# Patient Record
Sex: Male | Born: 1955 | Race: Black or African American | Hispanic: No | Marital: Single | Smoking: Former smoker
Health system: Southern US, Community
[De-identification: ages and names within clinical notes are randomized; demographics above are authoritative.]

## PROBLEM LIST (undated history)

## (undated) DIAGNOSIS — I714 Abdominal aortic aneurysm, without rupture, unspecified: Secondary | ICD-10-CM

## (undated) DIAGNOSIS — S069X9A Unspecified intracranial injury with loss of consciousness of unspecified duration, initial encounter: Secondary | ICD-10-CM

## (undated) DIAGNOSIS — R569 Unspecified convulsions: Secondary | ICD-10-CM

## (undated) DIAGNOSIS — R413 Other amnesia: Secondary | ICD-10-CM

## (undated) DIAGNOSIS — I719 Aortic aneurysm of unspecified site, without rupture: Secondary | ICD-10-CM

## (undated) DIAGNOSIS — F329 Major depressive disorder, single episode, unspecified: Secondary | ICD-10-CM

## (undated) DIAGNOSIS — IMO0001 Reserved for inherently not codable concepts without codable children: Secondary | ICD-10-CM

## (undated) DIAGNOSIS — I517 Cardiomegaly: Secondary | ICD-10-CM

## (undated) DIAGNOSIS — S069XAA Unspecified intracranial injury with loss of consciousness status unknown, initial encounter: Secondary | ICD-10-CM

## (undated) DIAGNOSIS — F1010 Alcohol abuse, uncomplicated: Secondary | ICD-10-CM

## (undated) DIAGNOSIS — M5126 Other intervertebral disc displacement, lumbar region: Secondary | ICD-10-CM

## (undated) DIAGNOSIS — Z5189 Encounter for other specified aftercare: Secondary | ICD-10-CM

## (undated) DIAGNOSIS — R3915 Urgency of urination: Secondary | ICD-10-CM

## (undated) DIAGNOSIS — I10 Essential (primary) hypertension: Secondary | ICD-10-CM

## (undated) DIAGNOSIS — M5410 Radiculopathy, site unspecified: Secondary | ICD-10-CM

## (undated) DIAGNOSIS — F419 Anxiety disorder, unspecified: Secondary | ICD-10-CM

## (undated) DIAGNOSIS — F32A Depression, unspecified: Secondary | ICD-10-CM

## (undated) DIAGNOSIS — E785 Hyperlipidemia, unspecified: Secondary | ICD-10-CM

## (undated) HISTORY — DX: Abdominal aortic aneurysm, without rupture: I71.4

## (undated) HISTORY — PX: POLYPECTOMY: SHX149

## (undated) HISTORY — PX: OTHER SURGICAL HISTORY: SHX169

## (undated) HISTORY — DX: Anxiety disorder, unspecified: F41.9

## (undated) HISTORY — DX: Abdominal aortic aneurysm, without rupture, unspecified: I71.40

## (undated) HISTORY — DX: Depression, unspecified: F32.A

## (undated) HISTORY — DX: Major depressive disorder, single episode, unspecified: F32.9

---

## 1986-06-25 HISTORY — PX: OTHER SURGICAL HISTORY: SHX169

## 1986-06-25 HISTORY — PX: LEG SURGERY: SHX1003

## 1986-06-25 HISTORY — PX: HIP SURGERY: SHX245

## 1998-01-22 ENCOUNTER — Emergency Department (HOSPITAL_COMMUNITY): Admission: EM | Admit: 1998-01-22 | Discharge: 1998-01-22 | Payer: Self-pay | Admitting: Emergency Medicine

## 1998-11-12 ENCOUNTER — Emergency Department (HOSPITAL_COMMUNITY): Admission: EM | Admit: 1998-11-12 | Discharge: 1998-11-12 | Payer: Self-pay | Admitting: *Deleted

## 2001-04-29 ENCOUNTER — Encounter: Payer: Self-pay | Admitting: Emergency Medicine

## 2001-04-29 ENCOUNTER — Emergency Department (HOSPITAL_COMMUNITY): Admission: EM | Admit: 2001-04-29 | Discharge: 2001-04-29 | Payer: Self-pay | Admitting: Emergency Medicine

## 2003-04-04 ENCOUNTER — Inpatient Hospital Stay (HOSPITAL_COMMUNITY): Admission: EM | Admit: 2003-04-04 | Discharge: 2003-04-13 | Payer: Self-pay | Admitting: Emergency Medicine

## 2003-04-04 ENCOUNTER — Encounter: Payer: Self-pay | Admitting: Emergency Medicine

## 2003-04-05 ENCOUNTER — Encounter: Payer: Self-pay | Admitting: Internal Medicine

## 2003-04-05 ENCOUNTER — Encounter: Payer: Self-pay | Admitting: Emergency Medicine

## 2003-07-26 ENCOUNTER — Emergency Department (HOSPITAL_COMMUNITY): Admission: EM | Admit: 2003-07-26 | Discharge: 2003-07-26 | Payer: Self-pay | Admitting: Emergency Medicine

## 2003-10-06 ENCOUNTER — Emergency Department (HOSPITAL_COMMUNITY): Admission: EM | Admit: 2003-10-06 | Discharge: 2003-10-07 | Payer: Self-pay | Admitting: Emergency Medicine

## 2003-12-20 ENCOUNTER — Inpatient Hospital Stay (HOSPITAL_COMMUNITY): Admission: EM | Admit: 2003-12-20 | Discharge: 2004-01-03 | Payer: Self-pay | Admitting: Emergency Medicine

## 2003-12-24 ENCOUNTER — Encounter (INDEPENDENT_AMBULATORY_CARE_PROVIDER_SITE_OTHER): Payer: Self-pay | Admitting: Interventional Cardiology

## 2004-09-21 ENCOUNTER — Ambulatory Visit: Payer: Self-pay | Admitting: Cardiology

## 2004-10-09 ENCOUNTER — Ambulatory Visit: Payer: Self-pay | Admitting: Internal Medicine

## 2004-11-02 ENCOUNTER — Ambulatory Visit: Payer: Self-pay | Admitting: Internal Medicine

## 2004-11-13 ENCOUNTER — Ambulatory Visit: Payer: Self-pay | Admitting: Cardiology

## 2004-11-15 ENCOUNTER — Ambulatory Visit: Payer: Self-pay

## 2004-11-27 ENCOUNTER — Ambulatory Visit: Payer: Self-pay | Admitting: Cardiology

## 2005-11-17 ENCOUNTER — Inpatient Hospital Stay (HOSPITAL_COMMUNITY): Admission: EM | Admit: 2005-11-17 | Discharge: 2005-11-21 | Payer: Self-pay | Admitting: Emergency Medicine

## 2007-09-09 ENCOUNTER — Emergency Department (HOSPITAL_COMMUNITY): Admission: EM | Admit: 2007-09-09 | Discharge: 2007-09-10 | Payer: Self-pay | Admitting: Emergency Medicine

## 2007-10-29 ENCOUNTER — Emergency Department (HOSPITAL_COMMUNITY): Admission: EM | Admit: 2007-10-29 | Discharge: 2007-10-29 | Payer: Self-pay | Admitting: Emergency Medicine

## 2008-10-12 ENCOUNTER — Encounter: Payer: Self-pay | Admitting: Unknown Physician Specialty

## 2008-10-12 ENCOUNTER — Emergency Department (HOSPITAL_COMMUNITY): Admission: EM | Admit: 2008-10-12 | Discharge: 2008-10-12 | Payer: Self-pay | Admitting: Emergency Medicine

## 2008-10-15 ENCOUNTER — Inpatient Hospital Stay (HOSPITAL_COMMUNITY): Admission: EM | Admit: 2008-10-15 | Discharge: 2008-10-17 | Payer: Self-pay | Admitting: Emergency Medicine

## 2010-07-16 ENCOUNTER — Encounter: Payer: Self-pay | Admitting: Unknown Physician Specialty

## 2010-07-17 ENCOUNTER — Encounter: Payer: Self-pay | Admitting: Unknown Physician Specialty

## 2010-10-04 LAB — CBC
HCT: 17.8 % — ABNORMAL LOW (ref 39.0–52.0)
HCT: 22.7 % — ABNORMAL LOW (ref 39.0–52.0)
HCT: 34.3 % — ABNORMAL LOW (ref 39.0–52.0)
Hemoglobin: 11.7 g/dL — ABNORMAL LOW (ref 13.0–17.0)
Hemoglobin: 6.3 g/dL — CL (ref 13.0–17.0)
Hemoglobin: 8 g/dL — ABNORMAL LOW (ref 13.0–17.0)
MCHC: 34.2 g/dL (ref 30.0–36.0)
MCHC: 35.1 g/dL (ref 30.0–36.0)
MCHC: 35.4 g/dL (ref 30.0–36.0)
MCV: 92.1 fL (ref 78.0–100.0)
MCV: 92.1 fL (ref 78.0–100.0)
MCV: 92.9 fL (ref 78.0–100.0)
Platelets: 108 10*3/uL — ABNORMAL LOW (ref 150–400)
Platelets: 114 10*3/uL — ABNORMAL LOW (ref 150–400)
Platelets: 123 10*3/uL — ABNORMAL LOW (ref 150–400)
RBC: 1.92 MIL/uL — ABNORMAL LOW (ref 4.22–5.81)
RBC: 2.46 MIL/uL — ABNORMAL LOW (ref 4.22–5.81)
RBC: 3.72 MIL/uL — ABNORMAL LOW (ref 4.22–5.81)
RDW: 13.9 % (ref 11.5–15.5)
RDW: 14.5 % (ref 11.5–15.5)
RDW: 14.9 % (ref 11.5–15.5)
WBC: 6.3 10*3/uL (ref 4.0–10.5)
WBC: 6.8 10*3/uL (ref 4.0–10.5)
WBC: 7.7 10*3/uL (ref 4.0–10.5)

## 2010-10-04 LAB — RETICULOCYTES
RBC.: 2.75 MIL/uL — ABNORMAL LOW (ref 4.22–5.81)
Retic Count, Absolute: 27.5 10*3/uL (ref 19.0–186.0)
Retic Ct Pct: 1 % (ref 0.4–3.1)

## 2010-10-04 LAB — TYPE AND SCREEN: Antibody Screen: NEGATIVE

## 2010-10-04 LAB — COMPREHENSIVE METABOLIC PANEL
ALT: 61 U/L — ABNORMAL HIGH (ref 0–53)
ALT: 80 U/L — ABNORMAL HIGH (ref 0–53)
ALT: 86 U/L — ABNORMAL HIGH (ref 0–53)
AST: 102 U/L — ABNORMAL HIGH (ref 0–37)
AST: 50 U/L — ABNORMAL HIGH (ref 0–37)
AST: 91 U/L — ABNORMAL HIGH (ref 0–37)
Albumin: 3.3 g/dL — ABNORMAL LOW (ref 3.5–5.2)
Albumin: 3.6 g/dL (ref 3.5–5.2)
Albumin: 3.7 g/dL (ref 3.5–5.2)
Alkaline Phosphatase: 51 U/L (ref 39–117)
Alkaline Phosphatase: 55 U/L (ref 39–117)
Alkaline Phosphatase: 63 U/L (ref 39–117)
BUN: 10 mg/dL (ref 6–23)
BUN: 15 mg/dL (ref 6–23)
BUN: 4 mg/dL — ABNORMAL LOW (ref 6–23)
CO2: 22 mEq/L (ref 19–32)
CO2: 24 mEq/L (ref 19–32)
CO2: 27 mEq/L (ref 19–32)
Calcium: 8.3 mg/dL — ABNORMAL LOW (ref 8.4–10.5)
Calcium: 8.5 mg/dL (ref 8.4–10.5)
Calcium: 8.6 mg/dL (ref 8.4–10.5)
Chloride: 101 mEq/L (ref 96–112)
Chloride: 103 mEq/L (ref 96–112)
Chloride: 104 mEq/L (ref 96–112)
Creatinine, Ser: 0.58 mg/dL (ref 0.4–1.5)
Creatinine, Ser: 0.7 mg/dL (ref 0.4–1.5)
Creatinine, Ser: 1.11 mg/dL (ref 0.4–1.5)
GFR calc Af Amer: >60 mL/min (ref >60)
GFR calc Af Amer: >60 mL/min (ref >60)
GFR calc Af Amer: >60 mL/min (ref >60)
GFR calc non Af Amer: >60 mL/min (ref >60)
GFR calc non Af Amer: >60 mL/min (ref >60)
GFR calc non Af Amer: >60 mL/min (ref >60)
Glucose, Bld: 69 mg/dL — ABNORMAL LOW (ref 70–99)
Glucose, Bld: 93 mg/dL (ref 70–99)
Glucose, Bld: 95 mg/dL (ref 70–99)
Potassium: 4.1 mEq/L (ref 3.5–5.1)
Potassium: 4.2 mEq/L (ref 3.5–5.1)
Potassium: 4.4 mEq/L (ref 3.5–5.1)
Sodium: 133 mEq/L — ABNORMAL LOW (ref 135–145)
Sodium: 135 mEq/L (ref 135–145)
Sodium: 138 mEq/L (ref 135–145)
Total Bilirubin: 0.2 mg/dL — ABNORMAL LOW (ref 0.3–1.2)
Total Bilirubin: 0.6 mg/dL (ref 0.3–1.2)
Total Bilirubin: 1 mg/dL (ref 0.3–1.2)
Total Protein: 5.8 g/dL — ABNORMAL LOW (ref 6.0–8.3)
Total Protein: 6.3 g/dL (ref 6.0–8.3)
Total Protein: 6.3 g/dL (ref 6.0–8.3)

## 2010-10-04 LAB — DIFFERENTIAL
Basophils Absolute: 0 10*3/uL (ref 0.0–0.1)
Basophils Absolute: 0 10*3/uL (ref 0.0–0.1)
Basophils Relative: 0 % (ref 0–1)
Basophils Relative: 0 % (ref 0–1)
Eosinophils Absolute: 0.1 10*3/uL (ref 0.0–0.7)
Eosinophils Absolute: 0.1 10*3/uL (ref 0.0–0.7)
Eosinophils Relative: 1 % (ref 0–5)
Eosinophils Relative: 2 % (ref 0–5)
Lymphocytes Relative: 19 % (ref 12–46)
Lymphocytes Relative: 23 % (ref 12–46)
Lymphs Abs: 1.5 10*3/uL (ref 0.7–4.0)
Lymphs Abs: 1.5 10*3/uL (ref 0.7–4.0)
Monocytes Absolute: 0.8 10*3/uL (ref 0.1–1.0)
Monocytes Absolute: 0.8 10*3/uL (ref 0.1–1.0)
Monocytes Relative: 10 % (ref 3–12)
Monocytes Relative: 13 % — ABNORMAL HIGH (ref 3–12)
Neutro Abs: 3.9 10*3/uL (ref 1.7–7.7)
Neutro Abs: 5.3 10*3/uL (ref 1.7–7.7)
Neutrophils Relative %: 62 % (ref 43–77)
Neutrophils Relative %: 69 % (ref 43–77)

## 2010-10-04 LAB — POCT I-STAT, CHEM 8
BUN: 11 mg/dL (ref 6–23)
Calcium, Ion: 0.93 mmol/L — ABNORMAL LOW (ref 1.12–1.32)
Chloride: 102 mEq/L (ref 96–112)
TCO2: 22 mmol/L (ref 0–100)

## 2010-10-04 LAB — POCT CARDIAC MARKERS
CKMB, poc: <1 ng/mL — ABNORMAL LOW (ref 1.0–8.0)
CKMB, poc: <1 ng/mL — ABNORMAL LOW (ref 1.0–8.0)
Myoglobin, poc: 28.8 ng/mL (ref 12–200)
Myoglobin, poc: 40.9 ng/mL (ref 12–200)
Myoglobin, poc: 77.4 ng/mL (ref 12–200)
Troponin i, poc: <0.05 ng/mL (ref 0.00–0.09)
Troponin i, poc: <0.05 ng/mL (ref 0.00–0.09)

## 2010-10-04 LAB — HEMOGLOBIN AND HEMATOCRIT, BLOOD
HCT: 32.6 % — ABNORMAL LOW (ref 39.0–52.0)
HCT: 33.4 % — ABNORMAL LOW (ref 39.0–52.0)
HCT: 33.4 % — ABNORMAL LOW (ref 39.0–52.0)
HCT: 34.6 % — ABNORMAL LOW (ref 39.0–52.0)
Hemoglobin: 10.7 g/dL — ABNORMAL LOW (ref 13.0–17.0)
Hemoglobin: 11.4 g/dL — ABNORMAL LOW (ref 13.0–17.0)
Hemoglobin: 11.5 g/dL — ABNORMAL LOW (ref 13.0–17.0)

## 2010-10-04 LAB — GLUCOSE, CAPILLARY
Glucose-Capillary: 149 mg/dL — ABNORMAL HIGH (ref 70–99)
Glucose-Capillary: 85 mg/dL (ref 70–99)

## 2010-10-04 LAB — ABO/RH: ABO/RH(D): A POS

## 2010-10-04 LAB — URINALYSIS, ROUTINE W REFLEX MICROSCOPIC
Bilirubin Urine: NEGATIVE
Glucose, UA: 100 mg/dL — AB
Hgb urine dipstick: NEGATIVE
Ketones, ur: NEGATIVE mg/dL
Nitrite: NEGATIVE
Specific Gravity, Urine: 1.012 (ref 1.005–1.030)
pH: 8 (ref 5.0–8.0)

## 2010-10-04 LAB — RAPID URINE DRUG SCREEN, HOSP PERFORMED
Amphetamines: NOT DETECTED
Barbiturates: NOT DETECTED
Benzodiazepines: NOT DETECTED
Cocaine: NOT DETECTED
Opiates: NOT DETECTED
Tetrahydrocannabinol: NOT DETECTED

## 2010-10-04 LAB — AMMONIA: Ammonia: 26 umol/L (ref 11–35)

## 2010-10-04 LAB — BRAIN NATRIURETIC PEPTIDE: Pro B Natriuretic peptide (BNP): 37 pg/mL (ref 0.0–100.0)

## 2010-10-04 LAB — PROTIME-INR
INR: 1 (ref 0.00–1.49)
INR: 1 (ref 0.00–1.49)
INR: 1.1 (ref 0.00–1.49)
Prothrombin Time: 13.2 seconds (ref 11.6–15.2)
Prothrombin Time: 13.7 seconds (ref 11.6–15.2)
Prothrombin Time: 14.7 seconds (ref 11.6–15.2)

## 2010-10-04 LAB — PHENYTOIN LEVEL, TOTAL
Phenytoin Lvl: 23.1 ug/mL — ABNORMAL HIGH (ref 10.0–20.0)
Phenytoin Lvl: 5.4 ug/mL — ABNORMAL LOW (ref 10.0–20.0)

## 2010-10-04 LAB — VITAMIN B12: Vitamin B-12: 231 pg/mL (ref 211–911)

## 2010-10-04 LAB — ETHANOL: Alcohol, Ethyl (B): <5 mg/dL (ref 0–10)

## 2010-10-04 LAB — AFP TUMOR MARKER: AFP-Tumor Marker: 1.9 ng/mL (ref 0.0–8.0)

## 2010-10-04 LAB — HEPATIC FUNCTION PANEL
ALT: 74 U/L — ABNORMAL HIGH (ref 0–53)
AST: 132 U/L — ABNORMAL HIGH (ref 0–37)
Alkaline Phosphatase: 49 U/L (ref 39–117)
Bilirubin, Direct: 0.2 mg/dL (ref 0.0–0.3)
Total Bilirubin: 0.7 mg/dL (ref 0.3–1.2)

## 2010-10-04 LAB — PHOSPHORUS: Phosphorus: 2.8 mg/dL (ref 2.3–4.6)

## 2010-10-04 LAB — FOLATE: Folate: 16.5 ng/mL

## 2010-10-04 LAB — FERRITIN: Ferritin: 136 ng/mL (ref 22–322)

## 2010-10-04 LAB — IRON AND TIBC: Saturation Ratios: 30 % (ref 20–55)

## 2010-11-07 NOTE — Discharge Summary (Signed)
NAMEGRACEN, [name]             ACCOUNT NO.:  [id number]   MEDICAL RECORD NO.:  [id number]          PATIENT TYPE:  INP   LOCATION:  3738                         FACILITY:  [facility]   PHYSICIAN:  Ruthy Dick, MD    DATE OF BIRTH:  1955-08-14   DATE OF ADMISSION:  10/14/2008  DATE OF DISCHARGE:                               DISCHARGE SUMMARY   REASON FOR ADMISSION:  1. Acute blood loss anemia, status post transfusion of 3 units of red      blood cells.  2. Liver laceration, grade 2  3. Hemoperitoneum.  4. Seizure disorder.  5. Subtherapeutic Dilantin level.  6. Altered mental status.  7. Alcohol abuse and impending withdrawal.  8. Descending thoracic aortic aneurysm measuring 4 cm.  9. Hypertension.  10.History of cardiomyopathy.   CONSULTS DURING THIS ADMISSION:  Surgical consult.   PROCEDURES DONE DURING THIS ADMISSION:  Transfusion of 3 units of packed  red blood cells.   BRIEF HISTORY OF PRESENT ILLNESS AND HOSPITAL COURSE:  This is a 53-year  African American male with a past medical history significant for  hypertension, alcohol abuse and substance abuse.  He was brought to the  emergency room with altered mental status and thought that he had had a  seizure.  The patient had come to the emergency room 2 days prior to  presentation with chest pain and at that time, he had been scheduled to  have a CT scan but  because of his chest pain, this was postponed.  During this presentation, workup showed that the patient did have  hemoglobin of 6.3 and also abdominal pain.  The CAT scan revealed that  the patient had hemoperitoneum and also liver laceration was noted as  the likely cause of his bleeding.  The patient had fallen down about 2  days prior to presentation.  Again, there was no information whether  this fall was before or during a seizure episode.  It was noted that the  patient had a low level of Dilantin on presentation as well.   The patient was admitted for  observation and for transfusion of packed  red blood cells.  The patient did well and in the last 2 days, his  hemoglobin has remained stable post transfusion.  His abdominal pain  seems to have almost resolved.  He only has minimal abdominal pain at  this time and his vitals have remained stable all through this  hospitalization.  I have spoken to Dr. Janee Morn today and he thinks that  the patient should be stable enough to go home and follow with the  Trauma Section of  [facility].  As noted, the patient has  had no nausea, no vomiting and has been tolerating orally.  We believe  that he should be able to go home.  We have also advised him to quit  alcohol and seek Alcohol Anonymous in his area to go to.  He apparently  lives in an assisted living facility and he should be able to go back  there today.   FOLLOW-UP:  He is follow with his  primary care physician and Social  Services to assist with finding one.  The aforementioned will be in 1-2  weeks.  He should also call [facility] for an appointment  with the trauma service and the phone number is [phone].  This was as  recommended by Dr. Janee Morn.  We recommend that he avoid contact sports  and avoid lifting heavy objects for at least  2-3 weeks.   The time used for discharge planning is greater than 30 minutes.   DISCHARGE MEDICATIONS:  1. Lisinopril 40 mg daily.  2. Dilantin 150 mg q.h.s.  3. Lortab 5/500 mg p.o. q.6. hours p.r.n., 30 tablets.  4. Librium 0.25 mg p.o. t.i.d. for 2 days than 25 mg p.o. for 2 days,      then 25 mg p.o. daily for 3 days.  5. Thiamine 100 mg p.o. daily for 3 days.  6. Folate 1 mg p.o. daily for 3 days.  7. Multivitamins 1 tablet p.o. daily for 3 days.      Ruthy Dick, MD  Electronically Signed     GU/MEDQ  D:  10/17/2008  T:  10/17/2008  Job:  [id number]   cc:   Gabrielle Dare. Janee Morn, M.D.
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
## 2010-11-07 NOTE — Procedures (Signed)
EEG NUMBER:  [id number]   CLINICAL HISTORY:  A 55 year old admitted October 15, 2008 for acute blood  loss and anemia.  He reports a seizure.  He has history of alcohol  abuse, seizure disorder, abdominal pain, chest pain, hypertension,  cardiomegaly, and history of traumatic brain injury. (780,39)   PROCEDURE:  The tracing is carried out on a 32-channel digital Cadwell  recorder reformatted into 16-channel montages with one devoted to EKG.  The patient was awake and asleep during the recording.  The  International 10/20 system lead placement was used.   MEDICATIONS:  Include Rocephin, folic acid, Protonix, Dilantin, and  Tylenol.   DESCRIPTION OF FINDINGS:  Dominant frequency is 8-9 Hz, 30 microvolt  activity.  Mixed frequency theta and delta range activity were seen as  part drowsy state.  The patient drifts in and out of drowsiness in light  of natural sleep and arouses throughout the record.  There was no focal  slowing.  There was no interictal epileptiform activity in the form of  spikes or sharp waves.  Photic stimulation and hyperventilation caused  no change in background.   EKG showed regular sinus rhythm with ventricular response of 72 beats  per minute.   IMPRESSION:  Normal record with the patient awake and asleep.  The  patient is excessively drowsy, but otherwise study is normal.  No  seizure activity or focal slowing is seen.      Deanna Artis. Sharene Skeans, M.D.  Electronically Signed     UEA:VWUJ  D:  10/15/2008 14:15:45  T:  10/16/2008 02:22:31  Job #:  [id number]
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
## 2010-11-10 NOTE — Discharge Summary (Signed)
NAMEDIONICIO, SHELNUTT                       ACCOUNT NO.:  [id number]   MEDICAL RECORD NO.:  [id number]                   PATIENT TYPE:  INP   LOCATION:  2004                                 FACILITY:  [facility]   PHYSICIAN:  Corinna L. Lendell Caprice, MD             DATE OF BIRTH:  October 06, 1955   DATE OF ADMISSION:  12/20/2003  DATE OF DISCHARGE:  12/29/2003                                 DISCHARGE SUMMARY   ADDENDUM:  The stress Cardiolite showed no reversible ischemia, and an ejection  fraction of 37%.   DISCHARGE MEDICATIONS:  The patient will be discharged on the following  medications, rather than what was dictated in Dr. Rubye Oaks note.  1. Lisinopril 5 mg p.o. daily.  2. Bisoprolol 5 mg one-half tablet p.o. daily.  3. Dilantin 300 mg p.o. q.h.s.   All other discharge data per Dr. Rubye Oaks summary.                                                Corinna L. Lendell Caprice, MD    CLS/MEDQ  D:  12/29/2003  T:  12/30/2003  Job:  [id number]

## 2010-11-10 NOTE — Discharge Summary (Signed)
[name], KRESSE                       ACCOUNT NO.:  [id number]   MEDICAL RECORD NO.:  [id number]                   PATIENT TYPE:  INP   LOCATION:  2004                                 FACILITY:  [facility]   PHYSICIAN:  Jackie Plum, M.D.             DATE OF BIRTH:  Dec 24, 1955   DATE OF ADMISSION:  12/20/2003  DATE OF DISCHARGE:  12/28/2003                           DISCHARGE SUMMARY - REFERRING   ADMISSION DIAGNOSES:  1. 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  To rule out ischemic heart disease     a. A 2-D echocardiogram done on December 24, 2003, notable for moderate to        severe decreased ejection fraction of 25%-35%, severe diffuse left        ventricular hypokinesis, moderate to markedly increased pulmonary        artery systolic pressure with estimated peak pulmonary atrial systolic        pressure of 16 mmHg, mild to moderate tricuspid valvular        regurgitation, inferior vena cava mild regurgitation.     b. An adenosine Cardiolite scheduled for tomorrow, December 28, 2003, by Dr.        Rollene Rotunda, to rule out ischemic heart disease.  2. Rhabdomyolysis, resolved.  3. Recurrent seizure, secondary to noncompliance to anti-seizure medications     and alcoholism.  4. Electrolyte imbalance, resolved.  5. Diabetes mellitus, probable alcohol-related __________ .  6. Alcohol withdrawal, resolved.  7. Hypertension.  8. Normocytic anemia.   DISCHARGE MEDICATIONS:  1. Bisoprolol 2.5 mg p.o. daily.  2. Enalapril 2.5 mg p.o. b.i.d.  3. Multivitamins, one cap p.o. daily.  4. Protonix 40 mg p.o. daily.  5. Dilantin 20 mg p.o. daily.  6. Thiamine 100 mg p.o. daily.  7. Aspirin 81 mg p.o. daily.   ACTIVITY:  As tolerated.   DIET:  Low-salt, 2000 calorie ADA diet.   SPECIAL INSTRUCTIONS:  The patient has been instructed and counseled  regarding his medications.  He has also been counseled to stop abusing  alcohol.   Mentioned to him to consider enrolling in AA, and reinforced  counseling by his primary care physician on a visit to the office was also  recommended.   FOLLOWUP:  The patient will follow up with his primary care physician at  [facility].   CONCLUSION:  Dr. Antoine Poche, cardiology.   PROCEDURE:  The patient is planned for a Cardiolite stress test on December 28, 2003, by cardiology with Dr. Antoine Poche.   REASON FOR ADMISSION:  Recurrent seizures.  Please see the admission H&P  nicely dictated by Dr. [name] of [employer].   HISTORY OF PRESENT ILLNESS:  The patient is a 55 year old gentleman with a  history of a seizure disorder and alcohol abuse, who presented to the  emergency room on account of seizure activity.  He had another seizure while  he was in the emergency room.  His witnessed emergency room seizure was on  the day of admission.  It was said to be more of a generalized seizure  activity.  The patient had been drinking; however, had stopped drinking on  the day prior to admission apparently.   PHYSICAL EXAMINATION:  VITAL SIGNS:  On admission his temperature was 102.2  degrees F, blood pressure 179/118, which later went down to 101/63, heart  rate of 118, later improved to 83 on admission, respirations 20, saturation  90% on room air.  HEENT:  Sclerae anicteric.  His extraocular movements were intact.  NEUROLOGIC:  The patient was said to be groggy, but oriented to year and  month.  He was oriented to place and person as well.  Cranial nerves were  grossly intact.  His power was 5/5 in both arms and legs, with symmetrical  cranial nerve reflexes.  LUNGS:  Unremarkable.  HEART:  Benign.  ABDOMEN:  Benign.   LABORATORY DATA:  A CT of the head obtained in the emergency room was  negative for acute findings.  His white count 6.1, hemoglobin 12.7, hematocrit 37.4, platelet count  84,000.  His i-STAT showed a potassium of 3.1, sodium 151, creatinine  0.6.  SGOT 42.  His liver function tests were otherwise within normal limits.  His  total CPK was 427, normal being 2.9.  His Dilantin level was less than 2.5.  A urine drug screen was negative.  His alcohol level was less than 5.   HOSPITAL COURSE:  He was therefore admitted for evaluation and management  for his recurrent seizure activity.  In view of concerns about possible  alcohol withdrawal seizures, he was admitted to a step-down bed.  __________  cautions were instituted.  He was put on Librium protocol with thiamine and  multivitamins.  He was loaded with Dilantin.  He also received IV fluid  supplementation.  His electrolyte imbalances were appropriately corrected.  His platelet counts were monitored and the discontinuation of alcohol use,  this resolved spontaneously at the time of discharge.  In view of his  alcohol use, his phosphorus was checked which were markedly decreased and  were appropriately corrected as well.  Repeat CPK indicated a total CPK of  more than 2000 and was appropriately treated for rhabdomyolysis with  monitoring of his electrolytes.  The patient also went into alcohol withdrawal and received appropriate  treatment with Librium by protocol.  At the time of discharge this had  resolved, as mentioned above.  During this admission the patient initially was noted to be persistently  tachycardic, and in view of his cardiomegaly on a chest x-ray as well as  trace bipedal edema, I went ahead and ordered an echocardiogram which came  out to indicate systolic dysfunction.  In retrospect, I believe that his  persistent tachycardia was due to his hypophosphatemia, rather than cardiac  etiology.  The patient did not have any chest pain, shortness of breath, or  any significant cardiopulmonary symptoms while he was in the hospital;  however, in view of the fact that the patient is alcoholic, and noncompliant with his medications, I thought it expedient to at least  get a full  evaluation of his cardiac status prior to discharge.  I therefore consulted  Dr. Antoine Poche of cardiology, who agreed to the need for ischemic evaluation.  We both agreed that the most likely etiology for the cardiomegaly is  alcoholic; however, a Cardiolite stress test will  be done to rule out any  ischemic heart disease.  The patient was started on a beta blocker and an  ACE inhibitor.  He will need a Social Work evaluation to assist with his  medications prior to discharge.  Also his Dilantin level is sub-therapeutic.   DISCHARGE PHYSICAL EXAMINATION:  This morning does not have any  cardiopulmonary symptomatology.  He does not have any fever or chills.  He  is alert and appropriate.  His mucous membranes are moist.  He does not have  any jugular venous distention.  His lungs are clear to auscultation.  Cardiac auscultation reveals a regular rate and rhythm without any rubs or  murmurs.  His abdomen is soft, nontender.  He does not have any cords on  examination of his extremities.   DISCHARGE LABORATORY DATA:  A review of his labs this morning indicate a  sodium of 137, potassium 3.6, chloride 104, CO2 of 27, glucose 95, BUN 5,  creatinine 0.8, calcium 8.9, phosphorus of 4.2.  Total CPK 380.  His other  labs include a Dilantin level done today of 4.6.  His other labs done previously indicate a WBC of 5.7, with hemoglobin of  11.5, hematocrit of 33.0, MCV 94.0, platelet count 117.  He also had a beta  natriuretic peptide drawn yesterday which was 759.0.  A TSH yesterday was  1.210.   DISPOSITION:  The patient is therefore going to be kept in the hospital  overnight in anticipation of a Cardiolite in the morning.  We have opted to  give him extra doses of Dilantin today, and will need monitoring of his  Dilantin level as an outpatient by his PCP.  The patient will be seen tomorrow by Dr. [name], and discharged  tomorrow, should there be no further  problems.                                                Jackie Plum, M.D.    GO/MEDQ  D:  12/27/2003  T:  12/27/2003  Job:  [id number]   cc:   [facility]   Rollene Rotunda, M.D.
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
## 2010-11-10 NOTE — Consult Note (Signed)
NAME:  [name], [name]                       ACCOUNT NO.:  [id number]   MEDICAL RECORD NO.:  [id number]                   PATIENT TYPE:  EMS   LOCATION:  MAJO                                 FACILITY:  [facility]   PHYSICIAN:  Pramod P. Pearlean Brownie, MD                 DATE OF BIRTH:  [birth year]   DATE OF CONSULTATION:  04/04/2003  DATE OF DISCHARGE:                                   CONSULTATION   REASON FOR CONSULTATION:  Status epilepticus.   HISTORY OF PRESENT ILLNESS:  The patient is a 55 year old male is brought in  by family with witnessed generalized seizure activity lasting at least 20-25  minutes. The patient was found to be seizing upon arrival in the emergency  department with head jerking to the right with gaze deviation to the right,  and generalized extremity shaking. He was given Ativan 2 mg twice without  immediate relief and subsequently after receiving a total dose of 6 mg of  Ativan his seizures stopped. He has remained unresponsive with left gaze  deviation. He has a known previous history of seizures and has been in the  emergency department  before.  He has a history of longstanding alcohol  abuse as well as cocaine and crack abuse. His family members are not  available at the present time to give a complete history.   PAST MEDICAL HISTORY:  1. Alcohol abuse.  2. Cocaine abuse.  3. Crack abuse.  4. Seizure.   MEDICATIONS:  Not obtainable.   REVIEW OF SYMPTOMS:  Not obtainable.   MEDICATION ALLERGIES AND PAST SURGICAL HISTORY:  Not obtained.   PHYSICAL EXAMINATION:  This is an unkempt middle-age male who is in  distress.  His respiration is suppressed and his pulse rate is 150 per minute, blood  pressure 130/80, respiratory rate 8 per minute.  His saturations are 89% on  100% nonrebreather mask . The patient is unresponsive to painful and noxious  stimuli.  His pupils are both 4 mm and nonreactive.  He has a left gaze preference.  His eyes can move to the  midline and doll's eye movements.  His face  asymmetric.  He is unable to move all four extremities to voluntary  commands. There is trace withdrawal response to noxious stimuli, left side  more than right. Both plantars are downgoing.  Coordination and gait cannot  be tested.   Review of blood gas shows pH of 7.09, PCO2 85, PO2 142.  Temperature is  102.8.  Admission labs, drug screen, and CAT scan are pending at this time.   IMPRESSION:  A 55 year old gentleman with status epilepticus which appears  to be partial onset seizure with secondary generalization. The etiology is  probably alcohol or cocaine related.   PLAN:  The patient needs emergent intubation for protection of his airway,  oxygenation, and treatment for his acidosis.  Intravenous fosphenytoin 20  mg/kg for seizure  treatment. Will use IV propofol drip for sedation if he  gets agitated. Emergent CAT scan of the head to rule out any acute  intracranial lesion.  Seizure  precautions. Blood and urine cultures. If no  obvious source of infection is found and fever remains, he might perhaps be  considered for spinal tap. Will give 50 ml of 50% dextrose given his history  of alcohol abuse.  Will follow the patient in consult, kindly call for  questions.                                               Pramod P. Pearlean Brownie, MD    PPS/MEDQ  D:  04/04/2003  T:  04/04/2003  Job:  [id number]

## 2010-11-10 NOTE — Discharge Summary (Signed)
[name], [name] NO.:  [id number]   MEDICAL RECORD NO.:  [id number]          PATIENT TYPE:  INP   LOCATION:  3001                         FACILITY:  [facility]   PHYSICIAN:  Nelma Rothman, MD   DATE OF BIRTH:  29-Mar-1956   DATE OF ADMISSION:  11/17/2005  DATE OF DISCHARGE:  11/21/2005                                 DISCHARGE SUMMARY   ADDENDUM:  Please note that this is an addendum to previously dictated  discharge summary on Nov 19, 2005.  Please see that dictation for further  details.   ADDITIONAL DISCHARGE DIAGNOSES:  Acute on chronic sinusitis.   INTERIM PROCEDURES:  Chest x-ray done on May 28 demonstrated no active  cardiopulmonary disease.  There was evidence of chronic obstructive  pulmonary disease changes.   LABORATORY DATA:  Phenytoin level on the morning of admission was 15.  Blood  cultures drawn on admission remained no growth to date.  Similarly, blood  cultures drawn on Nov 19, 2005 remained no growth to date.   HOSPITAL COURSE:  It was anticipated that [name] would be discharged  back to his assisted living facility on May 28; however, that afternoon he  spiked a temperature of 101 degrees Fahrenheit.  For that reason, his  discharge was delayed.  Blood cultures and urine culture were sent which  remained no growth to date.  A chest x-ray was also done to evaluate for  etiology of fever which similarly did not demonstrate any evidence of  pneumonia.  However, patient complained of a marked increase in sinus  congestion over baseline.  He does have a history of chronic sinusitis based  on non-contrasted CT findings.  This presentation was felt most likely  consistent with an acute sinusitis flare.  He was started on Avelox with  good results.  He remained afebrile thereafter.  All other cultures remained  negative to date.  In retrospect, his acute sinusitis and febrile illness  likely precipitated his seizure by lowering his seizure  threshold which was  then exacerbated by alcohol use.  He was again reminded to abstain from  alcohol all together.  He will be discharged back to his assisted living  facility later today, Nov 21, 2005 in improved condition.   DISCHARGE MEDICATIONS:  1.  Same as previously dictated with the addition of Avelox 400 mg p.o.      daily for an additional seven days.  2.  Other medications include Dilantin 250 mg p.o. daily which is a      decreased dose.  3.  Valium 5 mg p.o. b.i.d. for an additional two days which would be      through May 31, and then 5 mg p.o. once daily for two more days, June 1      and June 2 and then stop.  4.  Lisinopril 40 mg p.o. daily.  5.  Zebeta 10 mg p.o. daily.   DISCHARGE INSTRUCTIONS:  Please see previously dictated discharge  instructions with regard to checking a Dilantin level and following up with  his regular primary care physician.  Also,  I would recommend referral to an  ENT specialist with regard to treatment of his chronic sinusitis as this  seems to be quite bothersome to him on a chronic basis as well.      Nelma Rothman, MD     RAR/MEDQ  D:  11/21/2005  T:  11/21/2005  Job:  [id number]

## 2010-11-10 NOTE — Discharge Summary (Signed)
[name], [name]                       ACCOUNT NO.:  [id number]   MEDICAL RECORD NO.:  [id number]                   PATIENT TYPE:  INP   LOCATION:  3002                                 FACILITY:  [facility]   PHYSICIAN:  Corinna L. Lendell Caprice, MD             DATE OF BIRTH:  [birth year]   DATE OF ADMISSION:  12/20/2003  DATE OF DISCHARGE:  01/03/2004                                 DISCHARGE SUMMARY   ADDENDUM   The patient's transfer to assisted living was held as patient spiked fevers  up to 103.  UA, urine culture, and chest x-rays were negative, but blood  cultures two out of two were positive for pansensitive E. Coli.  One of the  cultures had very scant growth of gram-positive cocci in clusters which is a  staph species and I believe is contaminant.  The patient was started  empirically on Rocephin initially and is being discharged on ampicillin for  five more days.  At the time of discharge, patient was afebrile, had normal  vital signs, was ambulating fine, feeling well.  He was alert and oriented  but continued to be forgetful with poor short-term memory.  I suspect this  may be his baseline mental status.  He will be on the following medications:  Ampicillin 250 mg p.o. q.6h. for five more days, bisoprolol 2.5 mg p.o.  daily, lisinopril 5 mg p.o. daily, Dilantin 300 mg p.o. daily.  Please see  previous discharge summary and addendum for further details.                                                Corinna L. Lendell Caprice, MD    CLS/MEDQ  D:  01/03/2004  T:  01/03/2004  Job:  [id number]   cc:   Fax to [phone]

## 2010-12-14 ENCOUNTER — Inpatient Hospital Stay (INDEPENDENT_AMBULATORY_CARE_PROVIDER_SITE_OTHER)
Admission: RE | Admit: 2010-12-14 | Discharge: 2010-12-14 | Disposition: A | Discharge status: Home / Self Care | Payer: Medicaid Other | Source: Ambulatory Visit | Attending: Family Medicine | Admitting: Family Medicine

## 2010-12-14 ENCOUNTER — Ambulatory Visit (INDEPENDENT_AMBULATORY_CARE_PROVIDER_SITE_OTHER): Payer: Medicaid Other

## 2010-12-14 DIAGNOSIS — H1130 Conjunctival hemorrhage, unspecified eye: Secondary | ICD-10-CM

## 2010-12-14 DIAGNOSIS — S5000XA Contusion of unspecified elbow, initial encounter: Secondary | ICD-10-CM

## 2010-12-14 LAB — GLUCOSE, CAPILLARY: Glucose-Capillary: 116 mg/dL — ABNORMAL HIGH (ref 70–99)

## 2011-03-19 LAB — ETHANOL: Alcohol, Ethyl (B): 411

## 2011-03-21 LAB — POCT I-STAT, CHEM 8
BUN: 9
Calcium, Ion: 1.03 — ABNORMAL LOW
Chloride: 106
Creatinine, Ser: 1
Glucose, Bld: 97
HCT: 46
Hemoglobin: 15.6
Potassium: 4.3
Sodium: 143
TCO2: 29

## 2011-03-21 LAB — ETHANOL: Alcohol, Ethyl (B): 316 — ABNORMAL HIGH

## 2011-03-21 LAB — PHENYTOIN LEVEL, TOTAL: Phenytoin Lvl: 8.8 — ABNORMAL LOW

## 2011-10-04 ENCOUNTER — Encounter (HOSPITAL_COMMUNITY): Payer: Self-pay | Admitting: Emergency Medicine

## 2011-10-04 ENCOUNTER — Telehealth (HOSPITAL_COMMUNITY): Payer: Self-pay | Admitting: *Deleted

## 2011-10-04 ENCOUNTER — Emergency Department (INDEPENDENT_AMBULATORY_CARE_PROVIDER_SITE_OTHER)
Admission: EM | Admit: 2011-10-04 | Discharge: 2011-10-04 | Disposition: A | Discharge status: Home / Self Care | Payer: Medicaid Other | Source: Home / Self Care | Attending: Emergency Medicine | Admitting: Emergency Medicine

## 2011-10-04 DIAGNOSIS — Z760 Encounter for issue of repeat prescription: Secondary | ICD-10-CM

## 2011-10-04 DIAGNOSIS — M549 Dorsalgia, unspecified: Secondary | ICD-10-CM

## 2011-10-04 HISTORY — DX: Unspecified intracranial injury with loss of consciousness of unspecified duration, initial encounter: S06.9X9A

## 2011-10-04 HISTORY — DX: Other amnesia: R41.3

## 2011-10-04 HISTORY — DX: Unspecified convulsions: R56.9

## 2011-10-04 HISTORY — DX: Unspecified intracranial injury with loss of consciousness status unknown, initial encounter: S06.9XAA

## 2011-10-04 HISTORY — DX: Alcohol abuse, uncomplicated: F10.10

## 2011-10-04 HISTORY — DX: Reserved for inherently not codable concepts without codable children: IMO0001

## 2011-10-04 HISTORY — DX: Cardiomegaly: I51.7

## 2011-10-04 HISTORY — DX: Essential (primary) hypertension: I10

## 2011-10-04 HISTORY — DX: Encounter for other specified aftercare: Z51.89

## 2011-10-04 LAB — COMPREHENSIVE METABOLIC PANEL
Albumin: 4.3 g/dL (ref 3.5–5.2)
Alkaline Phosphatase: 74 U/L (ref 39–117)
BUN: 20 mg/dL (ref 6–23)
Potassium: 4.4 mEq/L (ref 3.5–5.1)
Sodium: 137 mEq/L (ref 135–145)
Total Protein: 7.8 g/dL (ref 6.0–8.3)

## 2011-10-04 LAB — RAPID URINE DRUG SCREEN, HOSP PERFORMED
Amphetamines: NOT DETECTED
Barbiturates: NOT DETECTED
Benzodiazepines: NOT DETECTED
Tetrahydrocannabinol: NOT DETECTED

## 2011-10-04 LAB — PHENYTOIN LEVEL, TOTAL: Phenytoin Lvl: <2.5 ug/mL — ABNORMAL LOW (ref 10.0–20.0)

## 2011-10-04 MED ORDER — IBUPROFEN 600 MG PO TABS: 600.0000 mg | ORAL_TABLET | Freq: Four times a day (QID) | ORAL | Status: AC | PRN

## 2011-10-04 MED ORDER — IBUPROFEN 800 MG PO TABS
800.0000 mg | ORAL_TABLET | Freq: Once | ORAL | Status: AC
  Administered 2011-10-04: 800 mg via ORAL

## 2011-10-04 MED ORDER — IBUPROFEN 800 MG PO TABS
ORAL_TABLET | ORAL | Status: AC
  Filled 2011-10-04: qty 1

## 2011-10-04 MED ORDER — OMEPRAZOLE 20 MG PO CPDR: 20.0000 mg | DELAYED_RELEASE_CAPSULE | Freq: Every day | ORAL | Status: DC

## 2011-10-04 MED ORDER — PHENYTOIN SODIUM EXTENDED 100 MG PO CAPS: 200.0000 mg | ORAL_CAPSULE | Freq: Every day | ORAL | Status: DC

## 2011-10-04 MED ORDER — ONE-DAILY MULTI VITAMINS PO TABS: 1.0000 | ORAL_TABLET | Freq: Every day | ORAL | Status: DC

## 2011-10-04 MED ORDER — VITAMIN B-1 100 MG PO TABS: 100.0000 mg | ORAL_TABLET | Freq: Every day | ORAL | Status: DC

## 2011-10-04 MED ORDER — PHENYTOIN 50 MG PO CHEW: 50.0000 mg | CHEWABLE_TABLET | Freq: Once | ORAL | Status: DC

## 2011-10-04 NOTE — ED Notes (Signed)
Spoke to patient and sister about lab results returning

## 2011-10-04 NOTE — ED Notes (Signed)
Advised by Dr. Chaney Malling that patient's dilantin level in 0.  Called pt's sister, [name], and instructed her to fill the pts Dilantin rx and to take it as prescribed.  Instructed pt to find a PCP and have dilantin level check within one month, per Dr. Chaney Malling instruction.  Sister states she is trying to get an appointment at [facility].  Instructed to call back with any problems/questions.

## 2011-10-04 NOTE — ED Notes (Signed)
Given coke, gingerale, crackers and peanut butter
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
## 2011-10-04 NOTE — Discharge Instructions (Signed)
His blood pressure is normal today, so I am not restarting his blood pressure medicine. Keep a log of his blood pressures. I am refilling his Dilantin prescription, but do not fill this and do not start giving this to him until we get his Dilantin level back. He needs to be seen by a primary care physician and/or a neurologist for ongoing management of his medical problems. Given the ibuprofen up to 4 times a day as needed for pain. You may give him 1 g of Tylenol with this. This is an effective combination for pain. Try to keep an eye on what seems to make it better, and what seems to make it worse. This is most likely caused by his recent change in orthotics and use of the cane. Return to the ER if he has a fever above 100.4, his pain gets worse, or for any other concerns.  Go to www.goodrx.com to look up your medications. This will give you a list of where you can find your prescriptions at the most affordable prices.   RESOURCE GUIDE  No Primary Care Doctor Call [facility]  [phone] Other agencies that provide inexpensive medical care    [facility]  [phone]    [facility]  [phone]    [facility]  [phone]    [facility]  [phone] [address] [place] [address]    [facility]  [phone]    [facility]  [phone] [facility] [phone]   [address], [place] [address] [place], [place] [address]   [facility]  [facility]     [employer]                          [facility] Dept. [address] [place]                       [address]      [address] [place] [address]   [phone] (After Hours)  Medication Refill, Emergency Department We have refilled your medication today as a courtesy to you. It is best for your medical care, however, to take care of getting refills done through your primary caregiver's office. They have your  records and can do a better job of follow-up than we can in the emergency department. On maintenance medications, we often only prescribe enough medications to get you by until you are able to see your regular caregiver. This is a more expensive way to refill medications. In the future, please plan for refills so that you will not have to use the emergency department for this. Thank you for your help. Your help allows [place] to better take care of the daily emergencies that enter our department. Document Released: 09/28/2003 Document Revised: 05/31/2011 Document Reviewed: 06/11/2005 [facility] Patient Information 2012 [place], [place].

## 2011-10-04 NOTE — ED Notes (Signed)
Patient is living with sister since last Friday.  Sister does not have medicines.  Patient has [facility] on medicaid card, but has not been seen by them.  opc unable to take patient at this time.  Patient's sister has list that patient was on one year ago.  But since then, patient was living with another family member that has not been cooperative in providing physician information, medicines, etc.  Patient eventually will return to assisted living, but currently with his sister and in need of routine medicines.  Also patient has right side pain that started today.  Reports last bm was this am.  Denies diarrhea, denies vomiting.  No uri symptoms.

## 2011-10-18 ENCOUNTER — Emergency Department (HOSPITAL_COMMUNITY): Payer: Medicaid Other

## 2011-10-18 ENCOUNTER — Encounter (HOSPITAL_COMMUNITY): Payer: Self-pay | Admitting: Emergency Medicine

## 2011-10-18 ENCOUNTER — Encounter (HOSPITAL_COMMUNITY): Payer: Self-pay | Admitting: *Deleted

## 2011-10-18 ENCOUNTER — Inpatient Hospital Stay (HOSPITAL_COMMUNITY)
Admission: EM | Admit: 2011-10-18 | Discharge: 2011-10-19 | DRG: 103 | Disposition: A | Discharge status: Home / Self Care | Payer: Medicaid Other | Attending: Internal Medicine | Admitting: Internal Medicine

## 2011-10-18 ENCOUNTER — Emergency Department (HOSPITAL_COMMUNITY)
Admission: EM | Admit: 2011-10-18 | Discharge: 2011-10-18 | Disposition: A | Discharge status: Nursing Home (Includes ICF) | Payer: Medicaid Other | Source: Home / Self Care | Attending: Emergency Medicine | Admitting: Emergency Medicine

## 2011-10-18 DIAGNOSIS — R569 Unspecified convulsions: Secondary | ICD-10-CM | POA: Diagnosis present

## 2011-10-18 DIAGNOSIS — R51 Headache: Principal | ICD-10-CM | POA: Diagnosis present

## 2011-10-18 DIAGNOSIS — E871 Hypo-osmolality and hyponatremia: Secondary | ICD-10-CM | POA: Diagnosis present

## 2011-10-18 DIAGNOSIS — F1010 Alcohol abuse, uncomplicated: Secondary | ICD-10-CM | POA: Diagnosis present

## 2011-10-18 DIAGNOSIS — F1011 Alcohol abuse, in remission: Secondary | ICD-10-CM

## 2011-10-18 DIAGNOSIS — I10 Essential (primary) hypertension: Secondary | ICD-10-CM | POA: Diagnosis present

## 2011-10-18 DIAGNOSIS — R260 Ataxic gait: Secondary | ICD-10-CM

## 2011-10-18 DIAGNOSIS — Z79899 Other long term (current) drug therapy: Secondary | ICD-10-CM

## 2011-10-18 DIAGNOSIS — E878 Other disorders of electrolyte and fluid balance, not elsewhere classified: Secondary | ICD-10-CM

## 2011-10-18 DIAGNOSIS — Z87820 Personal history of traumatic brain injury: Secondary | ICD-10-CM

## 2011-10-18 DIAGNOSIS — F17200 Nicotine dependence, unspecified, uncomplicated: Secondary | ICD-10-CM | POA: Diagnosis present

## 2011-10-18 DIAGNOSIS — Z720 Tobacco use: Secondary | ICD-10-CM

## 2011-10-18 DIAGNOSIS — R269 Unspecified abnormalities of gait and mobility: Secondary | ICD-10-CM | POA: Diagnosis present

## 2011-10-18 DIAGNOSIS — S069X9A Unspecified intracranial injury with loss of consciousness of unspecified duration, initial encounter: Secondary | ICD-10-CM

## 2011-10-18 LAB — URINALYSIS, ROUTINE W REFLEX MICROSCOPIC
Glucose, UA: NEGATIVE mg/dL
Ketones, ur: NEGATIVE mg/dL
Leukocytes, UA: NEGATIVE
pH: 6 (ref 5.0–8.0)

## 2011-10-18 LAB — CBC
HCT: 43.2 % (ref 39.0–52.0)
Hemoglobin: 14.6 g/dL (ref 13.0–17.0)
MCHC: 33.8 g/dL (ref 30.0–36.0)
WBC: 10.1 10*3/uL (ref 4.0–10.5)

## 2011-10-18 LAB — COMPREHENSIVE METABOLIC PANEL
BUN: 17 mg/dL (ref 6–23)
CO2: 26 mEq/L (ref 19–32)
Chloride: 86 mEq/L — ABNORMAL LOW (ref 96–112)
Creatinine, Ser: 0.69 mg/dL (ref 0.50–1.35)
GFR calc non Af Amer: >90 mL/min (ref >90)
Glucose, Bld: 119 mg/dL — ABNORMAL HIGH (ref 70–99)
Total Bilirubin: 0.3 mg/dL (ref 0.3–1.2)

## 2011-10-18 LAB — DIFFERENTIAL
Lymphocytes Relative: 18 % (ref 12–46)
Lymphs Abs: 1.8 10*3/uL (ref 0.7–4.0)
Monocytes Absolute: 1.3 10*3/uL — ABNORMAL HIGH (ref 0.1–1.0)
Monocytes Relative: 13 % — ABNORMAL HIGH (ref 3–12)
Neutro Abs: 7 10*3/uL (ref 1.7–7.7)

## 2011-10-18 LAB — PHENYTOIN LEVEL, TOTAL: Phenytoin Lvl: 9 ug/mL — ABNORMAL LOW (ref 10.0–20.0)

## 2011-10-18 LAB — PROCALCITONIN: Procalcitonin: <0.1 ng/mL

## 2011-10-18 MED ORDER — ONDANSETRON HCL 4 MG/2ML IJ SOLN: 4.0000 mg | Freq: Four times a day (QID) | INTRAMUSCULAR | Status: DC | PRN

## 2011-10-18 MED ORDER — ENOXAPARIN SODIUM 40 MG/0.4ML ~~LOC~~ SOLN
40.0000 mg | SUBCUTANEOUS | Status: DC
  Filled 2011-10-18: qty 0.4

## 2011-10-18 MED ORDER — PHENYTOIN SODIUM EXTENDED 100 MG PO CAPS
200.0000 mg | ORAL_CAPSULE | Freq: Every day | ORAL | Status: DC
  Administered 2011-10-19: 200 mg via ORAL
  Filled 2011-10-18 (×3): qty 2

## 2011-10-18 MED ORDER — SODIUM CHLORIDE 0.9 % IJ SOLN
3.0000 mL | Freq: Two times a day (BID) | INTRAMUSCULAR | Status: DC
  Administered 2011-10-19: 3 mL via INTRAVENOUS

## 2011-10-18 MED ORDER — PANTOPRAZOLE SODIUM 40 MG PO TBEC
40.0000 mg | DELAYED_RELEASE_TABLET | Freq: Every day | ORAL | Status: DC
  Administered 2011-10-19: 40 mg via ORAL
  Filled 2011-10-18: qty 1

## 2011-10-18 MED ORDER — METOPROLOL TARTRATE 25 MG PO TABS
25.0000 mg | ORAL_TABLET | Freq: Two times a day (BID) | ORAL | Status: DC
  Administered 2011-10-19 (×2): 25 mg via ORAL
  Filled 2011-10-18 (×3): qty 1

## 2011-10-18 MED ORDER — PHENYTOIN 50 MG PO CHEW
50.0000 mg | CHEWABLE_TABLET | Freq: Every day | ORAL | Status: DC
  Administered 2011-10-19: 50 mg via ORAL
  Filled 2011-10-18 (×2): qty 1

## 2011-10-18 MED ORDER — VITAMIN B-1 100 MG PO TABS
100.0000 mg | ORAL_TABLET | Freq: Every day | ORAL | Status: DC
  Administered 2011-10-19: 100 mg via ORAL
  Filled 2011-10-18: qty 1

## 2011-10-18 MED ORDER — ALUM & MAG HYDROXIDE-SIMETH 200-200-20 MG/5ML PO SUSP: 30.0000 mL | Freq: Four times a day (QID) | ORAL | Status: DC | PRN

## 2011-10-18 MED ORDER — SODIUM CHLORIDE 0.9 % IV SOLN
INTRAVENOUS | Status: AC
  Administered 2011-10-18: 20:00:00 via INTRAVENOUS

## 2011-10-18 MED ORDER — ONDANSETRON HCL 4 MG PO TABS: 4.0000 mg | ORAL_TABLET | Freq: Four times a day (QID) | ORAL | Status: DC | PRN

## 2011-10-18 MED ORDER — ACETAMINOPHEN 325 MG PO TABS
650.0000 mg | ORAL_TABLET | Freq: Once | ORAL | Status: AC
  Administered 2011-10-18: 650 mg via ORAL
  Filled 2011-10-18: qty 2
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
## 2011-10-18 NOTE — ED Notes (Signed)
He  Also  Reports  Symptoms  Of  Urinary  Frequency   As  Well

## 2011-10-18 NOTE — ED Notes (Signed)
Pt  Has  Multiple  Complaints  -  He  Has  Symptoms  Of  dizzyness       Had  Fever  Earlier  He  Also  Reports  Symptoms  Of a  Headache       And  Vague  abd  Pain   -    He  Has  An appt  Next  Month  With [facility]    -  HE  TAKES  DILANTIN  HE  IS  SITTING UPRIGHT  IN WHEELCAIR  HE  IS  SPEAKING IN  COMPLETE  SENTANCES  HIS  FAMILY IS  AT THE  BEDSIDE

## 2011-10-18 NOTE — ED Notes (Signed)
Called to inform bed placement about the need to be near the nurses station.  Fall risk

## 2011-10-18 NOTE — ED Notes (Signed)
Sister is at the bedside and claimed that the patient has been off his seizure medicine and BP medicines for a while. She took him to the Urgent and was sent here for further evaluation. Sister also claimed that the patient has a hx of alcohol abuse and has not had a drink since the 5th. And today the patient has been shaking, weak, with decrease appetite, unbalanced with his walking and has been incontinent.

## 2011-10-18 NOTE — ED Notes (Signed)
Informed sister we are just waiting on a room assignment then he will be able to go to the floor
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
## 2011-10-18 NOTE — ED Notes (Addendum)
Per pt, has headache- went to [facility] - reports balance has been off, and having abd pain, and decreased appetite, reports fevers, fatigue, and has had urinary incontinence (which family reports is not his norm); per sister, headache started last night, other symptoms started this AM at 7; sister reports he has been livign with her since apr 5th and does report he does have some hx of ETOH abuse; pt a&oX4, grips equal, face symmetrical; pt resp e/u, NAD; pt reports lower abd pain- pt denies dysuria but reports urinary frequency; pt's sister reports history of seizures but has not noticed any seizures since being placed back on meds on apr 11th

## 2011-10-18 NOTE — ED Notes (Signed)
cALLED  TO  EXAM  ROOM  BECAUSE FAMILY  MEMBER  REPORTS  L  ARM  TWITCHING    - PT  REMAINS  ALERT   C/O  HEADACHE  NO  SEIZURE  ACTIVITY  NOTED

## 2011-10-18 NOTE — ED Notes (Signed)
Pt's daughter given cold sodas, Happy Meal, pretzels, cheese stick and crackers. No other needs voiced at this time.
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
## 2011-10-19 ENCOUNTER — Inpatient Hospital Stay (HOSPITAL_COMMUNITY): Payer: Medicaid Other

## 2011-10-19 LAB — BASIC METABOLIC PANEL
BUN: 14 mg/dL (ref 6–23)
CO2: 27 mEq/L (ref 19–32)
Glucose, Bld: 87 mg/dL (ref 70–99)
Potassium: 4.6 mEq/L (ref 3.5–5.1)
Sodium: 130 mEq/L — ABNORMAL LOW (ref 135–145)

## 2011-10-19 LAB — CBC
Hemoglobin: 14 g/dL (ref 13.0–17.0)
Hemoglobin: 15.2 g/dL (ref 13.0–17.0)
MCH: 31.7 pg (ref 26.0–34.0)
MCH: 31.9 pg (ref 26.0–34.0)
MCHC: 33.2 g/dL (ref 30.0–36.0)
MCHC: 33.9 g/dL (ref 30.0–36.0)
MCV: 94.3 fL (ref 78.0–100.0)
MCV: 95.5 fL (ref 78.0–100.0)
Platelets: 209 10*3/uL (ref 150–400)
RBC: 4.42 MIL/uL (ref 4.22–5.81)
RBC: 4.76 MIL/uL (ref 4.22–5.81)

## 2011-10-19 LAB — TSH: TSH: 1.209 u[IU]/mL (ref 0.350–4.500)

## 2011-10-19 LAB — AMMONIA: Ammonia: 49 umol/L (ref 11–60)

## 2011-10-19 LAB — OSMOLALITY: Osmolality: 271 mOsm/kg — ABNORMAL LOW (ref 275–300)

## 2011-10-19 MED ORDER — METOPROLOL TARTRATE 25 MG PO TABS: 25.0000 mg | ORAL_TABLET | Freq: Two times a day (BID) | ORAL | Status: DC

## 2011-10-19 MED ORDER — PHENYTOIN SODIUM EXTENDED 100 MG PO CAPS: 300.0000 mg | ORAL_CAPSULE | Freq: Every day | ORAL | Status: DC
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
## 2011-10-19 NOTE — Progress Notes (Signed)
   CARE MANAGEMENT NOTE 10/19/2011  Patient:  MARTHA, SOLTYS   Account Number:  [id number]  Date Initiated:  10/19/2011  Documentation initiated by:  Donn Pierini  Subjective/Objective Assessment:   Pt admitted with hyponatremia     Action/Plan:   PTA pt lived at home with sister-  PT eval   Anticipated DC Date:  10/19/2011   Anticipated DC Plan:  HOME/SELF CARE      DC Planning Services  CM consult      Choice offered to / List presented to:             Status of service:  Completed, signed off Medicare Important Message given?   (If response is "NO", the following Medicare IM given date fields will be blank) Date Medicare IM given:   Date Additional Medicare IM given:    Discharge Disposition:  HOME/SELF CARE  Per UR Regulation:  Reviewed for med. necessity/level of care/duration of stay  If discussed at Long Length of Stay Meetings, dates discussed:    Comments:  10/19/11- 1500- Donn Pierini RN BSN [phone] Pt to discharge home with sister, no discharge needs.
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
## 2011-10-19 NOTE — Progress Notes (Signed)
Utilization Review Completed.Samyrah Bruster T4/26/2013   

## 2011-10-19 NOTE — Progress Notes (Signed)
[name] to be D/C'd Home per MD order.  Discussed prescriptions and follow up appointments with the patient. Prescriptions given to patient, medication list explained in detail. Pt verbalized understanding.   [name], [name]  Home Medication Instructions [id number]   Printed on:10/19/11 1443  Medication Information                    Multiple Vitamin (MULITIVITAMIN WITH MINERALS) TABS Take 1 tablet by mouth daily.           omeprazole (PRILOSEC) 20 MG capsule Take 20 mg by mouth daily.           thiamine (VITAMIN B-1) 100 MG tablet Take 100 mg by mouth daily.           metoprolol tartrate (LOPRESSOR) 25 MG tablet Take 1 tablet (25 mg total) by mouth 2 (two) times daily.           phenytoin (DILANTIN) 100 MG ER capsule Take 3 capsules (300 mg total) by mouth at bedtime.             Filed Vitals:   10/19/11 0626  BP: 160/94  Pulse: 73  Temp: 99 F (37.2 C)  Resp: 17    Skin clean, dry and intact without evidence of skin break down, no evidence of skin tears noted. IV catheter discontinued intact. Site without signs and symptoms of complications. Dressing and pressure applied. Pt denies pain at this time. No complaints noted.  An After Visit Summary was printed and given to the patient. Patient escorted via WC, and D/C home via private auto.  Driggers, Rae Roam 10/19/2011 2:43 PM

## 2011-10-23 ENCOUNTER — Ambulatory Visit (INDEPENDENT_AMBULATORY_CARE_PROVIDER_SITE_OTHER): Payer: Medicaid Other | Admitting: Internal Medicine

## 2011-10-23 ENCOUNTER — Encounter: Payer: Self-pay | Admitting: Internal Medicine

## 2011-10-23 VITALS — BP 124/71 | HR 90 | Temp 97.9°F | Ht 66.0 in | Wt 116.2 lb

## 2011-10-23 DIAGNOSIS — E871 Hypo-osmolality and hyponatremia: Secondary | ICD-10-CM

## 2011-10-23 DIAGNOSIS — R569 Unspecified convulsions: Secondary | ICD-10-CM

## 2011-10-23 DIAGNOSIS — E785 Hyperlipidemia, unspecified: Secondary | ICD-10-CM

## 2011-10-23 DIAGNOSIS — F329 Major depressive disorder, single episode, unspecified: Secondary | ICD-10-CM

## 2011-10-23 LAB — LIPID PANEL
LDL Cholesterol: 113 mg/dL — ABNORMAL HIGH (ref 0–99)
Triglycerides: 164 mg/dL — ABNORMAL HIGH (ref <150)

## 2011-10-23 MED ORDER — SERTRALINE HCL 25 MG PO TABS: 25.0000 mg | ORAL_TABLET | Freq: Every day | ORAL | Status: DC
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
## 2011-10-23 NOTE — Patient Instructions (Signed)
Please, follow up in 2 months. Please fill in a prescription for Zoloft and start taking one tablet by mouth at lunch time. Please, call with any questions.

## 2011-10-24 ENCOUNTER — Telehealth: Payer: Self-pay | Admitting: Licensed Clinical Social Worker

## 2011-10-24 LAB — BASIC METABOLIC PANEL WITH GFR
BUN: 16 mg/dL (ref 6–23)
Calcium: 9.6 mg/dL (ref 8.4–10.5)
GFR, Est African American: >89 mL/min
Glucose, Bld: 101 mg/dL — ABNORMAL HIGH (ref 70–99)
Sodium: 139 mEq/L (ref 135–145)

## 2011-10-24 LAB — VITAMIN D 25 HYDROXY (VIT D DEFICIENCY, FRACTURES): Vit D, 25-Hydroxy: 27 ng/mL — ABNORMAL LOW (ref 30–89)

## 2011-10-24 LAB — PHENYTOIN LEVEL, TOTAL: Phenytoin Lvl: 10 ug/mL (ref 10.0–20.0)
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
## 2011-10-27 ENCOUNTER — Telehealth: Payer: Self-pay | Admitting: Internal Medicine

## 2011-10-27 NOTE — Telephone Encounter (Signed)
Patient's sister called concerning about patient being more sleepy- after starting on Zoloft on Wednesday. He was seen by Dr. Denton Meek on October 23 2011 when he was started on it. He was admitted in April 2013 when he was started on metoprolol and his dose of phenytoin was increased to 300 mg at bedtime. Patient does not have any fever, chills, nausea vomiting or abdominal pain, diarrhea, chest pain, short of breath. He does not report have any focal weakness or numbness.  He is more sleepy after breakfast but arousable. - Advised to stop Zoloft for now and continue metoprolol and phenytoin.  - If patient's with status worsens acutely or has focal neurological symptoms- advised to go to ER or call [phone] pager. Patient's sister verbalized understanding

## 2011-11-01 ENCOUNTER — Telehealth: Payer: Self-pay | Admitting: Licensed Clinical Social Worker

## 2011-11-01 DIAGNOSIS — I10 Essential (primary) hypertension: Secondary | ICD-10-CM

## 2011-11-01 MED ORDER — ATENOLOL 25 MG PO TABS: 25.0000 mg | ORAL_TABLET | Freq: Every day | ORAL | Status: DC

## 2011-11-01 NOTE — Telephone Encounter (Signed)
Sister informed and voices understanding.  I scheduled an appointment for 5/21 to evaluate BP do to change in meds.

## 2011-11-01 NOTE — Telephone Encounter (Signed)
Such a reaction has also been described with certain beta blockers including metoprolol.  A deep sleep at night is important for everyone, but clearly during the day it is counterproductive.  I will discontinue the metoprolol in case this is the medication that is causing [name]'s symptoms and replace it with atenolol to be taken before bed.  The dose I am starting is Atenolol 25 mg PO QHS in place of the metoprolol 25 mg PO BID.  Please call Mr. Baquera's sister with this information.  Thank You.

## 2011-11-01 NOTE — Telephone Encounter (Signed)
Call from pt's sister stating she is concerned with pt's reaction to new Blood Pressure meds. She notes  pt becomes sleepy after about 1 hour of taking med, also gets a headache and does not feeling well.  After he gets up he is better.  He takes night med at 7:30 and again he gets sleepy and goes to bed. The sleep is a very deep sleep and this behavior is unusual for him. Normally very active. Onset 4/26 with new med. Also read a note from Dr Allena Katz on 5/4 where she voiced same complaints and  Zoloft was stopped.  No improvement with this change.

## 2011-11-01 NOTE — Telephone Encounter (Signed)
CSW received call from pt's sister, requesting assistance with petitioning for guardianship for her brother, Mr. Dugar.  Family has received information from the Dept of Adult Services.  Pt will need an evaluation.  Referred family to [facility] or [employer] [facility] [place].  Sister states pt has been to [facility] in the past and was seeing a therapist while in the Assisted Living Facility.  Sister called [facility] to inquire about program, pt is one year too young for PACE referral.  Sister to call previous ALF to obtain name of agency pt was getting treatment from and schedule appt with previous agency or [employer].  CSW placed call and email to Adult Services program director to obtain information on the guardianship process to assist family.

## 2011-11-02 ENCOUNTER — Encounter: Payer: Medicaid Other | Admitting: Internal Medicine

## 2011-11-06 ENCOUNTER — Telehealth: Payer: Self-pay | Admitting: Licensed Clinical Social Worker

## 2011-11-06 NOTE — Telephone Encounter (Signed)
CSW received call from pt's sister, Mervil Wacker requesting assistance with completing guardianship petition.  CSW returned call to sister.  CSW walked through [employer] and [employer].  CSW encouraged sister to contact DSS [phone], on-call guardianship social worker if she should need additional assistance.  CSW informed, Ms. Kowaleski may want to include pt's H&P with petition.  Ms. Butterfield aware CSW is available to assist as needed.

## 2011-11-13 ENCOUNTER — Encounter: Payer: Self-pay | Admitting: Internal Medicine

## 2011-11-13 ENCOUNTER — Ambulatory Visit (INDEPENDENT_AMBULATORY_CARE_PROVIDER_SITE_OTHER): Payer: Medicaid Other | Admitting: Internal Medicine

## 2011-11-13 VITALS — BP 134/77 | HR 72 | Temp 97.5°F | Ht 66.0 in | Wt 122.8 lb

## 2011-11-13 DIAGNOSIS — H109 Unspecified conjunctivitis: Secondary | ICD-10-CM

## 2011-11-13 DIAGNOSIS — E871 Hypo-osmolality and hyponatremia: Secondary | ICD-10-CM

## 2011-11-13 DIAGNOSIS — E569 Vitamin deficiency, unspecified: Secondary | ICD-10-CM

## 2011-11-13 MED ORDER — ERGOCALCIFEROL 1.25 MG (50000 UT) PO CAPS: 50000.0000 [IU] | ORAL_CAPSULE | ORAL | Status: DC

## 2011-11-13 MED ORDER — NEOMYCIN-POLYMYXIN-DEXAMETH 0.1 % OP OINT: 1.0000 "application " | TOPICAL_OINTMENT | Freq: Three times a day (TID) | OPHTHALMIC | Status: AC

## 2011-11-13 NOTE — Patient Instructions (Signed)
Please, take all your medications as prescribed and call with any questions. Please, follow up in 3 months or sooner if needed. 
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
## 2011-11-28 ENCOUNTER — Other Ambulatory Visit: Payer: Self-pay | Admitting: *Deleted

## 2011-11-28 MED ORDER — PHENYTOIN SODIUM EXTENDED 100 MG PO CAPS: 300.0000 mg | ORAL_CAPSULE | Freq: Every day | ORAL | Status: DC

## 2011-11-28 NOTE — Telephone Encounter (Signed)
Rx called in 

## 2011-12-18 ENCOUNTER — Encounter: Payer: Medicaid Other | Admitting: Internal Medicine

## 2011-12-22 ENCOUNTER — Other Ambulatory Visit: Payer: Self-pay | Admitting: Internal Medicine

## 2011-12-24 NOTE — Telephone Encounter (Signed)
Flag to front pool desk for appt per Dr Rogelia Boga.

## 2011-12-24 NOTE — Telephone Encounter (Signed)
Needs to make appt sometime Aug, Sep, OCt with new PCP.

## 2012-02-14 ENCOUNTER — Encounter: Payer: Self-pay | Admitting: Internal Medicine

## 2012-02-14 ENCOUNTER — Ambulatory Visit (INDEPENDENT_AMBULATORY_CARE_PROVIDER_SITE_OTHER): Payer: Medicaid Other | Admitting: Internal Medicine

## 2012-02-14 VITALS — BP 125/78 | HR 79 | Temp 97.5°F | Wt 122.8 lb

## 2012-02-14 DIAGNOSIS — M25511 Pain in right shoulder: Secondary | ICD-10-CM

## 2012-02-14 DIAGNOSIS — F1011 Alcohol abuse, in remission: Secondary | ICD-10-CM

## 2012-02-14 DIAGNOSIS — R569 Unspecified convulsions: Secondary | ICD-10-CM

## 2012-02-14 DIAGNOSIS — I428 Other cardiomyopathies: Secondary | ICD-10-CM

## 2012-02-14 DIAGNOSIS — Z23 Encounter for immunization: Secondary | ICD-10-CM

## 2012-02-14 DIAGNOSIS — R413 Other amnesia: Secondary | ICD-10-CM

## 2012-02-14 DIAGNOSIS — Z299 Encounter for prophylactic measures, unspecified: Secondary | ICD-10-CM

## 2012-02-14 DIAGNOSIS — R410 Disorientation, unspecified: Secondary | ICD-10-CM

## 2012-02-14 DIAGNOSIS — Z0000 Encounter for general adult medical examination without abnormal findings: Secondary | ICD-10-CM

## 2012-02-14 DIAGNOSIS — F1010 Alcohol abuse, uncomplicated: Secondary | ICD-10-CM

## 2012-02-14 DIAGNOSIS — M25519 Pain in unspecified shoulder: Secondary | ICD-10-CM

## 2012-02-14 DIAGNOSIS — F29 Unspecified psychosis not due to a substance or known physiological condition: Secondary | ICD-10-CM

## 2012-02-14 DIAGNOSIS — G40909 Epilepsy, unspecified, not intractable, without status epilepticus: Secondary | ICD-10-CM

## 2012-02-15 LAB — PHENYTOIN LEVEL, TOTAL: Phenytoin Lvl: 15.5 ug/mL (ref 10.0–20.0)

## 2012-02-15 LAB — VITAMIN D 25 HYDROXY (VIT D DEFICIENCY, FRACTURES): Vit D, 25-Hydroxy: 35 ng/mL (ref 30–89)

## 2012-02-16 DIAGNOSIS — M25511 Pain in right shoulder: Secondary | ICD-10-CM | POA: Insufficient documentation

## 2012-02-16 DIAGNOSIS — Z0000 Encounter for general adult medical examination without abnormal findings: Secondary | ICD-10-CM | POA: Insufficient documentation
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
## 2012-02-16 NOTE — Assessment & Plan Note (Signed)
-  patient and sister report taking dilantin as prescribed since April, no recent seizures although perhaps some prodrome symptoms. -Dilantin level at this visit is 15, at goal -continue at current dosage and monitor for seizures

## 2012-02-16 NOTE — Assessment & Plan Note (Signed)
-  patient has not used alcohol or drugs since April -continue to monitor

## 2012-02-16 NOTE — Assessment & Plan Note (Addendum)
-  will put patient on the list for general screening colonoscopy -tdap today

## 2012-02-16 NOTE — Assessment & Plan Note (Signed)
-  likely musculoskeletal strain -ibuprofen as needed for pain control -let us know if it worsens or fails to improve

## 2012-02-16 NOTE — Assessment & Plan Note (Addendum)
-  patient often required redirection and reminders about ADLs -this is patient's baseline, likely 2/2 traumatic brain injury -no changes in the plan, patient seems to be doing well with the care he is currently receiving  -no driving

## 2012-02-16 NOTE — Assessment & Plan Note (Addendum)
-  patient has a history of NICM with last ECHO in 2005 with EF 25-30%, decreased LV systolic function and wall motion abnormalities -he does not have edema but he has some dyspnea on exertion which may be related -will recommend getting him set up with outpatient ECHO before his next visit to reevaluate cardiac function -EKG at next visit

## 2012-02-18 ENCOUNTER — Other Ambulatory Visit: Payer: Self-pay | Admitting: *Deleted

## 2012-02-18 ENCOUNTER — Other Ambulatory Visit: Payer: Self-pay | Admitting: Internal Medicine

## 2012-02-18 DIAGNOSIS — Z0000 Encounter for general adult medical examination without abnormal findings: Secondary | ICD-10-CM

## 2012-02-18 DIAGNOSIS — I429 Cardiomyopathy, unspecified: Secondary | ICD-10-CM

## 2012-02-18 MED ORDER — ERGOCALCIFEROL 1.25 MG (50000 UT) PO CAPS: 50000.0000 [IU] | ORAL_CAPSULE | ORAL | Status: AC

## 2012-02-18 NOTE — Telephone Encounter (Signed)
Pt called asking if her brother needs to take this med, Vit D. If so he needs a Rx sent into [facility] as it is not OTC.   I noted his level was 35 ng/ml.  Do you want him to take this med?  Pt # [id number]

## 2012-02-19 NOTE — Addendum Note (Signed)
Addended by: Maura Crandall on: 02/19/2012 10:52 AM   Modules accepted: Orders

## 2012-02-19 NOTE — Progress Notes (Signed)
INTERNAL MEDICINE TEACHING ATTENDING ADDENDUM Lonzo Cloud, MD: I personally saw and evaluated Mr. [name]  in this clinic visit in conjunction with the resident, Dr.Kresty. I have discussed patient's plan of care with medical resident during this visit. I have confirmed the physical exam findings and have read and agree with the clinic note including the plan .

## 2012-03-17 ENCOUNTER — Ambulatory Visit (AMBULATORY_SURGERY_CENTER): Payer: Medicaid Other | Admitting: *Deleted

## 2012-03-17 VITALS — Ht 65.5 in | Wt 123.7 lb

## 2012-03-17 DIAGNOSIS — Z1211 Encounter for screening for malignant neoplasm of colon: Secondary | ICD-10-CM

## 2012-03-17 NOTE — Progress Notes (Signed)
Pt here with sister for PV for screening colonoscopy with Dr. Rhea Belton.  Pt has history of seizures, traumatic brain injury, and cardiomyopathy.  Last EF in 2005 was 25 to 30%.  Pt is currently waiting for Dr. Collier Bullock to schedule Echocardiogram.  Colonoscopy scheduled for 10/3 cancelled and new patient appointment scheduled with Dr. Rhea Belton for 10/22.

## 2012-03-27 ENCOUNTER — Encounter: Payer: Medicaid Other | Admitting: Internal Medicine

## 2012-04-01 ENCOUNTER — Telehealth: Payer: Self-pay | Admitting: *Deleted

## 2012-04-01 NOTE — Telephone Encounter (Signed)
Left message on recorder for 2D Echo scheduled for Oct 15th at 11am.Chris Cartmell Cassady10/8/20132:06 PM

## 2012-04-08 ENCOUNTER — Ambulatory Visit (HOSPITAL_COMMUNITY): Payer: Medicaid Other

## 2012-04-10 ENCOUNTER — Ambulatory Visit (HOSPITAL_COMMUNITY)
Admission: RE | Admit: 2012-04-10 | Discharge: 2012-04-10 | Disposition: A | Discharge status: Home / Self Care | Payer: Medicaid Other | Source: Ambulatory Visit | Attending: Internal Medicine | Admitting: Internal Medicine

## 2012-04-10 DIAGNOSIS — I429 Cardiomyopathy, unspecified: Secondary | ICD-10-CM

## 2012-04-10 DIAGNOSIS — I369 Nonrheumatic tricuspid valve disorder, unspecified: Secondary | ICD-10-CM

## 2012-04-10 DIAGNOSIS — I428 Other cardiomyopathies: Secondary | ICD-10-CM | POA: Insufficient documentation

## 2012-04-10 NOTE — Progress Notes (Signed)
  Echocardiogram 2D Echocardiogram has been performed.  Georgian Co 04/10/2012, 1:54 PM

## 2012-04-11 ENCOUNTER — Encounter: Payer: Self-pay | Admitting: Internal Medicine

## 2012-04-15 ENCOUNTER — Ambulatory Visit (INDEPENDENT_AMBULATORY_CARE_PROVIDER_SITE_OTHER): Payer: Medicaid Other | Admitting: Internal Medicine

## 2012-04-15 ENCOUNTER — Encounter: Payer: Self-pay | Admitting: Internal Medicine

## 2012-04-15 VITALS — BP 108/58 | HR 72 | Ht 65.0 in | Wt 127.6 lb

## 2012-04-15 DIAGNOSIS — Z1211 Encounter for screening for malignant neoplasm of colon: Secondary | ICD-10-CM

## 2012-04-15 NOTE — Patient Instructions (Addendum)
You have been scheduled for a colonoscopy with propofol. Please follow written instructions given to you at your visit today.  Please pick up your prep kit at the pharmacy within the next 1-3 days. If you use inhalers (even only as needed), please bring them with you on the day of your procedure.  

## 2012-04-15 NOTE — Progress Notes (Signed)
Patient ID: [name], male   DOB: Jan 29, 1956, 56 y.o.   MRN: [id number]  SUBJECTIVE: HPI [name] is a 56 yo male with PMH of alcohol abuse in remission, seizures controlled on Dilantin, hypertension, history of TBI who is seen in consultation at request of Dr. Collier Bullock for evaluation for screening colonoscopy. He is accompanied today by his sister with whom he lives. He has never had a colonoscopy and initially was sent for direct procedure, but this was canceled basal history of cardiomyopathy. The patient is feeling well without specific complaint. He denies abdominal pain, diarrhea, constipation or change in bowel habits. No rectal bleeding or melena. He denies heartburn, trouble swallowing or painful swallowing. No nausea or vomiting. He does have a history of a cold use but has not had alcohol since April 2013. He denies chest pain, shortness of breath, lower extremity edema, orthopnea and PND. He does have a history of seizures but none in at least 6 months and likely much longer.  He had a recent echocardiogram performed and this was reviewed. EF is now 50-55% with normal wall motion  Review of Systems  As per history of present illness, otherwise negative   Past Medical History  Diagnosis Date  . Seizures     last seizure several years ago  . Hypertension   . Alcohol abuse     h/o, quit april 5th, 2013  . Memory deficits   . Blood transfusion   . TBI (traumatic brain injury)     Sept 1988  . Cardiomegaly     EF 25-30 % in 2005, EF 50-55% Oct 2013    Current Outpatient Prescriptions  Medication Sig Dispense Refill  . atenolol (TENORMIN) 25 MG tablet Take 1 tablet (25 mg total) by mouth at bedtime.  30 tablet  11  . ergocalciferol (VITAMIN D2) 50000 UNITS capsule Take 1 capsule (50,000 Units total) by mouth once a week.  30 capsule  2  . Multiple Vitamin (MULITIVITAMIN WITH MINERALS) TABS Take 1 tablet by mouth daily.      . phenytoin (DILANTIN) 100 MG ER capsule take 3  capsules by mouth at bedtime  90 capsule  5  . thiamine (VITAMIN B-1) 100 MG tablet Take 100 mg by mouth daily.      Marland Kitchen DISCONTD: topiramate (TOPAMAX) 25 MG tablet Take 25 mg by mouth 2 (two) times daily.      Marland Kitchen DISCONTD: triamterene-hydrochlorothiazide (MAXZIDE-25) 37.5-25 MG per tablet Take 0.5 tablets by mouth daily.        No Known Allergies  Family History  Problem Relation Age of Onset  . Breast cancer Maternal Aunt   . Esophageal cancer Cousin   . Colon cancer Neg Hx   . Stomach cancer Neg Hx   . Esophageal cancer Maternal Uncle   . Liver disease Mother     History  Substance Use Topics  . Smoking status: Current Every Day Smoker -- 0.1 packs/day    Types: Cigarettes  . Smokeless tobacco: Never Used  . Alcohol Use: No     per sister, last drink was apr 5th- hx of ETOH    OBJECTIVE: BP 108/58  Pulse 72  Ht 5\' 5"  (1.651 m)  Wt 127 lb 9.6 oz (57.879 kg)  BMI 21.23 kg/m2 Constitutional: Well-developed and well-nourished. No distress. HEENT: Normocephalic and atraumatic. Oropharynx is clear and moist. No oropharyngeal exudate. Poor dentition. Conjunctivae are normal. No scleral icterus. Neck: Neck supple. Trachea midline. Cardiovascular: Normal rate, regular rhythm  and intact distal pulses. No M/R/G Pulmonary/chest: Effort normal and breath sounds normal. No wheezing, rales or rhonchi. Abdominal: Soft, nontender, nondistended. Bowel sounds active throughout.  Extremities: no clubbing, cyanosis, or edema Neurological: Alert and oriented to person place and time, speech is slow Skin: Skin is warm and dry. No rashes noted. Psychiatric: Normal mood and affect. Behavior is normal.  Labs and Imaging -- CBC    Component Value Date/Time   WBC 7.3 10/19/2011 0540   RBC 4.42 10/19/2011 0540   HGB 14.0 10/19/2011 0540   HCT 42.2 10/19/2011 0540   PLT 193 10/19/2011 0540   MCV 95.5 10/19/2011 0540   MCH 31.7 10/19/2011 0540   MCHC 33.2 10/19/2011 0540   RDW 12.4 10/19/2011 0540    LYMPHSABS 1.8 10/18/2011 1700   MONOABS 1.3* 10/18/2011 1700   EOSABS 0.0 10/18/2011 1700   BASOSABS 0.0 10/18/2011 1700    ASSESSMENT AND PLAN: 56 yo male with PMH of alcohol abuse in remission, seizures controlled on Dilantin, hypertension, history of TBI who is seen in consultation at request of Dr. Collier Bullock for evaluation for screening colonoscopy  1.  CRC screening -- the patient is average risk from a colorectal cancer screening standpoint, and he has never had colonoscopy. We discussed the test today at length and he is agreeable to proceed. Based on his recent echocardiogram demonstrating normal cardiac function, I feel that he is appropriate for sedation in our endoscopy unit.  He also is without specific complaint including no signs or symptoms of CHF. Seizures are well-controlled on Dilantin with no recent seizure activity.

## 2012-04-16 ENCOUNTER — Encounter: Payer: Self-pay | Admitting: Internal Medicine

## 2012-04-17 ENCOUNTER — Encounter: Payer: Self-pay | Admitting: Internal Medicine

## 2012-04-17 ENCOUNTER — Ambulatory Visit (AMBULATORY_SURGERY_CENTER): Payer: Medicaid Other | Admitting: Internal Medicine

## 2012-04-17 VITALS — BP 153/91 | HR 65 | Temp 96.6°F | Resp 20 | Ht 65.0 in | Wt 127.0 lb

## 2012-04-17 DIAGNOSIS — D126 Benign neoplasm of colon, unspecified: Secondary | ICD-10-CM

## 2012-04-17 DIAGNOSIS — Z1211 Encounter for screening for malignant neoplasm of colon: Secondary | ICD-10-CM

## 2012-04-17 HISTORY — PX: COLONOSCOPY: SHX174

## 2012-04-17 MED ORDER — SODIUM CHLORIDE 0.9 % IV SOLN: 500.0000 mL | INTRAVENOUS | Status: DC

## 2012-04-17 NOTE — Progress Notes (Signed)
YOU HAD AN ENDOSCOPIC PROCEDURE TODAY AT THE [facility]: Refer to the procedure report that was given to you for any specific questions about what was found during the examination.  If the procedure report does not answer your questions, please call your gastroenterologist to clarify.  If you requested that your care partner not be given the details of your procedure findings, then the procedure report has been included in a sealed envelope for you to review at your convenience later.  YOU SHOULD EXPECT: Some feelings of bloating in the abdomen. Passage of more gas than usual.  Walking can help get rid of the air that was put into your GI tract during the procedure and reduce the bloating. If you had a lower endoscopy (such as a colonoscopy or flexible sigmoidoscopy) you may notice spotting of blood in your stool or on the toilet paper. If you underwent a bowel prep for your procedure, then you may not have a normal bowel movement for a few days.  DIET: Your first meal following the procedure should be a light meal and then it is ok to progress to your normal diet.  A half-sandwich or bowl of soup is an example of a good first meal.  Heavy or fried foods are harder to digest and may make you feel nauseous or bloated.  Likewise meals heavy in dairy and vegetables can cause extra gas to form and this can also increase the bloating.  Drink plenty of fluids but you should avoid alcoholic beverages for 24 hours.  ACTIVITY: Your care partner should take you home directly after the procedure.  You should plan to take it easy, moving slowly for the rest of the day.  You can resume normal activity the day after the procedure however you should NOT DRIVE or use heavy machinery for 24 hours (because of the sedation medicines used during the test).    SYMPTOMS TO REPORT IMMEDIATELY: A gastroenterologist can be reached at any hour.  During normal business hours, 8:30 AM to 5:00 PM Monday through Friday,  call [phone].  After hours and on weekends, please call the GI answering service at [phone] who will take a message and have the physician on call contact you.   Following lower endoscopy (colonoscopy or flexible sigmoidoscopy):  Excessive amounts of blood in the stool  Significant tenderness or worsening of abdominal pains  Swelling of the abdomen that is new, acute  Fever of 100F or higher  Following upper endoscopy (EGD)  Vomiting of blood or coffee ground material  New chest pain or pain under the shoulder blades  Painful or persistently difficult swallowing  New shortness of breath  Fever of 100F or higher  Black, tarry-looking stools  FOLLOW UP: If any biopsies were taken you will be contacted by phone or by letter within the next 1-3 weeks.  Call your gastroenterologist if you have not heard about the biopsies in 3 weeks.  Our staff will call the home number listed on your records the next business day following your procedure to check on you and address any questions or concerns that you may have at that time regarding the information given to you following your procedure. This is a courtesy call and so if there is no answer at the home number and we have not heard from you through the emergency physician on call, we will assume that you have returned to your regular daily activities without incident.  SIGNATURES/CONFIDENTIALITY: You and/or your care   partner have signed paperwork which will be entered into your electronic medical record.  These signatures attest to the fact that that the information above on your After Visit Summary has been reviewed and is understood.  Full responsibility of the confidentiality of this discharge information lies with you and/or your care-partner.  

## 2012-04-17 NOTE — Patient Instructions (Addendum)
HOLD ASPIRIN PRODUCTS AND ANTIINFLAMATORIES FOR ONE WEEK, October 31,2013. HIGH FIBER DIET.    YOU HAD AN ENDOSCOPIC PROCEDURE TODAY AT THE [facility]: Refer to the procedure report that was given to you for any specific questions about what was found during the examination.  If the procedure report does not answer your questions, please call your gastroenterologist to clarify.  If you requested that your care partner not be given the details of your procedure findings, then the procedure report has been included in a sealed envelope for you to review at your convenience later.  YOU SHOULD EXPECT: Some feelings of bloating in the abdomen. Passage of more gas than usual.  Walking can help get rid of the air that was put into your GI tract during the procedure and reduce the bloating. If you had a lower endoscopy (such as a colonoscopy or flexible sigmoidoscopy) you may notice spotting of blood in your stool or on the toilet paper. If you underwent a bowel prep for your procedure, then you may not have a normal bowel movement for a few days.  DIET: Your first meal following the procedure should be a light meal and then it is ok to progress to your normal diet.  A half-sandwich or bowl of soup is an example of a good first meal.  Heavy or fried foods are harder to digest and may make you feel nauseous or bloated.  Likewise meals heavy in dairy and vegetables can cause extra gas to form and this can also increase the bloating.  Drink plenty of fluids but you should avoid alcoholic beverages for 24 hours.  ACTIVITY: Your care partner should take you home directly after the procedure.  You should plan to take it easy, moving slowly for the rest of the day.  You can resume normal activity the day after the procedure however you should NOT DRIVE or use heavy machinery for 24 hours (because of the sedation medicines used during the test).    SYMPTOMS TO REPORT IMMEDIATELY: A gastroenterologist can  be reached at any hour.  During normal business hours, 8:30 AM to 5:00 PM Monday through Friday, call [phone].  After hours and on weekends, please call the GI answering service at [phone] who will take a message and have the physician on call contact you.   Following lower endoscopy (colonoscopy or flexible sigmoidoscopy):  Excessive amounts of blood in the stool  Significant tenderness or worsening of abdominal pains  Swelling of the abdomen that is new, acute  Fever of 100F or higher  FOLLOW UP: If any biopsies were taken you will be contacted by phone or by letter within the next 1-3 weeks.  Call your gastroenterologist if you have not heard about the biopsies in 3 weeks.  Our staff will call the home number listed on your records the next business day following your procedure to check on you and address any questions or concerns that you may have at that time regarding the information given to you following your procedure. This is a courtesy call and so if there is no answer at the home number and we have not heard from you through the emergency physician on call, we will assume that you have returned to your regular daily activities without incident.  SIGNATURES/CONFIDENTIALITY: You and/or your care partner have signed paperwork which will be entered into your electronic medical record.  These signatures attest to the fact that that the information above on your After  Visit Summary has been reviewed and is understood.  Full responsibility of the confidentiality of this discharge information lies with you and/or your care-partner.

## 2012-04-17 NOTE — Op Note (Signed)
[facility] [address] [place], [address]   COLONOSCOPY PROCEDURE REPORT  PATIENT: [name], [name]  MR#: [id number] BIRTHDATE: 1955/08/29 , 56  yrs. old GENDER: Male ENDOSCOPIST: Beverley Fiedler, MD REFERRED VF:IEPPI M. Collier Bullock, MD PROCEDURE DATE:  04/17/2012 PROCEDURE:   Colonoscopy with snare polypectomy ASA CLASS:   Class III INDICATIONS:average risk screening and first colonoscopy. MEDICATIONS: MAC sedation, administered by CRNA and Propofol (Diprivan) 270 mg IV  DESCRIPTION OF PROCEDURE:   After the risks benefits and alternatives of the procedure were thoroughly explained, informed consent was obtained.  A digital rectal exam revealed no rectal mass.   The LB [id number]  endoscope was introduced through the anus and advanced to the cecum, which was identified by both the appendix and ileocecal valve. No adverse events experienced. The quality of the prep was good, using MoviPrep  The instrument was then slowly withdrawn as the colon was fully examined.   COLON FINDINGS: A sessile polyp measuring 5 mm in size was found in the descending colon.  A polypectomy was performed with a cold snare.  The resection was complete and the polyp tissue was completely retrieved.   Mild diverticulosis was noted in the sigmoid colon.   The colon mucosa was otherwise normal. Retroflexed views revealed no abnormalities. The time to cecum=3 minutes 34 seconds.  Withdrawal time=11 minutes 29 seconds.  The scope was withdrawn and the procedure completed. COMPLICATIONS: There were no complications.  ENDOSCOPIC IMPRESSION: 1.   Sessile polyp measuring 5 mm in size was found in the descending colon; polypectomy was performed with a cold snare 2.   Mild diverticulosis was noted in the sigmoid colon 3.   The colon mucosa was otherwise normal  RECOMMENDATIONS: 1.  Hold aspirin, aspirin products, and anti-inflammatory medication for 1 week. 2.  Await pathology  results 3.  High fiber diet 4.  If the polyp removed today is proven to be an adenomatous (pre-cancerous) polyp, you will need a repeat colonoscopy in 5 years.  Otherwise you should continue to follow colorectal cancer screening guidelines for "routine risk" patients with colonoscopy in 10 years.  You will receive a letter within 1-2 weeks with the results of your biopsy as well as final recommendations.  Please call my office if you have not received a letter after 3 weeks.  eSigned:  Beverley Fiedler, MD 04/17/2012 2:41 PM  cc: The Patient  and Dr. Denton Ar, MD

## 2012-04-18 ENCOUNTER — Telehealth: Payer: Self-pay

## 2012-04-18 NOTE — Telephone Encounter (Signed)
  Follow up Call-  Call back number 04/17/2012  Post procedure Call Back phone  # [phone] mom's #  Permission to leave phone message Yes     Patient questions:  Do you have a fever, pain , or abdominal swelling? no Pain Score  0 *  Have you tolerated food without any problems? yes  Have you been able to return to your normal activities? yes  Do you have any questions about your discharge instructions: Diet   no Medications  no Follow up visit  no  Do you have questions or concerns about your Care? no  Actions: * If pain score is 4 or above: No action needed, pain <4.

## 2012-04-23 ENCOUNTER — Encounter: Payer: Self-pay | Admitting: Internal Medicine

## 2012-05-08 ENCOUNTER — Ambulatory Visit (INDEPENDENT_AMBULATORY_CARE_PROVIDER_SITE_OTHER): Payer: Medicaid Other | Admitting: Internal Medicine

## 2012-05-08 ENCOUNTER — Encounter: Payer: Self-pay | Admitting: Internal Medicine

## 2012-05-08 VITALS — BP 120/73 | HR 90 | Wt 128.8 lb

## 2012-05-08 DIAGNOSIS — M25511 Pain in right shoulder: Secondary | ICD-10-CM

## 2012-05-08 DIAGNOSIS — N390 Urinary tract infection, site not specified: Secondary | ICD-10-CM

## 2012-05-08 DIAGNOSIS — R29898 Other symptoms and signs involving the musculoskeletal system: Secondary | ICD-10-CM

## 2012-05-08 DIAGNOSIS — R569 Unspecified convulsions: Secondary | ICD-10-CM

## 2012-05-08 DIAGNOSIS — M238X9 Other internal derangements of unspecified knee: Secondary | ICD-10-CM

## 2012-05-08 DIAGNOSIS — Z0000 Encounter for general adult medical examination without abnormal findings: Secondary | ICD-10-CM

## 2012-05-08 DIAGNOSIS — M25519 Pain in unspecified shoulder: Secondary | ICD-10-CM

## 2012-05-08 DIAGNOSIS — I428 Other cardiomyopathies: Secondary | ICD-10-CM

## 2012-05-08 DIAGNOSIS — J069 Acute upper respiratory infection, unspecified: Secondary | ICD-10-CM | POA: Insufficient documentation

## 2012-05-08 MED ORDER — IBUPROFEN 200 MG PO TABS: 200.0000 mg | ORAL_TABLET | Freq: Three times a day (TID) | ORAL | Status: DC | PRN

## 2012-05-08 MED ORDER — ACETAMINOPHEN 325 MG PO TABS: 650.0000 mg | ORAL_TABLET | Freq: Four times a day (QID) | ORAL | Status: DC | PRN
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
## 2012-05-08 NOTE — Assessment & Plan Note (Signed)
-  will defer flu shot as patient has URI -discussed results of colonoscopy with 5mm sessile polyp, path came back as tubular adenoma, as well as diverticulosis noted, will need repeat colonoscopy in 5 years. -patient already on high-fiber diet for diverticulosis

## 2012-05-08 NOTE — Assessment & Plan Note (Signed)
Pt with one week of congestion, nasal drainage, cough. Lungs without wheezing or rhonchi. No signs to indicate pneumonia or bacterial infection. + sick contacts  -cont symptomatic treatment as needed -if symptoms do not resolve within a week, pt will call the clinic

## 2012-05-08 NOTE — Assessment & Plan Note (Signed)
Patient continues to have right shoulder pain with movements above 90 degrees. Strength and sensation are preserved. Pt and caregiver interested in pursuing conservative management at this time.  -tylenol and ibuprofen for pain -will refer to outpatient physical therapy -f/u in 1-2 months, if not improved, then will refer to sports medicine and pursue imaging.

## 2012-05-08 NOTE — Assessment & Plan Note (Signed)
Pt with history of NICM, previous ECHO in 2005 with EG of 25-30%. Repeat ECHO in October 2013 showed EF 50-55% with preserved systolic function.  No signs or symptoms of heart failure. No crackles, shortness of breath, LE edema.  -discussed ECHO results and answered questions of patient and caregiver -EKG at next visit, ran out of time today with discussion of other issues
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
## 2012-05-08 NOTE — Patient Instructions (Addendum)
Please return to clinic in 2-3 months for regular follow up. Sooner if needed. Please don't hesitate to call with questions.

## 2012-05-08 NOTE — Assessment & Plan Note (Signed)
-  continues to be seizure free, taking medications regularly. -last dilantin level in august 2013 was normal -cont dilantin at current dose -recheck dilantin level at next visit

## 2012-05-13 ENCOUNTER — Ambulatory Visit: Payer: Medicaid Other | Attending: Internal Medicine | Admitting: Physical Therapy

## 2012-05-13 DIAGNOSIS — R269 Unspecified abnormalities of gait and mobility: Secondary | ICD-10-CM | POA: Insufficient documentation

## 2012-05-13 DIAGNOSIS — M6281 Muscle weakness (generalized): Secondary | ICD-10-CM | POA: Insufficient documentation

## 2012-05-13 DIAGNOSIS — M25519 Pain in unspecified shoulder: Secondary | ICD-10-CM | POA: Insufficient documentation

## 2012-05-13 DIAGNOSIS — IMO0001 Reserved for inherently not codable concepts without codable children: Secondary | ICD-10-CM | POA: Insufficient documentation

## 2012-05-13 DIAGNOSIS — M25569 Pain in unspecified knee: Secondary | ICD-10-CM | POA: Insufficient documentation

## 2012-05-20 ENCOUNTER — Ambulatory Visit: Payer: Medicaid Other | Admitting: Physical Therapy

## 2012-05-27 ENCOUNTER — Ambulatory Visit: Payer: Medicaid Other | Attending: Internal Medicine | Admitting: Physical Therapy

## 2012-05-27 DIAGNOSIS — R269 Unspecified abnormalities of gait and mobility: Secondary | ICD-10-CM | POA: Insufficient documentation

## 2012-05-27 DIAGNOSIS — IMO0001 Reserved for inherently not codable concepts without codable children: Secondary | ICD-10-CM | POA: Insufficient documentation

## 2012-05-27 DIAGNOSIS — M25519 Pain in unspecified shoulder: Secondary | ICD-10-CM | POA: Insufficient documentation

## 2012-05-27 DIAGNOSIS — M25569 Pain in unspecified knee: Secondary | ICD-10-CM | POA: Insufficient documentation

## 2012-05-27 DIAGNOSIS — M6281 Muscle weakness (generalized): Secondary | ICD-10-CM | POA: Insufficient documentation

## 2012-05-29 ENCOUNTER — Ambulatory Visit (INDEPENDENT_AMBULATORY_CARE_PROVIDER_SITE_OTHER): Payer: Medicaid Other | Admitting: *Deleted

## 2012-05-29 DIAGNOSIS — Z23 Encounter for immunization: Secondary | ICD-10-CM

## 2012-06-03 ENCOUNTER — Ambulatory Visit: Payer: Medicaid Other | Admitting: Physical Therapy

## 2012-06-10 ENCOUNTER — Encounter: Payer: Medicaid Other | Admitting: Physical Therapy

## 2012-06-23 ENCOUNTER — Other Ambulatory Visit: Payer: Self-pay | Admitting: Internal Medicine

## 2012-06-26 NOTE — Telephone Encounter (Signed)
Has Jan appt with PCP. Will refill 6 months.

## 2012-07-10 ENCOUNTER — Encounter: Payer: Self-pay | Admitting: Internal Medicine

## 2012-07-10 ENCOUNTER — Ambulatory Visit (INDEPENDENT_AMBULATORY_CARE_PROVIDER_SITE_OTHER): Payer: Medicaid Other | Admitting: Internal Medicine

## 2012-07-10 VITALS — BP 116/69 | HR 77 | Temp 98.1°F | Wt 124.4 lb

## 2012-07-10 DIAGNOSIS — R29898 Other symptoms and signs involving the musculoskeletal system: Secondary | ICD-10-CM

## 2012-07-10 DIAGNOSIS — Z23 Encounter for immunization: Secondary | ICD-10-CM

## 2012-07-10 DIAGNOSIS — S069X9A Unspecified intracranial injury with loss of consciousness of unspecified duration, initial encounter: Secondary | ICD-10-CM

## 2012-07-10 DIAGNOSIS — M25511 Pain in right shoulder: Secondary | ICD-10-CM

## 2012-07-10 DIAGNOSIS — M25519 Pain in unspecified shoulder: Secondary | ICD-10-CM

## 2012-07-10 DIAGNOSIS — R569 Unspecified convulsions: Secondary | ICD-10-CM

## 2012-07-10 DIAGNOSIS — M238X9 Other internal derangements of unspecified knee: Secondary | ICD-10-CM

## 2012-07-10 NOTE — Assessment & Plan Note (Signed)
Patient has gone to physical therapy and has improved dramatically since the last visit. He is not able to move his arm with minimal pain.  -Continue ibuprofen as needed -Continue to do the exercises recommended by physical therapy

## 2012-07-10 NOTE — Assessment & Plan Note (Signed)
Patient continues to require a lot of assistance at home to do his memory and cognitive impairment. His caregiver [name] is having some difficulty leaving him with her elderly mother all she is at work. She is looking into the [place] program for some caregiver relief and comprehensive care for the patient. She asks whether I would recommend the program as she wants only what's best for the patient. Discussed that she does need some relief as a caregiver for both the patient and her elderly mother. Patient has a wonderful program that I feel would greatly benefit this patient.

## 2012-07-10 NOTE — Addendum Note (Signed)
Addended by: Maura Crandall on: 07/10/2012 04:32 PM   Modules accepted: Orders

## 2012-07-10 NOTE — Patient Instructions (Signed)
Return to clinic in 6 months.

## 2012-07-10 NOTE — Assessment & Plan Note (Signed)
Paperwork filled out for knee brace. Will call to make sure patient can receive this device.

## 2012-07-10 NOTE — Assessment & Plan Note (Signed)
And continues to be seizure free and takes medicines as prescribed. Last Dilantin level was normal.  -Recheck Dilantin level today
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
## 2012-07-11 LAB — PHENYTOIN LEVEL, TOTAL: Phenytoin Lvl: 14.8 ug/mL (ref 10.0–20.0)

## 2012-07-17 ENCOUNTER — Ambulatory Visit: Payer: Medicaid Other | Attending: Internal Medicine | Admitting: Physical Therapy

## 2012-07-17 DIAGNOSIS — M6281 Muscle weakness (generalized): Secondary | ICD-10-CM | POA: Insufficient documentation

## 2012-07-17 DIAGNOSIS — IMO0001 Reserved for inherently not codable concepts without codable children: Secondary | ICD-10-CM | POA: Insufficient documentation

## 2012-07-17 DIAGNOSIS — M25519 Pain in unspecified shoulder: Secondary | ICD-10-CM | POA: Insufficient documentation

## 2012-07-17 DIAGNOSIS — R269 Unspecified abnormalities of gait and mobility: Secondary | ICD-10-CM | POA: Insufficient documentation

## 2012-07-17 DIAGNOSIS — M25569 Pain in unspecified knee: Secondary | ICD-10-CM | POA: Insufficient documentation

## 2012-07-28 ENCOUNTER — Ambulatory Visit: Payer: Medicaid Other | Attending: Internal Medicine | Admitting: Physical Therapy

## 2012-07-28 DIAGNOSIS — M6281 Muscle weakness (generalized): Secondary | ICD-10-CM | POA: Insufficient documentation

## 2012-07-28 DIAGNOSIS — M25519 Pain in unspecified shoulder: Secondary | ICD-10-CM | POA: Insufficient documentation

## 2012-07-28 DIAGNOSIS — M25569 Pain in unspecified knee: Secondary | ICD-10-CM | POA: Insufficient documentation

## 2012-07-28 DIAGNOSIS — R269 Unspecified abnormalities of gait and mobility: Secondary | ICD-10-CM | POA: Insufficient documentation

## 2012-07-28 DIAGNOSIS — IMO0001 Reserved for inherently not codable concepts without codable children: Secondary | ICD-10-CM | POA: Insufficient documentation

## 2012-07-29 ENCOUNTER — Ambulatory Visit (INDEPENDENT_AMBULATORY_CARE_PROVIDER_SITE_OTHER): Payer: Medicaid Other | Admitting: Internal Medicine

## 2012-07-29 ENCOUNTER — Encounter: Payer: Self-pay | Admitting: Internal Medicine

## 2012-07-29 VITALS — BP 132/73 | HR 73 | Temp 97.3°F | Ht 66.0 in | Wt 119.6 lb

## 2012-07-29 DIAGNOSIS — M79604 Pain in right leg: Secondary | ICD-10-CM

## 2012-07-29 DIAGNOSIS — M79609 Pain in unspecified limb: Secondary | ICD-10-CM

## 2012-07-29 MED ORDER — CYCLOBENZAPRINE HCL 5 MG PO TABS: 5.0000 mg | ORAL_TABLET | Freq: Three times a day (TID) | ORAL | Status: DC | PRN

## 2012-07-29 NOTE — Patient Instructions (Addendum)
General Instructions: Please schedule a follow up appointment in 1-2 months or sooner if needed . Please bring your medication bottles with your next appointment. Please take your medicines as prescribed. Please use heat or ice to the affected area.  Please start taking flexeril for the pain     Treatment Goals:  Goals (1 Years of Data) as of 07/29/2012    None      Progress Toward Treatment Goals:  Treatment Goal 07/29/2012  Blood pressure at goal    Self Care Goals & Plans:  Self Care Goal 07/29/2012  Manage my medications take my medicines as prescribed; bring my medications to every visit  Eat healthy foods drink diet soda or water instead of juice or soda; eat more vegetables  Stop smoking call [facility] (1-800-QUIT-NOW)       Care Management & Community Referrals:  Referral 07/29/2012  Referrals made for care management support none needed
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
## 2012-07-30 DIAGNOSIS — M5417 Radiculopathy, lumbosacral region: Secondary | ICD-10-CM | POA: Insufficient documentation

## 2012-07-30 NOTE — Assessment & Plan Note (Signed)
Patient reports having right leg pain for last 5 days. Differentials include musculoskeletal versus claudication( given his smoking history) versus radiculopathy. Etiology is unclear at this point. DVT is unlikely in the absence of any redness, warmth or swelling on exam. Would treat this as musculoskeletal pain for now. - Continue to IbuProfen every 8 hours. Would add Flexeril. - Continue applying heat to the area - Call the clinic if the pain does not improve in 1 week.

## 2012-07-31 ENCOUNTER — Ambulatory Visit (INDEPENDENT_AMBULATORY_CARE_PROVIDER_SITE_OTHER): Payer: Medicaid Other | Admitting: Internal Medicine

## 2012-07-31 ENCOUNTER — Encounter: Payer: Self-pay | Admitting: Internal Medicine

## 2012-07-31 ENCOUNTER — Telehealth: Payer: Self-pay | Admitting: *Deleted

## 2012-07-31 ENCOUNTER — Ambulatory Visit (HOSPITAL_COMMUNITY)
Admission: RE | Admit: 2012-07-31 | Discharge: 2012-07-31 | Disposition: A | Discharge status: Home / Self Care | Payer: Medicaid Other | Source: Ambulatory Visit | Attending: Internal Medicine | Admitting: Internal Medicine

## 2012-07-31 VITALS — BP 118/68 | HR 78 | Temp 97.5°F | Wt 121.0 lb

## 2012-07-31 DIAGNOSIS — I714 Abdominal aortic aneurysm, without rupture, unspecified: Secondary | ICD-10-CM | POA: Insufficient documentation

## 2012-07-31 DIAGNOSIS — M79604 Pain in right leg: Secondary | ICD-10-CM

## 2012-07-31 DIAGNOSIS — R350 Frequency of micturition: Secondary | ICD-10-CM

## 2012-07-31 DIAGNOSIS — M545 Low back pain, unspecified: Secondary | ICD-10-CM | POA: Insufficient documentation

## 2012-07-31 DIAGNOSIS — M79609 Pain in unspecified limb: Secondary | ICD-10-CM

## 2012-07-31 DIAGNOSIS — M4120 Other idiopathic scoliosis, site unspecified: Secondary | ICD-10-CM | POA: Insufficient documentation

## 2012-07-31 DIAGNOSIS — I712 Thoracic aortic aneurysm, without rupture, unspecified: Secondary | ICD-10-CM | POA: Insufficient documentation

## 2012-07-31 LAB — POCT URINALYSIS DIPSTICK
Glucose, UA: 100
Nitrite, UA: NEGATIVE
Protein, UA: NEGATIVE
Spec Grav, UA: 1.01
Urobilinogen, UA: 0.2

## 2012-07-31 MED ORDER — KETOROLAC TROMETHAMINE 30 MG/ML IJ SOLN
30.0000 mg | Freq: Once | INTRAMUSCULAR | Status: AC
  Administered 2012-07-31: 30 mg via INTRAMUSCULAR

## 2012-07-31 MED ORDER — HYDROCODONE-ACETAMINOPHEN 10-300 MG PO TABS: 1.0000 | ORAL_TABLET | Freq: Four times a day (QID) | ORAL | Status: DC | PRN

## 2012-07-31 NOTE — Assessment & Plan Note (Addendum)
Returns to the clinic with increased right leg pain associated with tingling and numbness, rates his pain 10 out of 10. On exam the right SLR T. is positive. I think this is likely consistent with sciatica which could be from degenerative disc disease or disc herniation. Also patient is on phenytoin for seizures which makes him more prone to osteomalacia and compression fractures, therefore would get imaging of his back. She was also complaining of some increased urinary frequency but not urinary retention or incontinence. Bladder scan was done that showed 72 cc of urine. Also denied saddle anesthesia that was confirmed on exam or bowel incontinence that makes cauda equina unlikely. -Toradol shot for pain. -Continue Flexeril. Would add Vicodin for better pain relief. -Modify physical activity as tolerated. -Avoid bending, twisting or lifting heavy weights -X-ray of back -Call the clinic or come to the ER if patient develops urinary retention, bowel incontinence or saddle anesthesia. Patient and his sister verbalizes understanding.  Update: X ray of back shows mild scoliosis. Osteoarthritic change at multiple levels. No fracture or spondylolisthesis. Aneurysmal dilatation with extensive calcification in the lower thoracic and upper abdominal  aortic regions. This finding has been demonstrated previously. Patient's sister was informed about the imaging results.

## 2012-07-31 NOTE — Progress Notes (Signed)
Bladder scanner - 72ml. Pt voided prior to scan.

## 2012-07-31 NOTE — Patient Instructions (Addendum)
General Instructions: Please schedule a follow up appointment in 1 week . Please bring your medication bottles with your next appointment. Please take your medicines as prescribed. Please call the clinic or take him to the ER if he has bladder or bowel incontinence or looses sensations in his private area.     Treatment Goals:  Goals (1 Years of Data) as of 07/31/2012    None      Progress Toward Treatment Goals:  Treatment Goal 07/29/2012  Blood pressure at goal    Self Care Goals & Plans:  Self Care Goal 07/29/2012  Manage my medications take my medicines as prescribed; bring my medications to every visit  Eat healthy foods drink diet soda or water instead of juice or soda; eat more vegetables  Stop smoking call [facility] (1-800-QUIT-NOW)       Care Management & Community Referrals:  Referral 07/29/2012  Referrals made for care management support none needed

## 2012-07-31 NOTE — Telephone Encounter (Signed)
Pt's sister calls and states pt is worse from time of visit for leg pain, he can hardly walk and pain is worse, he has followed instructions from last visit with no relief, appt w/ dr Dorthula Rue this pm
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
## 2012-08-04 ENCOUNTER — Telehealth: Payer: Self-pay | Admitting: *Deleted

## 2012-08-04 ENCOUNTER — Telehealth: Payer: Self-pay | Admitting: Internal Medicine

## 2012-08-04 NOTE — Telephone Encounter (Signed)
Pt's sister called stating pt is not any better. He is still having rt leg pain.  He will have some pain relief with medication but it returns.  He is following advise given at last visit but not better.   She wants to know what else she can do or do you want to see him back in the clinic?

## 2012-08-04 NOTE — Telephone Encounter (Signed)
I discussed his case with Dr. Kem Kays and we think that given the severity of his symptoms, he can be evaluated in the clinic tom. Sister was called and informed about the same.    Thanks, [employer]

## 2012-08-04 NOTE — Telephone Encounter (Signed)
I returned the phone call. Patient's sister is still complaining of lot of right leg pain - difficulty walking and continued increased frequency. Flexeril and vicodin are not helping him much. I explained to them that sciatica can sometime take longer ( 1-2 weeks to get better). Patient has been having increase pain for last 1 week atleast.  The reason for his increased urinary frequency is not clear to me. It could be related to BPH.His Urine dipstick checked with the clinic visit was WNL. He had bladder scan that showed 72 cc residual.

## 2012-08-05 ENCOUNTER — Encounter (HOSPITAL_COMMUNITY): Payer: Self-pay | Admitting: *Deleted

## 2012-08-05 ENCOUNTER — Emergency Department (HOSPITAL_COMMUNITY)
Admission: EM | Admit: 2012-08-05 | Discharge: 2012-08-05 | Disposition: A | Discharge status: Home / Self Care | Payer: Medicaid Other | Attending: Emergency Medicine | Admitting: Emergency Medicine

## 2012-08-05 DIAGNOSIS — Z79899 Other long term (current) drug therapy: Secondary | ICD-10-CM | POA: Insufficient documentation

## 2012-08-05 DIAGNOSIS — Z8781 Personal history of (healed) traumatic fracture: Secondary | ICD-10-CM | POA: Insufficient documentation

## 2012-08-05 DIAGNOSIS — I517 Cardiomegaly: Secondary | ICD-10-CM | POA: Insufficient documentation

## 2012-08-05 DIAGNOSIS — M79609 Pain in unspecified limb: Secondary | ICD-10-CM | POA: Insufficient documentation

## 2012-08-05 DIAGNOSIS — M79604 Pain in right leg: Secondary | ICD-10-CM

## 2012-08-05 DIAGNOSIS — F17200 Nicotine dependence, unspecified, uncomplicated: Secondary | ICD-10-CM | POA: Insufficient documentation

## 2012-08-05 DIAGNOSIS — M6281 Muscle weakness (generalized): Secondary | ICD-10-CM | POA: Insufficient documentation

## 2012-08-05 DIAGNOSIS — F1010 Alcohol abuse, uncomplicated: Secondary | ICD-10-CM | POA: Insufficient documentation

## 2012-08-05 DIAGNOSIS — Z87820 Personal history of traumatic brain injury: Secondary | ICD-10-CM | POA: Insufficient documentation

## 2012-08-05 DIAGNOSIS — Z8669 Personal history of other diseases of the nervous system and sense organs: Secondary | ICD-10-CM | POA: Insufficient documentation

## 2012-08-05 DIAGNOSIS — I10 Essential (primary) hypertension: Secondary | ICD-10-CM | POA: Insufficient documentation

## 2012-08-05 LAB — CBC WITH DIFFERENTIAL/PLATELET
Basophils Relative: 0 % (ref 0–1)
Eosinophils Absolute: 0.1 10*3/uL (ref 0.0–0.7)
Hemoglobin: 13.9 g/dL (ref 13.0–17.0)
Lymphs Abs: 1.6 10*3/uL (ref 0.7–4.0)
MCH: 29.1 pg (ref 26.0–34.0)
MCHC: 32.7 g/dL (ref 30.0–36.0)
Neutro Abs: 4.6 10*3/uL (ref 1.7–7.7)
Neutrophils Relative %: 65 % (ref 43–77)
Platelets: 180 10*3/uL (ref 150–400)
RBC: 4.77 MIL/uL (ref 4.22–5.81)

## 2012-08-05 LAB — COMPREHENSIVE METABOLIC PANEL
ALT: 15 U/L (ref 0–53)
Albumin: 4.3 g/dL (ref 3.5–5.2)
Alkaline Phosphatase: 102 U/L (ref 39–117)
Chloride: 92 mEq/L — ABNORMAL LOW (ref 96–112)
Potassium: 4.6 mEq/L (ref 3.5–5.1)
Sodium: 131 mEq/L — ABNORMAL LOW (ref 135–145)
Total Bilirubin: 0.2 mg/dL — ABNORMAL LOW (ref 0.3–1.2)
Total Protein: 8.3 g/dL (ref 6.0–8.3)

## 2012-08-05 LAB — CK: Total CK: 191 U/L (ref 7–232)

## 2012-08-05 MED ORDER — HYDROMORPHONE HCL PF 1 MG/ML IJ SOLN
1.0000 mg | Freq: Once | INTRAMUSCULAR | Status: AC
  Administered 2012-08-05: 1 mg via INTRAVENOUS
  Filled 2012-08-05: qty 1

## 2012-08-05 MED ORDER — OXYCODONE HCL 5 MG PO TABS: 5.0000 mg | ORAL_TABLET | ORAL | Status: DC | PRN

## 2012-08-05 MED ORDER — OXYCODONE-ACETAMINOPHEN 5-325 MG PO TABS: 2.0000 | ORAL_TABLET | ORAL | Status: DC | PRN

## 2012-08-05 MED ORDER — HYDROMORPHONE HCL PF 1 MG/ML IJ SOLN
0.5000 mg | Freq: Once | INTRAMUSCULAR | Status: AC
  Administered 2012-08-05: 0.5 mg via INTRAVENOUS
  Filled 2012-08-05: qty 1

## 2012-08-05 NOTE — ED Notes (Signed)
Pt given discharge paperwork; no additional questions by pt; pt verbalized understanding of discharge and f/u; VSS;
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
## 2012-08-05 NOTE — ED Notes (Signed)
Per sister, pt sees [facility]. Last Friday, began to have pain in right leg. Pt seen and given flexeril along with ibuprofen. On Thursday began to get worse. Given shot of pain medication and given vicodin. Pain in leg still has not improved. States constantly hurting in whatever position.

## 2012-08-05 NOTE — Progress Notes (Signed)
VASCULAR LAB PRELIMINARY  ARTERIAL  ABI completed:    RIGHT    LEFT    PRESSURE WAVEFORM  PRESSURE WAVEFORM  BRACHIAL 158 Biphasic BRACHIAL 148 Biphasic  DP 138 Diminished sharp monophasic DP 98 Diminished sharp monophasic         PT 111 Monophasic PT  Not found     PER  Not found           RIGHT LEFT  ABI 0.87 0.62   ABIs indicate a mild reduction in arterial flow on the right and a moderate reduction on the left. Cannot rule out false elevation of pressure when compared to waveforms.  Jarrett Albor, RVS 08/05/2012, 12:58 PM
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
## 2012-08-08 NOTE — ED Provider Notes (Signed)
I saw and evaluated the patient, reviewed the resident's note and I agree with the findings and plan.   .Face to face Exam:  General:  Awake HEENT:  Atraumatic Resp:  Normal effort Abd:  Nondistended Neuro:No focal weakness Lymph: No adenopathy   Nelia Shi, MD 08/08/12 1131

## 2012-08-15 ENCOUNTER — Ambulatory Visit: Payer: Medicaid Other | Admitting: Physical Therapy

## 2012-08-19 ENCOUNTER — Ambulatory Visit: Payer: Medicaid Other | Admitting: Physical Therapy

## 2012-08-25 ENCOUNTER — Other Ambulatory Visit: Payer: Self-pay | Admitting: Orthopedic Surgery

## 2012-08-25 DIAGNOSIS — M25561 Pain in right knee: Secondary | ICD-10-CM

## 2012-08-26 ENCOUNTER — Encounter: Payer: Self-pay | Admitting: Internal Medicine

## 2012-08-26 ENCOUNTER — Ambulatory Visit (INDEPENDENT_AMBULATORY_CARE_PROVIDER_SITE_OTHER): Payer: Medicaid Other | Admitting: Internal Medicine

## 2012-08-26 VITALS — BP 128/83 | HR 87 | Temp 97.4°F | Ht 66.0 in | Wt 121.6 lb

## 2012-08-26 DIAGNOSIS — N3941 Urge incontinence: Secondary | ICD-10-CM | POA: Insufficient documentation

## 2012-08-26 LAB — SYNOVIAL FLUID, CRYSTAL: Crystals, Fluid: NONE SEEN

## 2012-08-26 MED ORDER — OXYCODONE-ACETAMINOPHEN 5-325 MG PO TABS: 1.0000 | ORAL_TABLET | Freq: Three times a day (TID) | ORAL | Status: DC | PRN

## 2012-08-26 NOTE — Assessment & Plan Note (Signed)
Patient has a history of urinary incontinence that predates his right leg pain. First noted in the medical record 2 years ago per my review. Patient and his sister notes that this has been worsening recently with almost daily soiling with urine. He also had 2 episodes of bowel incontinence. History is not consistent with stress incontinence. Patient does have urinary frequency and urgency. His postvoid residual today was 30 mL and during the last visit it was 72, suggesting that he is not having urinary retention that we are able to document. He does not have a history of prostate problems, though this could be prostatic enlargement. Rectal exam showed good rectal tone, smooth prostate without any nodules noted. He is not having any dysuria or hematuria. No flank pain.patient does not report excessive water intake. Interestingly, his specific gravity at the last visit was 1.010 which may indicate that he is not able to concentrate urine properly.  -Lumbar spine CT scan as above -Repeat urine studies, including urine osms

## 2012-08-26 NOTE — Patient Instructions (Addendum)
Return to clinic in one month

## 2012-08-26 NOTE — Assessment & Plan Note (Addendum)
Patient is seeing orthopedics to workup his leg pain. He has an MRI of his right knee scheduled for next week as well as a followup appointment with Dr. Lajoyce Corners. He does have some appreciable swelling and effusion on exam today. We attempted to tap the joint but were only able to extract a small amount of fluid. We will send this for culture and hopefully crystal analysis (we were not able to order other tests given lack of adequate fluid). Patient did receive a steroid injection into his knee 2 weeks ago so this may affect the results.  As patient has also had some urinary and now fecal incontinence that is worsening, we feel it is warranted to workup spinal cord/disc pathology. It is unknown whether patient has hardware in his hip or leg, therefore, we will start by getting a CT scan of the lumbar spine. Rectal tone normal.  -Followup synovial fluid analysis -ordered CT lumbar spine -pt to follow with orthopedics -will give short term oxycodone for PRN as patient is not able to do normal activities
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
## 2012-08-27 LAB — URINALYSIS, ROUTINE W REFLEX MICROSCOPIC
Hgb urine dipstick: NEGATIVE
Leukocytes, UA: NEGATIVE
Nitrite: NEGATIVE
Specific Gravity, Urine: 1.016 (ref 1.005–1.030)
pH: 6 (ref 5.0–8.0)

## 2012-08-27 LAB — OSMOLALITY, URINE: Osmolality, Ur: 650 mOsm/kg (ref 390–1090)

## 2012-08-28 LAB — URINE CULTURE
Colony Count: NO GROWTH
Organism ID, Bacteria: NO GROWTH

## 2012-08-30 ENCOUNTER — Ambulatory Visit
Admission: RE | Admit: 2012-08-30 | Discharge: 2012-08-30 | Disposition: A | Discharge status: Home / Self Care | Payer: Medicaid Other | Source: Ambulatory Visit | Attending: Orthopedic Surgery | Admitting: Orthopedic Surgery

## 2012-09-05 ENCOUNTER — Ambulatory Visit (HOSPITAL_COMMUNITY)
Admission: RE | Admit: 2012-09-05 | Discharge: 2012-09-05 | Disposition: A | Discharge status: Home / Self Care | Payer: Medicaid Other | Source: Ambulatory Visit | Attending: Internal Medicine | Admitting: Internal Medicine

## 2012-09-05 DIAGNOSIS — I714 Abdominal aortic aneurysm, without rupture, unspecified: Secondary | ICD-10-CM | POA: Insufficient documentation

## 2012-09-05 DIAGNOSIS — IMO0002 Reserved for concepts with insufficient information to code with codable children: Secondary | ICD-10-CM | POA: Insufficient documentation

## 2012-09-05 DIAGNOSIS — I712 Thoracic aortic aneurysm, without rupture, unspecified: Secondary | ICD-10-CM | POA: Insufficient documentation

## 2012-09-05 DIAGNOSIS — M79609 Pain in unspecified limb: Secondary | ICD-10-CM | POA: Insufficient documentation

## 2012-09-10 ENCOUNTER — Telehealth: Payer: Self-pay | Admitting: *Deleted

## 2012-09-10 ENCOUNTER — Other Ambulatory Visit: Payer: Self-pay | Admitting: Internal Medicine

## 2012-09-10 ENCOUNTER — Telehealth: Payer: Self-pay | Admitting: Internal Medicine

## 2012-09-10 NOTE — Telephone Encounter (Signed)
Dr Collier Bullock is on an away rotation please call Dr Dorise Hiss

## 2012-09-10 NOTE — Telephone Encounter (Signed)
I called the patient's sister, [name], and explained the CT scan findings.  She states his symptoms have not progressed, but he is still having pain in his RLE that is mostly controlled. She states his urinary incontinence occurs when he can't get to the bathroom quick enough, so I think there is a component or urgency here.  The CT scan of L spine mentions either a mass or a disk protrusion at L4.  I don't think this is cauda equina syndrome, since there is no impingement on the spinal cord, just the nerve root. This is consistent with a radiculopathy.  I will obtain an MRI w/w/o contrast of L spine to further evaluate for mass.  He will need to come in tomorrow or Friday to be seen in clinic.  This was all informed to his sister through a phone call. 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  He is a heavy smoker. This will need to be followed every 6 months and possibly referred to vascular surgery if continue to increase in size. This was also communicated to his sister, [name].  He needs to be counseled on smoking cessation. I also called and spoke to Dr. Dorothe Pea of [facility]ACE program and I agree plans can be placed on hold for now. The patient's sister and Dr. Dorothe Pea agree.   I tried to call the patient directly, but was unsuccessful, hence I spoke with his sister who is listed on his contact list. All of the sister's questions were answered.  I spoke with her for about 20 minutes over the phone.

## 2012-09-10 NOTE — Telephone Encounter (Signed)
Discussed this with Dr. Kem Kays who was the attending with Dr Collier Bullock and the authorizing provider of the CT Scan. He will call the patient.

## 2012-09-10 NOTE — Telephone Encounter (Signed)
Pt's sister calls and leaves message that she needs to get pt's results for another agency by 1130 today, could you please call her at [phone]

## 2012-09-10 NOTE — Telephone Encounter (Signed)
Received a call from Dr Dorise Hiss with the  [facility] program - # [phone] He would like to take with Pt's PCP, Dr Collier Bullock .  He has tried to reach her but is unable. He wanted to talk about test results and informing pt and family.

## 2012-09-12 ENCOUNTER — Ambulatory Visit (INDEPENDENT_AMBULATORY_CARE_PROVIDER_SITE_OTHER): Payer: Medicaid Other | Admitting: Internal Medicine

## 2012-09-12 ENCOUNTER — Ambulatory Visit (HOSPITAL_COMMUNITY)
Admission: RE | Admit: 2012-09-12 | Discharge: 2012-09-12 | Disposition: A | Discharge status: Home / Self Care | Payer: Medicaid Other | Source: Ambulatory Visit | Attending: Internal Medicine | Admitting: Internal Medicine

## 2012-09-12 ENCOUNTER — Encounter: Payer: Self-pay | Admitting: Internal Medicine

## 2012-09-12 VITALS — BP 124/72 | HR 84 | Temp 97.7°F

## 2012-09-12 DIAGNOSIS — I714 Abdominal aortic aneurysm, without rupture, unspecified: Secondary | ICD-10-CM

## 2012-09-12 DIAGNOSIS — N3941 Urge incontinence: Secondary | ICD-10-CM

## 2012-09-12 DIAGNOSIS — R32 Unspecified urinary incontinence: Secondary | ICD-10-CM

## 2012-09-12 DIAGNOSIS — M79609 Pain in unspecified limb: Secondary | ICD-10-CM | POA: Insufficient documentation

## 2012-09-12 DIAGNOSIS — R29898 Other symptoms and signs involving the musculoskeletal system: Secondary | ICD-10-CM | POA: Insufficient documentation

## 2012-09-12 DIAGNOSIS — M47817 Spondylosis without myelopathy or radiculopathy, lumbosacral region: Secondary | ICD-10-CM | POA: Insufficient documentation

## 2012-09-12 DIAGNOSIS — M5126 Other intervertebral disc displacement, lumbar region: Secondary | ICD-10-CM | POA: Insufficient documentation

## 2012-09-12 DIAGNOSIS — M79604 Pain in right leg: Secondary | ICD-10-CM

## 2012-09-12 DIAGNOSIS — I712 Thoracic aortic aneurysm, without rupture: Secondary | ICD-10-CM | POA: Insufficient documentation

## 2012-09-12 DIAGNOSIS — R209 Unspecified disturbances of skin sensation: Secondary | ICD-10-CM | POA: Insufficient documentation

## 2012-09-12 DIAGNOSIS — I10 Essential (primary) hypertension: Secondary | ICD-10-CM

## 2012-09-12 DIAGNOSIS — M533 Sacrococcygeal disorders, not elsewhere classified: Secondary | ICD-10-CM | POA: Insufficient documentation

## 2012-09-12 DIAGNOSIS — IMO0002 Reserved for concepts with insufficient information to code with codable children: Secondary | ICD-10-CM

## 2012-09-12 LAB — BASIC METABOLIC PANEL
CO2: 29 mEq/L (ref 19–32)
Chloride: 100 mEq/L (ref 96–112)
Creat: 0.71 mg/dL (ref 0.50–1.35)
Glucose, Bld: 106 mg/dL — ABNORMAL HIGH (ref 70–99)

## 2012-09-12 MED ORDER — GADOBENATE DIMEGLUMINE 529 MG/ML IV SOLN
11.0000 mL | Freq: Once | INTRAVENOUS | Status: AC | PRN
  Administered 2012-09-12: 11 mL via INTRAVENOUS

## 2012-09-12 MED ORDER — OXYBUTYNIN CHLORIDE ER 5 MG PO TB24: 5.0000 mg | ORAL_TABLET | Freq: Every day | ORAL | Status: DC

## 2012-09-12 NOTE — Patient Instructions (Signed)
1.  We will work on getting you over to the neurosurgeon for your back and right leg pain.    2.  Start Oxybutynin 5 mg daily for the urine.  Take it at bedtime.    3.  We will schedule you for the CT of the chest so we can see the extent of the dilation of the aorta in the chest.    4.  Follow up with [place] in about 2-3 weeks after the CT scan.

## 2012-09-12 NOTE — Assessment & Plan Note (Signed)
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  We will work to refer to neurosurgery for correction and management.  We will continue with the oxycodone for pain control at this time.

## 2012-09-12 NOTE — Assessment & Plan Note (Signed)
Noted on the CT scan of the lumbar spine was a descending and abdominal aortic aneurysm with a max of 4.2 cm in the scan.  The scout films appear that this may go up much higher in the chest then we can see on this scan.  We will need to have a full characterization with a CT angiogram of the aorta so we can appropriately manage this.  He has quit smoking which is the first major step and we will continue to monitor his blood pressure closely.  He has no aortic murmur that would indicate aortic root dilation.  At his next visit we should recheck his blood pressure in each arm as well as check his lipids for risk stratification.

## 2012-09-12 NOTE — Assessment & Plan Note (Signed)
BP Readings from Last 3 Encounters:  09/12/12 124/72  08/26/12 128/83  08/05/12 153/88    Lab Results  Component Value Date   NA 131* 08/05/2012   K 4.6 08/05/2012   CREATININE 0.72 08/05/2012    Assessment:  Blood pressure control: controlled  Progress toward BP goal:  at goal  Comments: well controlled and we will have to follow closely with the thoracic aortic aneurysm.   Plan:  Medications:  continue current medications  Educational resources provided:    Self management tools provided:    Other plans:

## 2012-09-12 NOTE — Assessment & Plan Note (Addendum)
His incontinence appears to be an urge incontinence.  It is possible that the L4-5 nerve root compression could be compressing the nerve roots of the pelvic or pudendal nerve which could explain the increased incontinence since the pain started in his leg.  Since there is no evidence of Cauda equina syndrome we will treat with Oxybutynin 5 mg BID for now and see how he responds

## 2012-09-12 NOTE — Progress Notes (Signed)
Subjective:   Patient ID: [name] male   DOB: 1956-04-18 57 y.o.   MRN: [id number]  HPI: [name] is a 57 y.o. man who presents to clinic today for follow up from his last appointment.  He states that since his last appointment his leg pain is about the same.    He had a CT scan of the lumbar spine which showed a possible mass in the L4-5 right neural foramen.  This was followed up with an MRI of the lumbar spine with and without contrast which showed that this was herniated disc material.  The area that is compressed with the right L4-L5 nerve root and would correlate with his symptoms.   He continues to have problems with the urinary incontinence.  It has not changed in frequency or volume.  He continues to have times when he has urinary urgency and can not make it to the bathroom in time.  He denies any dysuria, numbness, tingling, or weakness in his perineal or pelvic area.  His PVR at his last visit was 30 ml and his U/A was negative for infection at that time.   Of note on the CT scan of the lumbar spine showed an enlarged descending and abdominal aortic aneurysm that was 4.2 cm at its maximum.  On my review of the films the area appears to continue beyond the imaged area of the chest and on the scout film there are aortic calcifications that are noted to the level of the heart.  He is a now former smoker but was a heavy smoker for many years.  He also has a history of hypertension that is well controlled today on Atenolol.  His last lipid panel was 1 year ago which showed an LDL of 113 and HDL of 60 without medication.    Past Medical History  Diagnosis Date  . Seizures     last seizure several years ago  . Hypertension   . Memory deficits   . Blood transfusion   . TBI (traumatic brain injury)     Sept 1988  . Cardiomegaly     EF 25-30 % in 2005, EF 50-55% Oct 2013  . Alcohol abuse     h/o, quit april 5th, 2013   Current Outpatient Prescriptions  Medication  Sig Dispense Refill  . atenolol (TENORMIN) 25 MG tablet Take 1 tablet (25 mg total) by mouth at bedtime.  30 tablet  11  . ergocalciferol (VITAMIN D2) 50000 UNITS capsule Take 1 capsule (50,000 Units total) by mouth once a week.  30 capsule  2  . ibuprofen (ADVIL,MOTRIN) 200 MG tablet Take 600 mg by mouth every 8 (eight) hours as needed for pain.      . Multiple Vitamin (MULITIVITAMIN WITH MINERALS) TABS Take 1 tablet by mouth daily.      Marland Kitchen oxyCODONE-acetaminophen (ROXICET) 5-325 MG per tablet Take 1 tablet by mouth every 8 (eight) hours as needed for pain.  40 tablet  0  . phenytoin (DILANTIN) 100 MG ER capsule       . [DISCONTINUED] topiramate (TOPAMAX) 25 MG tablet Take 25 mg by mouth 2 (two) times daily.      . [DISCONTINUED] triamterene-hydrochlorothiazide (MAXZIDE-25) 37.5-25 MG per tablet Take 0.5 tablets by mouth daily.       No current facility-administered medications for this visit.   Family History  Problem Relation Age of Onset  . Breast cancer Maternal Aunt   . Esophageal cancer Cousin   .  Colon cancer Neg Hx   . Stomach cancer Neg Hx   . Esophageal cancer Maternal Uncle   . Liver disease Mother    History   Social History  . Marital Status: Single    Spouse Name: N/A    Number of Children: N/A  . Years of Education: N/A   Social History Main Topics  . Smoking status: Current Every Day Smoker -- 0.20 packs/day    Types: Cigarettes  . Smokeless tobacco: Never Used     Comment: 1 pack a week  . Alcohol Use: No     Comment: per sister, last drink was apr 5th- hx of ETOH  . Drug Use: No  . Sexually Active: None   Other Topics Concern  . None   Social History Narrative  . None   Review of Systems: Negative except as noted in the HPI.   Objective:  Physical Exam: Filed Vitals:   09/12/12 1314  BP: 124/72  Pulse: 84  Temp: 97.7 F (36.5 C)  TempSrc: Oral  SpO2: 97%   Constitutional: Vital signs reviewed.  Patient is a well-developed and well-nourished  man in a wheelchair in no acute distress and cooperative with exam. Alert and oriented x3.  Head: Normocephalic and atraumatic Ear: TM normal bilaterally Mouth: no erythema or exudates, MMM Eyes: PERRL, EOMI, conjunctivae normal, No scleral icterus.  Neck: Supple, Trachea midline normal ROM, No JVD, mass, thyromegaly, or carotid bruit present.  Cardiovascular: RRR, S1 normal, S2 normal, no MRG, pulses symmetric and intact bilaterally Pulmonary/Chest: CTAB, no wheezes, rales, or rhonchi Abdominal: Soft. Non-tender, non-distended, bowel sounds are normal, no masses, organomegaly, or guarding present. No palpable bounding mass.  GU: no CVA tenderness Musculoskeletal: Right knee is tender to palpation distal to the patella as well as in the suprapatellar bursa.  No joint deformities, erythema,  Hematology: no cervical, inginal, or axillary adenopathy.  Neurological: A&O x3, Strength is normal and symmetric bilaterally, cranial nerve II-XII are grossly intact, left leg is 5/5 in all plans.  Right leg is 3/5 but limited greatly by pain. sensory intact to light touch bilaterally.  Skin: Warm, dry and intact. No rash, cyanosis, or clubbing.  Psychiatric: Normal mood and affect. speech and behavior is normal. Judgment and thought content normal. Cognition and memory are normal.   Assessment & Plan:

## 2012-09-16 ENCOUNTER — Other Ambulatory Visit: Payer: Self-pay | Admitting: *Deleted

## 2012-09-18 ENCOUNTER — Ambulatory Visit (HOSPITAL_COMMUNITY): Payer: Medicaid Other

## 2012-09-22 ENCOUNTER — Ambulatory Visit (HOSPITAL_COMMUNITY)
Admission: RE | Admit: 2012-09-22 | Discharge: 2012-09-22 | Disposition: A | Discharge status: Home / Self Care | Payer: Medicaid Other | Source: Ambulatory Visit | Attending: Internal Medicine | Admitting: Internal Medicine

## 2012-09-22 DIAGNOSIS — I7090 Unspecified atherosclerosis: Secondary | ICD-10-CM | POA: Insufficient documentation

## 2012-09-22 DIAGNOSIS — I714 Abdominal aortic aneurysm, without rupture: Secondary | ICD-10-CM

## 2012-09-22 DIAGNOSIS — I712 Thoracic aortic aneurysm, without rupture, unspecified: Secondary | ICD-10-CM | POA: Insufficient documentation

## 2012-09-22 DIAGNOSIS — I2510 Atherosclerotic heart disease of native coronary artery without angina pectoris: Secondary | ICD-10-CM | POA: Insufficient documentation

## 2012-09-22 MED ORDER — IOHEXOL 350 MG/ML SOLN
80.0000 mL | Freq: Once | INTRAVENOUS | Status: AC | PRN
  Administered 2012-09-22: 80 mL via INTRAVENOUS

## 2012-10-15 ENCOUNTER — Other Ambulatory Visit: Payer: Self-pay | Admitting: Neurosurgery

## 2012-10-15 DIAGNOSIS — M549 Dorsalgia, unspecified: Secondary | ICD-10-CM

## 2012-10-16 ENCOUNTER — Encounter: Payer: Self-pay | Admitting: Internal Medicine

## 2012-10-16 ENCOUNTER — Ambulatory Visit (INDEPENDENT_AMBULATORY_CARE_PROVIDER_SITE_OTHER): Payer: Medicaid Other | Admitting: Internal Medicine

## 2012-10-16 VITALS — BP 135/85 | HR 100 | Temp 97.4°F | Wt 114.4 lb

## 2012-10-16 DIAGNOSIS — Z0000 Encounter for general adult medical examination without abnormal findings: Secondary | ICD-10-CM

## 2012-10-16 DIAGNOSIS — F329 Major depressive disorder, single episode, unspecified: Secondary | ICD-10-CM | POA: Insufficient documentation

## 2012-10-16 DIAGNOSIS — R413 Other amnesia: Secondary | ICD-10-CM

## 2012-10-16 DIAGNOSIS — I714 Abdominal aortic aneurysm, without rupture: Secondary | ICD-10-CM

## 2012-10-16 DIAGNOSIS — I10 Essential (primary) hypertension: Secondary | ICD-10-CM

## 2012-10-16 DIAGNOSIS — E785 Hyperlipidemia, unspecified: Secondary | ICD-10-CM

## 2012-10-16 DIAGNOSIS — N3941 Urge incontinence: Secondary | ICD-10-CM

## 2012-10-16 DIAGNOSIS — L853 Xerosis cutis: Secondary | ICD-10-CM | POA: Insufficient documentation

## 2012-10-16 LAB — HEMOGLOBIN A1C: Mean Plasma Glucose: 123 mg/dL — ABNORMAL HIGH (ref <117)

## 2012-10-16 LAB — LIPID PANEL
Cholesterol: 214 mg/dL — ABNORMAL HIGH (ref 0–200)
Total CHOL/HDL Ratio: 4.4 Ratio
Triglycerides: 111 mg/dL (ref <150)

## 2012-10-16 MED ORDER — ATENOLOL-CHLORTHALIDONE 50-25 MG PO TABS: 1.0000 | ORAL_TABLET | Freq: Every day | ORAL | Status: DC

## 2012-10-16 MED ORDER — UREA 30 % EX CREA: TOPICAL_CREAM | Freq: Two times a day (BID) | CUTANEOUS | Status: DC

## 2012-10-16 MED ORDER — OXYBUTYNIN CHLORIDE ER 5 MG PO TB24: 5.0000 mg | ORAL_TABLET | ORAL | Status: DC

## 2012-10-16 MED ORDER — CITALOPRAM HYDROBROMIDE 20 MG PO TABS: 20.0000 mg | ORAL_TABLET | Freq: Every day | ORAL | Status: DC
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
## 2012-10-16 NOTE — Assessment & Plan Note (Addendum)
Decrease oxybutynin use to once per day if possible  -Will also give prescription for urea cream for keratotic skin

## 2012-10-16 NOTE — Patient Instructions (Addendum)
Thank you for coming in today!!! It was great to see you both! Here is a recap of what we talked about today:   Try decreasing how often you take oxybutynin to once per day if possible.  We added a new medication for blood pressure called atenolol-chlorthalidone. This will be ready to pick up by your pharmacy. Return to clinic in one month for blood pressure check and labs.  We also started a new medication called Celexa. Patient should take half a tab daily for the first week. If he is not having any side effects, he can increase to a full tab daily.  For his dry skin, we can try Carmol cream, prescription was sent to her pharmacy.  Please go to your vascular surgery appointment on April 28.    Return to clinic in one month for blood pressure check.

## 2012-10-16 NOTE — Assessment & Plan Note (Addendum)
Patient notes some depressed mood as open to trying an SSRI.   -Start celexa 20mg . Start with 1/2 tab for one week, then increase to full tab daily. -Side effects reviewed with patient and the patient's caregiver, they know to call our clinic if any questions

## 2012-10-16 NOTE — Assessment & Plan Note (Addendum)
-  check FLP, hgb A1c  Addendum LDL elevated, will start statin and check CMP in next visit

## 2012-10-16 NOTE — Assessment & Plan Note (Addendum)
I reviewed the CT chest results at length with the patient and his caregiver [name].  -refer to vascular surgery, appointment scheduled for next week -control blood pressure -Reviewed red flags and warning signs

## 2012-10-17 ENCOUNTER — Telehealth: Payer: Self-pay | Admitting: *Deleted

## 2012-10-17 NOTE — Telephone Encounter (Signed)
Received call from pt's sister [name] stating that medicaid requires a prior authorization for his oxybutynin (DITROPAN-XL) 5 MG 24 hr and urea (CARMOL) 30 % cream. Medicaid will pay for the short acting oxybutynin with BID dosing, but not the XL without a prior approval.  Pt's sister will continue to give him the short acting 5 mg tabs at this time, but wanted to know if she should go down to one tab daily as instructed by MD. As for the urea cream, out of pocket expenses is around $25. Pt's sister stated that she will go ahead and pay for the cream, but didn't know if it was something different you could prescribed or if he even needs it at all .[name]4/25/20144:55 PM

## 2012-10-18 DIAGNOSIS — R413 Other amnesia: Secondary | ICD-10-CM | POA: Insufficient documentation

## 2012-10-18 DIAGNOSIS — E785 Hyperlipidemia, unspecified: Secondary | ICD-10-CM | POA: Insufficient documentation

## 2012-10-18 MED ORDER — OXYBUTYNIN CHLORIDE 5 MG PO TABS: 5.0000 mg | ORAL_TABLET | Freq: Two times a day (BID) | ORAL | Status: DC

## 2012-10-18 MED ORDER — PRAVASTATIN SODIUM 40 MG PO TABS: 40.0000 mg | ORAL_TABLET | Freq: Every evening | ORAL | Status: DC

## 2012-10-18 NOTE — Assessment & Plan Note (Signed)
Patient's blood pressure slightly elevated at the last several visits. It is very important to keep his blood pressure under control given his thoracic aortic aneurysm. Patient has been on atenolol for some time but was previously on HCTZ, which was stopped due to normal blood pressure over a year ago. -Will try atenolol-chlorthalidone combo pill as beta blockers can be helpful in aortic aneurysms inpatient was previously on a diuretic -Will monitor closely as this may worsen his urinary incontinence which has just recently come under better control
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
## 2012-10-18 NOTE — Assessment & Plan Note (Signed)
Patient seems to be doing better and rarely has soiling events now. He still has some urgency but states that the medication is really helping. I am concerned, however, of the side effects of this medication and using it long-term. Patient is already having dry skin that may be related to this. Discussed trying to decrease use to daily dosing.  -Continue oxybutynin 5 mg twice a day, and will try decreasing to daily if possible

## 2012-10-20 ENCOUNTER — Encounter: Payer: Self-pay | Admitting: Vascular Surgery

## 2012-10-21 ENCOUNTER — Ambulatory Visit (INDEPENDENT_AMBULATORY_CARE_PROVIDER_SITE_OTHER): Payer: Medicaid Other | Admitting: Vascular Surgery

## 2012-10-21 ENCOUNTER — Encounter: Payer: Self-pay | Admitting: Vascular Surgery

## 2012-10-21 ENCOUNTER — Telehealth: Payer: Self-pay | Admitting: *Deleted

## 2012-10-21 VITALS — BP 119/69 | HR 66 | Ht 66.0 in | Wt 121.4 lb

## 2012-10-21 DIAGNOSIS — I712 Thoracic aortic aneurysm, without rupture, unspecified: Secondary | ICD-10-CM | POA: Insufficient documentation

## 2012-10-21 DIAGNOSIS — I714 Abdominal aortic aneurysm, without rupture: Secondary | ICD-10-CM | POA: Insufficient documentation

## 2012-10-21 MED ORDER — UREA 39 % EX CREA: 1.0000 "application " | TOPICAL_CREAM | Freq: Two times a day (BID) | CUTANEOUS | Status: DC

## 2012-10-21 NOTE — Progress Notes (Signed)
Subjective:     Patient ID: [name], male   DOB: 1955/09/06, 57 y.o.   MRN: [id number]  HPI this 57 year old male was referred by Dr. Collier Bullock for evaluation of an abdominal aortic aneurysm. Patient recently had a CT scan of the chest and upper abdomen because of problems with the spine. This revealed a diffuse descending thoracic aneurysm extending from the subclavian artery down to the SMA. The scan did not include the aorta below the renal arteries but it did appear to be normal size at the renal arteries. Patient had no previous knowledge of this. He is accompanied by his sister who states that he has a memory problem and she responds to most questions about his history. Apparently he is scheduled to have a intradural injection later this week for a ruptured disc.  Past Medical History  Diagnosis Date  . Seizures     last seizure several years ago  . Hypertension   . Memory deficits   . Blood transfusion   . TBI (traumatic brain injury)     Sept 1988  . Cardiomegaly     EF 25-30 % in 2005, EF 50-55% Oct 2013  . Alcohol abuse     h/o, quit april 5th, 2013    History  Substance Use Topics  . Smoking status: Former Smoker -- 0.20 packs/day    Types: Cigarettes    Quit date: 09/12/2012  . Smokeless tobacco: Never Used     Comment: last smoke 09/12/2012   . Alcohol Use: No     Comment: per sister, last drink was apr 5th- hx of ETOH    Family History  Problem Relation Age of Onset  . Breast cancer Maternal Aunt   . Esophageal cancer Cousin   . Colon cancer Neg Hx   . Stomach cancer Neg Hx   . Esophageal cancer Maternal Uncle   . Liver disease Mother   . Hypertension Mother     No Known Allergies  Current outpatient prescriptions:atenolol-chlorthalidone (TENORETIC) 50-25 MG per tablet, Take 1 tablet by mouth daily., Disp: 30 tablet, Rfl: 5;  citalopram (CELEXA) 20 MG tablet, Take 1 tablet (20 mg total) by mouth daily., Disp: 30 tablet, Rfl: 5;  ergocalciferol (VITAMIN  D2) 50000 UNITS capsule, Take 1 capsule (50,000 Units total) by mouth once a week., Disp: 30 capsule, Rfl: 2 ibuprofen (ADVIL,MOTRIN) 200 MG tablet, Take 600 mg by mouth every 8 (eight) hours as needed for pain., Disp: , Rfl: ;  Multiple Vitamin (MULITIVITAMIN WITH MINERALS) TABS, Take 1 tablet by mouth daily., Disp: , Rfl: ;  NABUMETONE PO, Take 750 mg by mouth daily., Disp: , Rfl: ;  oxybutynin (DITROPAN) 5 MG tablet, Take 1 tablet (5 mg total) by mouth 2 (two) times daily., Disp: 60 tablet, Rfl: 4 oxyCODONE-acetaminophen (ROXICET) 5-325 MG per tablet, Take 1 tablet by mouth every 8 (eight) hours as needed for pain., Disp: 40 tablet, Rfl: 0;  phenytoin (DILANTIN) 100 MG ER capsule, , Disp: , Rfl: ;  pravastatin (PRAVACHOL) 40 MG tablet, Take 1 tablet (40 mg total) by mouth every evening. For high cholesterol, Disp: 30 tablet, Rfl: 11;  urea (CARMOL) 30 % cream, Apply topically 2 (two) times daily. Apply to dry skin areas., Disp: 60 g, Rfl: 3 [DISCONTINUED] topiramate (TOPAMAX) 25 MG tablet, Take 25 mg by mouth 2 (two) times daily., Disp: , Rfl: ;  [DISCONTINUED] triamterene-hydrochlorothiazide (MAXZIDE-25) 37.5-25 MG per tablet, Take 0.5 tablets by mouth daily., Disp: , Rfl:   BP  119/69  Pulse 66  Ht 5\' 6"  (1.676 m)  Wt 121 lb 6.4 oz (55.067 kg)  BMI 19.6 kg/m2  SpO2 100%  Body mass index is 19.6 kg/(m^2).          Review of Systems history of traumatic brain injury-patient does not respond to questioning. Has pain in his legs with walking and weakness in his legs has a leg brace on the right side because of hyperextension according to sister    Objective:   Physical Exam blood pressure 119/69 heart rate 66 respirations 18 Gen.-alert and oriented x3 in no apparent distress HEENT normal for age Lungs no rhonchi or wheezing Cardiovascular regular rhythm no murmurs carotid pulses 3+ palpable no bruits audible Abdomen soft nontender no palpable masses Musculoskeletal free of  major  deformities-brace on the right leg with shortening of right lower extremity compared to left  Skin clear -no rashes Neurologic normal Lower extremities 3+ femoral and dorsalis pedis pulses palpable bilaterally with no edema  Today I reviewed the CT scan of the chest and upper abdomen by computer. He does have diffuse dilatation of the descending thoracic aorta up to a maximum diameter of 4.75 cm. This is quite diffuse and seems to terminate at about the level of the SMA. The infra-renal aorta is not included in the scan      Assessment:     Descending thoracic aortic aneurysm-maximum diameter 4.75 cm    Plan:     Discuss this with patient and his sister Will have patient return in one year with CT angiogram of chest and abdomen to be followed by Dr. Durene Cal

## 2012-10-21 NOTE — Addendum Note (Signed)
Addended by: Adria Dill L on: 10/21/2012 04:50 PM   Modules accepted: Orders

## 2012-10-21 NOTE — Telephone Encounter (Signed)
Called patient on Saturday, no answer. Was able to talk to his sister on the phone this evening. New Rx sent for 39% urea cream and new Rx received for short acting oxybutynin. Patient's sister appreciative.  Of note, patient went to vascular surgery appointment with Dr. Hart Rochester for his descending aortic aneurysm and they wanted to monitor him and see him back in one year for repeat imaging. No surgery at this time.  Patient also scheduled for spinal injection this Thursday.  Denton Ar

## 2012-10-21 NOTE — Telephone Encounter (Signed)
Sister of pt called - uses [facility]. Pharmacy does not carry Carmol 30% cream - they have 20% which is OTC or they have 40% which they need new Rx. Stanton Kidney Raney Antwine RN 10/21/12 3PM

## 2012-10-23 ENCOUNTER — Ambulatory Visit
Admission: RE | Admit: 2012-10-23 | Discharge: 2012-10-23 | Disposition: A | Discharge status: Home / Self Care | Payer: Medicaid Other | Source: Ambulatory Visit | Attending: Neurosurgery | Admitting: Neurosurgery

## 2012-10-23 DIAGNOSIS — M549 Dorsalgia, unspecified: Secondary | ICD-10-CM

## 2012-10-23 MED ORDER — IOHEXOL 180 MG/ML  SOLN
1.0000 mL | Freq: Once | INTRAMUSCULAR | Status: AC | PRN
  Administered 2012-10-23: 1 mL via EPIDURAL

## 2012-10-23 MED ORDER — METHYLPREDNISOLONE ACETATE 40 MG/ML INJ SUSP (RADIOLOG
120.0000 mg | Freq: Once | INTRAMUSCULAR | Status: AC
  Administered 2012-10-23: 120 mg via EPIDURAL

## 2012-10-27 NOTE — Addendum Note (Signed)
Addended by: Bufford Spikes on: 10/27/2012 03:38 PM   Modules accepted: Orders

## 2012-10-27 NOTE — Addendum Note (Signed)
Addended by: Bufford Spikes on: 10/27/2012 03:36 PM   Modules accepted: Orders

## 2012-10-27 NOTE — Progress Notes (Signed)
Case discussed with Dr. Kathryn Glenn at the time of the visit, immediately after the resident saw the patient.  I reviewed the resident's history and exam and pertinent patient test results.  I agree with the assessment, diagnosis and plan of care documented in the resident's note.     

## 2012-11-13 ENCOUNTER — Ambulatory Visit (INDEPENDENT_AMBULATORY_CARE_PROVIDER_SITE_OTHER): Payer: Medicaid Other | Admitting: Internal Medicine

## 2012-11-13 ENCOUNTER — Encounter: Payer: Self-pay | Admitting: Internal Medicine

## 2012-11-13 VITALS — BP 112/69 | HR 69 | Temp 97.6°F | Wt 113.0 lb

## 2012-11-13 DIAGNOSIS — F3289 Other specified depressive episodes: Secondary | ICD-10-CM

## 2012-11-13 DIAGNOSIS — L853 Xerosis cutis: Secondary | ICD-10-CM

## 2012-11-13 DIAGNOSIS — I10 Essential (primary) hypertension: Secondary | ICD-10-CM

## 2012-11-13 DIAGNOSIS — Z720 Tobacco use: Secondary | ICD-10-CM

## 2012-11-13 DIAGNOSIS — L738 Other specified follicular disorders: Secondary | ICD-10-CM

## 2012-11-13 DIAGNOSIS — F329 Major depressive disorder, single episode, unspecified: Secondary | ICD-10-CM

## 2012-11-13 DIAGNOSIS — N3941 Urge incontinence: Secondary | ICD-10-CM

## 2012-11-13 DIAGNOSIS — F17200 Nicotine dependence, unspecified, uncomplicated: Secondary | ICD-10-CM

## 2012-11-13 MED ORDER — OXYBUTYNIN CHLORIDE 5 MG PO TABS: 5.0000 mg | ORAL_TABLET | Freq: Every day | ORAL | Status: DC

## 2012-11-13 NOTE — Assessment & Plan Note (Addendum)
Wean Celexa: Take a half quarter tab (5mg ) for 5 days then stop  he'll let us know if he would like to start another antidepressant future. He denies any depressive symptoms currently

## 2012-11-13 NOTE — Assessment & Plan Note (Signed)
Well-controlled, continue current medications
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
## 2012-11-13 NOTE — Assessment & Plan Note (Signed)
Improved with the urea cream and decreased dose of oxybutynin Continue urea cream or over-the-counter moisturizer

## 2012-11-13 NOTE — Assessment & Plan Note (Signed)
Patient quit smoking in March 2014!!! This has much to do with his very supportive caregiver and sister [name] who has helped encourage the patient to quit and has slowly weaned down his cigarettes until he finally quit in March.

## 2012-11-13 NOTE — Patient Instructions (Signed)
   Slowly taper off Celexa. Take a quarter of a tab once per day for 5 days, then stop. If you have any withdrawal symptoms, please call us.  Otherwise, return to clinic in 3 months, sooner if needed.

## 2012-11-13 NOTE — Assessment & Plan Note (Signed)
Patient has had almost complete resolution of his urinary incontinence with the oxybutynin 5 mg daily. He seems to be doing well on this low dose. I would not make any changes at this point.

## 2012-11-14 NOTE — Progress Notes (Signed)
Case discussed with Dr.Kesty soon after the resident saw the patient.  We reviewed the resident's history and exam and pertinent patient test results.  I agree with the assessment, diagnosis and plan of care documented in the resident's note. 

## 2012-12-15 ENCOUNTER — Other Ambulatory Visit: Payer: Self-pay | Admitting: Neurosurgery

## 2012-12-15 DIAGNOSIS — M549 Dorsalgia, unspecified: Secondary | ICD-10-CM

## 2012-12-18 ENCOUNTER — Other Ambulatory Visit: Payer: Medicaid Other

## 2012-12-18 ENCOUNTER — Ambulatory Visit
Admission: RE | Admit: 2012-12-18 | Discharge: 2012-12-18 | Disposition: A | Discharge status: Home / Self Care | Payer: Medicaid Other | Source: Ambulatory Visit | Attending: Neurosurgery | Admitting: Neurosurgery

## 2012-12-18 VITALS — BP 139/77 | HR 63

## 2012-12-18 DIAGNOSIS — M549 Dorsalgia, unspecified: Secondary | ICD-10-CM

## 2012-12-18 MED ORDER — METHYLPREDNISOLONE ACETATE 40 MG/ML INJ SUSP (RADIOLOG
120.0000 mg | Freq: Once | INTRAMUSCULAR | Status: AC
  Administered 2012-12-18: 120 mg via EPIDURAL

## 2012-12-18 MED ORDER — IOHEXOL 180 MG/ML  SOLN
1.0000 mL | Freq: Once | INTRAMUSCULAR | Status: AC | PRN
  Administered 2012-12-18: 1 mL via EPIDURAL

## 2012-12-22 ENCOUNTER — Other Ambulatory Visit: Payer: Medicaid Other

## 2013-10-26 ENCOUNTER — Ambulatory Visit: Payer: Medicaid Other | Admitting: Surgery

## 2013-10-26 ENCOUNTER — Other Ambulatory Visit: Payer: Medicaid Other

## 2013-11-02 ENCOUNTER — Ambulatory Visit (INDEPENDENT_AMBULATORY_CARE_PROVIDER_SITE_OTHER): Payer: Medicaid Other | Admitting: Surgery

## 2013-11-02 ENCOUNTER — Ambulatory Visit
Admission: RE | Admit: 2013-11-02 | Discharge: 2013-11-02 | Disposition: A | Discharge status: Home / Self Care | Payer: Medicaid Other | Source: Ambulatory Visit | Attending: Vascular Surgery | Admitting: Vascular Surgery

## 2013-11-02 ENCOUNTER — Other Ambulatory Visit: Payer: Medicaid Other

## 2013-11-02 ENCOUNTER — Encounter: Payer: Self-pay | Admitting: Surgery

## 2013-11-02 ENCOUNTER — Inpatient Hospital Stay: Admission: RE | Admit: 2013-11-02 | Payer: Medicaid Other | Source: Ambulatory Visit

## 2013-11-02 VITALS — BP 116/61 | HR 75 | Ht 66.0 in | Wt 118.0 lb

## 2013-11-02 DIAGNOSIS — I712 Thoracic aortic aneurysm, without rupture, unspecified: Secondary | ICD-10-CM

## 2013-11-02 DIAGNOSIS — I739 Peripheral vascular disease, unspecified: Secondary | ICD-10-CM

## 2013-11-02 DIAGNOSIS — I6529 Occlusion and stenosis of unspecified carotid artery: Secondary | ICD-10-CM

## 2013-11-02 MED ORDER — IOHEXOL 350 MG/ML SOLN
80.0000 mL | Freq: Once | INTRAVENOUS | Status: AC | PRN
Start: 2013-11-02 — End: 2013-11-02
  Administered 2013-11-02: 80 mL via INTRAVENOUS

## 2013-11-02 NOTE — Addendum Note (Signed)
Addended by: Mena Goes on: 11/02/2013 04:54 PM   Modules accepted: Orders

## 2013-11-02 NOTE — Progress Notes (Signed)
    Established Abdominal Aortic Aneurysm  History of Present Illness  The patient is a 58 y.o. (03-26-1956) male who presents with chief complaint: follow up for TAA.  Previous studies demonstrate an TAA, measuring 4.75 cm.  The patient does not have back or abdominal pain.  The patient is currently a smoker.  The patient's PMH, PSH, SH, FamHx, Med, and Allergies are unchanged from 09/2012.  On ROS today: No fever, chills, n/v/d, short term memory loss, seizures, no abdominal pain.  Physical Examination  Filed Vitals:   11/02/13 1522  BP: 116/61  Pulse: 75  Height: 5\' 6"  (1.676 m)  Weight: 118 lb (53.524 kg)  SpO2: 98%   Body mass index is 19.05 kg/(m^2).  General: A&O x 3, WD appear,  Somulent,  Pulmonary: Sym exp, good air movt, CTAB, no rales, rhonchi, & wheezing,  Cardiac: RRR, Vascular: Vessel Right Left  Radial Palpable Palpable  Brachial Palpable Palpable  Carotid Palpable, without bruit Palpable, without bruit  Aorta Not palpable N/A  Femoral Palpable Palpable  Popliteal Not palpable Not palpable  PT Not Palpable Not Palpable  DP Not Palpable Not Palpable   Gastrointestinal: soft, NTND  Musculoskeletal: M/S 5/5 throughout  except right foot and ankle, Extremities without ischemic changes    Neurologic: CN 2-12 intactPain and light touch intact in extremities   Motor exam as listed above  Non-Invasive Vascular Imaging  CTA (11/02/2013)  The proximal descending thoracic aorta (at the level of  the left atrium) is stable at 4.0 cm. Stable maximal diameter of the  descending thoracic aorta at 4.9 x 4.6 cm   Medical Decision Making  The patient is a 58 y.o. male who presents with: nonsymptomatic TAA with increasing size to 4.9.  We will have him f/u in 6 months after a CTA of the chest and abdomin.    Dr. Trula Slade discussed the CTA and plan with them today.  He ordered carotid duplex and   [facility] [place]:  [phone]   11/02/2013, 4:00 PM   I agree with the above.  I have seen and examined the patient.  He has a 4.9 cm descending thoracic aortic aneurysm.  He is asymptomatic.  After reviewing the CT scan, I have recommended followup in 6 months with a repeat CT scan.  This will be of the chest abdomen and pelvis  Wells

## 2014-05-07 ENCOUNTER — Encounter: Payer: Self-pay | Admitting: Surgery

## 2014-05-10 ENCOUNTER — Ambulatory Visit (INDEPENDENT_AMBULATORY_CARE_PROVIDER_SITE_OTHER): Payer: Medicaid Other | Admitting: Surgery

## 2014-05-10 ENCOUNTER — Other Ambulatory Visit (HOSPITAL_COMMUNITY): Payer: Medicaid Other

## 2014-05-10 ENCOUNTER — Ambulatory Visit
Admission: RE | Admit: 2014-05-10 | Discharge: 2014-05-10 | Disposition: A | Discharge status: Home / Self Care | Payer: Medicaid Other | Source: Ambulatory Visit | Attending: Surgery | Admitting: Surgery

## 2014-05-10 ENCOUNTER — Encounter: Payer: Self-pay | Admitting: Surgery

## 2014-05-10 ENCOUNTER — Encounter (HOSPITAL_COMMUNITY): Payer: Medicaid Other

## 2014-05-10 VITALS — BP 117/68 | HR 78 | Temp 97.9°F | Resp 16 | Ht 66.0 in | Wt 106.3 lb

## 2014-05-10 DIAGNOSIS — I712 Thoracic aortic aneurysm, without rupture, unspecified: Secondary | ICD-10-CM

## 2014-05-10 DIAGNOSIS — Z01818 Encounter for other preprocedural examination: Secondary | ICD-10-CM

## 2014-05-10 MED ORDER — IOHEXOL 350 MG/ML SOLN
80.0000 mL | Freq: Once | INTRAVENOUS | Status: AC | PRN
  Administered 2014-05-10: 80 mL via INTRAVENOUS

## 2014-05-10 NOTE — Addendum Note (Signed)
Addended by: Mena Goes on: 05/10/2014 05:10 PM   Modules accepted: Orders
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
## 2014-05-19 ENCOUNTER — Other Ambulatory Visit (HOSPITAL_COMMUNITY): Payer: Medicaid Other

## 2014-05-19 ENCOUNTER — Encounter: Payer: Self-pay | Admitting: Surgery

## 2014-05-19 ENCOUNTER — Encounter (HOSPITAL_COMMUNITY): Payer: Medicaid Other

## 2014-05-19 ENCOUNTER — Ambulatory Visit (HOSPITAL_COMMUNITY)
Admission: RE | Admit: 2014-05-19 | Discharge: 2014-05-19 | Disposition: A | Discharge status: Home / Self Care | Payer: Medicaid Other | Source: Ambulatory Visit | Attending: Surgery | Admitting: Surgery

## 2014-05-19 ENCOUNTER — Ambulatory Visit (INDEPENDENT_AMBULATORY_CARE_PROVIDER_SITE_OTHER)
Admission: RE | Admit: 2014-05-19 | Discharge: 2014-05-19 | Disposition: A | Discharge status: Home / Self Care | Payer: Medicaid Other | Source: Ambulatory Visit | Attending: Surgery | Admitting: Surgery

## 2014-05-19 DIAGNOSIS — Z967 Presence of other bone and tendon implants: Secondary | ICD-10-CM | POA: Diagnosis not present

## 2014-05-19 DIAGNOSIS — I712 Thoracic aortic aneurysm, without rupture, unspecified: Secondary | ICD-10-CM

## 2014-05-19 DIAGNOSIS — Z01818 Encounter for other preprocedural examination: Secondary | ICD-10-CM | POA: Insufficient documentation

## 2014-05-19 DIAGNOSIS — I6523 Occlusion and stenosis of bilateral carotid arteries: Secondary | ICD-10-CM | POA: Insufficient documentation

## 2014-05-19 DIAGNOSIS — Z96643 Presence of artificial hip joint, bilateral: Secondary | ICD-10-CM | POA: Insufficient documentation

## 2014-05-24 ENCOUNTER — Telehealth: Payer: Self-pay | Admitting: Surgery

## 2014-05-24 ENCOUNTER — Ambulatory Visit (INDEPENDENT_AMBULATORY_CARE_PROVIDER_SITE_OTHER): Payer: Medicaid Other | Admitting: Surgery

## 2014-05-24 ENCOUNTER — Encounter: Payer: Self-pay | Admitting: Surgery

## 2014-05-24 VITALS — BP 136/80 | HR 58 | Temp 97.0°F | Resp 16 | Ht 66.0 in | Wt 109.0 lb

## 2014-05-24 DIAGNOSIS — Z01810 Encounter for preprocedural cardiovascular examination: Secondary | ICD-10-CM

## 2014-05-24 DIAGNOSIS — I712 Thoracic aortic aneurysm, without rupture, unspecified: Secondary | ICD-10-CM

## 2014-05-24 NOTE — Telephone Encounter (Signed)
Pt has memory issues - gave the following info to sister [name] who cares for him: Myoview 05/27/14 9 am and Cardiac appt Dr. Acie Fredrickson 06/01/14. Gave address and instructions. Said she may call to change time of appts if possible due to work situation. Knows to make both appts prior to 06/11/14 surgery.  Verbalized understanding.
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
## 2014-05-24 NOTE — Addendum Note (Signed)
Addended by: Mena Goes on: 05/24/2014 04:41 PM   Modules accepted: Orders

## 2014-05-25 ENCOUNTER — Other Ambulatory Visit: Payer: Self-pay

## 2014-05-25 ENCOUNTER — Telehealth: Payer: Self-pay | Admitting: Cardiovascular Disease

## 2014-05-25 NOTE — Telephone Encounter (Addendum)
Spoke with patient's sister, [name], who is patient's healthcare POA and will accompany patient to his appointment and answered her questions about the patient's upcoming nuclear stress test.  I reviewed the pre-test instructions and advised her that patient needs to hold Tenoretic on the morning of the procedure.  [name] verbalized understanding of all questions and I verified appointment time for nuclear test and for office visit with Dr. Acie Fredrickson.

## 2014-05-25 NOTE — Progress Notes (Signed)
Faxed progress note from office visit with Dr. Trula Slade to Dr. Mallie Mussel Tripp's office, per Michelle's (patients sister and POA) request.

## 2014-05-25 NOTE — Telephone Encounter (Signed)
New Msg   Patient sister calling to get more information about myocardial perfusion. She would like to be contacted at [phone] in regards to how long it will take and what exactly will be happening during procedure.

## 2014-05-26 ENCOUNTER — Telehealth: Payer: Self-pay | Admitting: Surgery

## 2014-05-26 NOTE — Telephone Encounter (Signed)
Spoke with [name] - told her Myoview changed to 05/31/14 11:30am  due to issue with Medicaid. She verbalized understanding.

## 2014-05-27 ENCOUNTER — Encounter (HOSPITAL_COMMUNITY): Payer: Medicaid Other

## 2014-05-31 ENCOUNTER — Ambulatory Visit (HOSPITAL_COMMUNITY): Payer: Medicaid Other | Attending: Internal Medicine | Admitting: Radiology

## 2014-05-31 DIAGNOSIS — Z01810 Encounter for preprocedural cardiovascular examination: Secondary | ICD-10-CM

## 2014-05-31 DIAGNOSIS — I712 Thoracic aortic aneurysm, without rupture, unspecified: Secondary | ICD-10-CM

## 2014-05-31 DIAGNOSIS — I10 Essential (primary) hypertension: Secondary | ICD-10-CM | POA: Diagnosis present

## 2014-05-31 DIAGNOSIS — E785 Hyperlipidemia, unspecified: Secondary | ICD-10-CM | POA: Diagnosis not present

## 2014-05-31 MED ORDER — TECHNETIUM TC 99M SESTAMIBI GENERIC - CARDIOLITE
10.0000 | Freq: Once | INTRAVENOUS | Status: AC | PRN
  Administered 2014-05-31: 10 via INTRAVENOUS

## 2014-05-31 MED ORDER — REGADENOSON 0.4 MG/5ML IV SOLN
0.4000 mg | Freq: Once | INTRAVENOUS | Status: AC
  Administered 2014-05-31: 0.4 mg via INTRAVENOUS

## 2014-05-31 MED ORDER — TECHNETIUM TC 99M SESTAMIBI GENERIC - CARDIOLITE
30.0000 | Freq: Once | INTRAVENOUS | Status: AC | PRN
  Administered 2014-05-31: 30 via INTRAVENOUS

## 2014-05-31 NOTE — Progress Notes (Signed)
[facility] [address] [place],  [address] [phone]    Cardiology Nuclear Med Study  [name] is a 58 y.o. male     MRN : [id number]     DOB: 08/19/1955  Procedure Date: 05/31/2014  Nuclear Med Background Indication for Stress Test:  Evaluation for Ischemia, and Surgical Clearance for Thoracic Endovascular Stent Graft on 06-11-2014 by Serafina Mitchell, MD History: No prior known history of CAD, and Thoracic Aortic Aneurysm 5.2cm Cardiac Risk Factors: Carotid Disease, Hypertension and Lipids  Symptoms:  None indicated   Nuclear Pre-Procedure Caffeine/Decaff Intake:  None> 12 hrs NPO After: 6:00pm   Lungs:  clear O2 Sat: 92% on room air. IV 0.9% NS with Angio Cath:  22g  IV Site: R Antecubital x 1, tolerated well IV Started by:  Irven Baltimore, RN  Chest Size (in):  36 Cup Size: n/a  Height: 5\' 6"  (1.676 m)  Weight:  105 lb (47.628 kg)  BMI:  Body mass index is 16.96 kg/(m^2). Tech Comments:  Patient held Tenoretic this am, and took Dilantin last night. Irven Baltimore, RN.    Nuclear Med Study 1 or 2 day study: 1 day  Stress Test Type:  Carlton Adam  Reading MD: N/A  Order Authorizing Provider:  Serafina Mitchell, MD  Resting Radionuclide: Technetium 76m Sestamibi  Resting Radionuclide Dose: 11.0 mCi   Stress Radionuclide:  Technetium 46m Sestamibi  Stress Radionuclide Dose: 33.0 mCi           Stress Protocol Rest HR: 60 Stress HR: 93  Rest BP: 133/81 Stress BP: 116/57  Exercise Time (min): n/a METS: n/a   Predicted Max HR: 164 bpm % Max HR: 56.71 bpm Rate Pressure Product: 12648   Dose of Adenosine (mg):  n/a Dose of Lexiscan: 0.4 mg  Dose of Atropine (mg): n/a Dose of Dobutamine: n/a mcg/kg/min (at max HR)  Stress Test Technologist: Glade Lloyd, BS-ES  Nuclear Technologist:  Earl Many, CNMT     Rest Procedure:  Myocardial perfusion imaging was performed at rest 45 minutes following the intravenous administration  of Technetium 37m Sestamibi. Rest ECG: NSR - Normal EKG  Stress Procedure:  The patient received IV Lexiscan 0.4 mg over 15-seconds.  Technetium 13m Sestamibi injected at 30-seconds.  Quantitative spect images were obtained after a 45 minute delay. Stress ECG: No significant change from baseline ECG  QPS Raw Data Images:  Normal; no motion artifact; normal heart/lung ratio. Stress Images:  Normal homogeneous uptake in all areas of the myocardium. Rest Images:  Normal homogeneous uptake in all areas of the myocardium. Subtraction (SDS):  No evidence of ischemia. Transient Ischemic Dilatation (Normal <1.22):  1.02 Lung/Heart Ratio (Normal <0.45):  0.25  Quantitative Gated Spect Images QGS EDV:  67 ml QGS ESV:  28 ml  Impression Exercise Capacity:  Lexiscan with no exercise. BP Response:  Normal blood pressure response. Clinical Symptoms:  No symptoms. ECG Impression:  No significant ST segment change suggestive of ischemia. Comparison with Prior Nuclear Study: No images to compare  Overall Impression:  Normal stress nuclear study.  LV Ejection Fraction: 58%.  LV Wall Motion:  NL LV Function; NL Wall Motion  Darlin Coco  MD

## 2014-06-01 ENCOUNTER — Ambulatory Visit (INDEPENDENT_AMBULATORY_CARE_PROVIDER_SITE_OTHER): Payer: Medicaid Other | Admitting: Cardiovascular Disease

## 2014-06-01 ENCOUNTER — Encounter: Payer: Self-pay | Admitting: Cardiovascular Disease

## 2014-06-01 VITALS — BP 116/72 | HR 71 | Ht 66.0 in | Wt 107.0 lb

## 2014-06-01 DIAGNOSIS — I712 Thoracic aortic aneurysm, without rupture, unspecified: Secondary | ICD-10-CM

## 2014-06-01 DIAGNOSIS — I10 Essential (primary) hypertension: Secondary | ICD-10-CM

## 2014-06-01 NOTE — Assessment & Plan Note (Signed)
Mr. [name] today for preoperative evaluation prior to having thoracic aortic surgery. The plan is to have a stent graft repair but there is a chance that he may need a standard repair.   He has already had his Lexiscan myoview study which was normal. No ischemia. Normal LV funciton He has no cardiac symptoms  Given his stable myoview results, he is not at any additional risk for his upcoming surgery.  He should be at low - moderate risk for surgery.    I will see him on an as needed basis.
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
## 2014-06-01 NOTE — Patient Instructions (Signed)
Your physician recommends that you continue on your current medications as directed. Please refer to the Current Medication list given to you today.  Your physician recommends that you schedule a follow-up appointment in: as needed with Dr. Nahser  

## 2014-06-04 ENCOUNTER — Other Ambulatory Visit (HOSPITAL_COMMUNITY): Payer: Self-pay | Admitting: *Deleted

## 2014-06-04 ENCOUNTER — Encounter (HOSPITAL_COMMUNITY): Payer: Self-pay

## 2014-06-04 ENCOUNTER — Encounter (HOSPITAL_COMMUNITY)
Admission: RE | Admit: 2014-06-04 | Discharge: 2014-06-04 | Disposition: A | Discharge status: Home / Self Care | Payer: Medicaid Other | Source: Ambulatory Visit | Attending: Surgery | Admitting: Surgery

## 2014-06-04 DIAGNOSIS — I712 Thoracic aortic aneurysm, without rupture: Secondary | ICD-10-CM | POA: Diagnosis not present

## 2014-06-04 DIAGNOSIS — I429 Cardiomyopathy, unspecified: Secondary | ICD-10-CM | POA: Insufficient documentation

## 2014-06-04 DIAGNOSIS — I071 Rheumatic tricuspid insufficiency: Secondary | ICD-10-CM | POA: Insufficient documentation

## 2014-06-04 DIAGNOSIS — S069X0S Unspecified intracranial injury without loss of consciousness, sequela: Secondary | ICD-10-CM | POA: Insufficient documentation

## 2014-06-04 DIAGNOSIS — F1021 Alcohol dependence, in remission: Secondary | ICD-10-CM | POA: Insufficient documentation

## 2014-06-04 DIAGNOSIS — R413 Other amnesia: Secondary | ICD-10-CM | POA: Insufficient documentation

## 2014-06-04 DIAGNOSIS — Z01818 Encounter for other preprocedural examination: Secondary | ICD-10-CM | POA: Diagnosis present

## 2014-06-04 DIAGNOSIS — E871 Hypo-osmolality and hyponatremia: Secondary | ICD-10-CM | POA: Insufficient documentation

## 2014-06-04 DIAGNOSIS — F17210 Nicotine dependence, cigarettes, uncomplicated: Secondary | ICD-10-CM | POA: Insufficient documentation

## 2014-06-04 DIAGNOSIS — R569 Unspecified convulsions: Secondary | ICD-10-CM | POA: Diagnosis not present

## 2014-06-04 HISTORY — DX: Radiculopathy, site unspecified: M54.10

## 2014-06-04 HISTORY — DX: Aortic aneurysm of unspecified site, without rupture: I71.9

## 2014-06-04 HISTORY — DX: Hyperlipidemia, unspecified: E78.5

## 2014-06-04 HISTORY — DX: Other intervertebral disc displacement, lumbar region: M51.26

## 2014-06-04 HISTORY — DX: Urgency of urination: R39.15

## 2014-06-04 LAB — CBC
HCT: 36.8 % — ABNORMAL LOW (ref 39.0–52.0)
Hemoglobin: 12.7 g/dL — ABNORMAL LOW (ref 13.0–17.0)
MCH: 31.2 pg (ref 26.0–34.0)
MCHC: 34.5 g/dL (ref 30.0–36.0)
MCV: 90.4 fL (ref 78.0–100.0)
Platelets: 146 10*3/uL — ABNORMAL LOW (ref 150–400)
RBC: 4.07 MIL/uL — ABNORMAL LOW (ref 4.22–5.81)
RDW: 12.4 % (ref 11.5–15.5)
WBC: 7.1 10*3/uL (ref 4.0–10.5)

## 2014-06-04 LAB — PROTIME-INR
INR: 1.07 (ref 0.00–1.49)
Prothrombin Time: 14 seconds (ref 11.6–15.2)

## 2014-06-04 LAB — BLOOD GAS, ARTERIAL
Acid-Base Excess: 7.3 mmol/L — ABNORMAL HIGH (ref 0.0–2.0)
Bicarbonate: 31.3 mEq/L — ABNORMAL HIGH (ref 20.0–24.0)
DRAWN BY: 42180
FIO2: 0.21 %
O2 Saturation: 96 %
PATIENT TEMPERATURE: 98.6
TCO2: 32.7 mmol/L (ref 0–100)
pCO2 arterial: 44.2 mmHg (ref 35.0–45.0)
pH, Arterial: 7.464 — ABNORMAL HIGH (ref 7.350–7.450)
pO2, Arterial: 77.2 mmHg — ABNORMAL LOW (ref 80.0–100.0)

## 2014-06-04 LAB — URINALYSIS, ROUTINE W REFLEX MICROSCOPIC
Bilirubin Urine: NEGATIVE
Glucose, UA: NEGATIVE mg/dL
HGB URINE DIPSTICK: NEGATIVE
KETONES UR: NEGATIVE mg/dL
Leukocytes, UA: NEGATIVE
Nitrite: NEGATIVE
PROTEIN: NEGATIVE mg/dL
Specific Gravity, Urine: 1.013 (ref 1.005–1.030)
UROBILINOGEN UA: 0.2 mg/dL (ref 0.0–1.0)
pH: 6 (ref 5.0–8.0)

## 2014-06-04 LAB — COMPREHENSIVE METABOLIC PANEL
ALBUMIN: 4.1 g/dL (ref 3.5–5.2)
ALK PHOS: 98 U/L (ref 39–117)
ALT: 17 U/L (ref 0–53)
ANION GAP: 16 — AB (ref 5–15)
AST: 23 U/L (ref 0–37)
BUN: 13 mg/dL (ref 6–23)
CO2: 25 mEq/L (ref 19–32)
Calcium: 9.5 mg/dL (ref 8.4–10.5)
Chloride: 88 mEq/L — ABNORMAL LOW (ref 96–112)
Creatinine, Ser: 0.53 mg/dL (ref 0.50–1.35)
GFR calc Af Amer: >90 mL/min (ref >90)
GFR calc non Af Amer: >90 mL/min (ref >90)
Glucose, Bld: 90 mg/dL (ref 70–99)
POTASSIUM: 3.8 meq/L (ref 3.7–5.3)
Sodium: 129 mEq/L — ABNORMAL LOW (ref 137–147)
Total Bilirubin: <0.2 mg/dL — ABNORMAL LOW (ref 0.3–1.2)
Total Protein: 7.8 g/dL (ref 6.0–8.3)

## 2014-06-04 LAB — TYPE AND SCREEN
ABO/RH(D): A POS
ANTIBODY SCREEN: NEGATIVE

## 2014-06-04 LAB — SURGICAL PCR SCREEN
MRSA, PCR: NEGATIVE
STAPHYLOCOCCUS AUREUS: NEGATIVE

## 2014-06-04 LAB — APTT: aPTT: 29 seconds (ref 24–37)

## 2014-06-04 NOTE — Progress Notes (Signed)
Pt's sister assisted with giving history during PAT appt, pt has short term memory issues. Pt denies chest pain or sob. Pt lives in an assisted living.

## 2014-06-04 NOTE — Pre-Procedure Instructions (Signed)
[name]  06/04/2014   Your procedure is scheduled on:  Friday, June 11, 2014 at 8:00 AM.   Report to [facility] Entrance "A" Admitting Office at 6:00 AM.   Call this number if you have problems the morning of surgery: [phone]                Any questions prior to day of surgery, please call [phone] between 8 & 4 PM.   Remember:   Do not eat food or drink liquids after midnight Thursday, 06/10/14.   Take these medicines the morning of surgery with A SIP OF WATER: acetaminophen (TYLENOL), atenolol-chlorthalidone (TENORETIC)  Stop Vitamins as of today   Do not wear jewelry.  Do not wear lotions, powders, or cologne. You may NOT wear deodorant.  Men may shave face and neck.  Do not bring valuables to the hospital.  [facility] is not responsible                  for any belongings or valuables.               Contacts, dentures or bridgework may not be worn into surgery.  Leave suitcase in the car. After surgery it may be brought to your room.  For patients admitted to the hospital, discharge time is determined by your                treatment team.        Special Instructions:[facility] - Preparing for Surgery  Before surgery, you can play an important role.  Because skin is not sterile, your skin needs to be as free of germs as possible.  You can reduce the number of germs on you skin by washing with CHG (chlorahexidine gluconate) soap before surgery.  CHG is an antiseptic cleaner which kills germs and bonds with the skin to continue killing germs even after washing.  Please DO NOT use if you have an allergy to CHG or antibacterial soaps.  If your skin becomes reddened/irritated stop using the CHG and inform your nurse when you arrive at Short Stay.  Do not shave (including legs and underarms) for at least 48 hours prior to the first CHG shower.  You may shave your face.  Please follow these instructions carefully:   1.  Shower with CHG Soap the  night before surgery and the                                morning of Surgery.  2.  If you choose to wash your hair, wash your hair first as usual with your       normal shampoo.  3.  After you shampoo, rinse your hair and body thoroughly to remove the                      Shampoo.  4.  Use CHG as you would any other liquid soap.  You can apply chg directly       to the skin and wash gently with scrungie or a clean washcloth.  5.  Apply the CHG Soap to your body ONLY FROM THE NECK DOWN.        Do not use on open wounds or open sores.  Avoid contact with your eyes, ears, mouth and genitals (private parts).  Wash genitals (private parts) with your normal soap.  6.  Wash  thoroughly, paying special attention to the area where your surgery        will be performed.  7.  Thoroughly rinse your body with warm water from the neck down.  8.  DO NOT shower/wash with your normal soap after using and rinsing off       the CHG Soap.  9.  Pat yourself dry with a clean towel.            10.  Wear clean pajamas.            11.  Place clean sheets on your bed the night of your first shower and do not        sleep with pets.  Day of Surgery  Do not apply any lotions/deodorants the morning of surgery.  Please wear clean clothes to the hospital.     Please read over the following fact sheets that you were given: Pain Booklet, Coughing and Deep Breathing, Blood Transfusion Information, MRSA Information and Surgical Site Infection Prevention
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
## 2014-06-10 MED ORDER — DEXTROSE 5 % IV SOLN
1.5000 g | INTRAVENOUS | Status: AC
  Administered 2014-06-11: 1.5 g via INTRAVENOUS
  Filled 2014-06-10: qty 1.5

## 2014-06-11 ENCOUNTER — Encounter (HOSPITAL_COMMUNITY)
Admission: RE | Disposition: A | Discharge status: Home / Self Care | Payer: Self-pay | Source: Ambulatory Visit | Attending: Surgery

## 2014-06-11 ENCOUNTER — Encounter (HOSPITAL_COMMUNITY): Payer: Self-pay | Admitting: Certified Registered"

## 2014-06-11 ENCOUNTER — Inpatient Hospital Stay (HOSPITAL_COMMUNITY)
Admission: RE | Admit: 2014-06-11 | Discharge: 2014-06-13 | DRG: 220 | Disposition: A | Discharge status: Home / Self Care | Payer: Medicaid Other | Source: Ambulatory Visit | Attending: Surgery | Admitting: Surgery

## 2014-06-11 ENCOUNTER — Other Ambulatory Visit: Payer: Self-pay | Admitting: *Deleted

## 2014-06-11 ENCOUNTER — Inpatient Hospital Stay (HOSPITAL_COMMUNITY): Payer: Medicaid Other | Admitting: Certified Registered"

## 2014-06-11 ENCOUNTER — Inpatient Hospital Stay (HOSPITAL_COMMUNITY): Payer: Medicaid Other

## 2014-06-11 ENCOUNTER — Inpatient Hospital Stay (HOSPITAL_COMMUNITY): Payer: Medicaid Other | Admitting: Vascular Surgery

## 2014-06-11 DIAGNOSIS — Z9889 Other specified postprocedural states: Secondary | ICD-10-CM

## 2014-06-11 DIAGNOSIS — F17210 Nicotine dependence, cigarettes, uncomplicated: Secondary | ICD-10-CM | POA: Diagnosis present

## 2014-06-11 DIAGNOSIS — I255 Ischemic cardiomyopathy: Secondary | ICD-10-CM | POA: Diagnosis not present

## 2014-06-11 DIAGNOSIS — I10 Essential (primary) hypertension: Secondary | ICD-10-CM | POA: Diagnosis present

## 2014-06-11 DIAGNOSIS — I712 Thoracic aortic aneurysm, without rupture, unspecified: Secondary | ICD-10-CM | POA: Diagnosis present

## 2014-06-11 DIAGNOSIS — E871 Hypo-osmolality and hyponatremia: Secondary | ICD-10-CM | POA: Diagnosis present

## 2014-06-11 DIAGNOSIS — E785 Hyperlipidemia, unspecified: Secondary | ICD-10-CM | POA: Diagnosis present

## 2014-06-11 DIAGNOSIS — I716 Thoracoabdominal aortic aneurysm, without rupture, unspecified: Secondary | ICD-10-CM

## 2014-06-11 DIAGNOSIS — F1010 Alcohol abuse, uncomplicated: Secondary | ICD-10-CM | POA: Diagnosis present

## 2014-06-11 DIAGNOSIS — Z95828 Presence of other vascular implants and grafts: Secondary | ICD-10-CM

## 2014-06-11 DIAGNOSIS — R569 Unspecified convulsions: Secondary | ICD-10-CM | POA: Diagnosis present

## 2014-06-11 DIAGNOSIS — R3915 Urgency of urination: Secondary | ICD-10-CM | POA: Diagnosis present

## 2014-06-11 HISTORY — PX: THORACIC AORTIC ENDOVASCULAR STENT GRAFT: SHX6112

## 2014-06-11 LAB — POCT I-STAT 4, (NA,K, GLUC, HGB,HCT)
Glucose, Bld: 94 mg/dL (ref 70–99)
HCT: 38 % — ABNORMAL LOW (ref 39.0–52.0)
HEMOGLOBIN: 12.9 g/dL — AB (ref 13.0–17.0)
Potassium: 3.3 mEq/L — ABNORMAL LOW (ref 3.7–5.3)
Sodium: 131 mEq/L — ABNORMAL LOW (ref 137–147)

## 2014-06-11 LAB — BASIC METABOLIC PANEL
Anion gap: 12 (ref 5–15)
BUN: 11 mg/dL (ref 6–23)
CO2: 28 mEq/L (ref 19–32)
Calcium: 8.6 mg/dL (ref 8.4–10.5)
Chloride: 91 mEq/L — ABNORMAL LOW (ref 96–112)
Creatinine, Ser: 0.57 mg/dL (ref 0.50–1.35)
Glucose, Bld: 88 mg/dL (ref 70–99)
Potassium: 3.3 mEq/L — ABNORMAL LOW (ref 3.7–5.3)
SODIUM: 131 meq/L — AB (ref 137–147)

## 2014-06-11 LAB — CBC
HEMATOCRIT: 30.9 % — AB (ref 39.0–52.0)
HEMOGLOBIN: 10.6 g/dL — AB (ref 13.0–17.0)
MCH: 29.8 pg (ref 26.0–34.0)
MCHC: 34.3 g/dL (ref 30.0–36.0)
MCV: 86.8 fL (ref 78.0–100.0)
Platelets: 147 10*3/uL — ABNORMAL LOW (ref 150–400)
RBC: 3.56 MIL/uL — ABNORMAL LOW (ref 4.22–5.81)
RDW: 12.3 % (ref 11.5–15.5)
WBC: 7.3 10*3/uL (ref 4.0–10.5)

## 2014-06-11 LAB — APTT: aPTT: 40 seconds — ABNORMAL HIGH (ref 24–37)

## 2014-06-11 LAB — PROTIME-INR
INR: 1.32 (ref 0.00–1.49)
PROTHROMBIN TIME: 16.5 s — AB (ref 11.6–15.2)

## 2014-06-11 LAB — MAGNESIUM: Magnesium: 1.8 mg/dL (ref 1.5–2.5)

## 2014-06-11 SURGERY — INSERTION, ENDOVASCULAR STENT GRAFT, AORTA, THORACIC
Anesthesia: General | Site: Chest

## 2014-06-11 MED ORDER — ONDANSETRON HCL 4 MG/2ML IJ SOLN: 4.0000 mg | Freq: Four times a day (QID) | INTRAMUSCULAR | Status: DC | PRN

## 2014-06-11 MED ORDER — MAGNESIUM SULFATE 2 GM/50ML IV SOLN: 2.0000 g | Freq: Every day | INTRAVENOUS | Status: DC | PRN

## 2014-06-11 MED ORDER — 0.9 % SODIUM CHLORIDE (POUR BTL) OPTIME
TOPICAL | Status: DC | PRN
  Administered 2014-06-11: 1000 mL

## 2014-06-11 MED ORDER — DEXTROSE 5 % IV SOLN
10.0000 mg | INTRAVENOUS | Status: DC | PRN
  Administered 2014-06-11: 25 ug/min via INTRAVENOUS

## 2014-06-11 MED ORDER — LIDOCAINE HCL (CARDIAC) 20 MG/ML IV SOLN
INTRAVENOUS | Status: AC
  Filled 2014-06-11: qty 10

## 2014-06-11 MED ORDER — PHENYTOIN SODIUM EXTENDED 30 MG PO CAPS
260.0000 mg | ORAL_CAPSULE | Freq: Every day | ORAL | Status: DC
  Administered 2014-06-11 – 2014-06-12 (×2): 260 mg via ORAL
  Filled 2014-06-11 (×5): qty 2

## 2014-06-11 MED ORDER — OXYBUTYNIN CHLORIDE 5 MG PO TABS
5.0000 mg | ORAL_TABLET | Freq: Every day | ORAL | Status: DC
  Administered 2014-06-11 – 2014-06-12 (×2): 5 mg via ORAL
  Filled 2014-06-11 (×3): qty 1

## 2014-06-11 MED ORDER — SODIUM CHLORIDE 0.9 % IV SOLN
INTRAVENOUS | Status: DC
  Administered 2014-06-11: 1000 mL via INTRAVENOUS

## 2014-06-11 MED ORDER — MIDAZOLAM HCL 5 MG/5ML IJ SOLN
INTRAMUSCULAR | Status: DC | PRN
  Administered 2014-06-11: 1 mg via INTRAVENOUS

## 2014-06-11 MED ORDER — PROPOFOL 10 MG/ML IV BOLUS
INTRAVENOUS | Status: AC
  Filled 2014-06-11: qty 20

## 2014-06-11 MED ORDER — SODIUM CHLORIDE 0.9 % IJ SOLN
INTRAMUSCULAR | Status: AC
  Filled 2014-06-11: qty 10

## 2014-06-11 MED ORDER — HYDROMORPHONE HCL 1 MG/ML IJ SOLN
0.2500 mg | INTRAMUSCULAR | Status: DC | PRN
  Administered 2014-06-11: 1 mg via INTRAVENOUS

## 2014-06-11 MED ORDER — PROTAMINE SULFATE 10 MG/ML IV SOLN
INTRAVENOUS | Status: DC | PRN
  Administered 2014-06-11: 10 mg via INTRAVENOUS
  Administered 2014-06-11 (×2): 20 mg via INTRAVENOUS

## 2014-06-11 MED ORDER — MIDAZOLAM HCL 2 MG/2ML IJ SOLN
INTRAMUSCULAR | Status: AC
  Filled 2014-06-11: qty 2

## 2014-06-11 MED ORDER — ATENOLOL-CHLORTHALIDONE 50-25 MG PO TABS: 1.0000 | ORAL_TABLET | Freq: Every day | ORAL | Status: DC

## 2014-06-11 MED ORDER — UREA 39 % EX CREA: 1.0000 "application " | TOPICAL_CREAM | Freq: Two times a day (BID) | CUTANEOUS | Status: DC

## 2014-06-11 MED ORDER — ADULT MULTIVITAMIN W/MINERALS CH
1.0000 | ORAL_TABLET | Freq: Every day | ORAL | Status: DC
  Administered 2014-06-12: 1 via ORAL
  Filled 2014-06-11 (×2): qty 1

## 2014-06-11 MED ORDER — POTASSIUM CHLORIDE CRYS ER 20 MEQ PO TBCR: 20.0000 meq | EXTENDED_RELEASE_TABLET | Freq: Every day | ORAL | Status: DC | PRN

## 2014-06-11 MED ORDER — ENSURE COMPLETE PO LIQD
237.0000 mL | Freq: Every day | ORAL | Status: DC
  Administered 2014-06-11 – 2014-06-12 (×2): 237 mL via ORAL

## 2014-06-11 MED ORDER — ENOXAPARIN SODIUM 30 MG/0.3ML ~~LOC~~ SOLN
30.0000 mg | SUBCUTANEOUS | Status: DC
  Administered 2014-06-12: 30 mg via SUBCUTANEOUS
  Filled 2014-06-11 (×2): qty 0.3

## 2014-06-11 MED ORDER — FENTANYL CITRATE 0.05 MG/ML IJ SOLN
INTRAMUSCULAR | Status: DC | PRN
  Administered 2014-06-11: 100 ug via INTRAVENOUS
  Administered 2014-06-11 (×2): 50 ug via INTRAVENOUS

## 2014-06-11 MED ORDER — PRAVASTATIN SODIUM 40 MG PO TABS
40.0000 mg | ORAL_TABLET | Freq: Every evening | ORAL | Status: DC
  Administered 2014-06-11 – 2014-06-12 (×2): 40 mg via ORAL
  Filled 2014-06-11 (×3): qty 1

## 2014-06-11 MED ORDER — OXYCODONE-ACETAMINOPHEN 5-325 MG PO TABS
1.0000 | ORAL_TABLET | ORAL | Status: DC | PRN
  Administered 2014-06-12 – 2014-06-13 (×3): 2 via ORAL
  Filled 2014-06-11 (×3): qty 2

## 2014-06-11 MED ORDER — DOCUSATE SODIUM 100 MG PO CAPS
100.0000 mg | ORAL_CAPSULE | Freq: Every day | ORAL | Status: DC
  Administered 2014-06-12: 100 mg via ORAL
  Filled 2014-06-11: qty 1

## 2014-06-11 MED ORDER — GLYCOPYRROLATE 0.2 MG/ML IJ SOLN
INTRAMUSCULAR | Status: DC | PRN
  Administered 2014-06-11: .4 mg via INTRAVENOUS
  Administered 2014-06-11: 0.2 mg via INTRAVENOUS

## 2014-06-11 MED ORDER — HYDROMORPHONE HCL 1 MG/ML IJ SOLN
INTRAMUSCULAR | Status: AC
  Filled 2014-06-11: qty 1

## 2014-06-11 MED ORDER — CHLORTHALIDONE 25 MG PO TABS
25.0000 mg | ORAL_TABLET | Freq: Every day | ORAL | Status: DC
  Administered 2014-06-12: 25 mg via ORAL
  Filled 2014-06-11 (×2): qty 1

## 2014-06-11 MED ORDER — LIDOCAINE HCL (CARDIAC) 20 MG/ML IV SOLN
INTRAVENOUS | Status: DC | PRN
  Administered 2014-06-11: 80 mg via INTRAVENOUS

## 2014-06-11 MED ORDER — IODIXANOL 320 MG/ML IV SOLN
INTRAVENOUS | Status: DC | PRN
Start: 2014-06-11 — End: 2014-06-11
  Administered 2014-06-11: 56 mL

## 2014-06-11 MED ORDER — ALUM & MAG HYDROXIDE-SIMETH 200-200-20 MG/5ML PO SUSP: 15.0000 mL | ORAL | Status: DC | PRN

## 2014-06-11 MED ORDER — METOPROLOL TARTRATE 1 MG/ML IV SOLN: 2.0000 mg | INTRAVENOUS | Status: DC | PRN

## 2014-06-11 MED ORDER — ACETAMINOPHEN 650 MG RE SUPP: 325.0000 mg | RECTAL | Status: DC | PRN

## 2014-06-11 MED ORDER — ARTIFICIAL TEARS OP OINT
TOPICAL_OINTMENT | OPHTHALMIC | Status: AC
  Filled 2014-06-11: qty 3.5

## 2014-06-11 MED ORDER — NEOSTIGMINE METHYLSULFATE 10 MG/10ML IV SOLN
INTRAVENOUS | Status: DC | PRN
Start: 2014-06-11 — End: 2014-06-11
  Administered 2014-06-11: 3 mg via INTRAVENOUS

## 2014-06-11 MED ORDER — LACTATED RINGERS IV SOLN
INTRAVENOUS | Status: DC | PRN
  Administered 2014-06-11 (×2): via INTRAVENOUS

## 2014-06-11 MED ORDER — ROCURONIUM BROMIDE 100 MG/10ML IV SOLN
INTRAVENOUS | Status: DC | PRN
  Administered 2014-06-11: 40 mg via INTRAVENOUS

## 2014-06-11 MED ORDER — MORPHINE SULFATE 2 MG/ML IJ SOLN
2.0000 mg | INTRAMUSCULAR | Status: DC | PRN
  Administered 2014-06-11: 4 mg via INTRAVENOUS
  Administered 2014-06-12: 2 mg via INTRAVENOUS
  Filled 2014-06-11: qty 2
  Filled 2014-06-11: qty 1

## 2014-06-11 MED ORDER — OXYCODONE-ACETAMINOPHEN 5-325 MG PO TABS: 1.0000 | ORAL_TABLET | Freq: Four times a day (QID) | ORAL | Status: DC | PRN

## 2014-06-11 MED ORDER — BISACODYL 10 MG RE SUPP: 10.0000 mg | Freq: Every day | RECTAL | Status: DC | PRN

## 2014-06-11 MED ORDER — PROTAMINE SULFATE 10 MG/ML IV SOLN
INTRAVENOUS | Status: AC
  Filled 2014-06-11: qty 10

## 2014-06-11 MED ORDER — PROPOFOL 10 MG/ML IV BOLUS
INTRAVENOUS | Status: DC | PRN
  Administered 2014-06-11: 160 mg via INTRAVENOUS

## 2014-06-11 MED ORDER — GUAIFENESIN-DM 100-10 MG/5ML PO SYRP: 15.0000 mL | ORAL_SOLUTION | ORAL | Status: DC | PRN

## 2014-06-11 MED ORDER — HYDRALAZINE HCL 20 MG/ML IJ SOLN
INTRAMUSCULAR | Status: AC
  Filled 2014-06-11: qty 1

## 2014-06-11 MED ORDER — LABETALOL HCL 5 MG/ML IV SOLN: 10.0000 mg | INTRAVENOUS | Status: DC | PRN

## 2014-06-11 MED ORDER — PHENYTOIN SODIUM EXTENDED 30 MG PO CAPS: 60.0000 mg | ORAL_CAPSULE | Freq: Every day | ORAL | Status: DC

## 2014-06-11 MED ORDER — ROCURONIUM BROMIDE 50 MG/5ML IV SOLN
INTRAVENOUS | Status: AC
  Filled 2014-06-11: qty 1

## 2014-06-11 MED ORDER — ACETAMINOPHEN 325 MG PO TABS: 325.0000 mg | ORAL_TABLET | ORAL | Status: DC | PRN

## 2014-06-11 MED ORDER — CEFUROXIME SODIUM 1.5 G IJ SOLR
1.5000 g | Freq: Two times a day (BID) | INTRAMUSCULAR | Status: AC
  Administered 2014-06-11 – 2014-06-12 (×2): 1.5 g via INTRAVENOUS
  Filled 2014-06-11 (×2): qty 1.5

## 2014-06-11 MED ORDER — PHENYTOIN SODIUM EXTENDED 30 MG PO CAPS
260.0000 mg | ORAL_CAPSULE | Freq: Every day | ORAL | Status: DC
  Filled 2014-06-11 (×2): qty 2

## 2014-06-11 MED ORDER — SENNOSIDES-DOCUSATE SODIUM 8.6-50 MG PO TABS
1.0000 | ORAL_TABLET | Freq: Every evening | ORAL | Status: DC | PRN
  Filled 2014-06-11: qty 1

## 2014-06-11 MED ORDER — FENTANYL CITRATE 0.05 MG/ML IJ SOLN
INTRAMUSCULAR | Status: AC
  Filled 2014-06-11: qty 5

## 2014-06-11 MED ORDER — HYDRALAZINE HCL 20 MG/ML IJ SOLN
5.0000 mg | INTRAMUSCULAR | Status: DC | PRN
  Administered 2014-06-11: 5 mg via INTRAVENOUS

## 2014-06-11 MED ORDER — PHENOL 1.4 % MT LIQD: 1.0000 | OROMUCOSAL | Status: DC | PRN

## 2014-06-11 MED ORDER — ATENOLOL 50 MG PO TABS
50.0000 mg | ORAL_TABLET | Freq: Every day | ORAL | Status: DC
  Administered 2014-06-12: 50 mg via ORAL
  Filled 2014-06-11 (×2): qty 1

## 2014-06-11 MED ORDER — ARTIFICIAL TEARS OP OINT
TOPICAL_OINTMENT | OPHTHALMIC | Status: DC | PRN
  Administered 2014-06-11: 1 via OPHTHALMIC

## 2014-06-11 MED ORDER — EPHEDRINE SULFATE 50 MG/ML IJ SOLN
INTRAMUSCULAR | Status: AC
  Filled 2014-06-11: qty 1

## 2014-06-11 MED ORDER — SODIUM CHLORIDE 0.9 % IV SOLN: 500.0000 mL | Freq: Once | INTRAVENOUS | Status: AC | PRN

## 2014-06-11 MED ORDER — HEPARIN SODIUM (PORCINE) 1000 UNIT/ML IJ SOLN
INTRAMUSCULAR | Status: AC
  Filled 2014-06-11: qty 1

## 2014-06-11 MED ORDER — PANTOPRAZOLE SODIUM 40 MG PO TBEC
40.0000 mg | DELAYED_RELEASE_TABLET | Freq: Every day | ORAL | Status: DC
  Administered 2014-06-11 – 2014-06-12 (×2): 40 mg via ORAL
  Filled 2014-06-11 (×2): qty 1

## 2014-06-11 MED ORDER — ONDANSETRON HCL 4 MG/2ML IJ SOLN
INTRAMUSCULAR | Status: DC | PRN
  Administered 2014-06-11: 4 mg via INTRAVENOUS

## 2014-06-11 MED ORDER — SODIUM CHLORIDE 0.9 % IR SOLN
Status: DC | PRN
  Administered 2014-06-11: 500 mL

## 2014-06-11 MED ORDER — PHENYTOIN SODIUM EXTENDED 100 MG PO CAPS: 200.0000 mg | ORAL_CAPSULE | Freq: Every day | ORAL | Status: DC

## 2014-06-11 MED ORDER — HEPARIN SODIUM (PORCINE) 1000 UNIT/ML IJ SOLN
INTRAMUSCULAR | Status: DC | PRN
  Administered 2014-06-11: 4500 [IU] via INTRAVENOUS

## 2014-06-11 SURGICAL SUPPLY — 69 items
BAG BANDED W/RUBBER/TAPE 36X54 (MISCELLANEOUS) ×6 IMPLANT
BAG SNAP BAND KOVER 36X36 (MISCELLANEOUS) ×9 IMPLANT
CANISTER SUCTION 2500CC (MISCELLANEOUS) ×3 IMPLANT
CATH ACCU-VU SIZ PIG 5F 100CM (CATHETERS) ×6 IMPLANT
CLIP TI MEDIUM 24 (CLIP) IMPLANT
CLIP TI WIDE RED SMALL 24 (CLIP) IMPLANT
COVER DOME SNAP 22 D (MISCELLANEOUS) ×3 IMPLANT
COVER MAYO STAND STRL (DRAPES) ×3 IMPLANT
COVER PROBE W GEL 5X96 (DRAPES) ×3 IMPLANT
COVER SURGICAL LIGHT HANDLE (MISCELLANEOUS) ×3 IMPLANT
DERMABOND ADHESIVE PROPEN (GAUZE/BANDAGES/DRESSINGS) ×2
DERMABOND ADVANCED .7 DNX6 (GAUZE/BANDAGES/DRESSINGS) ×1 IMPLANT
DEVICE CLOSURE PERCLS PRGLD 6F (VASCULAR PRODUCTS) ×3 IMPLANT
DRAPE TABLE COVER HEAVY DUTY (DRAPES) ×3 IMPLANT
DRSG TEGADERM 2-3/8X2-3/4 SM (GAUZE/BANDAGES/DRESSINGS) ×3 IMPLANT
DRYSEAL FLEXSHEATH 24FR 33CM (SHEATH) ×2
ELECT REM PT RETURN 9FT ADLT (ELECTROSURGICAL) ×6
ELECTRODE REM PT RTRN 9FT ADLT (ELECTROSURGICAL) ×2 IMPLANT
ENDOPROSTHESIS THORAC 40X40X20 (Endovascular Graft) ×2 IMPLANT
ENDOPROTH THORACIC 40X40X20 (Endovascular Graft) ×6 IMPLANT
GAUZE SPONGE 2X2 8PLY STRL LF (GAUZE/BANDAGES/DRESSINGS) ×1 IMPLANT
GLOVE BIOGEL PI IND STRL 6.5 (GLOVE) ×2 IMPLANT
GLOVE BIOGEL PI IND STRL 7.0 (GLOVE) ×1 IMPLANT
GLOVE BIOGEL PI IND STRL 7.5 (GLOVE) ×1 IMPLANT
GLOVE BIOGEL PI INDICATOR 6.5 (GLOVE) ×4
GLOVE BIOGEL PI INDICATOR 7.0 (GLOVE) ×2
GLOVE BIOGEL PI INDICATOR 7.5 (GLOVE) ×2
GLOVE SURG SS PI 7.5 STRL IVOR (GLOVE) ×3 IMPLANT
GOWN STRL REUS W/ TWL LRG LVL3 (GOWN DISPOSABLE) ×2 IMPLANT
GOWN STRL REUS W/ TWL XL LVL3 (GOWN DISPOSABLE) ×1 IMPLANT
GOWN STRL REUS W/TWL LRG LVL3 (GOWN DISPOSABLE) ×4
GOWN STRL REUS W/TWL XL LVL3 (GOWN DISPOSABLE) ×2
GRAFT BALLN CATH 65CM (STENTS) IMPLANT
HEMOSTAT SNOW SURGICEL 2X4 (HEMOSTASIS) IMPLANT
INSERT FOGARTY 61MM (MISCELLANEOUS) IMPLANT
INSERT FOGARTY SM (MISCELLANEOUS) IMPLANT
KIT BASIN OR (CUSTOM PROCEDURE TRAY) ×3 IMPLANT
KIT ROOM TURNOVER OR (KITS) ×3 IMPLANT
LIQUID BAND (GAUZE/BANDAGES/DRESSINGS) ×3 IMPLANT
NEEDLE PERC 18GX7CM (NEEDLE) ×3 IMPLANT
NS IRRIG 1000ML POUR BTL (IV SOLUTION) ×3 IMPLANT
PACK AORTA (CUSTOM PROCEDURE TRAY) ×3 IMPLANT
PAD ARMBOARD 7.5X6 YLW CONV (MISCELLANEOUS) ×6 IMPLANT
PENCIL BUTTON HOLSTER BLD 10FT (ELECTRODE) IMPLANT
PERCLOSE PROGLIDE 6F (VASCULAR PRODUCTS) ×9
PROTECTION STATION PRESSURIZED (MISCELLANEOUS) ×3
SHEATH AVANTI 11CM 8FR (MISCELLANEOUS) ×3 IMPLANT
SHEATH DRYSEAL FLEX 24FR 33CM (SHEATH) ×1 IMPLANT
SPONGE GAUZE 2X2 STER 10/PKG (GAUZE/BANDAGES/DRESSINGS) ×2
STATION PROTECTION PRESSURIZED (MISCELLANEOUS) ×1 IMPLANT
STENT GRAFT BALLN CATH 65CM (STENTS)
STOPCOCK MORSE 400PSI 3WAY (MISCELLANEOUS) ×3 IMPLANT
SUT ETHILON 3 0 PS 1 (SUTURE) IMPLANT
SUT PROLENE 5 0 C 1 24 (SUTURE) IMPLANT
SUT VIC AB 2-0 CT1 27 (SUTURE)
SUT VIC AB 2-0 CT1 TAPERPNT 27 (SUTURE) IMPLANT
SUT VIC AB 3-0 SH 27 (SUTURE)
SUT VIC AB 3-0 SH 27X BRD (SUTURE) IMPLANT
SUT VICRYL 4-0 PS2 18IN ABS (SUTURE) ×6 IMPLANT
SYR 20CC LL (SYRINGE) ×6 IMPLANT
SYR 30ML LL (SYRINGE) IMPLANT
SYR 5ML LL (SYRINGE) IMPLANT
SYR MEDRAD MARK V 150ML (SYRINGE) ×3 IMPLANT
SYRINGE 10CC LL (SYRINGE) ×9 IMPLANT
TRAY FOLEY CATH 16FRSI W/METER (SET/KITS/TRAYS/PACK) ×3 IMPLANT
TUBING HIGH PRESSURE 120CM (CONNECTOR) ×3 IMPLANT
WIRE AMPLATZ SS-J .035X260CM (WIRE) ×3 IMPLANT
WIRE BENTSON .035X145CM (WIRE) ×6 IMPLANT
WIRE STIFF LUNDERQUIST 260CM (WIRE) ×3 IMPLANT

## 2014-06-11 NOTE — Op Note (Signed)
Patient name: [name] MRN: [id number] DOB: May 22, 1956 Sex: male  06/11/2014 Pre-operative Diagnosis: Descending thoracic aortic aneurysm Post-operative diagnosis:  Same Surgeon:  [name] Abrahams Assistants:  Gerri Lins Procedure:   #1: Endovascular repair of descending thoracic aortic aneurysm without left subclavian coverage   #2: Ultrasound-guided left common femoral artery access   #3: Distal extension 1   #4: Catheter in aorta 2.   #5: Aortic arch angiogram   #6: Abdominal aortic angiogram Anesthesia:  Gerri Lins Blood Loss:  See anesthesia record Specimens:  None  Findings:  Complete exclusion Devices used: Gore CTAG 40x20  X 2  Indications:  The patient has been followed for descending thoracic aortic aneurysm.  By my measurements it was now 5 cm.  I felt that he was ready for repair.  Procedure:  The patient was identified in the holding area and taken to [place] 16  The patient was then placed supine on the table. general anesthesia was administered.  The patient was prepped and draped in the usual sterile fashion.  A time out was called and antibiotics were administered.  Ultrasound was used to evaluate the left common femoral artery.  It was widely patent without significant calcification.  A #11 blade was used to make a skin nick.  Under ultrasound guidance, the left common femoral artery was cannulated with an 18-gauge needle.  An 035 wire was advanced without resistance.  The subtenon's tract was dilated with an 8 [place] dilator.  Provide devices were deployed at the 11:00 and 1:00 position for pre-closure.  Next a pigtail catheter was advanced into the descending aorta.  The Bentson wire was removed and a Lunderquist Super Stiff wire placed.  The patient was fully heparinized.  A 24 French Gore dry seal sheath was then advanced into the infrarenal abdominal aorta without significant resistance.  The main body was prepared on the back table.  This  was a Gore CTAG 40 x 20 device.  A second wire access through the dry seal sheath was obtained.  The pigtail catheter was advanced into the descending aorta.  The image detector was rotated to a 60 LAO position.  An aortic arch and Phillip Heal was performed locating the great vessels and the origin of the left subclavian artery.  The main body device was then put into position and successfully deployed landing at the level of the left subclavian artery.  The main body device was then removed.  The pigtail catheter was placed into the existing graft.  A second Gore 40 x 20 device was inserted.  This time the image detector was rotated to a 50 RAO projection.  It was brought down into the lower chest and upper abdomen.  An aortogram was performed which located the celiac artery origin.  The second 40 x 20 device was then inserted with appropriate overlap and deployed landing at the level of the celiac artery.  A completion study was then performed.  There was no evidence of a leak.  The device was in good position with preservation of blood flow through the left subclavian and the celiac artery.  Wires and catheters were removed.  The groin was closed by securing the previously placed row glide devices.  The skin was closed with 4-0 Vicryl.  The patient had Doppler signals in his feet.  He awoke neurologically intact moving both extremities.  There were no immediate complications.   Disposition:  To PACU in stable condition.  Theotis Burrow, M.D. [facility] Office: [phone] Pager:  [phone]

## 2014-06-11 NOTE — Anesthesia Preprocedure Evaluation (Addendum)
Anesthesia Evaluation  Patient identified by MRN, date of birth, ID band Patient awake    Reviewed: Allergy & Precautions, H&P , NPO status , Patient's Chart, lab work & pertinent test results  Airway Mallampati: II  TM Distance: >3 FB Neck ROM: Full    Dental  (+) Edentulous Lower, Edentulous Upper   Pulmonary Current Smoker,  breath sounds clear to auscultation        Cardiovascular hypertension, + Peripheral Vascular Disease Rhythm:Regular Rate:Normal     Neuro/Psych    GI/Hepatic negative GI ROS, Neg liver ROS,   Endo/Other  negative endocrine ROS  Renal/GU negative Renal ROS     Musculoskeletal   Abdominal   Peds  Hematology   Anesthesia Other Findings   Reproductive/Obstetrics                           Anesthesia Physical Anesthesia Plan  ASA: III  Anesthesia Plan: General   Post-op Pain Management:    Induction: Intravenous  Airway Management Planned: Oral ETT  Additional Equipment:   Intra-op Plan:   Post-operative Plan: Possible Post-op intubation/ventilation  Informed Consent: I have reviewed the patients History and Physical, chart, labs and discussed the procedure including the risks, benefits and alternatives for the proposed anesthesia with the patient or authorized representative who has indicated his/her understanding and acceptance.     Plan Discussed with: CRNA and Anesthesiologist  Anesthesia Plan Comments:         Anesthesia Quick Evaluation

## 2014-06-11 NOTE — Anesthesia Postprocedure Evaluation (Signed)
  Anesthesia Post-op Note  Patient: [name]  Procedure(s) Performed: Procedure(s): THORACIC AORTIC ENDOVASCULAR STENT GRAFT (N/A)  Patient Location: PACU  Anesthesia Type:General  Level of Consciousness: awake  Airway and Oxygen Therapy: Patient Spontanous Breathing  Post-op Pain: mild  Post-op Assessment: Post-op Vital signs reviewed  Post-op Vital Signs: Reviewed  Last Vitals:  Filed Vitals:   06/11/14 1051  BP: 169/76  Pulse:   Temp:   Resp:     Complications: No apparent anesthesia complications
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
## 2014-06-11 NOTE — Anesthesia Postprocedure Evaluation (Signed)
  Anesthesia Post-op Note   Patient: [name]  Procedure(s) Performed: Procedure(s): THORACIC AORTIC ENDOVASCULAR STENT GRAFT (N/A)  Patient Location: PACU  Anesthesia Type:General  Level of Consciousness: awake  Airway and Oxygen Therapy: Patient Spontanous Breathing  Post-op Pain: mild  Post-op Assessment: Post-op Vital signs reviewed  Post-op Vital Signs: Reviewed  Last Vitals:  Filed Vitals:   06/11/14 1051  BP: 169/76  Pulse:   Temp:   Resp:     Complications: No apparent anesthesia complications

## 2014-06-11 NOTE — Interval H&P Note (Signed)
History and Physical Interval Note:  06/11/2014 6:57 AM  [name]  has presented today for surgery, with the diagnosis of Descending Thoracic Aortic Aneurysm I71.2  The various methods of treatment have been discussed with the patient and family. After consideration of risks, benefits and other options for treatment, the patient has consented to  Procedure(s): THORACIC AORTIC ENDOVASCULAR STENT GRAFT (N/A) as a surgical intervention .  The patient's history has been reviewed, patient examined, no change in status, stable for surgery.  I have reviewed the patient's chart and labs.  Questions were answered to the patient's satisfaction.     Latreshia Beauchaine IV, V. WELLS

## 2014-06-11 NOTE — Progress Notes (Signed)
       The patient is alert Doppler DP/PT right and DP left monophasic Active range of motion of bilateral feet intact   Jamilee Lafosse MAUREEN PA-C

## 2014-06-11 NOTE — Transfer of Care (Signed)
Immediate Anesthesia Transfer of Care Note  Patient: [name]  Procedure(s) Performed: Procedure(s): THORACIC AORTIC ENDOVASCULAR STENT GRAFT (N/A)  Patient Location: PACU  Anesthesia Type:General  Level of Consciousness: awake, alert  and oriented  Airway & Oxygen Therapy: Patient Spontanous Breathing and Patient connected to nasal cannula oxygen  Post-op Assessment: Report given to PACU RN, Post -op Vital signs reviewed and stable and Patient moving all extremities X 4  Post vital signs: Reviewed and stable  Complications: No apparent anesthesia complications

## 2014-06-11 NOTE — Anesthesia Procedure Notes (Signed)
Procedure Name: Intubation Date/Time: 06/11/2014 7:48 AM Performed by: Gaylene Brooks Pre-anesthesia Checklist: Patient identified, Emergency Drugs available, Suction available, Patient being monitored and Timeout performed Patient Re-evaluated:Patient Re-evaluated prior to inductionOxygen Delivery Method: Circle system utilized Preoxygenation: Pre-oxygenation with 100% oxygen Intubation Type: IV induction Ventilation: Mask ventilation without difficulty Laryngoscope Size: Miller and 2 Grade View: Grade I Tube type: Oral Tube size: 7.5 mm Number of attempts: 1 Airway Equipment and Method: Stylet Placement Confirmation: ETT inserted through vocal cords under direct vision,  positive ETCO2,  CO2 detector and breath sounds checked- equal and bilateral Secured at: 22 cm Tube secured with: Tape Dental Injury: Teeth and Oropharynx as per pre-operative assessment

## 2014-06-12 LAB — BASIC METABOLIC PANEL
Anion gap: 11 (ref 5–15)
BUN: 9 mg/dL (ref 6–23)
CHLORIDE: 89 meq/L — AB (ref 96–112)
CO2: 28 meq/L (ref 19–32)
CREATININE: 0.5 mg/dL (ref 0.50–1.35)
Calcium: 8.4 mg/dL (ref 8.4–10.5)
GFR calc Af Amer: >90 mL/min (ref >90)
GFR calc non Af Amer: >90 mL/min (ref >90)
Glucose, Bld: 96 mg/dL (ref 70–99)
Potassium: 3.6 mEq/L — ABNORMAL LOW (ref 3.7–5.3)
Sodium: 128 mEq/L — ABNORMAL LOW (ref 137–147)

## 2014-06-12 LAB — TROPONIN I
Troponin I: <0.3 ng/mL (ref <0.30)
Troponin I: <0.3 ng/mL (ref <0.30)
Troponin I: <0.3 ng/mL (ref <0.30)

## 2014-06-12 LAB — CBC
HEMATOCRIT: 31.9 % — AB (ref 39.0–52.0)
Hemoglobin: 10.6 g/dL — ABNORMAL LOW (ref 13.0–17.0)
MCH: 29.3 pg (ref 26.0–34.0)
MCHC: 33.2 g/dL (ref 30.0–36.0)
MCV: 88.1 fL (ref 78.0–100.0)
Platelets: 163 10*3/uL (ref 150–400)
RBC: 3.62 MIL/uL — AB (ref 4.22–5.81)
RDW: 12.3 % (ref 11.5–15.5)
WBC: 9.6 10*3/uL (ref 4.0–10.5)

## 2014-06-12 MED ORDER — ENOXAPARIN SODIUM 40 MG/0.4ML ~~LOC~~ SOLN
40.0000 mg | SUBCUTANEOUS | Status: DC
  Administered 2014-06-13: 40 mg via SUBCUTANEOUS
  Filled 2014-06-12 (×2): qty 0.4

## 2014-06-12 MED ORDER — ASPIRIN 81 MG PO CHEW
81.0000 mg | CHEWABLE_TABLET | Freq: Every day | ORAL | Status: DC
  Administered 2014-06-12 (×2): 81 mg via ORAL
  Filled 2014-06-12 (×2): qty 1

## 2014-06-12 MED ORDER — NITROGLYCERIN 0.4 MG SL SUBL: 0.4000 mg | SUBLINGUAL_TABLET | SUBLINGUAL | Status: DC | PRN

## 2014-06-12 NOTE — Progress Notes (Signed)
Progress Note    06/12/2014 7:14 AM 1 Day Post-Op  Subjective:  C/o right heel hurting; had CP overnight--still has minimal CP, but improved with pain medication  Tm 99 now afebrile HR 60's-70's NSR 696'V-893'Y systolic (cuff) 101'B-510'C systolic (A-line) 58% RA  Gtts:  none  Filed Vitals:   06/12/14 0600  BP:   Pulse: 76  Temp:   Resp: 11    Physical Exam: Cardiac:  regular Lungs:  Non labored Incisions:  Left groin soft without hematoma Extremities:  Biphasic doppler signal right PT/DP and left DP; palpable left radial pulse Abdomen:  Soft, NT/ND  CBC    Component Value Date/Time   WBC 9.6 06/12/2014 0605   RBC 3.62* 06/12/2014 0605   RBC 2.75* 10/15/2008 0456   HGB 10.6* 06/12/2014 0605   HCT 31.9* 06/12/2014 0605   PLT 163 06/12/2014 0605   MCV 88.1 06/12/2014 0605   MCH 29.3 06/12/2014 0605   MCHC 33.2 06/12/2014 0605   RDW 12.3 06/12/2014 0605   LYMPHSABS 1.6 08/05/2012 0903   MONOABS 0.8 08/05/2012 0903   EOSABS 0.1 08/05/2012 0903   BASOSABS 0.0 08/05/2012 0903    BMET    Component Value Date/Time   NA 131* 06/11/2014 1000   K 3.3* 06/11/2014 1000   CL 91* 06/11/2014 1000   CO2 28 06/11/2014 1000   GLUCOSE 88 06/11/2014 1000   BUN 11 06/11/2014 1000   CREATININE 0.57 06/11/2014 1000   CREATININE 0.71 09/12/2012 1426   CALCIUM 8.6 06/11/2014 1000   GFRNONAA >90 06/11/2014 1000   GFRNONAA >89 10/23/2011 1504   GFRAA >90 06/11/2014 1000   GFRAA >89 10/23/2011 1504    INR    Component Value Date/Time   INR 1.32 06/11/2014 1000    Troponins negative thus far x 2   Intake/Output Summary (Last 24 hours) at 06/12/14 5277 Last data filed at 06/12/14 0600  Gross per 24 hour  Intake   2340 ml  Output    655 ml  Net   1685 ml      Assessment:  58 y.o. male is s/p:  #1: Endovascular repair of descending thoracic aortic aneurysm without left subclavian coverage #2: Ultrasound-guided left common femoral  artery access #3: Distal extension 1 #4: Catheter in aorta 2. #5: Aortic arch angiogram #6: Abdominal aortic angiogram  1 Day Post-Op  Plan: -CP overnight with normal sinus rhythm on EKG and negative troponin's x 2 thus far.  Only minimal CP this am (improved with pain medication) -hyponatremia-stable and asymptomatic -c/o right heel pain-there are no sores present on his heel.  He has not been out of bed in 24 hours.  Will float heels. -pt with foley-d/c if no issues with insertion -increase mobilization this am -DVT prophylaxis:  Lovenox   [name], PA-C [facility] [phone] 06/12/2014 7:14 AM    I agree with the above. C/o chest pain overnight: EKG was normal.  Cardiac enzymes have remained normal.  His pain does improve with narcotics.  I discussed with the patient that should his pain persist, I would get a CT angiogram of the chest to rule out any potential issues with his stent that was placed yesterday.  He'll be monitored throughout the day.  Complaining of right heel pain overnight:  The patient has good Doppler signals in his right foot which is baseline.  He has normal motor and sensory function.  I suspect this is just secondary to positioning on the table, but this  will continue to be monitored.  DC Foley today  Hyponatremia:  .  IV fluids and try mild fluid restriction today with repeat labs in the morning.   [name]

## 2014-06-12 NOTE — Progress Notes (Signed)
Nursing Pt c/o midsternal chest pain.  States feels like a weight on his chest.  EKG done, no changes, normal EKG.  Dr. Trula Slade paged.  Orders received.

## 2014-06-13 LAB — BASIC METABOLIC PANEL
Anion gap: 11 (ref 5–15)
BUN: 9 mg/dL (ref 6–23)
CO2: 31 meq/L (ref 19–32)
Calcium: 9 mg/dL (ref 8.4–10.5)
Chloride: 91 mEq/L — ABNORMAL LOW (ref 96–112)
Creatinine, Ser: 0.52 mg/dL (ref 0.50–1.35)
GFR calc Af Amer: >90 mL/min (ref >90)
GLUCOSE: 92 mg/dL (ref 70–99)
POTASSIUM: 3.5 meq/L — AB (ref 3.7–5.3)
Sodium: 133 mEq/L — ABNORMAL LOW (ref 137–147)

## 2014-06-13 NOTE — Discharge Summary (Signed)
[facility] Discharge Summary   THUNDER BRIDGEWATER 14-Feb-1956 58 y.o. 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 male patient is back for further discussions of his descending thoracic aortic aneurysm. By imaging review he is a candidate for endovascular repair. He has had no interval change. He continues to have difficulty with his memory. He has a history of ischemic cardiomyopathy, his last echo was in 2013 which showed an ejection fraction of 50-55 percent. He denies symptoms of claudication. He has not had any neurologic events.  Hospital Course:  The patient was admitted to the hospital and taken to the operating room on 06/11/2014 and underwent: #1: Endovascular repair of descending thoracic aortic aneurysm without left subclavian coverage #2: Ultrasound-guided left common femoral artery access #3: Distal extension 1 #4: Catheter in aorta 2. #5: Aortic arch angiogram #6: Abdominal aortic angiogram    The pt tolerated the procedure well and was transported to the PACU in good condition.   By the evening of POD 1, he was having some CP.  EKG obtained was NSR and serial cardiac enzymes were negative.  This pain did improve with pain medication and subsequently completely resolved.    He had also c/o right heel pain.  The patient has good Doppler signals in his right foot which is baseline. He has normal motor and sensory function. I suspect this is just secondary to positioning on the table, but this will continue to be monitored.  This also completely resolved.  The remainder of the hospital course consisted of increasing mobilization and increasing intake of solids without difficulty.  CBC      Component Value Date/Time   WBC 9.6 06/12/2014 0605   RBC 3.62* 06/12/2014 0605   RBC 2.75* 10/15/2008 0456   HGB 10.6* 06/12/2014 0605   HCT 31.9* 06/12/2014 0605   PLT 163 06/12/2014 0605   MCV 88.1 06/12/2014 0605   MCH 29.3 06/12/2014 0605   MCHC 33.2 06/12/2014 0605   RDW 12.3 06/12/2014 0605   LYMPHSABS 1.6 08/05/2012 0903   MONOABS 0.8 08/05/2012 0903   EOSABS 0.1 08/05/2012 0903   BASOSABS 0.0 08/05/2012 0903    BMET    Component Value Date/Time   NA 133* 06/13/2014 0236   K 3.5* 06/13/2014 0236   CL 91* 06/13/2014 0236   CO2 31 06/13/2014 0236   GLUCOSE 92 06/13/2014 0236   BUN 9 06/13/2014 0236   CREATININE 0.52 06/13/2014 0236   CREATININE 0.71 09/12/2012 1426   CALCIUM 9.0 06/13/2014 0236   GFRNONAA >90 06/13/2014 0236   GFRNONAA >89 10/23/2011 1504   GFRAA >90 06/13/2014 0236   GFRAA >89 10/23/2011 1504     Discharge Instructions:   The patient is discharged with extensive instructions on wound care and progressive ambulation.  They are instructed not to drive or perform any heavy lifting until returning to see the physician in his office.  Discharge Instructions    Call MD for:  redness, tenderness, or signs of infection (pain, swelling, bleeding, redness, odor or green/yellow discharge around incision site)    Complete by:  As directed      Call MD for:  severe or increased pain, loss or decreased feeling  in affected limb(s)    Complete by:  As directed      Call MD for:  temperature >100.5  Complete by:  As directed      Discharge instructions    Complete by:  As directed   You may shower with soap and water in 24 hours     Driving Restrictions    Complete by:  As directed   No driving for 2 weeks     Increase activity slowly    Complete by:  As directed   Walk with assistance use walker or cane as needed     Lifting restrictions    Complete by:  As directed   No lifting for 6 weeks     Resume previous diet    Complete by:  As directed             Discharge Diagnosis:  Descending Thoracic Aortic Aneurysm 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 Thoracic aortic aneurysm without rupture 06/11/2014  . Aneurysm of thoracic aorta 05/10/2014  . AAA (abdominal aortic aneurysm) without rupture 10/21/2012  . Thoracic aneurysm without mention of rupture 10/21/2012  . Hyperlipidemia 10/18/2012  . Poor short term memory 10/18/2012  . Depression 10/16/2012  . Dry skin 10/16/2012  . Thoracic aortic aneurysm 09/12/2012  . Urge urinary incontinence 08/26/2012  . Lumbosacral radiculopathy at L4 07/30/2012  . Hyperextension of knee or lower leg 05/08/2012  . Shoulder pain, right 02/16/2012  . Healthcare maintenance 02/16/2012  . Cardiomyopathy, nonischemic 02/14/2012  . Short-term memory loss 02/14/2012  . Hyponatremia 10/18/2011  . Seizures 10/18/2011  . Hypertension 10/18/2011  . TBI (traumatic brain injury) 10/18/2011  . H/O alcohol abuse 10/18/2011  . Tobacco abuse 10/18/2011   Past Medical History  Diagnosis Date  . Seizures     last seizure several years ago  . Hypertension   . Memory deficits   . Blood transfusion   . TBI (traumatic brain injury)     Sept 1988  . Cardiomegaly     EF 25-30 % in 2005, EF 50-55% Oct 2013  . Alcohol abuse     h/o, quit april 5th, 2013  . Aortic aneurysm   . Radiculopathy   . Lumbar herniated disc   . Hyperlipidemia   . Urinary urgency        Medication List    TAKE these medications        acetaminophen 500 MG tablet  Commonly known as:  TYLENOL  Take 1,000 mg by mouth daily.     atenolol-chlorthalidone 50-25 MG per tablet  Commonly known as:  TENORETIC  Take 1 tablet by mouth daily.     feeding supplement (ENSURE COMPLETE) Liqd  Take 237 mLs by mouth daily.     multivitamin with minerals Tabs tablet  Take 1 tablet by mouth daily.     oxybutynin 5 MG tablet  Commonly known as:  DITROPAN  Take 1 tablet (5 mg total) by  mouth daily.     oxyCODONE-acetaminophen 5-325 MG per tablet  Commonly known as:  PERCOCET/ROXICET  Take 1 tablet by mouth every 6 (six) hours as needed.     phenytoin 100 MG ER capsule  Commonly known as:  DILANTIN  Take 200 mg by mouth at bedtime.     phenytoin 30 MG ER capsule  Commonly known as:  DILANTIN  Take 60 mg by mouth at bedtime.     pravastatin 40 MG tablet  Commonly known as:  PRAVACHOL  Take 1 tablet (40 mg total) by mouth every evening. For high cholesterol  Urea 39 % Crea  Commonly known as:  ALUVEA  Apply 1 application topically 2 (two) times daily. To dry skin     Vitamin D-3 1000 UNITS Caps  Take 1 capsule by mouth daily.        Percocet #30 No Refill  Disposition: assisted living  Patient's condition: is Good  Follow up: 1. Dr. Trula Slade in 4 weeks with CTA   [name], PA-C [facility] [phone] 06/13/2014  5:52 AM   - For VQI Registry use --- Instructions: Press F2 to tab through selections.  Delete question if not applicable.   Post-op:  Time to Extubation: [X]  In OR, [ ]  < 12 hrs, [ ]  12-24 hrs, [ ]  >=24 hrs Vasopressors Req. Post-op: No MI: No., [ ]  Troponin only, [ ]  EKG or Clinical New Arrhythmia: No CHF: No ICU Stay: 2 days in stepdown Transfusion: No  If yes, n/a units given  Complications: Resp failure: No., [ ]  Pneumonia, [ ]  Ventilator Chg in renal function: No., [ ]  Inc. Cr > 0.5, [ ]  Temp. Dialysis, [ ]  Permanent dialysis Leg ischemia: No., no Surgery needed, [ ]  Yes, Surgery needed, [ ]  Amputation Bowel ischemia: No., [ ]  Medical Rx, [ ]  Surgical Rx Wound complication: No., [ ]  Superficial separation/infection, [ ]  Return to OR Return to OR: No  Return to OR for bleeding: No Stroke: No., [ ]  Minor, [ ]  Major  Discharge medications: Statin use:  Yes If No: [ ]  For Medical reasons, [ ]  Non-compliant, [ ]  Not-indicated ASA use:  No  If No: [ ]  For Medical reasons, [ ]  Non-compliant, [ ]   Not-indicated Plavix use:  No If No: [ ]  For Medical reasons, [ ]  Non-compliant, [ ]  Not-indicated Beta blocker use:  Yes If No: [ ]  For Medical reasons, [ ]  Non-compliant, [ ]  Not-indicated

## 2014-06-13 NOTE — Progress Notes (Signed)
Discharge instructions reviewed with patient and sister all questions answered at this time.  Prescriptions given to patients sister.  Pt. VSS with no s/s of distress noted.  Patient stable at discharge.

## 2014-06-13 NOTE — Progress Notes (Signed)
Utilization Review Completed.[name] T12/20/2015  

## 2014-06-13 NOTE — Progress Notes (Signed)
  Progress Note    06/13/2014 5:47 AM 2 Days Post-Op  Subjective:  CP improved; states he has walked; right heel pain resolved.  Afebrile VSS 100% RA  Filed Vitals:   06/13/14 0352  BP: 127/62  Pulse: 70  Temp: 98.1 F (36.7 C)  Resp: 15    Physical Exam: Cardiac:  regular Lungs:  Non labored Incisions:  Left groin without hematoma; slight tenderness to palpation Abdomen:  Soft, NT/ND  CBC    Component Value Date/Time   WBC 9.6 06/12/2014 0605   RBC 3.62* 06/12/2014 0605   RBC 2.75* 10/15/2008 0456   HGB 10.6* 06/12/2014 0605   HCT 31.9* 06/12/2014 0605   PLT 163 06/12/2014 0605   MCV 88.1 06/12/2014 0605   MCH 29.3 06/12/2014 0605   MCHC 33.2 06/12/2014 0605   RDW 12.3 06/12/2014 0605   LYMPHSABS 1.6 08/05/2012 0903   MONOABS 0.8 08/05/2012 0903   EOSABS 0.1 08/05/2012 0903   BASOSABS 0.0 08/05/2012 0903    BMET    Component Value Date/Time   NA 133* 06/13/2014 0236   K 3.5* 06/13/2014 0236   CL 91* 06/13/2014 0236   CO2 31 06/13/2014 0236   GLUCOSE 92 06/13/2014 0236   BUN 9 06/13/2014 0236   CREATININE 0.52 06/13/2014 0236   CREATININE 0.71 09/12/2012 1426   CALCIUM 9.0 06/13/2014 0236   GFRNONAA >90 06/13/2014 0236   GFRNONAA >89 10/23/2011 1504   GFRAA >90 06/13/2014 0236   GFRAA >89 10/23/2011 1504    INR    Component Value Date/Time   INR 1.32 06/11/2014 1000     Intake/Output Summary (Last 24 hours) at 06/13/14 0547 Last data filed at 06/13/14 0200  Gross per 24 hour  Intake   1320 ml  Output   1350 ml  Net    -30 ml     Assessment:  58 y.o. male is s/p:  #1: Endovascular repair of descending thoracic aortic aneurysm without left subclavian coverage #2: Ultrasound-guided left common femoral artery access #3: Distal extension 1 #4: Catheter in aorta 2. #5: Aortic arch angiogram #6: Abdominal aortic  angiogram   2 Days Post-Op  Plan: -CP from previous night has completely resolved.  His 3rd troponin was also negative.   -hyponatremia has improved this am -right heel pain resolved -pt has mobilized and voided -DVT prophylaxis:  Lovenox -will d/c home today and f/u with Dr. Trula Slade in 4 weeks with CTA protocol   Chris Locket, PA-C [facility] [phone] 06/13/2014 5:47 AM    I agree with the above.  No longer c/o chest pain or right heel pain.   Left groin access site OK  WIll plan for d/c home today    [name]

## 2014-06-15 ENCOUNTER — Telehealth: Payer: Self-pay | Admitting: Surgery

## 2014-06-15 ENCOUNTER — Encounter (HOSPITAL_COMMUNITY): Payer: Self-pay | Admitting: Surgery

## 2014-06-15 NOTE — Telephone Encounter (Addendum)
-----   Message from Mena Goes, RN sent at 06/11/2014 10:56 AM EST ----- Regarding: Schedule   ----- Message -----    From: Ulyses Amor, PA-C    Sent: 06/11/2014   9:49 AM      To: [facility] Charge Pool  F/U with Dr. Trula Slade in 4 weeks CTA chest and abdomin s/p TVAR  06/15/14: lm for patients sister, dpm

## 2014-07-09 ENCOUNTER — Encounter: Payer: Self-pay | Admitting: Surgery

## 2014-07-12 ENCOUNTER — Ambulatory Visit
Admission: RE | Admit: 2014-07-12 | Discharge: 2014-07-12 | Disposition: A | Discharge status: Home / Self Care | Payer: Medicaid Other | Source: Ambulatory Visit | Attending: Surgery | Admitting: Surgery

## 2014-07-12 ENCOUNTER — Encounter: Payer: Self-pay | Admitting: Surgery

## 2014-07-12 ENCOUNTER — Ambulatory Visit (INDEPENDENT_AMBULATORY_CARE_PROVIDER_SITE_OTHER): Payer: Self-pay | Admitting: Surgery

## 2014-07-12 VITALS — BP 121/66 | HR 60 | Ht 66.0 in | Wt 104.1 lb

## 2014-07-12 DIAGNOSIS — I716 Thoracoabdominal aortic aneurysm, without rupture, unspecified: Secondary | ICD-10-CM

## 2014-07-12 DIAGNOSIS — Z95828 Presence of other vascular implants and grafts: Secondary | ICD-10-CM

## 2014-07-12 DIAGNOSIS — I712 Thoracic aortic aneurysm, without rupture: Secondary | ICD-10-CM

## 2014-07-12 DIAGNOSIS — I7123 Aneurysm of the descending thoracic aorta, without rupture: Secondary | ICD-10-CM

## 2014-07-12 MED ORDER — IOHEXOL 350 MG/ML SOLN
165.0000 mL | Freq: Once | INTRAVENOUS | Status: AC | PRN
  Administered 2014-07-12: 165 mL via INTRAVENOUS

## 2014-07-12 NOTE — Addendum Note (Signed)
Addended by: Mena Goes on: 07/12/2014 11:38 AM   Modules accepted: Orders

## 2014-07-12 NOTE — Progress Notes (Signed)
The patient is back for follow-up.  On 06/11/2014 he underwent endovascular repair of a descending thoracic aortic aneurysm.  This was done through a left common femoral artery access.  His postoperative course was uncomplicated.  He has no complaints today.  On examination his extremities are warm and well perfused.  The left groin cannulation site is without complication.  I have reviewed his CT scan with radiology.  The stent graft is in good position with successful exclusion of the aneurysm.  The patient will follow-up with me in 6 months with a repeat CT angiogram

## 2014-07-19 ENCOUNTER — Encounter: Payer: Self-pay | Admitting: Surgery

## 2014-12-16 ENCOUNTER — Other Ambulatory Visit: Payer: Self-pay | Admitting: Surgery

## 2014-12-31 ENCOUNTER — Other Ambulatory Visit: Payer: Self-pay | Admitting: Surgery

## 2014-12-31 LAB — BUN: BUN: 16 mg/dL (ref 6–23)

## 2014-12-31 LAB — CREATININE, SERUM: CREATININE: 0.73 mg/dL (ref 0.50–1.35)

## 2015-01-03 ENCOUNTER — Ambulatory Visit
Admission: RE | Admit: 2015-01-03 | Discharge: 2015-01-03 | Disposition: A | Discharge status: Home / Self Care | Payer: Medicaid Other | Source: Ambulatory Visit | Attending: Surgery | Admitting: Surgery

## 2015-01-03 DIAGNOSIS — I712 Thoracic aortic aneurysm, without rupture: Secondary | ICD-10-CM

## 2015-01-03 DIAGNOSIS — I7123 Aneurysm of the descending thoracic aorta, without rupture: Secondary | ICD-10-CM

## 2015-01-03 DIAGNOSIS — Z95828 Presence of other vascular implants and grafts: Secondary | ICD-10-CM

## 2015-01-03 MED ORDER — IOPAMIDOL (ISOVUE-370) INJECTION 76%
75.0000 mL | Freq: Once | INTRAVENOUS | Status: AC | PRN
  Administered 2015-01-03: 75 mL via INTRAVENOUS

## 2015-01-06 ENCOUNTER — Encounter: Payer: Self-pay | Admitting: Surgery

## 2015-01-10 ENCOUNTER — Ambulatory Visit: Payer: Self-pay | Admitting: Surgery

## 2015-01-10 ENCOUNTER — Ambulatory Visit (INDEPENDENT_AMBULATORY_CARE_PROVIDER_SITE_OTHER): Payer: Medicaid Other | Admitting: Surgery

## 2015-01-10 ENCOUNTER — Encounter: Payer: Self-pay | Admitting: Surgery

## 2015-01-10 VITALS — BP 120/76 | HR 70 | Temp 98.0°F | Resp 14 | Ht 66.0 in | Wt 105.0 lb

## 2015-01-10 DIAGNOSIS — I712 Thoracic aortic aneurysm, without rupture, unspecified: Secondary | ICD-10-CM

## 2015-01-10 DIAGNOSIS — Z48812 Encounter for surgical aftercare following surgery on the circulatory system: Secondary | ICD-10-CM | POA: Diagnosis not present

## 2015-01-10 NOTE — Progress Notes (Signed)
History of Present Illness  [name] is a 59 y.o. (Jun 16, 1956) male who presents for routine follow up s/p TEVAR (Date: 06/12/15).  His prior CTA (Date: 07/12/14) demonstrated good stent graft position without progression of aneurysm dilatation. The patient denies any chest pain, abdominal pain or back pain. The patient has been doing well.  He is currently living in an assisted living facility  The patient's PMH, PSH, SH, and FamHx are unchanged from 07/12/14 except for addition of lorazepam for irritation and reduction in blood pressure medication.   Current Outpatient Prescriptions  Medication Sig Dispense Refill  . acetaminophen (TYLENOL) 500 MG tablet Take 1,000 mg by mouth daily.    Marland Kitchen atenolol-chlorthalidone (TENORETIC) 50-25 MG per tablet Take 1 tablet by mouth daily. (Patient taking differently: Take 1 tablet by mouth daily. 1/2 tablet daily per sister) 30 tablet 5  . Cholecalciferol (VITAMIN D-3) 1000 UNITS CAPS Take 1 capsule by mouth daily.    . feeding supplement, ENSURE COMPLETE, (ENSURE COMPLETE) LIQD Take 237 mLs by mouth 3 (three) times daily between meals.     Marland Kitchen LORazepam (ATIVAN) 0.5 MG tablet Take 0.5 mg by mouth every 8 (eight) hours as needed for anxiety.    . Multiple Vitamin (MULITIVITAMIN WITH MINERALS) TABS Take 1 tablet by mouth daily.    . phenytoin (DILANTIN) 100 MG ER capsule Take 200 mg by mouth at bedtime.     . phenytoin (DILANTIN) 30 MG ER capsule Take 60 mg by mouth at bedtime.     Marland Kitchen oxyCODONE-acetaminophen (PERCOCET/ROXICET) 5-325 MG per tablet Take 1 tablet by mouth every 6 (six) hours as needed. (Patient not taking: Reported on 07/12/2014) 30 tablet 0  . pravastatin (PRAVACHOL) 40 MG tablet Take 1 tablet (40 mg total) by mouth every evening. For high cholesterol 30 tablet 11  . [DISCONTINUED] topiramate (TOPAMAX) 25 MG tablet Take 25 mg by mouth 2 (two) times daily.    . [DISCONTINUED] triamterene-hydrochlorothiazide (MAXZIDE-25) 37.5-25 MG per tablet  Take 0.5 tablets by mouth daily.     No current facility-administered medications for this visit.    Allergies  Allergen Reactions  . Isovue [Iopamidol] Hives    Pt broke out in one hive on his chest after contrast on 01/03/15.  Pt will need full premeds in the future per Dr Martinique.      Physical Examination  Filed Vitals:   01/10/15 1548  BP: 120/76  Pulse: 70  Temp: 98 F (36.7 C)  TempSrc: Oral  Resp: 14  Height: 5\' 6"  (1.676 m)  Weight: 105 lb (47.628 kg)  SpO2: 99%   Body mass index is 16.96 kg/(m^2).  General: A&O x 3, WD thin male in NAD  Pulmonary: clear to auscultation b/l  Cardiac: RRR, Nl S1, S2, no Murmurs, rubs or gallops  Vascular: palpable radial pulses bilaterally. Faintly palpable dorsalis pedis pulses bilaterally. Feet warm and well perfused.   Musculoskeletal: Extremities without ischemic changes  Neurologic: No focal deficits    Medical Decision Making  [name] is a 59 y.o. male who presents s/p TEVAR 06/11/14. The patient is doing well. His CTA today shows his stent graft to be in good position with stable maximal aneurysm diameter of 4 cm (pre-op 4.9 cm).  He will follow up in one year with repeat CTA chest. He reports a reaction to receiving contrast. He will need to be premedicated prior to his study.    Virgina Jock, PA-C [facility] [employer] Office:  [phone]  01/10/2015, 4:27 PM  This patient was seen and examined in conjunction with Dr. Trula Slade.     I agree with the above.  The patient is here for follow up. On 06/11/2014 he underwent endovascular repair of a descending thoracic aortic aneurysm. This was done through a left common femoral artery access. His postoperative course was uncomplicated. I have reviewed his CTA today.  His stent is in good position.  There is no endoleak.  Maximum diameter of the aneurysm is 4cm, down from 4.9.  He was repaired at this size because of his small stature  and concern for rupture.  He will follow up in 1 year with a repeat CTA of the chest.  [name]

## 2016-01-09 ENCOUNTER — Ambulatory Visit
Admission: RE | Admit: 2016-01-09 | Discharge: 2016-01-09 | Disposition: A | Discharge status: Home / Self Care | Payer: Medicaid Other | Source: Ambulatory Visit | Attending: Surgery | Admitting: Surgery

## 2016-01-09 DIAGNOSIS — I712 Thoracic aortic aneurysm, without rupture, unspecified: Secondary | ICD-10-CM

## 2016-01-09 DIAGNOSIS — Z48812 Encounter for surgical aftercare following surgery on the circulatory system: Secondary | ICD-10-CM

## 2016-01-09 MED ORDER — IOPAMIDOL (ISOVUE-370) INJECTION 76%
75.0000 mL | Freq: Once | INTRAVENOUS | Status: AC | PRN
  Administered 2016-01-09: 75 mL via INTRAVENOUS

## 2016-01-12 ENCOUNTER — Encounter: Payer: Self-pay | Admitting: Surgery

## 2016-01-16 ENCOUNTER — Ambulatory Visit: Payer: Self-pay | Admitting: Surgery

## 2016-01-16 ENCOUNTER — Encounter: Payer: Self-pay | Admitting: Cardiology

## 2016-01-23 ENCOUNTER — Ambulatory Visit: Payer: Self-pay | Admitting: Surgery

## 2016-01-23 ENCOUNTER — Encounter: Payer: Self-pay | Admitting: Surgery

## 2016-01-23 ENCOUNTER — Ambulatory Visit (INDEPENDENT_AMBULATORY_CARE_PROVIDER_SITE_OTHER): Payer: Medicaid Other | Admitting: Surgery

## 2016-01-23 VITALS — BP 102/68 | HR 66 | Temp 98.0°F | Resp 16 | Ht 66.0 in | Wt 114.0 lb

## 2016-01-23 DIAGNOSIS — I712 Thoracic aortic aneurysm, without rupture, unspecified: Secondary | ICD-10-CM

## 2016-01-23 NOTE — Progress Notes (Signed)
V[facility] and [facility]in Specialist of [place]  Patient name: [name] MRN: [id number] DOB: 1956-05-15 Sex: male  REASON FOR VISIT: follow up  HPI: [name] is a 60 y.o. male who is back for follow-up.  He is status post endovascular repair of a descending thoracic aortic aneurysm on 06/12/2015.  Maximum aortic diameter was 4.8 cm.  He denies any abdominal pain or back pain.  He denies chest pain.  He continues to live in a assisted living facility.  Past Medical History:  Diagnosis Date  . Alcohol abuse    h/o, quit april 5th, 2013  . Aortic aneurysm ([place])   . Blood transfusion   . Cardiomegaly    EF 25-30 % in 2005, EF 50-55% Oct 2013  . Hyperlipidemia   . Hypertension   . Lumbar herniated disc   . Memory deficits   . Radiculopathy   . Seizures ([place])    last seizure several years ago  . TBI (traumatic brain injury) [facility])    Sept 1988  . Urinary urgency     Family History  Problem Relation Age of Onset  . Breast cancer Maternal Aunt   . Esophageal cancer Cousin   . Colon cancer Neg Hx   . Stomach cancer Neg Hx   . Esophageal cancer Maternal Uncle   . Liver disease Mother   . Hypertension Mother     SOCIAL HISTORY: Social History  Substance Use Topics  . Smoking status: Current Some Day Smoker    Packs/day: 0.10    Types: Cigarettes  . Smokeless tobacco: Never Used     Comment: 1 cigarette per day.   . Alcohol use No     Comment: per sister, last drink was apr 5th- hx of ETOH    Allergies  Allergen Reactions  . Isovue [Iopamidol] Hives    Pt broke out in one hive on his chest after contrast on 01/03/15.  Pt will need full premeds in the future per Dr Martinique.      Current Outpatient Prescriptions  Medication Sig Dispense Refill  . acetaminophen (TYLENOL) 500 MG tablet Take 1,000 mg by mouth daily.    Marland Kitchen atenolol-chlorthalidone (TENORETIC) 50-25 MG per tablet Take 1 tablet by mouth daily. (Patient taking  differently: Take 1 tablet by mouth daily. 1/2 tablet daily per sister) 30 tablet 5  . Cholecalciferol (VITAMIN D-3) 1000 UNITS CAPS Take 1 capsule by mouth daily.    . feeding supplement, ENSURE COMPLETE, (ENSURE COMPLETE) LIQD Take 237 mLs by mouth 3 (three) times daily between meals.     . mirtazapine (REMERON) 15 MG tablet Take 15 mg by mouth at bedtime.    . Multiple Vitamin (MULITIVITAMIN WITH MINERALS) TABS Take 1 tablet by mouth daily.    . phenytoin (DILANTIN) 100 MG ER capsule Take 200 mg by mouth at bedtime.     . phenytoin (DILANTIN) 30 MG ER capsule Take 60 mg by mouth at bedtime.     Marland Kitchen LORazepam (ATIVAN) 0.5 MG tablet Take 0.5 mg by mouth every 8 (eight) hours as needed for anxiety.    Marland Kitchen oxyCODONE-acetaminophen (PERCOCET/ROXICET) 5-325 MG per tablet Take 1 tablet by mouth every 6 (six) hours as needed. (Patient not taking: Reported on 01/23/2016) 30 tablet 0  . pravastatin (PRAVACHOL) 40 MG tablet Take 1 tablet (40 mg total) by mouth every evening. For high cholesterol 30 tablet 11   No current facility-administered medications for this visit.     REVIEW OF SYSTEMS:  [  X] denotes positive finding, [ ]  denotes negative finding Cardiac  Comments:  Chest pain or chest pressure:    Shortness of breath upon exertion:    Short of breath when lying flat:    Irregular heart rhythm:        Vascular    Pain in calf, thigh, or hip brought on by ambulation:    Pain in feet at night that wakes you up from your sleep:     Blood clot in your veins:    Leg swelling:         Pulmonary    Oxygen at home:    Productive cough:     Wheezing:         Neurologic    Sudden weakness in arms or legs:     Sudden numbness in arms or legs:     Sudden onset of difficulty speaking or slurred speech:    Temporary loss of vision in one eye:     Problems with dizziness:         Gastrointestinal    Blood in stool:     Vomited blood:         Genitourinary    Burning when urinating:     Blood in  urine:        Psychiatric    Major depression:         Hematologic    Bleeding problems:    Problems with blood clotting too easily:        Skin    Rashes or ulcers:        Constitutional    Fever or chills:      PHYSICAL EXAM: Vitals:   01/23/16 1026  BP: 102/68  Pulse: 66  Resp: 16  Temp: 98 F (36.7 C)  TempSrc: Oral  SpO2: 96%  Weight: 114 lb (51.7 kg)  Height: 5\' 6"  (1.676 m)    GENERAL: The patient is a well-nourished male, in no acute distress. The vital signs are documented above. CARDIAC: There is a regular rate and rhythm.  VASCULAR: Pedal pulses are not palpable.  No carotid bruit. PULMONARY: There is good air exchange bilaterally without wheezing or rales. MUSCULOSKELETAL: There are no major deformities or cyanosis. NEUROLOGIC: No focal weakness or paresthesias are detected. SKIN: There are no ulcers or rashes noted. PSYCHIATRIC: The patient has a normal affect.  DATA:  I have reviewed his CT scan.  Maximum aortic diameter remained stable at 4.8 cm.  There has been no aneurysmal growth.  There is no evidence of endoleak  MEDICAL ISSUES: Descending thoracic aortic aneurysm: The patient's aneurysm remains stable by CT scan.  There are no complicating features.  The stent graft is in good position.  I have recommended surveillance CT angiogram in 2 years.    [name], MD [facility] [phone] Pager [phone]

## 2016-01-25 ENCOUNTER — Encounter: Payer: Self-pay | Admitting: Cardiology

## 2017-01-25 ENCOUNTER — Encounter: Payer: Self-pay | Admitting: *Deleted

## 2017-04-29 ENCOUNTER — Encounter: Payer: Self-pay | Admitting: Internal Medicine

## 2017-05-21 ENCOUNTER — Encounter: Payer: Self-pay | Admitting: Internal Medicine

## 2017-07-12 ENCOUNTER — Ambulatory Visit (AMBULATORY_SURGERY_CENTER): Payer: Self-pay | Admitting: *Deleted

## 2017-07-12 ENCOUNTER — Other Ambulatory Visit: Payer: Self-pay

## 2017-07-12 VITALS — Ht 69.0 in | Wt 119.0 lb

## 2017-07-12 DIAGNOSIS — Z86010 Personal history of colonic polyps: Secondary | ICD-10-CM

## 2017-07-12 MED ORDER — NA SULFATE-K SULFATE-MG SULF 17.5-3.13-1.6 GM/177ML PO SOLN: 1.0000 | Freq: Once | ORAL | 0 refills | Status: AC

## 2017-07-12 NOTE — Progress Notes (Signed)
No egg or soy allergy known to patient  No issues with past sedation with any surgeries  or procedures, no intubation problems  No diet pills per patient No home 02 use per patient  No blood thinners per patient  Pt denies issues with constipation  No A fib or A flutter  EMMI video sent to pt's e mail - pt declined  Sister [name] with pt in PV today - pt lives at [facility] [place] [facility] the assisted living home

## 2017-07-26 ENCOUNTER — Encounter: Payer: Self-pay | Admitting: Internal Medicine

## 2017-07-26 ENCOUNTER — Ambulatory Visit (AMBULATORY_SURGERY_CENTER): Payer: Medicaid Other | Admitting: Internal Medicine

## 2017-07-26 VITALS — BP 133/71 | HR 74 | Temp 97.5°F | Resp 12 | Ht 69.0 in | Wt 119.0 lb

## 2017-07-26 DIAGNOSIS — D124 Benign neoplasm of descending colon: Secondary | ICD-10-CM | POA: Diagnosis not present

## 2017-07-26 DIAGNOSIS — Z86010 Personal history of colonic polyps: Secondary | ICD-10-CM

## 2017-07-26 MED ORDER — SODIUM CHLORIDE 0.9 % IV SOLN: 500.0000 mL | Freq: Once | INTRAVENOUS | Status: DC

## 2017-07-26 NOTE — Progress Notes (Signed)
Report to PACU, RN, vss, BBS= Clear.  

## 2017-07-26 NOTE — Progress Notes (Signed)
Called to room to assist during endoscopic procedure.  Patient ID and intended procedure confirmed with present staff. Received instructions for my participation in the procedure from the performing physician.  

## 2017-07-26 NOTE — Op Note (Signed)
[place] Patient Name: [name] Procedure Date: 07/26/2017 8:51 AM MRN: [id number] Endoscopist: Jerene Bears , MD Age: 62 Referring MD:  Date of Birth: 09/12/1955 Gender: Male Account #: [id number] Procedure:                Colonoscopy Indications:              Surveillance: Personal history of adenomatous                            polyps on last colonoscopy 5 years ago Medicines:                Monitored Anesthesia Care Procedure:                Pre-Anesthesia Assessment:                           - Prior to the procedure, a History and Physical                            was performed, and patient medications and                            allergies were reviewed. The patient's tolerance of                            previous anesthesia was also reviewed. The risks                            and benefits of the procedure and the sedation                            options and risks were discussed with the patient.                            All questions were answered, and informed consent                            was obtained. Prior Anticoagulants: The patient has                            taken no previous anticoagulant or antiplatelet                            agents. ASA Grade Assessment: II - A patient with                            mild systemic disease. After reviewing the risks                            and benefits, the patient was deemed in                            satisfactory condition to undergo the procedure.  After obtaining informed consent, the colonoscope                            was passed under direct vision. Throughout the                            procedure, the patient's blood pressure, pulse, and                            oxygen saturations were monitored continuously. The                            Colonoscope was introduced through the anus and                            advanced to the the cecum,  identified by                            appendiceal orifice and ileocecal valve. The                            colonoscopy was performed without difficulty. The                            patient tolerated the procedure well. The quality                            of the bowel preparation was good. The ileocecal                            valve, appendiceal orifice, and rectum were                            photographed. Scope In: 8:59:57 AM Scope Out: 9:11:16 AM Scope Withdrawal Time: 0 hours 9 minutes 4 seconds  Total Procedure Duration: 0 hours 11 minutes 19 seconds  Findings:                 The digital rectal exam was normal.                           Two sessile polyps were found in the descending                            colon. The polyps were 5 to 6 mm in size. These                            polyps were removed with a cold snare. Resection                            and retrieval were complete.                           A few small-mouthed diverticula were found in the  sigmoid colon.                           The retroflexed view of the distal rectum and anal                            verge was normal and showed no anal or rectal                            abnormalities. Complications:            No immediate complications. Estimated Blood Loss:     Estimated blood loss was minimal. Impression:               - Two 5 to 6 mm polyps in the descending colon,                            removed with a cold snare. Resected and retrieved.                           - Diverticulosis in the sigmoid colon.                           - The distal rectum and anal verge are normal on                            retroflexion view. Recommendation:           - Patient has a contact number available for                            emergencies. The signs and symptoms of potential                            delayed complications were discussed with the                             patient. Return to normal activities tomorrow.                            Written discharge instructions were provided to the                            patient.                           - Resume previous diet.                           - Continue present medications.                           - Await pathology results.                           - Repeat colonoscopy is recommended for  surveillance. The colonoscopy date will be                            determined after pathology results from today's                            exam become available for review. Jerene Bears, MD 07/26/2017 9:14:36 AM This report has been signed electronically.

## 2017-07-26 NOTE — Patient Instructions (Signed)
YOU HAD AN ENDOSCOPIC PROCEDURE TODAY AT THE [facility]:   Refer to the procedure report that was given to you for any specific questions about what was found during the examination.  If the procedure report does not answer your questions, please call your gastroenterologist to clarify.  If you requested that your care partner not be given the details of your procedure findings, then the procedure report has been included in a sealed envelope for you to review at your convenience later.  YOU SHOULD EXPECT: Some feelings of bloating in the abdomen. Passage of more gas than usual.  Walking can help get rid of the air that was put into your GI tract during the procedure and reduce the bloating. If you had a lower endoscopy (such as a colonoscopy or flexible sigmoidoscopy) you may notice spotting of blood in your stool or on the toilet paper. If you underwent a bowel prep for your procedure, you may not have a normal bowel movement for a few days.  Please Note:  You might notice some irritation and congestion in your nose or some drainage.  This is from the oxygen used during your procedure.  There is no need for concern and it should clear up in a day or so.  SYMPTOMS TO REPORT IMMEDIATELY:   Following lower endoscopy (colonoscopy or flexible sigmoidoscopy):  Excessive amounts of blood in the stool  Significant tenderness or worsening of abdominal pains  Swelling of the abdomen that is new, acute  Fever of 100F or higher  For urgent or emergent issues, a gastroenterologist can be reached at any hour by calling [phone].   DIET:  We do recommend a small meal at first, but then you may proceed to your regular diet.  Drink plenty of fluids but you should avoid alcoholic beverages for 24 hours.  ACTIVITY:  You should plan to take it easy for the rest of today and you should NOT DRIVE or use heavy machinery until tomorrow (because of the sedation medicines used during the test).     FOLLOW UP: Our staff will call the number listed on your records the next business day following your procedure to check on you and address any questions or concerns that you may have regarding the information given to you following your procedure. If we do not reach you, we will leave a message.  However, if you are feeling well and you are not experiencing any problems, there is no need to return our call.  We will assume that you have returned to your regular daily activities without incident.  If any biopsies were taken you will be contacted by phone or by letter within the next 1-3 weeks.  Please call us at [phone] if you have not heard about the biopsies in 3 weeks.   Await for biopsy results to determine next repeat Colonoscopy screening Polyps (handout given) Diverticulosis (handout given)   SIGNATURES/CONFIDENTIALITY: You and/or your care partner have signed paperwork which will be entered into your electronic medical record.  These signatures attest to the fact that that the information above on your After Visit Summary has been reviewed and is understood.  Full responsibility of the confidentiality of this discharge information lies with you and/or your care-partner. 

## 2017-07-29 ENCOUNTER — Telehealth: Payer: Self-pay

## 2017-07-29 ENCOUNTER — Telehealth: Payer: Self-pay | Admitting: *Deleted

## 2017-07-29 NOTE — Telephone Encounter (Signed)
  Follow up Call-  Call back number 07/26/2017  Post procedure Call Back phone  # [phone] Jaynee Eagles) sister OK to call her  Permission to leave phone message Yes  Some recent data might be hidden     [facility] Gerrica Cygan/Follow-up call

## 2017-07-29 NOTE — Telephone Encounter (Signed)
No answer. Number identifier. Message left to call if questions or concerns. 

## 2017-07-30 ENCOUNTER — Encounter: Payer: Self-pay | Admitting: Internal Medicine

## 2017-08-07 ENCOUNTER — Encounter: Payer: Self-pay | Admitting: *Deleted

## 2017-12-19 ENCOUNTER — Other Ambulatory Visit: Payer: Self-pay

## 2017-12-19 DIAGNOSIS — I712 Thoracic aortic aneurysm, without rupture, unspecified: Secondary | ICD-10-CM

## 2018-02-26 ENCOUNTER — Other Ambulatory Visit: Payer: Self-pay

## 2018-03-03 ENCOUNTER — Ambulatory Visit: Payer: Self-pay | Admitting: Surgery

## 2018-03-10 ENCOUNTER — Ambulatory Visit: Payer: Self-pay | Admitting: Surgery

## 2018-03-26 ENCOUNTER — Telehealth: Payer: Self-pay | Admitting: Nurse Practitioner

## 2018-03-26 NOTE — Telephone Encounter (Signed)
Phone call to patients wife regarding 13 hr prep. She verbalized understanding. Will be by to pick up rx. 10/7 0000- 50mg  Prednisone 10/7 0600- 50mg  Prednisone 10/7 1200- 50mg  Prednisone and 50mg  Benadryl

## 2018-03-31 ENCOUNTER — Other Ambulatory Visit: Payer: Self-pay

## 2018-03-31 ENCOUNTER — Ambulatory Visit: Payer: Medicaid Other | Admitting: Surgery

## 2018-03-31 ENCOUNTER — Ambulatory Visit
Admission: RE | Admit: 2018-03-31 | Discharge: 2018-03-31 | Disposition: A | Discharge status: Home / Self Care | Payer: Medicaid Other | Source: Ambulatory Visit | Attending: Surgery | Admitting: Surgery

## 2018-03-31 ENCOUNTER — Ambulatory Visit (INDEPENDENT_AMBULATORY_CARE_PROVIDER_SITE_OTHER): Payer: Medicaid Other | Admitting: Surgery

## 2018-03-31 ENCOUNTER — Ambulatory Visit: Payer: Medicaid Other

## 2018-03-31 ENCOUNTER — Encounter: Payer: Self-pay | Admitting: Surgery

## 2018-03-31 VITALS — BP 143/75 | HR 76 | Temp 97.6°F | Resp 16 | Ht 69.0 in | Wt 120.0 lb

## 2018-03-31 DIAGNOSIS — I712 Thoracic aortic aneurysm, without rupture, unspecified: Secondary | ICD-10-CM

## 2018-03-31 MED ORDER — IOPAMIDOL (ISOVUE-370) INJECTION 76%
75.0000 mL | Freq: Once | INTRAVENOUS | Status: AC | PRN
  Administered 2018-03-31: 75 mL via INTRAVENOUS

## 2018-03-31 NOTE — Progress Notes (Signed)
Vascular and Vein Specialist of [place]  Patient name: [name] MRN: [id number] DOB: 1956/04/08 Sex: male   REASON FOR VISIT:    Follow up  HISOTRY OF PRESENT ILLNESS:    [name] is a 62 y.o. male who is back for follow-up.  He is status post endovascular repair of a descending thoracic aortic aneurysm on 06/12/2015.  He reports no interval symptoms since I last saw him.  Specifically denies chest pain or back pain.  He has undergone a colonoscopy with polypectomy.  This was negative for malignancy.  PAST MEDICAL HISTORY:   Past Medical History:  Diagnosis Date  . Alcohol abuse    h/o, quit april 5th, 2013  . Anxiety   . Aortic aneurysm ([place])   . Blood transfusion   . Cardiomegaly    EF 25-30 % in 2005, EF 50-55% Oct 2013  . Depression   . Hyperlipidemia   . Hypertension   . Lumbar herniated disc   . Memory deficits   . Radiculopathy   . Seizures ([place])    last seizure several years ago- per sister 66 or below at [facility] 07-12-17  . TBI (traumatic brain injury) [facility])    Sept 1988  . Urinary urgency      FAMILY HISTORY:   Family History  Problem Relation Age of Onset  . Liver disease Mother   . Hypertension Mother   . Breast cancer Maternal Aunt   . Esophageal cancer Cousin   . Esophageal cancer Maternal Uncle   . Colon cancer Neg Hx   . Stomach cancer Neg Hx   . Colon polyps Neg Hx   . Rectal cancer Neg Hx     SOCIAL HISTORY:   Social History   Tobacco Use  . Smoking status: Current Some Day Smoker    Packs/day: 0.10    Types: Cigarettes  . Smokeless tobacco: Never Used  . Tobacco comment: 1 cigarette per day.   Substance Use Topics  . Alcohol use: No    Alcohol/week: 0.0 standard drinks    Comment: per sister, last drink was apr 5th- hx of ETOH     ALLERGIES:   Allergies  Allergen Reactions  . Isovue [Iopamidol] Hives    Pt broke out in one hive on his chest after contrast on 01/03/15.   Pt will need full premeds in the future per Dr Martinique.       CURRENT MEDICATIONS:   Current Outpatient Medications  Medication Sig Dispense Refill  . acetaminophen (TYLENOL) 500 MG tablet Take 1,000 mg by mouth daily.    Marland Kitchen atenolol-chlorthalidone (TENORETIC) 50-25 MG per tablet Take 1 tablet by mouth daily. (Patient taking differently: Take 1 tablet by mouth daily. 1/2 tablet daily per sister) 30 tablet 5  . Cholecalciferol (VITAMIN D-3) 1000 UNITS CAPS Take 1 capsule by mouth daily.    . DimenhyDRINATE (DRIMINATE PO) Take by mouth as needed. For nausea    . feeding supplement, ENSURE COMPLETE, (ENSURE COMPLETE) LIQD Take 237 mLs by mouth 3 (three) times daily between meals.     . GuaiFENesin (SILTUSSIN SA PO) Take by mouth as needed.    . loperamide (IMODIUM) 2 MG capsule Take by mouth as needed for diarrhea or loose stools.    Marland Kitchen LORazepam (ATIVAN) 0.5 MG tablet Take 0.5 mg by mouth every 8 (eight) hours as needed for anxiety.    . magnesium hydroxide (MILK OF MAGNESIA) 400 MG/5ML suspension Take by mouth daily as needed for  mild constipation.    . mirtazapine (REMERON) 15 MG tablet Take 15 mg by mouth at bedtime.    . Multiple Vitamin (MULITIVITAMIN WITH MINERALS) TABS Take 1 tablet by mouth daily.    . Multiple Vitamins-Minerals (CERTAVITE/ANTIOXIDANTS PO) Take by mouth daily.    Marland Kitchen Neomycin-Bacitracin-Polymyxin (TRIPLE ANTIBIOTIC FIRST AID) [phone] OINT Apply topically as needed.    Marland Kitchen oxyCODONE-acetaminophen (PERCOCET/ROXICET) 5-325 MG per tablet Take 1 tablet by mouth every 6 (six) hours as needed. (Patient not taking: Reported on 01/23/2016) 30 tablet 0  . phenytoin (DILANTIN) 100 MG ER capsule Take 200 mg by mouth at bedtime.     . phenytoin (DILANTIN) 30 MG ER capsule Take 60 mg by mouth at bedtime.     . pravastatin (PRAVACHOL) 40 MG tablet Take 1 tablet (40 mg total) by mouth every evening. For high cholesterol 30 tablet 11  . Sodium Phosphates (FLEET ENEMA RE) Place rectally as  needed.     Current Facility-Administered Medications  Medication Dose Route Frequency Provider Last Rate Last Dose  . 0.9 %  sodium chloride infusion  500 mL Intravenous Once Pyrtle, Lajuan Lines, MD        REVIEW OF SYSTEMS:   [X]  denotes positive finding, [ ]  denotes negative finding Cardiac  Comments:  Chest pain or chest pressure:    Shortness of breath upon exertion:    Short of breath when lying flat:    Irregular heart rhythm:        Vascular    Pain in calf, thigh, or hip brought on by ambulation:    Pain in feet at night that wakes you up from your sleep:     Blood clot in your veins:    Leg swelling:         Pulmonary    Oxygen at home:    Productive cough:     Wheezing:         Neurologic    Sudden weakness in arms or legs:     Sudden numbness in arms or legs:     Sudden onset of difficulty speaking or slurred speech:    Temporary loss of vision in one eye:     Problems with dizziness:         Gastrointestinal    Blood in stool:     Vomited blood:         Genitourinary    Burning when urinating:     Blood in urine:        Psychiatric    Major depression:         Hematologic    Bleeding problems:    Problems with blood clotting too easily:        Skin    Rashes or ulcers:        Constitutional    Fever or chills:      PHYSICAL EXAM:   There were no vitals filed for this visit.  GENERAL: The patient is a well-nourished male, in no acute distress. The vital signs are documented above. CARDIAC: There is a regular rate and rhythm.  VASCULAR: No carotid bruits. PULMONARY: Non-labored respirations MUSCULOSKELETAL: There are no major deformities or cyanosis. NEUROLOGIC: No focal weakness or paresthesias are detected. SKIN: There are no ulcers or rashes noted. PSYCHIATRIC: The patient has a normal affect.  STUDIES:   I have reviewed his CT scan with the following findings:  Stable stent graft repair of transverse aortic arch and  descending thoracic aorta. No endoleak  is noted. Stable aneurysmal dilatation of midportion of thoracic aorta is noted at 4.8 cm.   MEDICAL ISSUES:   Stable descending thoracic aortic aneurysm, status post stent graft repair.  He will have a follow-up CT angiogram for surveillance in 2 years.    [name], MD [facility] [phone] Pager [phone]

## 2019-11-16 ENCOUNTER — Emergency Department (HOSPITAL_COMMUNITY): Payer: Medicaid Other

## 2019-11-16 ENCOUNTER — Telehealth: Payer: Self-pay

## 2019-11-16 ENCOUNTER — Emergency Department (HOSPITAL_COMMUNITY)
Admission: EM | Admit: 2019-11-16 | Discharge: 2019-11-16 | Disposition: A | Discharge status: Home / Self Care | Payer: Medicaid Other | Attending: Emergency Medicine | Admitting: Emergency Medicine

## 2019-11-16 DIAGNOSIS — Z87820 Personal history of traumatic brain injury: Secondary | ICD-10-CM | POA: Insufficient documentation

## 2019-11-16 DIAGNOSIS — R0789 Other chest pain: Secondary | ICD-10-CM | POA: Diagnosis not present

## 2019-11-16 DIAGNOSIS — Z79899 Other long term (current) drug therapy: Secondary | ICD-10-CM | POA: Diagnosis not present

## 2019-11-16 DIAGNOSIS — I10 Essential (primary) hypertension: Secondary | ICD-10-CM | POA: Insufficient documentation

## 2019-11-16 DIAGNOSIS — Z720 Tobacco use: Secondary | ICD-10-CM | POA: Insufficient documentation

## 2019-11-16 LAB — CBC WITH DIFFERENTIAL/PLATELET
Abs Immature Granulocytes: 0.03 10*3/uL (ref 0.00–0.07)
Basophils Absolute: 0 10*3/uL (ref 0.0–0.1)
Basophils Relative: 0 %
Eosinophils Absolute: 0.1 10*3/uL (ref 0.0–0.5)
Eosinophils Relative: 1 %
HCT: 42.9 % (ref 39.0–52.0)
Hemoglobin: 14.1 g/dL (ref 13.0–17.0)
Immature Granulocytes: 0 %
Lymphocytes Relative: 21 %
Lymphs Abs: 2.4 10*3/uL (ref 0.7–4.0)
MCH: 30.2 pg (ref 26.0–34.0)
MCHC: 32.9 g/dL (ref 30.0–36.0)
MCV: 91.9 fL (ref 80.0–100.0)
Monocytes Absolute: 1.1 10*3/uL — ABNORMAL HIGH (ref 0.1–1.0)
Monocytes Relative: 9 %
Neutro Abs: 7.6 10*3/uL (ref 1.7–7.7)
Neutrophils Relative %: 69 %
Platelets: 183 10*3/uL (ref 150–400)
RBC: 4.67 MIL/uL (ref 4.22–5.81)
RDW: 12.5 % (ref 11.5–15.5)
WBC: 11.2 10*3/uL — ABNORMAL HIGH (ref 4.0–10.5)
nRBC: 0 % (ref 0.0–0.2)

## 2019-11-16 LAB — BASIC METABOLIC PANEL
Anion gap: 13 (ref 5–15)
BUN: 21 mg/dL (ref 8–23)
CO2: 32 mmol/L (ref 22–32)
Calcium: 9.8 mg/dL (ref 8.9–10.3)
Chloride: 90 mmol/L — ABNORMAL LOW (ref 98–111)
Creatinine, Ser: 1.05 mg/dL (ref 0.61–1.24)
GFR calc Af Amer: >60 mL/min (ref >60)
GFR calc non Af Amer: >60 mL/min (ref >60)
Glucose, Bld: 93 mg/dL (ref 70–99)
Potassium: 3.6 mmol/L (ref 3.5–5.1)
Sodium: 135 mmol/L (ref 135–145)

## 2019-11-16 LAB — URINALYSIS, ROUTINE W REFLEX MICROSCOPIC
Bilirubin Urine: NEGATIVE
Glucose, UA: NEGATIVE mg/dL
Hgb urine dipstick: NEGATIVE
Ketones, ur: NEGATIVE mg/dL
Leukocytes,Ua: NEGATIVE
Nitrite: NEGATIVE
Protein, ur: NEGATIVE mg/dL
Specific Gravity, Urine: 1.015 (ref 1.005–1.030)
pH: 5 (ref 5.0–8.0)

## 2019-11-16 LAB — TROPONIN I (HIGH SENSITIVITY)
Troponin I (High Sensitivity): 6 ng/L (ref <18)
Troponin I (High Sensitivity): 5 ng/L (ref <18)

## 2019-11-16 LAB — CBG MONITORING, ED: Glucose-Capillary: 90 mg/dL (ref 70–99)

## 2019-11-16 MED ORDER — ASPIRIN 81 MG PO CHEW
324.0000 mg | CHEWABLE_TABLET | Freq: Once | ORAL | Status: DC
  Filled 2019-11-16: qty 4

## 2019-11-16 NOTE — Telephone Encounter (Signed)
Telephone call received from patient's sister with reports of patient having chest pain. Assisted living facility contacted EMS and transported to [facility] for evaluation. Minette Brine, RN

## 2019-11-16 NOTE — ED Notes (Signed)
Pt aware of need for urine sample, urinal at bedside 

## 2019-11-16 NOTE — ED Triage Notes (Signed)
Pt presents from assisted living with EMS for CP and pressure not relieved with antacids that started this AM. H/o aortic aneurysm with repair in 2015, stable at visits since.   H/o TBI in 1998 with short term memory loss, sister at bedside but will need to be called later if she leaves.  EMS exam - EKG unremarkable, 146/52, 72, 100%RA  324 ASA given by EMS
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
## 2019-11-16 NOTE — Discharge Instructions (Addendum)
You have been seen today for chest pain. Please read and follow all provided instructions. Return to the emergency room for worsening condition or new concerning symptoms.     The work up today is unremarkable. The cardiac enzymes were normal. The chest xray and EKG were also reassuring.  1. Medications:  Continue usual home medications Take medications as prescribed. Please review all of the medicines and only take them if you do not have an allergy to them.   2. Treatment: rest, drink plenty of fluids  3. Follow Up:  Please follow up with primary care provider by scheduling an appointment as soon as possible for a visit  -Also follow up with cardiology as we discussed   It is also a possibility that you have an allergic reaction to any of the medicines that you have been prescribed - Everybody reacts differently to medications and while MOST people have no trouble with most medicines, you may have a reaction such as nausea, vomiting, rash, swelling, shortness of breath. If this is the case, please stop taking the medicine immediately and contact your physician.  ?

## 2019-11-16 NOTE — ED Notes (Signed)
Patient verbalizes understanding of discharge instructions. Opportunity for questioning and answers were provided. Armband removed by staff, pt discharged from ED with sister to return to assisted living. Has cane at discharge

## 2020-04-19 ENCOUNTER — Other Ambulatory Visit: Payer: Self-pay

## 2020-04-19 DIAGNOSIS — I714 Abdominal aortic aneurysm, without rupture, unspecified: Secondary | ICD-10-CM

## 2020-04-19 DIAGNOSIS — I712 Thoracic aortic aneurysm, without rupture, unspecified: Secondary | ICD-10-CM

## 2020-05-16 ENCOUNTER — Other Ambulatory Visit: Payer: Self-pay

## 2020-05-16 MED ORDER — PREDNISONE 50 MG PO TABS: ORAL_TABLET | ORAL | 0 refills | Status: AC

## 2020-05-23 ENCOUNTER — Other Ambulatory Visit: Payer: Self-pay

## 2020-05-23 ENCOUNTER — Ambulatory Visit (INDEPENDENT_AMBULATORY_CARE_PROVIDER_SITE_OTHER): Payer: Medicaid Other | Admitting: Surgery

## 2020-05-23 ENCOUNTER — Ambulatory Visit
Admission: RE | Admit: 2020-05-23 | Discharge: 2020-05-23 | Disposition: A | Discharge status: Home / Self Care | Payer: Medicaid Other | Source: Ambulatory Visit | Attending: Surgery | Admitting: Surgery

## 2020-05-23 ENCOUNTER — Encounter: Payer: Self-pay | Admitting: Surgery

## 2020-05-23 VITALS — BP 124/76 | HR 72 | Temp 98.1°F | Resp 20 | Ht 69.0 in | Wt 118.0 lb

## 2020-05-23 DIAGNOSIS — I712 Thoracic aortic aneurysm, without rupture, unspecified: Secondary | ICD-10-CM

## 2020-05-23 MED ORDER — IOPAMIDOL (ISOVUE-370) INJECTION 76%
75.0000 mL | Freq: Once | INTRAVENOUS | Status: AC | PRN
  Administered 2020-05-23: 75 mL via INTRAVENOUS

## 2020-05-23 NOTE — Progress Notes (Signed)
[facility] [place]  Patient name: [name] MRN: [id number] DOB: [birth year] Sex: male   REASON FOR VISIT:    Follow up  HISOTRY OF PRESENT ILLNESS:     [name] a 64 y.o.malewho is back for follow-up. He is status post endovascular repair of a descending thoracic aortic aneurysm on 06/12/2015.  Maximum aortic diameter was 5.2 cm.  He has a history of ischemic cardiomyopathy.  He is a current smoker.  He is medically managed for hypertension.  He takes a statin for hypercholesterolemia.   PAST MEDICAL HISTORY:   Past Medical History:  Diagnosis Date  . Alcohol abuse    h/o, quit april 5th, 2013  . Anxiety   . Aortic aneurysm ([place])   . Blood transfusion   . Cardiomegaly    EF 25-30 % in 2005, EF 50-55% Oct 2013  . Depression   . Hyperlipidemia   . Hypertension   . Lumbar herniated disc   . Memory deficits   . Radiculopathy   . Seizures ([place])    last seizure several years ago- per sister 41 or below at [facility] 07-12-17  . TBI (traumatic brain injury) [facility])    Sept 1988  . Urinary urgency      FAMILY HISTORY:   Family History  Problem Relation Age of Onset  . Liver disease Mother   . Hypertension Mother   . Breast cancer Maternal Aunt   . Esophageal cancer Cousin   . Esophageal cancer Maternal Uncle   . Colon cancer Neg Hx   . Stomach cancer Neg Hx   . Colon polyps Neg Hx   . Rectal cancer Neg Hx     SOCIAL HISTORY:   Social History   Tobacco Use  . Smoking status: Current Some Day Smoker    Packs/day: 0.10    Types: Cigarettes  . Smokeless tobacco: Never Used  . Tobacco comment: 1 cigarette per day.   Substance Use Topics  . Alcohol use: No    Alcohol/week: 0.0 standard drinks    Comment: per sister, last drink was apr 5th- hx of ETOH     ALLERGIES:   Allergies  Allergen Reactions  . Isovue [Iopamidol] Hives    Pt broke out in one hive on his chest after  contrast on 01/03/15.  Pt will need full premeds in the future per Dr Martinique.       CURRENT MEDICATIONS:   Current Outpatient Medications  Medication Sig Dispense Refill  . acetaminophen (TYLENOL) 500 MG tablet Take 1,000 mg by mouth daily.    Marland Kitchen atenolol-chlorthalidone (TENORETIC) 50-25 MG per tablet Take 1 tablet by mouth daily. (Patient taking differently: Take 1 tablet by mouth daily. 1/2 tablet daily per sister) 30 tablet 5  . Cholecalciferol 800 UNIT (20 MCG) TABS Take by mouth.    . feeding supplement, ENSURE COMPLETE, (ENSURE COMPLETE) LIQD Take 237 mLs by mouth 3 (three) times daily between meals.     Marland Kitchen LORazepam (ATIVAN) 0.5 MG tablet Take 0.5 mg by mouth every 8 (eight) hours as needed for anxiety.    . mirtazapine (REMERON) 15 MG tablet Take 15 mg by mouth at bedtime.    . Multiple Vitamin (MULITIVITAMIN WITH MINERALS) TABS Take 1 tablet by mouth daily.    . phenytoin (DILANTIN) 100 MG ER capsule Take 200 mg by mouth at bedtime.     . phenytoin (DILANTIN) 30 MG ER capsule Take 60 mg by mouth at bedtime.     Marland Kitchen  pravastatin (PRAVACHOL) 40 MG tablet Take 1 tablet (40 mg total) by mouth every evening. For high cholesterol 30 tablet 11  . predniSONE (DELTASONE) 50 MG tablet Pt is to take Prednisone 50mg  PO 05/23/20 @ 1900, 11/30 @ 0100 and 0700; Benadryl 50mg  PO 11/30 @ 0700. (Patient taking differently: Pt is to take Prednisone 50mg  PO 05/22/20 @ 2100, 11/29 @ 0300 and 0900; Benadryl 50mg  PO 11/29 @ 0900.) 3 tablet 0  . Sodium Phosphates (FLEET ENEMA RE) Place rectally as needed.     Current Facility-Administered Medications  Medication Dose Route Frequency Provider Last Rate Last Admin  . 0.9 %  sodium chloride infusion  500 mL Intravenous Once Pyrtle, Lajuan Lines, MD        REVIEW OF SYSTEMS:   [X]  denotes positive finding, [ ]  denotes negative finding Cardiac  Comments:  Chest pain or chest pressure:    Shortness of breath upon exertion:    Short of breath when lying flat:      Irregular heart rhythm:        Vascular    Pain in calf, thigh, or hip brought on by ambulation:    Pain in feet at night that wakes you up from your sleep:     Blood clot in your veins:    Leg swelling:         Pulmonary    Oxygen at home:    Productive cough:     Wheezing:         Neurologic    Sudden weakness in arms or legs:     Sudden numbness in arms or legs:     Sudden onset of difficulty speaking or slurred speech:    Temporary loss of vision in one eye:     Problems with dizziness:         Gastrointestinal    Blood in stool:     Vomited blood:         Genitourinary    Burning when urinating:     Blood in urine:        Psychiatric    Major depression:         Hematologic    Bleeding problems:    Problems with blood clotting too easily:        Skin    Rashes or ulcers:        Constitutional    Fever or chills:      PHYSICAL EXAM:   There were no vitals filed for this visit.  GENERAL: The patient is a well-nourished male, in no acute distress. The vital signs are documented above. CARDIAC: There is a regular rate and rhythm.  PULMONARY: Non-labored respirations ABDOMEN: Soft and non-tender MUSCULOSKELETAL: There are no major deformities or cyanosis. NEUROLOGIC: No focal weakness or paresthesias are detected. SKIN: There are no ulcers or rashes noted. PSYCHIATRIC: The patient has a normal affect.  STUDIES:   I have reviewed his CTA with the following findings: 1. Thoracic aortic aneurysm post endograft placement measuring up to 4.8 cm in diameter as before. 2. RIGHT upper lobe nodule in the setting of pulmonary emphysema shows fissural distortion. 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 This area shows very slow enlargement over time dating back to 2016 where was nearly imperceptible. 3. Mild cardiomegaly. 4. Aortic atherosclerosis.  Pulmonary emphysema.  MEDICAL ISSUES:   TAAA:  Remains in good position.  Maximum  diameter is 4.8 cm without evidence of endoleak.  I will repeat his CT  scan in 3 years.  Pulmonary nodule: This was apparent on today's scan.  It shows very slow enlargement since 2016.  I am going to refer him to pulmonary for further evaluation.    Leia Alf, MD, FACS [facility] [phone] Pager [phone]

## 2020-06-09 ENCOUNTER — Encounter: Payer: Self-pay | Admitting: Pulmonary Disease

## 2020-06-09 ENCOUNTER — Ambulatory Visit (INDEPENDENT_AMBULATORY_CARE_PROVIDER_SITE_OTHER): Payer: Medicaid Other | Admitting: Pulmonary Disease

## 2020-06-09 ENCOUNTER — Other Ambulatory Visit: Payer: Self-pay

## 2020-06-09 VITALS — BP 120/68 | HR 70 | Temp 98.4°F | Ht 69.0 in | Wt 108.4 lb

## 2020-06-09 DIAGNOSIS — R911 Solitary pulmonary nodule: Secondary | ICD-10-CM

## 2020-06-09 DIAGNOSIS — F17200 Nicotine dependence, unspecified, uncomplicated: Secondary | ICD-10-CM | POA: Diagnosis not present

## 2020-06-09 DIAGNOSIS — F79 Unspecified intellectual disabilities: Secondary | ICD-10-CM
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
## 2020-06-09 NOTE — Patient Instructions (Signed)
Thank you for visiting Dr. Valeta Harms at [facility]. Today we recommend the following:  Orders Placed This Encounter  Procedures  . NM PET Image Initial (PI) Skull Base To Thigh  . CT Super D Chest Wo Contrast  . Ambulatory referral to Pulmonology   Bronchoscopy scheduled for 07/01/2020. Please expect calls for setting up CT and PET  Please expect calls from peri-operative services.   Return in about 5 weeks (around 07/14/2020) for with APP or Dr. Valeta Harms.    Please do your part to reduce the spread of COVID-19.
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
## 2020-06-10 ENCOUNTER — Telehealth: Payer: Self-pay | Admitting: Pulmonary Disease

## 2020-06-10 NOTE — Telephone Encounter (Signed)
I have scheduled the following for this patient:  SuperD CT 12/30 @ 2:30 @ [facility]  1/3 @ 9 AM @ [facility]  1/4 @ 2:30 @ [facility]  07/01/20 @ 7:30 AM @ [facility]  Patient's daughter, [name], has been made aware of all appointments.

## 2020-06-10 NOTE — Telephone Encounter (Signed)
Called spoke with [name] Let her know Dr. Juline Patch recommendations She voiced understanding and will await the call from pre-operative services.   Nothing further needed at this time.

## 2020-06-10 NOTE — Telephone Encounter (Signed)
[name], pt's daughter,  would like to know if it is ok for pt to take all medications on the date of the ENB.   [name] can be reached at [phone].

## 2020-06-10 NOTE — Telephone Encounter (Signed)
PCCM:  Pre-operative services will go over the medications her can take But usually yes he can take all of them in the morning with a sip of water.   Garner Nash, DO [facility] 06/10/2020 11:56 AM

## 2020-06-22 ENCOUNTER — Ambulatory Visit (HOSPITAL_COMMUNITY): Payer: Medicaid Other

## 2020-06-23 ENCOUNTER — Ambulatory Visit
Admission: RE | Admit: 2020-06-23 | Discharge: 2020-06-23 | Disposition: A | Discharge status: Home / Self Care | Payer: Medicaid Other | Source: Ambulatory Visit | Attending: Pulmonary Disease | Admitting: Pulmonary Disease

## 2020-06-23 DIAGNOSIS — R911 Solitary pulmonary nodule: Secondary | ICD-10-CM

## 2020-06-27 ENCOUNTER — Ambulatory Visit (HOSPITAL_COMMUNITY)
Admission: RE | Admit: 2020-06-27 | Discharge: 2020-06-27 | Disposition: A | Discharge status: Home / Self Care | Payer: Medicaid Other | Source: Ambulatory Visit | Attending: Pulmonary Disease | Admitting: Pulmonary Disease

## 2020-06-27 ENCOUNTER — Other Ambulatory Visit: Payer: Self-pay

## 2020-06-27 DIAGNOSIS — R911 Solitary pulmonary nodule: Secondary | ICD-10-CM | POA: Diagnosis present

## 2020-06-27 LAB — GLUCOSE, CAPILLARY: Glucose-Capillary: 111 mg/dL — ABNORMAL HIGH (ref 70–99)

## 2020-06-27 MED ORDER — FLUDEOXYGLUCOSE F - 18 (FDG) INJECTION
5.2800 | Freq: Once | INTRAVENOUS | Status: AC
  Administered 2020-06-27: 5.28 via INTRAVENOUS

## 2020-06-28 ENCOUNTER — Other Ambulatory Visit (HOSPITAL_COMMUNITY)
Admission: RE | Admit: 2020-06-28 | Discharge: 2020-06-28 | Disposition: A | Discharge status: Home / Self Care | Payer: Medicaid Other | Source: Ambulatory Visit | Attending: Pulmonary Disease | Admitting: Pulmonary Disease

## 2020-06-28 ENCOUNTER — Other Ambulatory Visit (HOSPITAL_COMMUNITY): Payer: Medicaid Other

## 2020-06-28 DIAGNOSIS — Z20822 Contact with and (suspected) exposure to covid-19: Secondary | ICD-10-CM | POA: Insufficient documentation

## 2020-06-28 DIAGNOSIS — Z01812 Encounter for preprocedural laboratory examination: Secondary | ICD-10-CM | POA: Insufficient documentation

## 2020-06-29 LAB — SARS CORONAVIRUS 2 (TAT 6-24 HRS): SARS Coronavirus 2: NEGATIVE

## 2020-06-30 ENCOUNTER — Encounter (HOSPITAL_COMMUNITY): Payer: Self-pay | Admitting: Pulmonary Disease

## 2020-06-30 NOTE — Progress Notes (Signed)
Procedure is scheduled on Friday, January 7.  Report to [facility], Main Entrance or Entrance "A" at 5:30 AM                Your surgery or procedure is scheduled to begin at 7:30 AM   Call this number if you have problems the morning of surgery: [phone]  This is the number for the Pre- Surgical Desk.  >>>>>Please send patient's Medication Record with medications administrated documentation. ( this information is required prior to OR. This includes medications that may have been on hold for surgery)<<<<<     Remember:  Do not eat or drink after midnight.    Take these medicines the morning of surgery with A SIP OF WATER : Buspirone. If needed: Tylenol.   STOP taking Aspirin, Aspirin Products (Goody Powder, Excedrin Migraine), Ibuprofen (Advil), Naproxen (Aleve), Vitamins and Herbal Products (ie Fish Oil).   Patient should have had a morning shower, Dry off with a clean towel.  Do not apply any deodorants/lotions, powders or colognes.  Please wear clean clothes to the hospital/surgery center.   Remember to brush your teeth WITH YOUR REGULAR TOOTHPASTE.  Do not wear jewelry, make-up.         .   Men may shave face and neck.  Do not bring valuables to the hospital.

## 2020-06-30 NOTE — Progress Notes (Addendum)
I spoke to [name], a Med Tech at [employer], who reported that [name] is alert and oriented and can shower himself.  I spoke to [name], [name]'s sister who reports that [name] has short term memory loss due to a Tramatic Brain Injury. [name] is transporting [name] to the hospital and will be will him in pre- op.  Mr. Bisceglia tested negative for Covid on 06/28/20. [name] has not been in quarantine at [facility]  [name] will have to be retested on arrival.

## 2020-06-30 NOTE — Anesthesia Preprocedure Evaluation (Signed)
Anesthesia Evaluation  Patient identified by MRN, date of birth, ID band Patient awake    Reviewed: Allergy & Precautions, H&P , NPO status , Patient's Chart, lab work & pertinent test results  Airway Mallampati: I  TM Distance: >3 FB Neck ROM: Full    Dental no notable dental hx. (+) Edentulous Upper, Edentulous Lower   Pulmonary Current Smoker and Patient abstained from smoking.,    Pulmonary exam normal breath sounds clear to auscultation       Cardiovascular Exercise Tolerance: Good hypertension, Pt. on medications and Pt. on home beta blockers Normal cardiovascular exam Rhythm:Regular Rate:Normal     Neuro/Psych Seizures -, Well Controlled,  PSYCHIATRIC DISORDERS Anxiety Depression Dementia  TBI (traumatic brain injury)     Neuromuscular disease    GI/Hepatic negative GI ROS, (+)     substance abuse  alcohol use,   Endo/Other  negative endocrine ROS  Renal/GU negative Renal ROS  negative genitourinary   Musculoskeletal negative musculoskeletal ROS (+)   Abdominal   Peds negative pediatric ROS (+)  Hematology negative hematology ROS (+)   Anesthesia Other Findings   Reproductive/Obstetrics negative OB ROS                            Anesthesia Physical Anesthesia Plan  ASA: III  Anesthesia Plan: General   Post-op Pain Management:    Induction: Intravenous  PONV Risk Score and Plan: 2 and Ondansetron and Dexamethasone  Airway Management Planned: Oral ETT  Additional Equipment: None  Intra-op Plan:   Post-operative Plan: Extubation in OR  Informed Consent: I have reviewed the patients History and Physical, chart, labs and discussed the procedure including the risks, benefits and alternatives for the proposed anesthesia with the patient or authorized representative who has indicated his/her understanding and acceptance.       Plan Discussed with: Anesthesiologist  and CRNA  Anesthesia Plan Comments: (  )       Anesthesia Quick Evaluation

## 2020-07-01 ENCOUNTER — Ambulatory Visit (HOSPITAL_COMMUNITY): Payer: Medicaid Other

## 2020-07-01 ENCOUNTER — Ambulatory Visit (HOSPITAL_COMMUNITY): Payer: Medicaid Other | Admitting: Anesthesiology

## 2020-07-01 ENCOUNTER — Ambulatory Visit (HOSPITAL_COMMUNITY)
Admission: RE | Admit: 2020-07-01 | Discharge: 2020-07-01 | Disposition: A | Discharge status: Home / Self Care | Payer: Medicaid Other | Attending: Pulmonary Disease | Admitting: Pulmonary Disease

## 2020-07-01 ENCOUNTER — Encounter (HOSPITAL_COMMUNITY): Payer: Self-pay | Admitting: Pulmonary Disease

## 2020-07-01 ENCOUNTER — Encounter (HOSPITAL_COMMUNITY)
Admission: RE | Disposition: A | Discharge status: Home / Self Care | Payer: Self-pay | Source: Home / Self Care | Attending: Pulmonary Disease

## 2020-07-01 ENCOUNTER — Other Ambulatory Visit: Payer: Self-pay

## 2020-07-01 DIAGNOSIS — Z79899 Other long term (current) drug therapy: Secondary | ICD-10-CM | POA: Insufficient documentation

## 2020-07-01 DIAGNOSIS — R911 Solitary pulmonary nodule: Secondary | ICD-10-CM | POA: Diagnosis present

## 2020-07-01 DIAGNOSIS — E785 Hyperlipidemia, unspecified: Secondary | ICD-10-CM | POA: Diagnosis not present

## 2020-07-01 DIAGNOSIS — I10 Essential (primary) hypertension: Secondary | ICD-10-CM | POA: Insufficient documentation

## 2020-07-01 DIAGNOSIS — I714 Abdominal aortic aneurysm, without rupture: Secondary | ICD-10-CM | POA: Insufficient documentation

## 2020-07-01 DIAGNOSIS — Z20822 Contact with and (suspected) exposure to covid-19: Secondary | ICD-10-CM | POA: Diagnosis not present

## 2020-07-01 DIAGNOSIS — Z87820 Personal history of traumatic brain injury: Secondary | ICD-10-CM | POA: Diagnosis not present

## 2020-07-01 DIAGNOSIS — F17210 Nicotine dependence, cigarettes, uncomplicated: Secondary | ICD-10-CM | POA: Diagnosis not present

## 2020-07-01 DIAGNOSIS — I429 Cardiomyopathy, unspecified: Secondary | ICD-10-CM | POA: Diagnosis not present

## 2020-07-01 DIAGNOSIS — Z888 Allergy status to other drugs, medicaments and biological substances status: Secondary | ICD-10-CM | POA: Diagnosis not present

## 2020-07-01 DIAGNOSIS — Z9889 Other specified postprocedural states: Secondary | ICD-10-CM

## 2020-07-01 HISTORY — PX: VIDEO BRONCHOSCOPY WITH ENDOBRONCHIAL NAVIGATION: SHX6175

## 2020-07-01 HISTORY — PX: BRONCHIAL NEEDLE ASPIRATION BIOPSY: SHX5106

## 2020-07-01 HISTORY — PX: BRONCHIAL WASHINGS: SHX5105

## 2020-07-01 HISTORY — PX: BRONCHIAL BRUSHINGS: SHX5108

## 2020-07-01 LAB — CBC
HCT: 40.2 % (ref 39.0–52.0)
Hemoglobin: 13.5 g/dL (ref 13.0–17.0)
MCH: 31 pg (ref 26.0–34.0)
MCHC: 33.6 g/dL (ref 30.0–36.0)
MCV: 92.2 fL (ref 80.0–100.0)
Platelets: 204 10*3/uL (ref 150–400)
RBC: 4.36 MIL/uL (ref 4.22–5.81)
RDW: 12.2 % (ref 11.5–15.5)
WBC: 6 10*3/uL (ref 4.0–10.5)
nRBC: 0 % (ref 0.0–0.2)

## 2020-07-01 LAB — COMPREHENSIVE METABOLIC PANEL
ALT: 15 U/L (ref 0–44)
AST: 30 U/L (ref 15–41)
Albumin: 4.2 g/dL (ref 3.5–5.0)
Alkaline Phosphatase: 61 U/L (ref 38–126)
Anion gap: 11 (ref 5–15)
BUN: 19 mg/dL (ref 8–23)
CO2: 33 mmol/L — ABNORMAL HIGH (ref 22–32)
Calcium: 9.3 mg/dL (ref 8.9–10.3)
Chloride: 95 mmol/L — ABNORMAL LOW (ref 98–111)
Creatinine, Ser: 0.99 mg/dL (ref 0.61–1.24)
GFR, Estimated: >60 mL/min (ref >60)
Glucose, Bld: 109 mg/dL — ABNORMAL HIGH (ref 70–99)
Potassium: 4.2 mmol/L (ref 3.5–5.1)
Sodium: 139 mmol/L (ref 135–145)
Total Bilirubin: 0.5 mg/dL (ref 0.3–1.2)
Total Protein: 7.2 g/dL (ref 6.5–8.1)

## 2020-07-01 LAB — SARS CORONAVIRUS 2 BY RT PCR (HOSPITAL ORDER, PERFORMED IN ~~LOC~~ HOSPITAL LAB): SARS Coronavirus 2: NEGATIVE

## 2020-07-01 SURGERY — VIDEO BRONCHOSCOPY WITH ENDOBRONCHIAL NAVIGATION
Anesthesia: General | Laterality: Right

## 2020-07-01 MED ORDER — ACETAMINOPHEN 325 MG PO TABS: 325.0000 mg | ORAL_TABLET | ORAL | Status: DC | PRN

## 2020-07-01 MED ORDER — LIDOCAINE 2% (20 MG/ML) 5 ML SYRINGE
INTRAMUSCULAR | Status: DC | PRN
  Administered 2020-07-01: 60 mg via INTRAVENOUS

## 2020-07-01 MED ORDER — EPHEDRINE SULFATE-NACL 50-0.9 MG/10ML-% IV SOSY
PREFILLED_SYRINGE | INTRAVENOUS | Status: DC | PRN
  Administered 2020-07-01 (×2): 5 mg via INTRAVENOUS

## 2020-07-01 MED ORDER — ROCURONIUM BROMIDE 10 MG/ML (PF) SYRINGE
PREFILLED_SYRINGE | INTRAVENOUS | Status: DC | PRN
  Administered 2020-07-01: 50 mg via INTRAVENOUS

## 2020-07-01 MED ORDER — SUGAMMADEX SODIUM 200 MG/2ML IV SOLN
INTRAVENOUS | Status: DC | PRN
  Administered 2020-07-01: 100 mg via INTRAVENOUS

## 2020-07-01 MED ORDER — ACETAMINOPHEN 160 MG/5ML PO SOLN: 325.0000 mg | ORAL | Status: DC | PRN

## 2020-07-01 MED ORDER — FENTANYL CITRATE (PF) 100 MCG/2ML IJ SOLN: 25.0000 ug | INTRAMUSCULAR | Status: DC | PRN

## 2020-07-01 MED ORDER — ONDANSETRON HCL 4 MG/2ML IJ SOLN: 4.0000 mg | Freq: Once | INTRAMUSCULAR | Status: DC | PRN

## 2020-07-01 MED ORDER — DEXAMETHASONE SODIUM PHOSPHATE 10 MG/ML IJ SOLN
INTRAMUSCULAR | Status: DC | PRN
  Administered 2020-07-01: 5 mg via INTRAVENOUS

## 2020-07-01 MED ORDER — ONDANSETRON HCL 4 MG/2ML IJ SOLN
INTRAMUSCULAR | Status: DC | PRN
  Administered 2020-07-01: 4 mg via INTRAVENOUS

## 2020-07-01 MED ORDER — LACTATED RINGERS IV SOLN: INTRAVENOUS | Status: DC

## 2020-07-01 MED ORDER — OXYCODONE HCL 5 MG PO TABS: 5.0000 mg | ORAL_TABLET | Freq: Once | ORAL | Status: DC | PRN

## 2020-07-01 MED ORDER — MEPERIDINE HCL 100 MG/ML IJ SOLN: 6.2500 mg | INTRAMUSCULAR | Status: DC | PRN

## 2020-07-01 MED ORDER — FENTANYL CITRATE (PF) 250 MCG/5ML IJ SOLN
INTRAMUSCULAR | Status: DC | PRN
  Administered 2020-07-01 (×2): 50 ug via INTRAVENOUS

## 2020-07-01 MED ORDER — PROPOFOL 10 MG/ML IV BOLUS
INTRAVENOUS | Status: DC | PRN
  Administered 2020-07-01: 150 mg via INTRAVENOUS

## 2020-07-01 MED ORDER — OXYCODONE HCL 5 MG/5ML PO SOLN: 5.0000 mg | Freq: Once | ORAL | Status: DC | PRN

## 2020-07-01 MED ORDER — CHLORHEXIDINE GLUCONATE 0.12 % MT SOLN
15.0000 mL | Freq: Once | OROMUCOSAL | Status: AC
  Administered 2020-07-01: 15 mL via OROMUCOSAL
  Filled 2020-07-01: qty 15

## 2020-07-01 SURGICAL SUPPLY — 45 items
ADAPTER BRONCH F/PENTAX (ADAPTER) ×3 IMPLANT
ADAPTER VALVE BIOPSY EBUS (MISCELLANEOUS) IMPLANT
ADPTR VALVE BIOPSY EBUS (MISCELLANEOUS)
BRUSH CYTOL CELLEBRITY 1.5X140 (MISCELLANEOUS) ×3 IMPLANT
BRUSH SUPERTRAX BIOPSY (INSTRUMENTS) IMPLANT
BRUSH SUPERTRAX NDL-TIP CYTO (INSTRUMENTS) ×3 IMPLANT
CANISTER SUCT 3000ML PPV (MISCELLANEOUS) ×3 IMPLANT
CHANNEL WORK EXTEND EDGE 180 (KITS) IMPLANT
CHANNEL WORK EXTEND EDGE 45 (KITS) IMPLANT
CHANNEL WORK EXTEND EDGE 90 (KITS) IMPLANT
CONT SPEC 4OZ CLIKSEAL STRL BL (MISCELLANEOUS) ×3 IMPLANT
COVER BACK TABLE 60X90IN (DRAPES) ×3 IMPLANT
FILTER STRAW FLUID ASPIR (MISCELLANEOUS) IMPLANT
FORCEPS BIOP SUPERTRX PREMAR (INSTRUMENTS) ×3 IMPLANT
GAUZE SPONGE 4X4 12PLY STRL (GAUZE/BANDAGES/DRESSINGS) ×3 IMPLANT
GLOVE SURG SS PI 7.5 STRL IVOR (GLOVE) ×6 IMPLANT
GOWN STRL REUS W/ TWL LRG LVL3 (GOWN DISPOSABLE) ×4 IMPLANT
GOWN STRL REUS W/TWL LRG LVL3 (GOWN DISPOSABLE) ×6
KIT CLEAN ENDO COMPLIANCE (KITS) ×3 IMPLANT
KIT LOCATABLE GUIDE (CANNULA) IMPLANT
KIT MARKER FIDUCIAL DELIVERY (KITS) IMPLANT
KIT PROCEDURE EDGE 180 (KITS) IMPLANT
KIT PROCEDURE EDGE 45 (KITS) IMPLANT
KIT PROCEDURE EDGE 90 (KITS) IMPLANT
KIT TURNOVER KIT B (KITS) ×3 IMPLANT
MARKER SKIN DUAL TIP RULER LAB (MISCELLANEOUS) ×3 IMPLANT
NEEDLE SUPERTRX PREMARK BIOPSY (NEEDLE) ×3 IMPLANT
NS IRRIG 1000ML POUR BTL (IV SOLUTION) ×3 IMPLANT
OIL SILICONE PENTAX (PARTS (SERVICE/REPAIRS)) ×3 IMPLANT
PAD ARMBOARD 7.5X6 YLW CONV (MISCELLANEOUS) ×6 IMPLANT
PATCHES PATIENT (LABEL) ×9 IMPLANT
SOL ANTI FOG 6CC (MISCELLANEOUS) ×2 IMPLANT
SOLUTION ANTI FOG 6CC (MISCELLANEOUS) ×1
SYR 20CC LL (SYRINGE) ×3 IMPLANT
SYR 20ML ECCENTRIC (SYRINGE) ×3 IMPLANT
SYR 50ML SLIP (SYRINGE) ×3 IMPLANT
TOWEL OR 17X24 6PK STRL BLUE (TOWEL DISPOSABLE) ×3 IMPLANT
TRAP SPECIMEN MUCOUS 40CC (MISCELLANEOUS) IMPLANT
TUBE CONNECTING 20X1/4 (TUBING) ×3 IMPLANT
UNDERPAD 30X30 (UNDERPADS AND DIAPERS) ×3 IMPLANT
VALVE BIOPSY  SINGLE USE (MISCELLANEOUS) ×3
VALVE BIOPSY SINGLE USE (MISCELLANEOUS) ×2 IMPLANT
VALVE SUCTION BRONCHIO DISP (MISCELLANEOUS) ×3 IMPLANT
WATER STERILE IRR 1000ML POUR (IV SOLUTION) ×3 IMPLANT
fiducial marker ×3 IMPLANT

## 2020-07-01 NOTE — Op Note (Signed)
Video Bronchoscopy with Electromagnetic Navigation Procedure Note  Date of Operation: 07/01/2020  Pre-op Diagnosis: Right upper lobe lung nodule  Post-op Diagnosis: Right upper lobe lung nodule  Surgeon: Garner Nash, DO   Assistants: None   Anesthesia: General endotracheal anesthesia  Operation: Flexible video fiberoptic bronchoscopy with electromagnetic navigation and biopsies.  Estimated Blood Loss: Minimal  Complications: None  Indications and History: [name] is a 65 y.o. male with right upper lobe lung nodule.  The risks, benefits, complications, treatment options and expected outcomes were discussed with the patient.  The possibilities of pneumothorax, pneumonia, reaction to medication, pulmonary aspiration, perforation of a viscus, bleeding, failure to diagnose a condition and creating a complication requiring transfusion or operation were discussed with the patient who freely signed the consent.    Description of Procedure: The patient was seen in the Preoperative Area, was examined and was deemed appropriate to proceed.  The patient was taken to [facility] endoscopy room 2, identified as [name] and the procedure verified as Flexible Video Fiberoptic Bronchoscopy.  A Time Out was held and the above information confirmed.   Prior to the date of the procedure a high-resolution CT scan of the chest was performed. Utilizing [place] a virtual tracheobronchial tree was generated to allow the creation of distinct navigation pathways to the patient's parenchymal abnormalities. After being taken to the operating room general anesthesia was initiated and the patient  was orally intubated. The video fiberoptic bronchoscope was introduced via the endotracheal tube and a general inspection was performed which showed normal right and left lung anatomy no evidence of endobronchial lesion.   Target #1 right upper lobe: The extendable working channel and locator  guide were introduced into the bronchoscope. The distinct navigation pathways prepared prior to this procedure were then utilized to navigate to within 0.8 cm of patient's lesion(s) identified on CT scan.  Full fluoroscopic sweep was obtained from RAO 25 degrees to LAO 25 degrees with inspiratory breath-hold at 20 cm of water. The extendable working channel was secured into place and the locator guide was withdrawn. Under fluoroscopic guidance transbronchial needle brushings, transbronchial Wang needle biopsies, and transbronchial forceps biopsies were performed to be sent for cytology and pathology.  Using the fiducial guide catheter 1 solitary fiducial was placed within the proximity of the lesion to help mark the location of biopsies.  If lesion was to grow over time could consider empiric radiation treatments. A bronchioalveolar lavage was performed in the right upper lobe and sent for cytology and microbiology (bacterial, fungal, AFB smears and cultures).   At the end of the procedure a general airway inspection was performed and there was no evidence of active bleeding.  Standard therapeutic bronchoscope was used for aspiration of the bilateral mainstem's remove any remaining blood clots or secretions.  There is no evidence of mucous plugging and all distal subsegments were patent at the termination of procedure.  Bronchoscope was brought to just above the main carina there is no evidence of active bleeding.  The bronchoscope was removed.  The patient tolerated the procedure well. There was no significant blood loss and there were no obvious complications. A post-procedural chest x-ray is pending.  Samples target #1 right upper lobe: 1. Transbronchial needle brushings from right upper lobe 2. Transbronchial Wang needle biopsies from right upper lobe 3. Transbronchial forceps biopsies from right upper lobe 4. Bronchoalveolar lavage from right upper lobe  Plans:  The patient will be discharged from  the PACU  to home when recovered from anesthesia and after chest x-ray is reviewed. We will review the cytology, pathology and microbiology results with the patient when they become available. Outpatient followup will be with Garner Nash, DO.   Garner Nash, DO [facility] 07/01/2020 11:31 AM

## 2020-07-01 NOTE — Discharge Instructions (Signed)
Flexible Bronchoscopy, Care After This sheet gives you information about how to care for yourself after your test. Your doctor may also give you more specific instructions. If you have problems or questions, contact your doctor. Follow these instructions at home: Eating and drinking  The day after the test, go back to your normal diet. Driving  Do not drive for 24 hours if you were given a medicine to help you relax (sedative).  Do not drive or use heavy machinery while taking prescription pain medicine. General instructions   Take over-the-counter and prescription medicines only as told by your doctor.  Return to your normal activities as told. Ask what activities are safe for you.  Do not use any products that have nicotine or tobacco in them. This includes cigarettes and e-cigarettes. If you need help quitting, ask your doctor.  Keep all follow-up visits as told by your doctor. This is important. It is very important if you had a tissue sample (biopsy) taken. Get help right away if:  You have shortness of breath that gets worse.  You get light-headed.  You feel like you are going to pass out (faint).  You have chest pain.  You cough up: ? More than a little blood. ? More blood than before. Summary  Do not eat or drink anything (not even water) for 2 hours after your test, or until your numbing medicine wears off.  Do not use cigarettes. Do not use e-cigarettes.  Get help right away if you have chest pain. This information is not intended to replace advice given to you by your health care provider. Make sure you discuss any questions you have with your health care provider. Document Revised: 05/24/2017 Document Reviewed: 06/29/2016 Elsevier Patient Education  2020 [employer].

## 2020-07-01 NOTE — Progress Notes (Signed)
Contacted Cytology for update on patients COVID test

## 2020-07-01 NOTE — Anesthesia Postprocedure Evaluation (Signed)
Anesthesia Post Note  Patient: [name]  Procedure(s) Performed: VIDEO BRONCHOSCOPY WITH ENDOBRONCHIAL NAVIGATION (Right ) BRONCHIAL BRUSHINGS BRONCHIAL NEEDLE ASPIRATION BIOPSIES BRONCHIAL WASHINGS     Patient location during evaluation: PACU Anesthesia Type: General Level of consciousness: awake and alert Pain management: pain level controlled Vital Signs Assessment: post-procedure vital signs reviewed and stable Respiratory status: spontaneous breathing, nonlabored ventilation, respiratory function stable and patient connected to nasal cannula oxygen Cardiovascular status: blood pressure returned to baseline and stable Postop Assessment: no apparent nausea or vomiting Anesthetic complications: no   No complications documented.  Last Vitals:  Vitals:   07/01/20 1155 07/01/20 1210  BP: 135/72 (!) 151/70  Pulse: 74 65  Resp: 12 15  Temp:    SpO2: 93% 95%    Last Pain:  Vitals:   07/01/20 1210  TempSrc:   PainSc: 0-No pain                 Liela Rylee

## 2020-07-01 NOTE — Interval H&P Note (Signed)
History and Physical Interval Note:  07/01/2020 7:07 AM  [name]  has presented today for surgery, with the diagnosis of RIGHT UPPER LOBE LUNG NODULE.  The various methods of treatment have been discussed with the patient and family. After consideration of risks, benefits and other options for treatment, the patient has consented to  Procedure(s): [place] (Right) as a surgical intervention.  The patient's history has been reviewed, patient examined, no change in status, stable for surgery.  I have reviewed the patient's chart and labs.  Questions were answered to the patient's satisfaction.     [place]

## 2020-07-01 NOTE — Transfer of Care (Signed)
Immediate Anesthesia Transfer of Care Note  Patient: [name]  Procedure(s) Performed: VIDEO BRONCHOSCOPY WITH ENDOBRONCHIAL NAVIGATION (Right ) BRONCHIAL BRUSHINGS BRONCHIAL NEEDLE ASPIRATION BIOPSIES BRONCHIAL WASHINGS  Patient Location: PACU  Anesthesia Type:General  Level of Consciousness: drowsy and patient cooperative  Airway & Oxygen Therapy: Patient Spontanous Breathing  Post-op Assessment: Report given to RN and Post -op Vital signs reviewed and stable  Post vital signs: Reviewed and stable  Last Vitals:  Vitals Value Taken Time  BP 163/75 07/01/20 1139  Temp    Pulse 73 07/01/20 1140  Resp 13 07/01/20 1140  SpO2 97 % 07/01/20 1140  Vitals shown include unvalidated device data.  Last Pain:  Vitals:   07/01/20 0700  TempSrc:   PainSc: 0-No pain         Complications: No complications documented.

## 2020-07-01 NOTE — Anesthesia Procedure Notes (Addendum)
Procedure Name: Intubation Date/Time: 07/01/2020 10:33 AM Performed by: Thelma Comp, CRNA Pre-anesthesia Checklist: Patient identified, Emergency Drugs available, Suction available and Patient being monitored Patient Re-evaluated:Patient Re-evaluated prior to induction Oxygen Delivery Method: Circle System Utilized Preoxygenation: Pre-oxygenation with 100% oxygen Induction Type: IV induction Ventilation: Mask ventilation without difficulty Laryngoscope Size: Mac and 4 Grade View: Grade I Tube type: Oral Tube size: 8.5 mm Number of attempts: 1 Airway Equipment and Method: Stylet Placement Confirmation: ETT inserted through vocal cords under direct vision,  positive ETCO2 and breath sounds checked- equal and bilateral Secured at: 22 cm Tube secured with: Tape Dental Injury: Teeth and Oropharynx as per pre-operative assessment

## 2020-07-02 ENCOUNTER — Encounter (HOSPITAL_COMMUNITY): Payer: Self-pay | Admitting: Pulmonary Disease

## 2020-07-02 LAB — ACID FAST SMEAR (AFB, MYCOBACTERIA): Acid Fast Smear: NEGATIVE

## 2020-07-03 LAB — CULTURE, BAL-QUANTITATIVE W GRAM STAIN: Culture: 40000 — AB

## 2020-07-06 LAB — AEROBIC/ANAEROBIC CULTURE W GRAM STAIN (SURGICAL/DEEP WOUND): Gram Stain: NONE SEEN

## 2020-07-06 LAB — CYTOLOGY - NON PAP

## 2020-07-07 ENCOUNTER — Telehealth: Payer: Self-pay | Admitting: Pulmonary Disease

## 2020-07-07 NOTE — Telephone Encounter (Signed)
Spoke with pt's sister per DPR, she is asking for results from pt's bronch. Informed her Dr. Valeta Harms was not in clinic today but I would pass this message along to him. Pt's sister stated understanding. Dr. Valeta Harms please advise on results from pt's bronch.

## 2020-07-08 NOTE — Telephone Encounter (Signed)
PCCM:  I called and spoke with patients daughter. Please keep planned office follow up  I discussed bronch results    Garner Nash, DO [facility] 07/08/2020 9:46 AM

## 2020-07-25 ENCOUNTER — Ambulatory Visit: Payer: Medicaid Other | Admitting: Pulmonary Disease

## 2020-07-28 ENCOUNTER — Ambulatory Visit: Payer: Medicaid Other | Admitting: Pulmonary Disease

## 2020-07-28 LAB — CULTURE, FUNGUS WITHOUT SMEAR

## 2020-08-08 ENCOUNTER — Ambulatory Visit (INDEPENDENT_AMBULATORY_CARE_PROVIDER_SITE_OTHER): Payer: Medicaid Other | Admitting: Pulmonary Disease

## 2020-08-08 ENCOUNTER — Other Ambulatory Visit: Payer: Self-pay

## 2020-08-08 ENCOUNTER — Encounter: Payer: Self-pay | Admitting: Pulmonary Disease

## 2020-08-08 VITALS — BP 122/64 | HR 62 | Temp 97.3°F | Wt 106.0 lb

## 2020-08-08 DIAGNOSIS — R911 Solitary pulmonary nodule: Secondary | ICD-10-CM | POA: Diagnosis not present

## 2020-08-08 DIAGNOSIS — F79 Unspecified intellectual disabilities: Secondary | ICD-10-CM | POA: Diagnosis not present

## 2020-08-08 DIAGNOSIS — F17200 Nicotine dependence, unspecified, uncomplicated: Secondary | ICD-10-CM | POA: Diagnosis not present
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
## 2020-08-08 NOTE — Patient Instructions (Signed)
Thank you for visiting Dr. Valeta Harms at [facility]. Today we recommend the following:  Orders Placed This Encounter  Procedures  . CT CHEST WO CONTRAST   Repeat CT Chest in 12 months, Jan 2023  Return in about 11 months (around 07/08/2021) for with APP or Dr. Valeta Harms.    Please do your part to reduce the spread of COVID-19.    You must quit smoking or vaping. This is the single most important thing that you can do to improve your lung health.   S = Set a quit date. T = Tell family, friends, and the people around you that you plan to quit. A = Anticipate or plan ahead for the tough times you'll face while quitting. R = Remove cigarettes and other tobacco products from your home, car, and work T = Talk to [place] about getting help to quit  If you need help feel free to reach out to our office, [place] [facility]: [phone], call 1-800-QUIT-NOW, or visit [email or web address]

## 2020-08-15 ENCOUNTER — Ambulatory Visit: Payer: Medicaid Other | Admitting: Pulmonary Disease

## 2020-08-16 LAB — ACID FAST CULTURE WITH REFLEXED SENSITIVITIES (MYCOBACTERIA): Acid Fast Culture: NEGATIVE

## 2020-11-17 ENCOUNTER — Other Ambulatory Visit: Payer: Self-pay

## 2020-11-17 ENCOUNTER — Ambulatory Visit (INDEPENDENT_AMBULATORY_CARE_PROVIDER_SITE_OTHER): Payer: Medicaid Other | Admitting: Podiatry

## 2020-11-17 ENCOUNTER — Encounter: Payer: Self-pay | Admitting: Podiatry

## 2020-11-17 ENCOUNTER — Ambulatory Visit: Payer: Medicaid Other

## 2020-11-17 DIAGNOSIS — B351 Tinea unguium: Secondary | ICD-10-CM | POA: Diagnosis not present

## 2020-11-17 DIAGNOSIS — R0989 Other specified symptoms and signs involving the circulatory and respiratory systems: Secondary | ICD-10-CM | POA: Diagnosis not present

## 2020-11-17 DIAGNOSIS — M778 Other enthesopathies, not elsewhere classified: Secondary | ICD-10-CM

## 2020-11-17 DIAGNOSIS — M79676 Pain in unspecified toe(s): Secondary | ICD-10-CM

## 2020-11-17 NOTE — Patient Instructions (Signed)
Try one of these moisturizers for the dry skin on your feet- apply to feet BID  1) Cetaphil  2) CeraVe  3) Eucerin  4) Aquaphor

## 2020-11-18 ENCOUNTER — Ambulatory Visit (HOSPITAL_COMMUNITY)
Admission: RE | Admit: 2020-11-18 | Discharge: 2020-11-18 | Disposition: A | Discharge status: Home / Self Care | Payer: Medicaid Other | Source: Ambulatory Visit | Attending: Podiatry | Admitting: Podiatry

## 2020-11-18 DIAGNOSIS — R0989 Other specified symptoms and signs involving the circulatory and respiratory systems: Secondary | ICD-10-CM | POA: Diagnosis present
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
## 2020-11-23 ENCOUNTER — Telehealth: Payer: Self-pay | Admitting: *Deleted

## 2020-11-23 ENCOUNTER — Ambulatory Visit: Payer: Medicaid Other | Admitting: Podiatry

## 2020-11-23 ENCOUNTER — Telehealth: Payer: Self-pay | Admitting: Podiatry

## 2020-11-23 DIAGNOSIS — R0989 Other specified symptoms and signs involving the circulatory and respiratory systems: Secondary | ICD-10-CM

## 2020-11-23 NOTE — Telephone Encounter (Signed)
Did you send him for the ABIs requested?  I don't fully understand the message since I don't see any results in the chart that we ordered anything.  So make sure he got or gets his ABIs please.

## 2020-11-23 NOTE — Telephone Encounter (Signed)
-----   Message from Garrel Ridgel, [place] sent at 11/23/2020  3:58 PM EDT ----- This just popped back up in results.  I had previously asked for the referral to go back to vascular for tx because of severe disease.

## 2020-11-23 NOTE — Telephone Encounter (Signed)
Placed orders for vascular consult

## 2020-11-23 NOTE — Telephone Encounter (Signed)
Patient states that the vein/vascular center needs a referral from Dr. Milinda Pointer so patient can follow up from previous vascular test. Please advise

## 2020-11-24 ENCOUNTER — Telehealth: Payer: Self-pay | Admitting: Podiatry

## 2020-11-24 NOTE — Telephone Encounter (Signed)
Spoke with [facility]in and Vascular to get an update on his referral. They stated their referral coordinator will be there tomorrow and he should expect to hear something from them tomorrow or next week.

## 2020-11-24 NOTE — Telephone Encounter (Signed)
Order was placed and [facility] is ready to schedule that referral.

## 2020-12-19 ENCOUNTER — Ambulatory Visit (INDEPENDENT_AMBULATORY_CARE_PROVIDER_SITE_OTHER): Payer: Medicaid Other | Admitting: Surgery

## 2020-12-19 ENCOUNTER — Encounter: Payer: Self-pay | Admitting: Surgery

## 2020-12-19 ENCOUNTER — Other Ambulatory Visit: Payer: Self-pay

## 2020-12-19 VITALS — BP 143/79 | HR 65 | Temp 98.2°F | Resp 20 | Ht 69.0 in | Wt 99.0 lb

## 2020-12-19 DIAGNOSIS — I712 Thoracic aortic aneurysm, without rupture, unspecified: Secondary | ICD-10-CM

## 2020-12-19 NOTE — Progress Notes (Signed)
[facility] [place]  Patient name: [name] MRN: [id number] DOB: 1955/08/28 Sex: male   REQUESTING PROVIDER:    Dr. Milinda Pointer   REASON FOR CONSULT:   PAD  HISTORY OF PRESENT ILLNESS:   [name] is a 65 y.o. male, who I follow for a descending thoracic aortic aneurysm which was repaired on 06/12/2015.  He has a history of ischemic cardiomyopathy and is a smoker.  He is medically managed for hypertension, and takes a statin for hypercholesterolemia.  He was seen by Dr. Milinda Pointer who was concerned about his bloodflow and so he was referred back to me.  The patient states that he does very little walking at his facility.  He is having issues with balance.  He does not endorse rest pain or any open wounds.  PAST MEDICAL HISTORY    Past Medical History:  Diagnosis Date   AAA (abdominal aortic aneurysm) ([place])    Alcohol abuse    h/o, quit april 5th, 2013   Anxiety    Aortic aneurysm ([place])    Blood transfusion    Cardiomegaly    EF 25-30 % in 2005, EF 50-55% Oct 2013   Depression    Hyperlipidemia    Hypertension    Lumbar herniated disc    Memory deficits    Radiculopathy    Seizures ([place])    last seizure several years ago- per sister 47 or below at [facility] 07-12-17   TBI (traumatic brain injury) [facility])    Sept 1988   Urinary urgency      FAMILY HISTORY   Family History  Problem Relation Age of Onset   Liver disease Mother    Hypertension Mother    Breast cancer Maternal Aunt    Esophageal cancer Cousin    Esophageal cancer Maternal Uncle    Colon cancer Neg Hx    Stomach cancer Neg Hx    Colon polyps Neg Hx    Rectal cancer Neg Hx     SOCIAL HISTORY:   Social History   Socioeconomic History   Marital status: Single    Spouse name: Not on file   Number of children: Not on file   Years of education: Not on file   Highest education level: Not on file  Occupational History   Not on file   Tobacco Use   Smoking status: Some Days    Packs/day: 0.10    Pack years: 0.00    Types: Cigarettes   Smokeless tobacco: Never   Tobacco comments:    "as much as he can"- per Med Tech at [facility] 07/01/19  Vaping Use   Vaping Use: Never used  Substance and Sexual Activity   Alcohol use: No    Alcohol/week: 0.0 standard drinks    Comment: per sister, last drink was apr 5th--2013 hx of ETOH   Drug use: No   Sexual activity: Not on file  Other Topics Concern   Not on file  Social History Narrative   Not on file   Social Determinants of Health   Financial Resource Strain: Not on file  Food Insecurity: Not on file  Transportation Needs: Not on file  Physical Activity: Not on file  Stress: Not on file  Social Connections: Not on file  Intimate Partner Violence: Not on file    ALLERGIES:    Allergies  Allergen Reactions   Isovue [Iopamidol] Hives    Pt broke out in one hive on his chest  after contrast on 01/03/15.  Pt will need full premeds in the future per Dr Martinique.      CURRENT MEDICATIONS:    Current Outpatient Medications  Medication Sig Dispense Refill   acetaminophen (TYLENOL) 500 MG tablet Take 1,000 mg by mouth daily.     atenolol-chlorthalidone (TENORETIC) 50-25 MG per tablet Take 1 tablet by mouth daily. (Patient taking differently: Take 0.5 tablets by mouth daily.) 30 tablet 5   busPIRone (BUSPAR) 10 MG tablet Take 10 mg by mouth 3 (three) times daily.     Ergocalciferol (VITAMIN D2) 10 MCG (400 UNIT) TABS Take 800 Units by mouth daily.     mirtazapine (REMERON) 15 MG tablet Take 15 mg by mouth at bedtime.     Multiple Vitamins-Minerals (CERTAVITE/ANTIOXIDANTS) TABS Take 1 tablet by mouth daily.     Nutritional Supplement LIQD Take 1 Bottle by mouth 2 (two) times daily. Mighty Shake     phenytoin (DILANTIN) 100 MG ER capsule Take 200 mg by mouth at bedtime.      phenytoin (DILANTIN) 30 MG ER capsule Take 60 mg by mouth at bedtime.      pravastatin  (PRAVACHOL) 40 MG tablet Take 1 tablet (40 mg total) by mouth every evening. For high cholesterol (Patient taking differently: Take 40 mg by mouth at bedtime. For high cholesterol) 30 tablet 11   Current Facility-Administered Medications  Medication Dose Route Frequency Provider Last Rate Last Admin   0.9 %  sodium chloride infusion  500 mL Intravenous Once Pyrtle, Lajuan Lines, MD        REVIEW OF SYSTEMS:   [X]  denotes positive finding, [ ]  denotes negative finding Cardiac  Comments:  Chest pain or chest pressure:    Shortness of breath upon exertion:    Short of breath when lying flat:    Irregular heart rhythm:        Vascular    Pain in calf, thigh, or hip brought on by ambulation:    Pain in feet at night that wakes you up from your sleep:     Blood clot in your veins:    Leg swelling:         Pulmonary    Oxygen at home:    Productive cough:     Wheezing:         Neurologic    Sudden weakness in arms or legs:     Sudden numbness in arms or legs:     Sudden onset of difficulty speaking or slurred speech:    Temporary loss of vision in one eye:     Problems with dizziness:         Gastrointestinal    Blood in stool:      Vomited blood:         Genitourinary    Burning when urinating:     Blood in urine:        Psychiatric    Major depression:         Hematologic    Bleeding problems:    Problems with blood clotting too easily:        Skin    Rashes or ulcers:        Constitutional    Fever or chills:     PHYSICAL EXAM:   Vitals:   12/19/20 1203  BP: (!) 143/79  Pulse: 65  Resp: 20  Temp: 98.2 F (36.8 C)  SpO2: 96%  Weight: 99 lb (44.9 kg)  Height: 5\' 9"  (1.753  m)    GENERAL: The patient is a well-nourished male, in no acute distress. The vital signs are documented above. CARDIAC: There is a regular rate and rhythm.  VASCULAR: Nonpalpable pedal pulses PULMONARY: Nonlabored respirations MUSCULOSKELETAL: There are no major deformities or  cyanosis. NEUROLOGIC: No focal weakness or paresthesias are detected. SKIN: There are no ulcers or rashes noted. PSYCHIATRIC: The patient has a normal affect.  STUDIES:   I have reviewed the following: +-------+-----------+-----------+-----------------+------------+  ABI/TBIToday's ABIToday's TBIPrevious ABI     Previous TBI  +-------+-----------+-----------+-----------------+------------+  Right  0.44       0.22       0.85             0.45          +-------+-----------+-----------+-----------------+------------+  Left   0.83       0.53       0.93 (unreliable)0.71          +-------+-----------+-----------+-----------------+------------+   Left toe pressure: 83 Right toe pressure: 34 ASSESSMENT and PLAN   TAAA: Patient will keep his regular scheduled follow-up  Lower extremity PAD: The patient has had a significant decline in the blood flow to his right leg.  He does not endorse any claudication symptoms.  He does not have open wounds or rest pain.  I have encouraged him to try and ambulate as much as he can over the next several months and see how much his legs bother him.  I also told him that if he develops a wound or a change in his symptoms that he would need to contact me.  I will bring him back in 3 months to see how he is doing with his walking and see how much his leg bothers him.  I would be very reluctant to proceed with angiography, unless he clearly demonstrates a need due to his symptoms.   Leia Alf, MD, FACS [facility] [phone] Pager [phone]

## 2021-02-20 ENCOUNTER — Other Ambulatory Visit: Payer: Self-pay

## 2021-02-20 ENCOUNTER — Ambulatory Visit (INDEPENDENT_AMBULATORY_CARE_PROVIDER_SITE_OTHER): Payer: Medicaid Other | Admitting: Physician Assistant

## 2021-02-20 VITALS — BP 143/74 | HR 72 | Temp 97.6°F | Resp 20 | Ht 69.0 in | Wt 94.0 lb

## 2021-02-20 DIAGNOSIS — I739 Peripheral vascular disease, unspecified: Secondary | ICD-10-CM

## 2021-02-20 MED ORDER — DIPHENHYDRAMINE HCL 50 MG PO CAPS: 50.0000 mg | ORAL_CAPSULE | Freq: Once | ORAL | 0 refills | Status: DC

## 2021-02-20 MED ORDER — PREDNISONE 10 MG PO TABS: 10.0000 mg | ORAL_TABLET | Freq: Once | ORAL | 0 refills | Status: AC

## 2021-02-20 MED ORDER — FAMOTIDINE 20 MG PO TABS: 20.0000 mg | ORAL_TABLET | Freq: Every day | ORAL | 0 refills | Status: DC
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
## 2021-02-21 ENCOUNTER — Ambulatory Visit (INDEPENDENT_AMBULATORY_CARE_PROVIDER_SITE_OTHER): Payer: Medicaid Other | Admitting: Podiatry

## 2021-02-21 ENCOUNTER — Encounter: Payer: Self-pay | Admitting: Podiatry

## 2021-02-21 DIAGNOSIS — M79676 Pain in unspecified toe(s): Secondary | ICD-10-CM

## 2021-02-21 DIAGNOSIS — B351 Tinea unguium: Secondary | ICD-10-CM

## 2021-02-21 NOTE — Progress Notes (Signed)
He presents today chief complaint of painful elongated toenails bilateral.  He has just been seen by vascular who states that he has limb threatening ischemia to the right lower extremity.  Objective: None palpable pulses bilateral.  Toenails are long thick yellow dystrophic clinical mycotic.  Preulcerative lesions of the first metatarsophalangeal joint of the right foot if we should not open yet.  Assessment: Peripheral vascular disease right pain limb secondary to onychomycosis.  Plan: Debridement of all reactive hyperkeratotic tissue debridement of toenails 1 through 5 bilateral follow-up with him in about 3 months but Dr.Galloway.

## 2021-02-28 ENCOUNTER — Other Ambulatory Visit: Payer: Self-pay

## 2021-02-28 ENCOUNTER — Encounter (HOSPITAL_COMMUNITY): Payer: Self-pay | Admitting: Surgery

## 2021-02-28 ENCOUNTER — Ambulatory Visit (HOSPITAL_COMMUNITY)
Admission: RE | Admit: 2021-02-28 | Discharge: 2021-02-28 | Disposition: A | Discharge status: Home / Self Care | Payer: Medicaid Other | Attending: Surgery | Admitting: Surgery

## 2021-02-28 ENCOUNTER — Encounter (HOSPITAL_COMMUNITY)
Admission: RE | Disposition: A | Discharge status: Home / Self Care | Payer: Self-pay | Source: Home / Self Care | Attending: Surgery

## 2021-02-28 DIAGNOSIS — I70202 Unspecified atherosclerosis of native arteries of extremities, left leg: Secondary | ICD-10-CM | POA: Diagnosis not present

## 2021-02-28 DIAGNOSIS — F17210 Nicotine dependence, cigarettes, uncomplicated: Secondary | ICD-10-CM | POA: Insufficient documentation

## 2021-02-28 DIAGNOSIS — E7800 Pure hypercholesterolemia, unspecified: Secondary | ICD-10-CM | POA: Diagnosis not present

## 2021-02-28 DIAGNOSIS — I255 Ischemic cardiomyopathy: Secondary | ICD-10-CM | POA: Diagnosis not present

## 2021-02-28 DIAGNOSIS — Z79899 Other long term (current) drug therapy: Secondary | ICD-10-CM | POA: Diagnosis not present

## 2021-02-28 DIAGNOSIS — L97519 Non-pressure chronic ulcer of other part of right foot with unspecified severity: Secondary | ICD-10-CM | POA: Diagnosis not present

## 2021-02-28 DIAGNOSIS — I70234 Atherosclerosis of native arteries of right leg with ulceration of heel and midfoot: Secondary | ICD-10-CM | POA: Diagnosis present

## 2021-02-28 DIAGNOSIS — I712 Thoracic aortic aneurysm, without rupture: Secondary | ICD-10-CM | POA: Diagnosis not present

## 2021-02-28 DIAGNOSIS — I10 Essential (primary) hypertension: Secondary | ICD-10-CM | POA: Diagnosis not present

## 2021-02-28 DIAGNOSIS — I70235 Atherosclerosis of native arteries of right leg with ulceration of other part of foot: Secondary | ICD-10-CM | POA: Diagnosis not present

## 2021-02-28 DIAGNOSIS — Z91041 Radiographic dye allergy status: Secondary | ICD-10-CM | POA: Insufficient documentation

## 2021-02-28 HISTORY — PX: PERIPHERAL VASCULAR INTERVENTION: CATH118257

## 2021-02-28 HISTORY — PX: ABDOMINAL AORTOGRAM W/LOWER EXTREMITY: CATH118223

## 2021-02-28 LAB — POCT I-STAT, CHEM 8
BUN: 20 mg/dL (ref 8–23)
Calcium, Ion: 1.2 mmol/L (ref 1.15–1.40)
Chloride: 94 mmol/L — ABNORMAL LOW (ref 98–111)
Creatinine, Ser: 1.1 mg/dL (ref 0.61–1.24)
Glucose, Bld: 96 mg/dL (ref 70–99)
HCT: 42 % (ref 39.0–52.0)
Hemoglobin: 14.3 g/dL (ref 13.0–17.0)
Potassium: 4 mmol/L (ref 3.5–5.1)
Sodium: 138 mmol/L (ref 135–145)
TCO2: 34 mmol/L — ABNORMAL HIGH (ref 22–32)

## 2021-02-28 SURGERY — ABDOMINAL AORTOGRAM W/LOWER EXTREMITY
Anesthesia: LOCAL | Laterality: Right

## 2021-02-28 MED ORDER — METHYLPREDNISOLONE SODIUM SUCC 125 MG IJ SOLR: 125.0000 mg | INTRAMUSCULAR | Status: AC

## 2021-02-28 MED ORDER — HEPARIN (PORCINE) IN NACL 1000-0.9 UT/500ML-% IV SOLN
INTRAVENOUS | Status: DC | PRN
  Administered 2021-02-28 (×2): 500 mL

## 2021-02-28 MED ORDER — LIDOCAINE HCL (PF) 1 % IJ SOLN
INTRAMUSCULAR | Status: DC | PRN
  Administered 2021-02-28: 8 mL

## 2021-02-28 MED ORDER — METHYLPREDNISOLONE SODIUM SUCC 125 MG IJ SOLR
INTRAMUSCULAR | Status: AC
  Administered 2021-02-28: 125 mg via INTRAVENOUS
  Filled 2021-02-28: qty 2

## 2021-02-28 MED ORDER — ASPIRIN EC 81 MG PO TBEC
81.0000 mg | DELAYED_RELEASE_TABLET | Freq: Every day | ORAL | Status: DC
  Administered 2021-02-28: 81 mg via ORAL
  Filled 2021-02-28: qty 1

## 2021-02-28 MED ORDER — LIDOCAINE HCL (PF) 1 % IJ SOLN
INTRAMUSCULAR | Status: AC
  Filled 2021-02-28: qty 30

## 2021-02-28 MED ORDER — ACETAMINOPHEN 325 MG PO TABS: 650.0000 mg | ORAL_TABLET | ORAL | Status: DC | PRN

## 2021-02-28 MED ORDER — HEPARIN (PORCINE) IN NACL 1000-0.9 UT/500ML-% IV SOLN
INTRAVENOUS | Status: AC
  Filled 2021-02-28: qty 1000

## 2021-02-28 MED ORDER — IODIXANOL 320 MG/ML IV SOLN
INTRAVENOUS | Status: DC | PRN
  Administered 2021-02-28: 160 mL

## 2021-02-28 MED ORDER — CLOPIDOGREL BISULFATE 75 MG PO TABS: 75.0000 mg | ORAL_TABLET | Freq: Every day | ORAL | 11 refills | Status: DC

## 2021-02-28 MED ORDER — DIPHENHYDRAMINE HCL 50 MG/ML IJ SOLN
INTRAMUSCULAR | Status: AC
  Administered 2021-02-28: 25 mg via INTRAVENOUS
  Filled 2021-02-28: qty 1

## 2021-02-28 MED ORDER — HEPARIN SODIUM (PORCINE) 1000 UNIT/ML IJ SOLN
INTRAMUSCULAR | Status: AC
  Filled 2021-02-28: qty 1

## 2021-02-28 MED ORDER — HEPARIN SODIUM (PORCINE) 1000 UNIT/ML IJ SOLN
INTRAMUSCULAR | Status: DC | PRN
  Administered 2021-02-28: 5000 [IU] via INTRAVENOUS

## 2021-02-28 MED ORDER — SODIUM CHLORIDE 0.9% FLUSH: 3.0000 mL | INTRAVENOUS | Status: DC | PRN

## 2021-02-28 MED ORDER — SODIUM CHLORIDE 0.9% FLUSH: 3.0000 mL | Freq: Two times a day (BID) | INTRAVENOUS | Status: DC

## 2021-02-28 MED ORDER — CLOPIDOGREL BISULFATE 75 MG PO TABS: 300.0000 mg | ORAL_TABLET | Freq: Once | ORAL | Status: DC

## 2021-02-28 MED ORDER — SODIUM CHLORIDE 0.9 % IV SOLN: INTRAVENOUS | Status: DC

## 2021-02-28 MED ORDER — ONDANSETRON HCL 4 MG/2ML IJ SOLN: 4.0000 mg | Freq: Four times a day (QID) | INTRAMUSCULAR | Status: DC | PRN

## 2021-02-28 MED ORDER — FAMOTIDINE 20 MG IN NS 100 ML IVPB
20.0000 mg | INTRAVENOUS | Status: AC
  Administered 2021-02-28: 20 mg via INTRAVENOUS
  Filled 2021-02-28: qty 100

## 2021-02-28 MED ORDER — MORPHINE SULFATE (PF) 2 MG/ML IV SOLN
2.0000 mg | INTRAVENOUS | Status: DC | PRN
Start: 2021-02-28 — End: 2021-02-28

## 2021-02-28 MED ORDER — MIDAZOLAM HCL 2 MG/2ML IJ SOLN
INTRAMUSCULAR | Status: AC
  Filled 2021-02-28: qty 2

## 2021-02-28 MED ORDER — FENTANYL CITRATE (PF) 100 MCG/2ML IJ SOLN
INTRAMUSCULAR | Status: AC
  Filled 2021-02-28: qty 2

## 2021-02-28 MED ORDER — OXYCODONE HCL 5 MG PO TABS: 5.0000 mg | ORAL_TABLET | ORAL | Status: DC | PRN

## 2021-02-28 MED ORDER — DIPHENHYDRAMINE HCL 50 MG/ML IJ SOLN: 25.0000 mg | INTRAMUSCULAR | Status: AC

## 2021-02-28 MED ORDER — HYDRALAZINE HCL 20 MG/ML IJ SOLN: 5.0000 mg | INTRAMUSCULAR | Status: DC | PRN

## 2021-02-28 MED ORDER — SODIUM CHLORIDE 0.9 % IV SOLN: 250.0000 mL | INTRAVENOUS | Status: DC | PRN

## 2021-02-28 MED ORDER — FENTANYL CITRATE (PF) 100 MCG/2ML IJ SOLN
INTRAMUSCULAR | Status: DC | PRN
  Administered 2021-02-28: 25 ug via INTRAVENOUS
  Administered 2021-02-28: 50 ug via INTRAVENOUS

## 2021-02-28 MED ORDER — CLOPIDOGREL BISULFATE 75 MG PO TABS: 75.0000 mg | ORAL_TABLET | Freq: Every day | ORAL | Status: DC

## 2021-02-28 MED ORDER — ASPIRIN EC 81 MG PO TBEC: 81.0000 mg | DELAYED_RELEASE_TABLET | Freq: Every day | ORAL | 2 refills | Status: DC

## 2021-02-28 MED ORDER — SODIUM CHLORIDE 0.9 % WEIGHT BASED INFUSION: 1.0000 mL/kg/h | INTRAVENOUS | Status: DC

## 2021-02-28 MED ORDER — MIDAZOLAM HCL 2 MG/2ML IJ SOLN
INTRAMUSCULAR | Status: DC | PRN
  Administered 2021-02-28: 1 mg via INTRAVENOUS

## 2021-02-28 MED ORDER — CLOPIDOGREL BISULFATE 300 MG PO TABS
ORAL_TABLET | ORAL | Status: DC | PRN
  Administered 2021-02-28: 300 mg via ORAL

## 2021-02-28 MED ORDER — LABETALOL HCL 5 MG/ML IV SOLN: 10.0000 mg | INTRAVENOUS | Status: DC | PRN

## 2021-02-28 MED ORDER — CLOPIDOGREL BISULFATE 300 MG PO TABS
ORAL_TABLET | ORAL | Status: AC
  Filled 2021-02-28: qty 1

## 2021-02-28 SURGICAL SUPPLY — 23 items
BALLN MUSTANG 5.0X40 135 (BALLOONS) ×3
BALLN MUSTANG 5X100X135 (BALLOONS) ×3
BALLOON MUSTANG 5.0X40 135 (BALLOONS) ×2 IMPLANT
BALLOON MUSTANG 5X100X135 (BALLOONS) ×2 IMPLANT
CATH OMNI FLUSH 5F 65CM (CATHETERS) ×3 IMPLANT
CATH QUICKCROSS SUPP .035X90CM (MICROCATHETER) ×3 IMPLANT
DEVICE TORQUE H2O (MISCELLANEOUS) ×3 IMPLANT
DEVICE VASC CLSR CELT ART 6 (Vascular Products) ×3 IMPLANT
DRAPE C-ARM 35X43 STRL (DRAPES) ×3 IMPLANT
GUIDEWIRE ANGLED .035X150CM (WIRE) ×3 IMPLANT
KIT ENCORE 26 ADVANTAGE (KITS) ×3 IMPLANT
KIT MICROPUNCTURE NIT STIFF (SHEATH) ×3 IMPLANT
KIT PV (KITS) ×3 IMPLANT
SHEATH PINNACLE 5F 10CM (SHEATH) ×3 IMPLANT
SHEATH PINNACLE 6F 10CM (SHEATH) ×3 IMPLANT
SHEATH PINNACLE ST 6F 45CM (SHEATH) ×3 IMPLANT
SHEATH PROBE COVER 6X72 (BAG) ×6 IMPLANT
STENT ELUVIA 6X150X130 (Permanent Stent) ×6 IMPLANT
SYR MEDRAD MARK V 150ML (SYRINGE) ×3 IMPLANT
TRANSDUCER W/STOPCOCK (MISCELLANEOUS) ×3 IMPLANT
TRAY PV CATH (CUSTOM PROCEDURE TRAY) ×3 IMPLANT
WIRE HI TORQ VERSACORE 300 (WIRE) ×3 IMPLANT
WIRE STARTER BENTSON 035X150 (WIRE) ×3 IMPLANT

## 2021-02-28 NOTE — Interval H&P Note (Signed)
History and Physical Interval Note:  02/28/2021 7:33 AM  [name]  has presented today for surgery, with the diagnosis of pad - foot wound.  The various methods of treatment have been discussed with the patient and family. After consideration of risks, benefits and other options for treatment, the patient has consented to  Procedure(s): ABDOMINAL AORTOGRAM W/LOWER EXTREMITY (N/A) as a surgical intervention.  The patient's history has been reviewed, patient examined, no change in status, stable for surgery.  I have reviewed the patient's chart and labs.  Questions were answered to the patient's satisfaction.     [name]

## 2021-02-28 NOTE — Discharge Instructions (Signed)
° °  [facility] [place] ° °Discharge Instructions ° °Lower Extremity Angiogram; Angioplasty/Stenting ° °Please refer to the following instructions for your post-procedure care. Your surgeon or physician assistant will discuss any changes with you. ° °Activity ° °Avoid lifting more than 8 pounds (1 gallons of milk) for 72 hours (3 days) after your procedure. You may walk as much as you can tolerate. It's OK to drive after 72 hours. ° °Bathing/Showering ° °You may shower the day after your procedure. If you have a bandage, you may remove it at 24- 48 hours. Clean your incision site with mild soap and water. Pat the area dry with a clean towel. ° °Diet ° °Resume your pre-procedure diet. There are no special food restrictions following this procedure. All patients with peripheral vascular disease should follow a low fat/low cholesterol diet. In order to heal from your surgery, it is CRITICAL to get adequate nutrition. Your body requires vitamins, minerals, and protein. Vegetables are the best source of vitamins and minerals. Vegetables also provide the perfect balance of protein. Processed food has little nutritional value, so try to avoid this. ° °Medications ° °Resume taking all of your medications unless your doctor tells you not to. If your incision is causing pain, you may take over-the-counter pain relievers such as acetaminophen (Tylenol) ° °Follow Up ° °Follow up will be arranged at the time of your procedure. You may have an office visit scheduled or may be scheduled for surgery. Ask your surgeon if you have any questions. ° °Please call us immediately for any of the following conditions: °•Severe or worsening pain your legs or feet at rest or with walking. °•Increased pain, redness, drainage at your groin puncture site. °•Fever of 101 degrees or higher. °•If you have any mild or slow bleeding from your puncture site: lie down, apply firm constant pressure over the area with a piece of  gauze or a clean wash cloth for 30 minutes- no peeking!, call 911 right away if you are still bleeding after 30 minutes, or if the bleeding is heavy and unmanageable. ° °Reduce your risk factors of vascular disease: ° °Stop smoking. If you would like help call [facility] at 1-800-QUIT-NOW (1-800-784-8669) or [facility] at [phone]. °Manage your cholesterol °Maintain a desired weight °Control your diabetes °Keep your blood pressure down ° °If you have any questions, please call the office at [phone] ° °

## 2021-02-28 NOTE — Op Note (Signed)
Patient name: [name] MRN: [id number] DOB: 1956-03-20 Sex: male  02/28/2021 Pre-operative Diagnosis: Right foot ulcer Post-operative diagnosis:  Same Surgeon:  [name] Procedure Performed:  1.  Ultrasound-guided access, left femoral artery  2.  Abdominal aortogram  3.  Bilateral lower extremity runoff  4.  Stent, right superficial femoral artery  5.  Conscious sedation, 82 minutes  6.  Closure device, Celt    Indications: Patient presented to clinic with a significant drop in his ABIs and a right foot ulcer.  He is scheduled for arteriogram  Procedure:  The patient was identified in the holding area and taken to room 8.  The patient was then placed supine on the table and prepped and draped in the usual sterile fashion.  A time out was called.  Conscious sedation was administered with the use of IV fentanyl and Versed under continuous physician and nurse monitoring.  Heart rate, blood pressure, and oxygen saturation were continuously monitored.  Total sedation time was 82 minutes.  Ultrasound was used to evaluate the left common femoral artery.  It was patent .  A digital ultrasound image was acquired.  A micropuncture needle was used to access the left common femoral artery under ultrasound guidance.  An 018 wire was advanced without resistance and a micropuncture sheath was placed.  The 018 wire was removed and a benson wire was placed.  The micropuncture sheath was exchanged for a 5 french sheath.  An omniflush catheter was advanced over the wire to the level of L-1.  An abdominal angiogram was obtained.  Next, using the omniflush catheter and a benson wire, the aortic bifurcation was crossed and the catheter was placed into theright external iliac artery and right runoff was obtained.  left runoff was performed via retrograde sheath injections.  Findings:   Aortogram: No significant renal artery stenosis.  The infrarenal abdominal aorta is widely patent.  Bilateral common  iliac arteries widely patent.  There is mild luminal narrowing at the proximal right external iliac artery approximately 40 to 50%.  The left external iliac artery is widely patent  Right Lower Extremity: The right common femoral artery is patent without stenosis.  The profunda is patent however there is diffuse disease throughout the distal branches.  The superficial femoral artery is occluded with reconstitution in the adductor canal.  There is diffuse tibial disease.  Left Lower Extremity: The left common femoral and profundofemoral artery are widely patent.  The superficial femoral artery is patent throughout its course without hemodynamically significant stenosis.  There is mobile atheromatous plaque within the above-knee popliteal artery.  There is occlusion of all 3 tibial vessels with reconstitution of the anterior tibial.  Intervention: After the above images were acquired the decision was made to proceed with intervention.  A 6 x 45 cm sheath was advanced over the aortic bifurcation into the right external iliac artery.  The patient was fully heparinized.  Next using a 035 Glidewire and a quick cross catheter, subintimal recanalization was performed with reentry into the native lumen within the adductor canal, confirmed by contrast injection.  A 035 wire was inserted.  The subintimal tract was predilated with a 5 mm balloon and then I placed 2 overlapping Elluvia 150 stents.  The deployment of the second stent migrated slightly into the distal common femoral artery.  The stent was then molded with a 5 mm balloon.  I tried to push the stent distally however it would not move.  I then  performed completion angiography which revealed widely patent superficial femoral artery with no change in runoff.  The groin was closed with a Celt device  Impression:  #1  Mild to moderate right external iliac artery stenosis  #2  Occluded right superficial femoral artery successfully recanalized and stented with  overlapping 6 mm Elluvia stents.  There was a slight proximal migration of the proximal stent with deployment into the distal common femoral artery  #3  Mobile atheromatous plaque within the left popliteal artery.  There is occlusion of all 3 left tibial vessels with reconstitution of the anterior tibial at the ankle.  #4  The patient will be started on aspirin and Plavix with follow-up in the office in 1 month, which he should stay on indefinitely     V. [name], M.D., [facility] [place] Office: [phone] Pager:  [phone]

## 2021-03-28 ENCOUNTER — Other Ambulatory Visit: Payer: Self-pay | Admitting: *Deleted

## 2021-03-28 DIAGNOSIS — I739 Peripheral vascular disease, unspecified: Secondary | ICD-10-CM

## 2021-04-10 ENCOUNTER — Ambulatory Visit (INDEPENDENT_AMBULATORY_CARE_PROVIDER_SITE_OTHER): Payer: Medicaid Other | Admitting: Physician Assistant

## 2021-04-10 ENCOUNTER — Ambulatory Visit (INDEPENDENT_AMBULATORY_CARE_PROVIDER_SITE_OTHER)
Admission: RE | Admit: 2021-04-10 | Discharge: 2021-04-10 | Disposition: A | Discharge status: Home / Self Care | Payer: Medicaid Other | Source: Ambulatory Visit | Attending: Vascular Surgery | Admitting: Vascular Surgery

## 2021-04-10 ENCOUNTER — Ambulatory Visit (HOSPITAL_COMMUNITY)
Admission: RE | Admit: 2021-04-10 | Discharge: 2021-04-10 | Disposition: A | Discharge status: Home / Self Care | Payer: Medicaid Other | Source: Ambulatory Visit | Attending: Vascular Surgery | Admitting: Vascular Surgery

## 2021-04-10 ENCOUNTER — Other Ambulatory Visit: Payer: Self-pay

## 2021-04-10 VITALS — BP 161/79 | HR 77 | Temp 97.6°F | Resp 14 | Ht 66.0 in | Wt 99.3 lb

## 2021-04-10 DIAGNOSIS — I739 Peripheral vascular disease, unspecified: Secondary | ICD-10-CM | POA: Insufficient documentation
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
## 2021-04-14 ENCOUNTER — Other Ambulatory Visit: Payer: Self-pay

## 2021-04-14 DIAGNOSIS — I739 Peripheral vascular disease, unspecified: Secondary | ICD-10-CM

## 2021-04-24 ENCOUNTER — Other Ambulatory Visit: Payer: Self-pay | Admitting: *Deleted

## 2021-04-24 ENCOUNTER — Other Ambulatory Visit: Payer: Self-pay | Admitting: Surgery

## 2021-04-24 MED ORDER — ATENOLOL-CHLORTHALIDONE 50-25 MG PO TABS: 1.0000 | ORAL_TABLET | Freq: Every day | ORAL | 0 refills | Status: DC

## 2021-04-25 DIAGNOSIS — F0390 Unspecified dementia without behavioral disturbance: Secondary | ICD-10-CM

## 2021-05-31 ENCOUNTER — Other Ambulatory Visit: Payer: Self-pay

## 2021-05-31 ENCOUNTER — Ambulatory Visit (INDEPENDENT_AMBULATORY_CARE_PROVIDER_SITE_OTHER): Payer: Medicaid Other | Admitting: Podiatrist

## 2021-05-31 ENCOUNTER — Ambulatory Visit: Payer: Medicaid Other | Admitting: Podiatry

## 2021-05-31 ENCOUNTER — Encounter: Payer: Self-pay | Admitting: Podiatrist

## 2021-05-31 DIAGNOSIS — R0989 Other specified symptoms and signs involving the circulatory and respiratory systems: Secondary | ICD-10-CM

## 2021-05-31 DIAGNOSIS — B351 Tinea unguium: Secondary | ICD-10-CM

## 2021-05-31 DIAGNOSIS — M79676 Pain in unspecified toe(s): Secondary | ICD-10-CM

## 2021-05-31 NOTE — Patient Instructions (Signed)

## 2021-05-31 NOTE — Progress Notes (Signed)
Chief Complaint  Patient presents with   Routine Foot Care     HPI: Patient is 65 y.o. male who presents with his caregiver today for painful and elongated toenails which are painful with shoes and with ambulation. He relates the left and right hallux nails are the most bothersome.  He denies any pedal injuries or complaints. He has been seen by vascular in the past and has been diagnosed with limb threatening ischemia to the right lower extremity.  He had a wound which has gone on to heal.  Currently denies any new wounds.   Patient Active Problem List   Diagnosis Date Noted   Lung nodule    AAA (abdominal aortic aneurysm) without rupture 10/21/2012   Thoracic aneurysm without mention of rupture 10/21/2012   Hyperlipidemia 10/18/2012   Poor short term memory 10/18/2012   Depression 10/16/2012   Dry skin 10/16/2012   Urge urinary incontinence 08/26/2012   Lumbosacral radiculopathy at L4 07/30/2012   Hyperextension of knee or lower leg 05/08/2012   Shoulder pain, right 02/16/2012   Healthcare maintenance 02/16/2012   Cardiomyopathy, nonischemic ([place]) 02/14/2012   Short-term memory loss 02/14/2012   Hyponatremia 10/18/2011   Seizures ([place]) 10/18/2011   Hypertension 10/18/2011   TBI (traumatic brain injury) 10/18/2011   H/O alcohol abuse 10/18/2011   Tobacco abuse 10/18/2011    Current Outpatient Medications on File Prior to Visit  Medication Sig Dispense Refill   acetaminophen (TYLENOL) 500 MG tablet Take 1,000 mg by mouth daily.     aspirin EC 81 MG tablet Take 1 tablet (81 mg total) by mouth daily. Swallow whole. 150 tablet 2   atenolol-chlorthalidone (TENORETIC 50) 50-25 MG tablet Take 1 tablet by mouth daily. 90 tablet 0   busPIRone (BUSPAR) 10 MG tablet Take 10 mg by mouth 3 (three) times daily.     clopidogrel (PLAVIX) 75 MG tablet Take 1 tablet (75 mg total) by mouth daily. (Patient not taking: Reported on 04/10/2021) 30 tablet 11   diphenhydrAMINE (BENADRYL) 50 MG  capsule Take 1 capsule (50 mg total) by mouth once for 1 dose. Take one tablet by mouth the night before procedure 1 capsule 0   Ergocalciferol (VITAMIN D2) 10 MCG (400 UNIT) TABS Take 800 Units by mouth daily.     famotidine (PEPCID) 20 MG tablet Take 1 tablet (20 mg total) by mouth daily for 1 day. Take one tablet by mouth the night before procedure 1 tablet 0   mirtazapine (REMERON) 15 MG tablet Take 15 mg by mouth at bedtime.     Multiple Vitamins-Minerals (CERTAVITE/ANTIOXIDANTS) TABS Take 1 tablet by mouth daily.     Nutritional Supplement LIQD Take 1 Bottle by mouth 2 (two) times daily. Mighty Shake     phenytoin (DILANTIN) 100 MG ER capsule Take 200 mg by mouth at bedtime.      phenytoin (DILANTIN) 30 MG ER capsule Take 60 mg by mouth at bedtime.      pravastatin (PRAVACHOL) 40 MG tablet Take 40 mg by mouth at bedtime.     prednisoLONE 5 MG TABS tablet Take by mouth. (Patient not taking: Reported on 04/10/2021)     No current facility-administered medications on file prior to visit.    Allergies  Allergen Reactions   Isovue [Iopamidol] Hives    Pt broke out in one hive on his chest after contrast on 01/03/15.  Pt will need full premeds in the future per Dr Martinique.      Review of Systems No  fevers, chills, nausea, muscle aches, no difficulty breathing, no calf pain, no chest pain or shortness of breath.   Physical Exam  GENERAL APPEARANCE: Alert, conversant. Appropriately groomed. No acute distress.   VASCULAR: Pedal pulses non palpable DP and PT bilateral.  Capillary refill time is decreased to all digits,  Proximal to distal cooling it warm to warm.  Digital hair growth absent.  NEUROLOGIC: sensation is intact to 5.07 monofilament at 5/5 sites bilateral.  Light touch is intact bilateral, vibratory sensation intact bilateral  MUSCULOSKELETAL: acceptable muscle strength, tone and stability bilateral.  No gross boney pedal deformities noted.  No pain, crepitus or limitation noted  with foot and ankle range of motion bilateral.   DERMATOLOGIC: skin is warm, supple, and dry.  No open lesions noted.  No rash, no pre ulcerative lesions. No open wounds seen.  Digital nails are thick, discolored, dystrophic, crumbly and clinically mycotic x 10 with bilateral first being the most symptomatic.  Callus medial left hallux is also noted.     Assessment   1. Pain due to onychomycosis of toenail   2. Absent pulse in lower extremity      Plan  -Patient evaluated and treatment recommendations discussed.   -Debridement of toenails 1-5 bilateral carried out with sterile nipper and burr without complication. -Slight debridement of callus tissue left hallux also performed without complication -Recommended moisturizing the feet daily and to perform daily foot inspections. -He is to call if any new injuries or wounds are noticed -Otherwise, will be seen back in 3 months for follow up for at risk foot care with Dr. Elisha Ponder

## 2021-06-14 ENCOUNTER — Observation Stay (HOSPITAL_COMMUNITY): Payer: Medicaid Other

## 2021-06-14 ENCOUNTER — Emergency Department (HOSPITAL_COMMUNITY): Payer: Medicaid Other

## 2021-06-14 ENCOUNTER — Inpatient Hospital Stay (HOSPITAL_COMMUNITY)
Admission: EM | Admit: 2021-06-14 | Discharge: 2021-06-24 | DRG: 082 | Disposition: A | Discharge status: Home Health | Payer: Medicaid Other | Source: Skilled Nursing Facility | Attending: Family Medicine | Admitting: Family Medicine

## 2021-06-14 DIAGNOSIS — F17210 Nicotine dependence, cigarettes, uncomplicated: Secondary | ICD-10-CM | POA: Diagnosis not present

## 2021-06-14 DIAGNOSIS — F0394 Unspecified dementia, unspecified severity, with anxiety: Secondary | ICD-10-CM | POA: Diagnosis present

## 2021-06-14 DIAGNOSIS — Z800 Family history of malignant neoplasm of digestive organs: Secondary | ICD-10-CM

## 2021-06-14 DIAGNOSIS — E43 Unspecified severe protein-calorie malnutrition: Secondary | ICD-10-CM | POA: Diagnosis not present

## 2021-06-14 DIAGNOSIS — E876 Hypokalemia: Secondary | ICD-10-CM | POA: Diagnosis present

## 2021-06-14 DIAGNOSIS — M79675 Pain in left toe(s): Secondary | ICD-10-CM | POA: Diagnosis not present

## 2021-06-14 DIAGNOSIS — Z79899 Other long term (current) drug therapy: Secondary | ICD-10-CM

## 2021-06-14 DIAGNOSIS — S0181XA Laceration without foreign body of other part of head, initial encounter: Secondary | ICD-10-CM | POA: Diagnosis present

## 2021-06-14 DIAGNOSIS — I708 Atherosclerosis of other arteries: Secondary | ICD-10-CM | POA: Diagnosis present

## 2021-06-14 DIAGNOSIS — I70203 Unspecified atherosclerosis of native arteries of extremities, bilateral legs: Secondary | ICD-10-CM | POA: Diagnosis present

## 2021-06-14 DIAGNOSIS — W19XXXA Unspecified fall, initial encounter: Secondary | ICD-10-CM | POA: Diagnosis present

## 2021-06-14 DIAGNOSIS — I10 Essential (primary) hypertension: Secondary | ICD-10-CM | POA: Diagnosis not present

## 2021-06-14 DIAGNOSIS — L03116 Cellulitis of left lower limb: Secondary | ICD-10-CM | POA: Diagnosis not present

## 2021-06-14 DIAGNOSIS — Z95820 Peripheral vascular angioplasty status with implants and grafts: Secondary | ICD-10-CM

## 2021-06-14 DIAGNOSIS — G40909 Epilepsy, unspecified, not intractable, without status epilepticus: Secondary | ICD-10-CM | POA: Diagnosis not present

## 2021-06-14 DIAGNOSIS — R296 Repeated falls: Secondary | ICD-10-CM | POA: Diagnosis not present

## 2021-06-14 DIAGNOSIS — Z888 Allergy status to other drugs, medicaments and biological substances status: Secondary | ICD-10-CM

## 2021-06-14 DIAGNOSIS — F0393 Unspecified dementia, unspecified severity, with mood disturbance: Secondary | ICD-10-CM | POA: Diagnosis not present

## 2021-06-14 DIAGNOSIS — E7800 Pure hypercholesterolemia, unspecified: Secondary | ICD-10-CM | POA: Diagnosis not present

## 2021-06-14 DIAGNOSIS — R7989 Other specified abnormal findings of blood chemistry: Secondary | ICD-10-CM

## 2021-06-14 DIAGNOSIS — Z681 Body mass index (BMI) 19 or less, adult: Secondary | ICD-10-CM

## 2021-06-14 DIAGNOSIS — K760 Fatty (change of) liver, not elsewhere classified: Secondary | ICD-10-CM | POA: Diagnosis not present

## 2021-06-14 DIAGNOSIS — I959 Hypotension, unspecified: Secondary | ICD-10-CM | POA: Diagnosis not present

## 2021-06-14 DIAGNOSIS — Z8249 Family history of ischemic heart disease and other diseases of the circulatory system: Secondary | ICD-10-CM

## 2021-06-14 DIAGNOSIS — Z87820 Personal history of traumatic brain injury: Secondary | ICD-10-CM | POA: Diagnosis not present

## 2021-06-14 DIAGNOSIS — E871 Hypo-osmolality and hyponatremia: Secondary | ICD-10-CM | POA: Diagnosis not present

## 2021-06-14 DIAGNOSIS — D649 Anemia, unspecified: Secondary | ICD-10-CM | POA: Diagnosis present

## 2021-06-14 DIAGNOSIS — S065XAA Traumatic subdural hemorrhage with loss of consciousness status unknown, initial encounter: Secondary | ICD-10-CM | POA: Diagnosis present

## 2021-06-14 DIAGNOSIS — M6282 Rhabdomyolysis: Secondary | ICD-10-CM | POA: Diagnosis present

## 2021-06-14 DIAGNOSIS — Z20822 Contact with and (suspected) exposure to covid-19: Secondary | ICD-10-CM | POA: Diagnosis present

## 2021-06-14 DIAGNOSIS — Z8679 Personal history of other diseases of the circulatory system: Secondary | ICD-10-CM

## 2021-06-14 DIAGNOSIS — F0390 Unspecified dementia without behavioral disturbance: Secondary | ICD-10-CM

## 2021-06-14 DIAGNOSIS — I739 Peripheral vascular disease, unspecified: Secondary | ICD-10-CM

## 2021-06-14 DIAGNOSIS — Z7902 Long term (current) use of antithrombotics/antiplatelets: Secondary | ICD-10-CM

## 2021-06-14 DIAGNOSIS — Z7982 Long term (current) use of aspirin: Secondary | ICD-10-CM

## 2021-06-14 DIAGNOSIS — Z803 Family history of malignant neoplasm of breast: Secondary | ICD-10-CM

## 2021-06-14 LAB — CBC WITH DIFFERENTIAL/PLATELET
Abs Immature Granulocytes: 0.04 10*3/uL (ref 0.00–0.07)
Basophils Absolute: 0 10*3/uL (ref 0.0–0.1)
Basophils Relative: 0 %
Eosinophils Absolute: 0.1 10*3/uL (ref 0.0–0.5)
Eosinophils Relative: 1 %
HCT: 35.7 % — ABNORMAL LOW (ref 39.0–52.0)
Hemoglobin: 11.8 g/dL — ABNORMAL LOW (ref 13.0–17.0)
Immature Granulocytes: 0 %
Lymphocytes Relative: 11 %
Lymphs Abs: 1.3 10*3/uL (ref 0.7–4.0)
MCH: 30.1 pg (ref 26.0–34.0)
MCHC: 33.1 g/dL (ref 30.0–36.0)
MCV: 91.1 fL (ref 80.0–100.0)
Monocytes Absolute: 1 10*3/uL (ref 0.1–1.0)
Monocytes Relative: 8 %
Neutro Abs: 10.2 10*3/uL — ABNORMAL HIGH (ref 1.7–7.7)
Neutrophils Relative %: 80 %
Platelets: 211 10*3/uL (ref 150–400)
RBC: 3.92 MIL/uL — ABNORMAL LOW (ref 4.22–5.81)
RDW: 13 % (ref 11.5–15.5)
WBC: 12.6 10*3/uL — ABNORMAL HIGH (ref 4.0–10.5)
nRBC: 0 % (ref 0.0–0.2)

## 2021-06-14 LAB — URINALYSIS, COMPLETE (UACMP) WITH MICROSCOPIC
Bacteria, UA: NONE SEEN
Bilirubin Urine: NEGATIVE
Glucose, UA: 50 mg/dL — AB
Hgb urine dipstick: NEGATIVE
Ketones, ur: NEGATIVE mg/dL
Leukocytes,Ua: NEGATIVE
Nitrite: NEGATIVE
Protein, ur: NEGATIVE mg/dL
Specific Gravity, Urine: 1.015 (ref 1.005–1.030)
pH: 8 (ref 5.0–8.0)

## 2021-06-14 LAB — BASIC METABOLIC PANEL
Anion gap: 9 (ref 5–15)
BUN: 29 mg/dL — ABNORMAL HIGH (ref 8–23)
CO2: 31 mmol/L (ref 22–32)
Calcium: 9.2 mg/dL (ref 8.9–10.3)
Chloride: 96 mmol/L — ABNORMAL LOW (ref 98–111)
Creatinine, Ser: 0.93 mg/dL (ref 0.61–1.24)
GFR, Estimated: >60 mL/min (ref >60)
Glucose, Bld: 116 mg/dL — ABNORMAL HIGH (ref 70–99)
Potassium: 3.3 mmol/L — ABNORMAL LOW (ref 3.5–5.1)
Sodium: 136 mmol/L (ref 135–145)

## 2021-06-14 LAB — MRSA NEXT GEN BY PCR, NASAL: MRSA by PCR Next Gen: NOT DETECTED

## 2021-06-14 LAB — RESP PANEL BY RT-PCR (FLU A&B, COVID) ARPGX2
Influenza A by PCR: NEGATIVE
Influenza B by PCR: NEGATIVE
SARS Coronavirus 2 by RT PCR: NEGATIVE

## 2021-06-14 LAB — MAGNESIUM: Magnesium: 2.3 mg/dL (ref 1.7–2.4)

## 2021-06-14 LAB — IRON AND TIBC
Iron: 84 ug/dL (ref 45–182)
Saturation Ratios: 22 % (ref 17.9–39.5)
TIBC: 379 ug/dL (ref 250–450)
UIBC: 295 ug/dL

## 2021-06-14 LAB — HIV ANTIBODY (ROUTINE TESTING W REFLEX): HIV Screen 4th Generation wRfx: NONREACTIVE

## 2021-06-14 LAB — HEMOGLOBIN AND HEMATOCRIT, BLOOD
HCT: 36.6 % — ABNORMAL LOW (ref 39.0–52.0)
Hemoglobin: 12.3 g/dL — ABNORMAL LOW (ref 13.0–17.0)

## 2021-06-14 MED ORDER — ACETAMINOPHEN 650 MG RE SUPP: 650.0000 mg | Freq: Four times a day (QID) | RECTAL | Status: DC | PRN

## 2021-06-14 MED ORDER — POTASSIUM CHLORIDE CRYS ER 20 MEQ PO TBCR
40.0000 meq | EXTENDED_RELEASE_TABLET | Freq: Every day | ORAL | Status: DC
  Administered 2021-06-14 – 2021-06-17 (×4): 40 meq via ORAL
  Filled 2021-06-14 (×4): qty 2

## 2021-06-14 MED ORDER — LIDOCAINE-EPINEPHRINE (PF) 2 %-1:200000 IJ SOLN
10.0000 mL | Freq: Once | INTRAMUSCULAR | Status: AC
  Administered 2021-06-14: 10 mL via INTRADERMAL
  Filled 2021-06-14: qty 20

## 2021-06-14 MED ORDER — SODIUM CHLORIDE 0.9% FLUSH
3.0000 mL | Freq: Two times a day (BID) | INTRAVENOUS | Status: DC
  Administered 2021-06-14 – 2021-06-24 (×19): 3 mL via INTRAVENOUS

## 2021-06-14 MED ORDER — ONDANSETRON HCL 4 MG/2ML IJ SOLN: 4.0000 mg | Freq: Four times a day (QID) | INTRAMUSCULAR | Status: DC | PRN

## 2021-06-14 MED ORDER — ONDANSETRON HCL 4 MG PO TABS: 4.0000 mg | ORAL_TABLET | Freq: Four times a day (QID) | ORAL | Status: DC | PRN

## 2021-06-14 MED ORDER — ACETAMINOPHEN 325 MG PO TABS
650.0000 mg | ORAL_TABLET | Freq: Four times a day (QID) | ORAL | Status: DC | PRN
  Administered 2021-06-16 – 2021-06-24 (×6): 650 mg via ORAL
  Filled 2021-06-14 (×6): qty 2

## 2021-06-14 NOTE — ED Provider Notes (Signed)
I provided a substantive portion of the care of this patient.  I personally performed the entirety of the exam for this encounter.     She has history of TBI.  He is at [facility].  He has had 5 falls in the past 2 weeks.  Patient is denying areas of significant pain.  He reports he has some discomfort at the side of his face where he is a laceration but is denying generalized headache.  Patient is no longer on anticoagulants.  Apparently discontinued in October.  Patient is alert and answering questions.  He has extensive resolving ecchymosis below both eyes and.  Small laceration to the right temple not actively bleeding at this time.  Extraocular motions intact.  Pupils are symmetric and reactive.  Patient follows commands for grip strength and moving his lower extremities.  He does have some swelling or deformity to the lateral aspect of the left hip.  He does have some intact range of motion.  We will also add films of pelvis.  CT has identified subdural hematoma 11 mm with mild mass-effect.  At this time patient's mental status is awake and protecting airway.  Neurosurgery to be consulted by PA-C Groce.  Agree with plan of management.   Charlesetta Shanks, MD 06/14/21 1134

## 2021-06-14 NOTE — Progress Notes (Signed)
Patient ID: [name], male   DOB: [birth year], 65 y.o.   MRN: [id number] Called about this72 year old gentleman with unwitnessed falls to evaluate CT scan showing a 1 cm acute subdural hematoma.  This is occurring in the setting with a patient with significant vertical atrophy this subdural is causing minimal to no mass effect.  Patient has Plavix listed on his home meds but it's possible he has not been taking this for 2 months regardless I would hold the Plavix okay to continue a baby aspirin for now.  I would repeat his CT scan in 6-8 hours and recommend overnight observation the medicine service due to his multiple medical comorbidities.  However this is nonoperative from a neurosurgical perspective.  Please notify us when repeat CT scan is obtained for review.I also do not think that the patient has NPH it seems like he has ventriculomegaly ex vacuo

## 2021-06-14 NOTE — Progress Notes (Signed)
Pt arrived to 4NP12 from [facility] ED. Pt oriented to self and place at times, but repeatedly questioning why he is here and where he is from. Vitals WNL. Skin intact aside from bruising. CHG bath completed, MRSA swab sent to lab, pt admitted to cardiac telemetry. Full assessment charted.   Justice Rocher, RN
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
## 2021-06-14 NOTE — Progress Notes (Signed)
EEG done at bedside. No skin breakdown noted. Patient confused. Results pending.
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
## 2021-06-14 NOTE — ED Triage Notes (Signed)
Pt BIB [facility] from [facility] for unwitnessed fall. Staff reports blood noted on bedside lamp. 2" lac near right eye. No reported LOC. Hx dementia, A&Ox2, baseline. Sister reports that bruising noted to forehead, nose, bilateral cheeks, right shoulder, spots on chest. Uncertain if left arm bruising is new. 5 falls in lat 2 weeks. Pt endorses some pain in right arm. No other complaints. No blood thinners.  BP 150/78 HR 66 CBG 136

## 2021-06-15 ENCOUNTER — Observation Stay (HOSPITAL_COMMUNITY): Payer: Medicaid Other

## 2021-06-15 DIAGNOSIS — R296 Repeated falls: Secondary | ICD-10-CM | POA: Diagnosis present

## 2021-06-15 DIAGNOSIS — E876 Hypokalemia: Secondary | ICD-10-CM | POA: Diagnosis present

## 2021-06-15 DIAGNOSIS — E43 Unspecified severe protein-calorie malnutrition: Secondary | ICD-10-CM

## 2021-06-15 DIAGNOSIS — F17210 Nicotine dependence, cigarettes, uncomplicated: Secondary | ICD-10-CM | POA: Diagnosis present

## 2021-06-15 DIAGNOSIS — Z681 Body mass index (BMI) 19 or less, adult: Secondary | ICD-10-CM | POA: Diagnosis not present

## 2021-06-15 DIAGNOSIS — M79675 Pain in left toe(s): Secondary | ICD-10-CM | POA: Diagnosis present

## 2021-06-15 DIAGNOSIS — S0181XA Laceration without foreign body of other part of head, initial encounter: Secondary | ICD-10-CM | POA: Diagnosis present

## 2021-06-15 DIAGNOSIS — I10 Essential (primary) hypertension: Secondary | ICD-10-CM | POA: Diagnosis present

## 2021-06-15 DIAGNOSIS — M6282 Rhabdomyolysis: Secondary | ICD-10-CM | POA: Diagnosis present

## 2021-06-15 DIAGNOSIS — Z8249 Family history of ischemic heart disease and other diseases of the circulatory system: Secondary | ICD-10-CM | POA: Diagnosis not present

## 2021-06-15 DIAGNOSIS — Z20822 Contact with and (suspected) exposure to covid-19: Secondary | ICD-10-CM | POA: Diagnosis present

## 2021-06-15 DIAGNOSIS — F0393 Unspecified dementia, unspecified severity, with mood disturbance: Secondary | ICD-10-CM | POA: Diagnosis present

## 2021-06-15 DIAGNOSIS — K760 Fatty (change of) liver, not elsewhere classified: Secondary | ICD-10-CM | POA: Diagnosis present

## 2021-06-15 DIAGNOSIS — G40909 Epilepsy, unspecified, not intractable, without status epilepticus: Secondary | ICD-10-CM | POA: Diagnosis present

## 2021-06-15 DIAGNOSIS — R748 Abnormal levels of other serum enzymes: Secondary | ICD-10-CM

## 2021-06-15 DIAGNOSIS — R569 Unspecified convulsions: Secondary | ICD-10-CM | POA: Diagnosis not present

## 2021-06-15 DIAGNOSIS — I739 Peripheral vascular disease, unspecified: Secondary | ICD-10-CM

## 2021-06-15 DIAGNOSIS — E7800 Pure hypercholesterolemia, unspecified: Secondary | ICD-10-CM | POA: Diagnosis present

## 2021-06-15 DIAGNOSIS — S065X0A Traumatic subdural hemorrhage without loss of consciousness, initial encounter: Secondary | ICD-10-CM | POA: Diagnosis not present

## 2021-06-15 DIAGNOSIS — Z87820 Personal history of traumatic brain injury: Secondary | ICD-10-CM | POA: Diagnosis not present

## 2021-06-15 DIAGNOSIS — R262 Difficulty in walking, not elsewhere classified: Secondary | ICD-10-CM

## 2021-06-15 DIAGNOSIS — F0394 Unspecified dementia, unspecified severity, with anxiety: Secondary | ICD-10-CM | POA: Diagnosis present

## 2021-06-15 DIAGNOSIS — Z95820 Peripheral vascular angioplasty status with implants and grafts: Secondary | ICD-10-CM | POA: Diagnosis not present

## 2021-06-15 DIAGNOSIS — L03116 Cellulitis of left lower limb: Secondary | ICD-10-CM | POA: Diagnosis present

## 2021-06-15 DIAGNOSIS — Z888 Allergy status to other drugs, medicaments and biological substances status: Secondary | ICD-10-CM | POA: Diagnosis not present

## 2021-06-15 DIAGNOSIS — I70203 Unspecified atherosclerosis of native arteries of extremities, bilateral legs: Secondary | ICD-10-CM | POA: Diagnosis not present

## 2021-06-15 DIAGNOSIS — W19XXXA Unspecified fall, initial encounter: Secondary | ICD-10-CM

## 2021-06-15 DIAGNOSIS — R55 Syncope and collapse: Secondary | ICD-10-CM

## 2021-06-15 DIAGNOSIS — S065XAA Traumatic subdural hemorrhage with loss of consciousness status unknown, initial encounter: Secondary | ICD-10-CM | POA: Diagnosis present

## 2021-06-15 DIAGNOSIS — I7123 Aneurysm of the descending thoracic aorta, without rupture: Secondary | ICD-10-CM | POA: Diagnosis not present

## 2021-06-15 DIAGNOSIS — D649 Anemia, unspecified: Secondary | ICD-10-CM | POA: Diagnosis present

## 2021-06-15 DIAGNOSIS — E871 Hypo-osmolality and hyponatremia: Secondary | ICD-10-CM | POA: Diagnosis not present

## 2021-06-15 DIAGNOSIS — I959 Hypotension, unspecified: Secondary | ICD-10-CM | POA: Diagnosis present

## 2021-06-15 LAB — COMPREHENSIVE METABOLIC PANEL
ALT: 249 U/L — ABNORMAL HIGH (ref 0–44)
AST: 329 U/L — ABNORMAL HIGH (ref 15–41)
Albumin: 3.9 g/dL (ref 3.5–5.0)
Alkaline Phosphatase: 80 U/L (ref 38–126)
Anion gap: 8 (ref 5–15)
BUN: 20 mg/dL (ref 8–23)
CO2: 30 mmol/L (ref 22–32)
Calcium: 9.4 mg/dL (ref 8.9–10.3)
Chloride: 96 mmol/L — ABNORMAL LOW (ref 98–111)
Creatinine, Ser: 0.91 mg/dL (ref 0.61–1.24)
GFR, Estimated: >60 mL/min (ref >60)
Glucose, Bld: 100 mg/dL — ABNORMAL HIGH (ref 70–99)
Potassium: 3.5 mmol/L (ref 3.5–5.1)
Sodium: 134 mmol/L — ABNORMAL LOW (ref 135–145)
Total Bilirubin: 1 mg/dL (ref 0.3–1.2)
Total Protein: 7.1 g/dL (ref 6.5–8.1)

## 2021-06-15 LAB — ECHOCARDIOGRAM COMPLETE
AR max vel: 2.39 cm2
AV Area VTI: 2.5 cm2
AV Area mean vel: 2.41 cm2
AV Mean grad: 2 mmHg
AV Peak grad: 3.7 mmHg
Ao pk vel: 0.96 m/s
Area-P 1/2: 3.53 cm2
Height: 66 in
S' Lateral: 2.3 cm
Weight: 1626.11 oz

## 2021-06-15 LAB — CBC
HCT: 33.4 % — ABNORMAL LOW (ref 39.0–52.0)
Hemoglobin: 11.3 g/dL — ABNORMAL LOW (ref 13.0–17.0)
MCH: 30.2 pg (ref 26.0–34.0)
MCHC: 33.8 g/dL (ref 30.0–36.0)
MCV: 89.3 fL (ref 80.0–100.0)
Platelets: 209 10*3/uL (ref 150–400)
RBC: 3.74 MIL/uL — ABNORMAL LOW (ref 4.22–5.81)
RDW: 12.7 % (ref 11.5–15.5)
WBC: 9.6 10*3/uL (ref 4.0–10.5)
nRBC: 0 % (ref 0.0–0.2)

## 2021-06-15 LAB — CK: Total CK: 267 U/L (ref 49–397)

## 2021-06-15 LAB — GLUCOSE, CAPILLARY: Glucose-Capillary: 112 mg/dL — ABNORMAL HIGH (ref 70–99)

## 2021-06-15 MED ORDER — BUSPIRONE HCL 5 MG PO TABS
10.0000 mg | ORAL_TABLET | Freq: Two times a day (BID) | ORAL | Status: DC
  Administered 2021-06-15 – 2021-06-24 (×19): 10 mg via ORAL
  Filled 2021-06-15 (×19): qty 2

## 2021-06-15 MED ORDER — PRAVASTATIN SODIUM 10 MG PO TABS: 40.0000 mg | ORAL_TABLET | Freq: Every day | ORAL | Status: DC

## 2021-06-15 MED ORDER — PHENYTOIN SODIUM EXTENDED 100 MG PO CAPS: 200.0000 mg | ORAL_CAPSULE | Freq: Every day | ORAL | Status: DC

## 2021-06-15 MED ORDER — PHENYTOIN SODIUM EXTENDED 30 MG PO CAPS: 60.0000 mg | ORAL_CAPSULE | Freq: Every day | ORAL | Status: DC

## 2021-06-15 MED ORDER — MIRTAZAPINE 15 MG PO TABS
15.0000 mg | ORAL_TABLET | Freq: Every day | ORAL | Status: DC
  Administered 2021-06-15 – 2021-06-23 (×9): 15 mg via ORAL
  Filled 2021-06-15 (×9): qty 1

## 2021-06-15 MED ORDER — ENSURE ENLIVE PO LIQD
237.0000 mL | Freq: Three times a day (TID) | ORAL | Status: DC
  Administered 2021-06-15 – 2021-06-24 (×24): 237 mL via ORAL

## 2021-06-15 MED ORDER — PROSIGHT PO TABS
1.0000 | ORAL_TABLET | Freq: Every day | ORAL | Status: DC
  Administered 2021-06-15 – 2021-06-24 (×10): 1 via ORAL
  Filled 2021-06-15 (×10): qty 1

## 2021-06-15 MED ORDER — PHENYTOIN SODIUM EXTENDED 30 MG PO CAPS
260.0000 mg | ORAL_CAPSULE | Freq: Every day | ORAL | Status: DC
  Administered 2021-06-15 – 2021-06-23 (×9): 260 mg via ORAL
  Filled 2021-06-15 (×10): qty 2

## 2021-06-15 NOTE — Evaluation (Signed)
Physical Therapy Evaluation Patient Details Name: [name] MRN: [id number] DOB: 12/17/1955 Today's Date: 06/15/2021  History of Present Illness  [name] is a 65 y.o. male with medical history significant of TBI, PAD, TAA/AAA, HLD, HTN. Presenting with right brow laceration after fall. History per sister and chart review. Patient has had several weeks of falls at his ALF w/ multiple head injuries during this time.CT showed Acute subdural hematoma on the right with maximal thickness of 11 mm. Mild mass-effect upon the right hemisphere but no midline shift. DPO:EUMPNTIR of epileptogenicity arising from right frontal-anterior temporal region. Additionally there is cortical dysfunction arising from left and right frontotemporal regio, nonspecific in etiology.  Clinical Impression  Patient admitted with above diagnosis. Patient lived at [facility] prior to admission and has had 5 falls in past 2 weeks with each time striking his head. Patient currently presents with generalized weakness, impaired balance, decreased activity tolerance, and impaired cognition. Patient requires minA for mobility with use of RW. Patient uses R AFO for foot drop and hyperextension knee brace on R. Patient will benefit from skilled PT services during acute stay to address listed deficits. Recommend SNF at discharge to maximize functional mobility, reduce fall risk, and improve safety awareness.        Recommendations for follow up therapy are one component of a multi-disciplinary discharge planning process, led by the attending physician.  Recommendations may be updated based on patient status, additional functional criteria and insurance authorization.  Follow Up Recommendations Skilled nursing-short term rehab (<3 hours/day)    Assistance Recommended at Discharge Frequent or constant Supervision/Assistance  Functional Status Assessment Patient has had a recent decline in their functional status and demonstrates  the ability to make significant improvements in function in a reasonable and predictable amount of time.  Equipment Recommendations  Other (comment) (defer to next venue of care)    Recommendations for Other Services       Precautions / Restrictions Precautions Precautions: Fall Precaution Comments: hx of falls, R AFO and hyperextension brace (place over pants - does not fit without them) Restrictions Weight Bearing Restrictions: No      Mobility  Bed Mobility Overal bed mobility: Needs Assistance Bed Mobility: Supine to Sit;Sit to Supine     Supine to sit: Min assist Sit to supine: Min guard   General bed mobility comments: minA to bring LEs towards EOB. Returned to supine due to ultrasound tech present.    Transfers Overall transfer level: Needs assistance Equipment used: Rolling Nissan Frazzini (2 wheels) Transfers: Sit to/from Stand Sit to Stand: Min assist           General transfer comment: minA for steadying due to initial posterior lean    Ambulation/Gait Ambulation/Gait assistance: Min assist Gait Distance (Feet): 20 Feet Assistive device: Rolling Kailin Principato (2 wheels) Gait Pattern/deviations: Step-to pattern;Decreased stride length;Knee hyperextension - right Gait velocity: decreased     General Gait Details: minA for balance. Slow gait speed. No overt LOB with RW  Stairs            Wheelchair Mobility    Modified Rankin (Stroke Patients Only)       Balance Overall balance assessment: History of Falls;Needs assistance Sitting-balance support: No upper extremity supported;Feet supported Sitting balance-Leahy Scale: Fair     Standing balance support: Bilateral upper extremity supported;During functional activity;Reliant on assistive device for balance Standing balance-Leahy Scale: Poor Standing balance comment: reliant on UE support  Pertinent Vitals/Pain Pain Assessment: No/denies pain    Home Living  Family/patient expects to be discharged to:: Assisted living                 Home Equipment: Conservation officer, nature (2 wheels)      Prior Function Prior Level of Function : Needs assist;History of Falls (last six months)             Mobility Comments: living at ALF. Ambulates with RW but seems to attempt walking in room without AD resulting in falls. ADLs Comments: assists with dressing and donning braces     Hand Dominance        Extremity/Trunk Assessment   Upper Extremity Assessment Upper Extremity Assessment: Defer to OT evaluation    Lower Extremity Assessment Lower Extremity Assessment: RLE deficits/detail RLE Deficits / Details: hx of foot drop and knee hyperextension requiring AFO and brace    Cervical / Trunk Assessment Cervical / Trunk Assessment: Kyphotic  Communication   Communication: No difficulties  Cognition Arousal/Alertness: Awake/alert Behavior During Therapy: WFL for tasks assessed/performed Overall Cognitive Status: History of cognitive impairments - at baseline                                 General Comments: Disoriented to situation and time. Following commands appropriately.        General Comments General comments (skin integrity, edema, etc.): sister present and supportive    Exercises     Assessment/Plan    PT Assessment Patient needs continued PT services  PT Problem List Decreased strength;Decreased activity tolerance;Decreased balance;Decreased mobility;Decreased cognition;Decreased knowledge of use of DME;Decreased safety awareness;Decreased knowledge of precautions       PT Treatment Interventions Gait training;DME instruction;Functional mobility training;Therapeutic exercise;Therapeutic activities;Balance training;Patient/family education    PT Goals (Current goals can be found in the Care Plan section)  Acute Rehab PT Goals Patient Stated Goal: to stop falling PT Goal Formulation: With family Time For Goal  Achievement: 06/29/21 Potential to Achieve Goals: Fair    Frequency Min 3X/week   Barriers to discharge        Co-evaluation               AM-PAC PT "6 Clicks" Mobility  Outcome Measure Help needed turning from your back to your side while in a flat bed without using bedrails?: A Little Help needed moving from lying on your back to sitting on the side of a flat bed without using bedrails?: A Little Help needed moving to and from a bed to a chair (including a wheelchair)?: A Little Help needed standing up from a chair using your arms (e.g., wheelchair or bedside chair)?: A Little Help needed to walk in hospital room?: A Little Help needed climbing 3-5 steps with a railing? : Total 6 Click Score: 16    End of Session Equipment Utilized During Treatment: Gait belt;Other (comment) (R AFO and knee hyperextension brace) Activity Tolerance: Patient tolerated treatment well Patient left: in bed;with call bell/phone within reach;with bed alarm set Nurse Communication: Mobility status PT Visit Diagnosis: Muscle weakness (generalized) (M62.81);History of falling (Z91.81);Other abnormalities of gait and mobility (R26.89);Unsteadiness on feet (R26.81)    Time: 1740-8144 PT Time Calculation (min) (ACUTE ONLY): 48 min   Charges:   PT Evaluation $PT Eval Moderate Complexity: 1 Mod PT Treatments $Therapeutic Activity: 23-37 mins        Kymberli Wiegand A. Gilford Rile PT, DPT Acute Rehabilitation Services Pager  [place]   Linna Hoff 06/15/2021, 12:24 PM

## 2021-06-15 NOTE — Procedures (Signed)
Patient Name: [name]  MRN: [id number]  Epilepsy Attending: Lora Havens  Referring Physician/Provider: Jonnie Finner, DO Date: 06/14/2021 Duration: 21.15 mins  Patient history: 65 year old male with history of seizures now with subdural hematoma.  EEG to evaluate for seizure.    Level of alertness: Awake, asleep  AEDs during EEG study: PHT  Technical aspects: This EEG study was done with scalp electrodes positioned according to the 10-20 International system of electrode placement. Electrical activity was acquired at a sampling rate of 500Hz  and reviewed with a high frequency filter of 70Hz  and a low frequency filter of 1Hz . EEG data were recorded continuously and digitally stored.   Description: The posterior dominant rhythm consists of 9 Hz activity of moderate voltage (25-35 uV) seen predominantly in posterior head regions, symmetric and reactive to eye opening and eye closing. Sleep was characterized by vertex waves, sleep spindles (12 to 14 Hz), maximal frontocentral region. EEG showed intermittent 5-6Hz  theta slowing independently in left and right frontotemporal region. Spike was noted in right frontal-anterior temporal region. Hyperventilation and photic stimulation were not performed.     ABNORMALITY -Spike,right frontal-anterior temporal region - Intermittent slow,  left and right frontotemporal region.  IMPRESSION: This study showed evidence of epileptogenicity arising from right frontal-anterior temporal region. Additionally there is cortical dysfunction arising from left and right frontotemporal regio, nonspecific in etiology.  No seizures were seen during the study.  Saahas Hidrogo Barbra Sarks

## 2021-06-15 NOTE — Progress Notes (Signed)
Patient ID: [name], male   DOB: April 08, 1956, 65 y.o.   MRN: [id number] Stable CT head minimal to no mass-effect patient can be discharged when cleared from medical perspective.  Follow-up neurosurgery 3 to 4 weeks

## 2021-06-15 NOTE — Evaluation (Signed)
Occupational Therapy Evaluation Patient Details Name: [name] MRN: [id number] DOB: 1955-08-24 Today's Date: 06/15/2021   History of Present Illness [name] is a 65 y.o. male with medical history significant of TBI, PAD, TAA/AAA, HLD, HTN. Presenting with right brow laceration after fall. History per sister and chart review. Patient has had several weeks of falls at his ALF w/ multiple head injuries during this time.CT showed Acute subdural hematoma on the right with maximal thickness of 11 mm. Mild mass-effect upon the right hemisphere but no midline shift. 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 Additionally there is cortical dysfunction arising from left and right frontotemporal regio, nonspecific in etiology.   Clinical Impression   This 65 yo male admitted with above presents to acute OT with PLOF of being S-Mod I from a RW level for basic ADLs and mobility. Currently he is setup/S-Max A for basic ADLs. He will continue to benefit from acute OT with follow up OT at SNF.     Recommendations for follow up therapy are one component of a multi-disciplinary discharge planning process, led by the attending physician.  Recommendations may be updated based on patient status, additional functional criteria and insurance authorization.   Follow Up Recommendations  Skilled nursing-short term rehab (<3 hours/day)    Assistance Recommended at Discharge Frequent or constant Supervision/Assistance  Functional Status Assessment  Patient has had a recent decline in their functional status and demonstrates the ability to make significant improvements in function in a reasonable and predictable amount of time.  Equipment Recommendations  Other (comment) (RW and WC if he goes back to ALF instead of SNF first (really needs SNF first))       Precautions / Restrictions Precautions Precautions: Fall Precaution Comments: hx of falls, R AFO and  hyperextension brace (place over pants - does not fit without them) Required Braces or Orthoses: Other Brace Other Brace: R AFO and hyperextension brace Restrictions Weight Bearing Restrictions: No      Mobility Bed Mobility Overal bed mobility: Needs Assistance Bed Mobility: Supine to Sit;Sit to Supine     Supine to sit: Min guard;HOB elevated Sit to supine: Min guard    Transfers Overall transfer level: Needs assistance Equipment used: Rolling walker (2 wheels) Transfers: Sit to/from Stand Sit to Stand: Min assist           General transfer comment: minA for steadying due to initial posterior lean      Balance Overall balance assessment: Needs assistance Sitting-balance support: No upper extremity supported;Feet supported Sitting balance-Leahy Scale: Fair     Standing balance support: Bilateral upper extremity supported;During functional activity;Reliant on assistive device for balance Standing balance-Leahy Scale: Poor Standing balance comment: reliant on UE support                           ADL either performed or assessed with clinical judgement   ADL Overall ADL's : Needs assistance/impaired Eating/Feeding: Independent;Bed level   Grooming: Set up;Sitting;Supervision/safety Grooming Details (indicate cue type and reason): EOB Upper Body Bathing: Set up;Sitting;Supervision/ safety Upper Body Bathing Details (indicate cue type and reason): EOB Lower Body Bathing: Minimal assistance;Sit to/from stand   Upper Body Dressing : Set up;Supervision/safety;Sitting Upper Body Dressing Details (indicate cue type and reason): EOB Lower Body Dressing: Maximal assistance Lower Body Dressing Details (indicate cue type and reason): min A sit<>stand (A for socks,pants and AFOs Toilet Transfer: Minimal assistance;Ambulation;Rolling walker (2 wheels) Toilet Transfer Details (  indicate cue type and reason): simulated bed>window in room>bed Toileting- Clothing  Manipulation and Hygiene: Moderate assistance Toileting - Clothing Manipulation Details (indicate cue type and reason): min A sit<>stand             Vision Patient Visual Report: No change from baseline              Pertinent Vitals/Pain Pain Assessment: No/denies pain     Hand Dominance Right   Extremity/Trunk Assessment Upper Extremity Assessment Upper Extremity Assessment: Overall WFL for tasks assessed    RLE Deficits / Details: hx of foot drop and knee hyperextension requiring AFO and brace   Cervical / Trunk Assessment Cervical / Trunk Assessment: Kyphotic   Communication Communication Communication: No difficulties   Cognition Arousal/Alertness: Awake/alert Behavior During Therapy: WFL for tasks assessed/performed Overall Cognitive Status: History of cognitive impairments - at baseline                                 General Comments: said he did not know date--but was able to tell me when I asked individual questions instead of whole date     General Comments  sister present and supportive            Home Living Family/patient expects to be discharged to:: Assisted living                             Home Equipment: Rolling Walker (2 wheels)          Prior Functioning/Environment Prior Level of Function : Needs assist;History of Falls (last six months)             Mobility Comments: living at ALF. Ambulates with RW but seems to attempt walking in room without AD resulting in falls. ADLs Comments: Per pt and sister in room, pt can bath and dress himself with S for intermittent cues        OT Problem List: Impaired balance (sitting and/or standing);Decreased cognition      OT Treatment/Interventions: Self-care/ADL training;DME and/or AE instruction;Patient/family education    OT Goals(Current goals can be found in the care plan section) Acute Rehab OT Goals Patient Stated Goal: to go to rehab then back to  ALF OT Goal Formulation: With patient/family Time For Goal Achievement: 06/29/21 Potential to Achieve Goals: Good  OT Frequency: Min 2X/week              AM-PAC OT "6 Clicks" Daily Activity     Outcome Measure Help from another person eating meals?: None Help from another person taking care of personal grooming?: A Little Help from another person toileting, which includes using toliet, bedpan, or urinal?: A Little Help from another person bathing (including washing, rinsing, drying)?: A Lot Help from another person to put on and taking off regular upper body clothing?: A Little Help from another person to put on and taking off regular lower body clothing?: A Lot 6 Click Score: 17   End of Session Equipment Utilized During Treatment: Gait belt;Rolling walker (2 wheels)  Activity Tolerance: Patient tolerated treatment well Patient left: in bed;with call bell/phone within reach;with bed alarm set;with family/visitor present  OT Visit Diagnosis: Unsteadiness on feet (R26.81);Other abnormalities of gait and mobility (R26.89);History of falling (Z91.81)                Time: 1300-1320 OT Time Calculation (min): 20 min  Charges:  OT General Charges $OT Visit: 1 Visit OT Evaluation $OT Eval Moderate Complexity: [place], OTR/L Acute [employer] Pager [phone] Office [phone]    Almon Register 06/15/2021, 1:35 PM

## 2021-06-15 NOTE — Progress Notes (Signed)
Initial Nutrition Assessment  DOCUMENTATION CODES:   Underweight, Severe malnutrition in context of chronic illness  INTERVENTION:   - Liberalize diet to Regular, verbal with readback order placed per MD  - Ensure Enlive po TID, each supplement provides 350 kcal and 20 grams of protein  - MVI daily  - Encourage PO intake  NUTRITION DIAGNOSIS:   Severe Malnutrition related to chronic illness (TBI) as evidenced by severe muscle depletion, severe fat depletion.  GOAL:   Patient will meet greater than or equal to 90% of their needs  MONITOR:   PO intake, Supplement acceptance, Labs, Weight trends  REASON FOR ASSESSMENT:   Consult Assessment of nutrition requirement/status  ASSESSMENT:   65 year old male who presented on 12/21 from SNF after an unwitnessed fall with resulting laceration to forehead. PMH of TBI, PAD, TAA/AAA, HLD, HTN. Pt found to have acute SDH.  Spoke with pt at bedside. Pt drinking a soda and doing a crossword at time of visit. Pt reports having a "fair" appetite but that this is normal for him. Pt states that he used to eat much more but lately has been eating what he considers to be normal for him. Pt states that he typically eats 3 meals daily. Pt does snack between meals on crackers or chocolate. Pt states that he does drink oral nutrition supplements and consumes up to 3 daily. Pt willing to consume Ensure supplements during admission. RD provided pt with an Ensure Plus High Protein at time of visit and encouraged pt to drink it.  Pt states that he ate all of his lunch. Discussed pt with RN who did not witness lunch tray but did see pt eating well for breakfast this morning.  Pt reports a UBW of 120 lbs. He does not think that he has lost any weight recently. Current weight is 101.63 lbs. Reviewed weight history in chart. Weight trended down from October 2019 to September 2022 but has been trending up since that time. Pt with a 3.5 kg weight gain since  02/28/21 which is desirable given severe malnutrition.  Discussed diet liberalization with MD who agreed. Verbal with readback order placed for a Regular diet.  Meal Completion: 100% x 1 documented meal  Medications reviewed and include: remeron, multivitamin, dilantin, klor-con 40 mEq daily  Labs reviewed: sodium 134, elevated LFTs CBG's: 122  NUTRITION - FOCUSED PHYSICAL EXAM:  Flowsheet Row Most Recent Value  Orbital Region Severe depletion  Upper Arm Region Severe depletion  Thoracic and Lumbar Region Severe depletion  Buccal Region Severe depletion  Temple Region Severe depletion  Clavicle Bone Region Severe depletion  Clavicle and Acromion Bone Region Severe depletion  Scapular Bone Region Severe depletion  Dorsal Hand Severe depletion  Patellar Region Severe depletion  Anterior Thigh Region Severe depletion  Posterior Calf Region Severe depletion  Edema (RD Assessment) None  Hair Reviewed  Eyes Reviewed  Mouth Reviewed  Skin Reviewed  Nails Reviewed       Diet Order:   Diet Order             Diet regular Room service appropriate? Yes; Fluid consistency: Thin  Diet effective now                   EDUCATION NEEDS:   Education needs have been addressed  Skin:  Skin Assessment: Reviewed RN Assessment  Last BM:  no documented BM  Height:   Ht Readings from Last 1 Encounters:  06/14/21 5\' 6"  (1.676 m)  Weight:   Wt Readings from Last 1 Encounters:  06/15/21 46.1 kg    BMI:  Body mass index is 16.4 kg/m.  Estimated Nutritional Needs:   Kcal:  1600-1800  Protein:  75-85 grams  Fluid:  1.6-1.8 L    Gustavus Bryant, MS, RD, LDN Inpatient Clinical Dietitian Please see [facility] for contact information.
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
## 2021-06-15 NOTE — TOC CAGE-AID Note (Signed)
Transition of Care [facility]) - CAGE-AID Screening   Patient Details  Name: [name] MRN: [id number] Date of Birth: Nov 07, 1955  Transition of Care [facility] [place]) CM/SW Contact:    [name], [name] Phone Number: 06/15/2021, 9:33 AM   Clinical Narrative: Pt is unable to participate in Cage Aid.  Pt is confused.  [name], MSW, LCSW-A Pronouns:  She/Her/Hers [place] Transitions of Care Clinical Social Worker Direct Number:  [phone] [name]  CAGE-AID Screening: Substance Abuse Screening unable to be completed due to: : Patient unable to participate (Pt is confused.)             Substance Abuse Education Offered: No

## 2021-06-15 NOTE — Progress Notes (Signed)
°  Echocardiogram 2D Echocardiogram has been performed.  Merrie Roof F 06/15/2021, 10:42 AM

## 2021-06-16 ENCOUNTER — Inpatient Hospital Stay (HOSPITAL_COMMUNITY): Payer: Medicaid Other

## 2021-06-16 DIAGNOSIS — E43 Unspecified severe protein-calorie malnutrition: Secondary | ICD-10-CM | POA: Diagnosis not present

## 2021-06-16 DIAGNOSIS — I739 Peripheral vascular disease, unspecified: Secondary | ICD-10-CM | POA: Diagnosis not present

## 2021-06-16 DIAGNOSIS — W19XXXA Unspecified fall, initial encounter: Secondary | ICD-10-CM | POA: Diagnosis not present

## 2021-06-16 DIAGNOSIS — E871 Hypo-osmolality and hyponatremia: Secondary | ICD-10-CM

## 2021-06-16 DIAGNOSIS — S065XAA Traumatic subdural hemorrhage with loss of consciousness status unknown, initial encounter: Secondary | ICD-10-CM | POA: Diagnosis not present

## 2021-06-16 LAB — CBC
HCT: 30.6 % — ABNORMAL LOW (ref 39.0–52.0)
Hemoglobin: 10.4 g/dL — ABNORMAL LOW (ref 13.0–17.0)
MCH: 30.1 pg (ref 26.0–34.0)
MCHC: 34 g/dL (ref 30.0–36.0)
MCV: 88.7 fL (ref 80.0–100.0)
Platelets: 199 10*3/uL (ref 150–400)
RBC: 3.45 MIL/uL — ABNORMAL LOW (ref 4.22–5.81)
RDW: 12.6 % (ref 11.5–15.5)
WBC: 7.9 10*3/uL (ref 4.0–10.5)
nRBC: 0 % (ref 0.0–0.2)

## 2021-06-16 LAB — COMPREHENSIVE METABOLIC PANEL
ALT: 318 U/L — ABNORMAL HIGH (ref 0–44)
AST: 275 U/L — ABNORMAL HIGH (ref 15–41)
Albumin: 3.7 g/dL (ref 3.5–5.0)
Alkaline Phosphatase: 86 U/L (ref 38–126)
Anion gap: 8 (ref 5–15)
BUN: 30 mg/dL — ABNORMAL HIGH (ref 8–23)
CO2: 30 mmol/L (ref 22–32)
Calcium: 9.2 mg/dL (ref 8.9–10.3)
Chloride: 93 mmol/L — ABNORMAL LOW (ref 98–111)
Creatinine, Ser: 0.98 mg/dL (ref 0.61–1.24)
GFR, Estimated: >60 mL/min (ref >60)
Glucose, Bld: 100 mg/dL — ABNORMAL HIGH (ref 70–99)
Potassium: 4 mmol/L (ref 3.5–5.1)
Sodium: 131 mmol/L — ABNORMAL LOW (ref 135–145)
Total Bilirubin: 0.8 mg/dL (ref 0.3–1.2)
Total Protein: 6.8 g/dL (ref 6.5–8.1)

## 2021-06-16 LAB — HEPATITIS PANEL, ACUTE
HCV Ab: NONREACTIVE
Hep A IgM: NONREACTIVE
Hep B C IgM: NONREACTIVE
Hepatitis B Surface Ag: NONREACTIVE

## 2021-06-16 LAB — FERRITIN: Ferritin: 254 ng/mL (ref 24–336)

## 2021-06-16 LAB — GLUCOSE, CAPILLARY: Glucose-Capillary: 100 mg/dL — ABNORMAL HIGH (ref 70–99)

## 2021-06-16 LAB — PHOSPHORUS: Phosphorus: 3.8 mg/dL (ref 2.5–4.6)

## 2021-06-16 LAB — MAGNESIUM: Magnesium: 1.9 mg/dL (ref 1.7–2.4)

## 2021-06-16 MED ORDER — POLYETHYLENE GLYCOL 3350 17 G PO PACK
17.0000 g | PACK | Freq: Two times a day (BID) | ORAL | Status: DC | PRN
  Administered 2021-06-18: 12:00:00 17 g via ORAL
  Filled 2021-06-16: qty 1

## 2021-06-16 MED ORDER — DOCUSATE SODIUM 100 MG PO CAPS
100.0000 mg | ORAL_CAPSULE | Freq: Two times a day (BID) | ORAL | Status: DC
  Administered 2021-06-16 – 2021-06-23 (×13): 100 mg via ORAL
  Filled 2021-06-16 (×14): qty 1

## 2021-06-16 MED ORDER — SENNA 8.6 MG PO TABS
1.0000 | ORAL_TABLET | Freq: Two times a day (BID) | ORAL | Status: DC | PRN
  Administered 2021-06-18 – 2021-06-21 (×2): 8.6 mg via ORAL
  Filled 2021-06-16 (×2): qty 1

## 2021-06-16 NOTE — TOC Progression Note (Signed)
Transition of Care [facility]) - Progression Note    Patient Details  Name: [name] MRN: [id number] Date of Birth: Nov 10, 1955  Transition of Care [facility]) CM/SW [place], [place] Phone Number: 06/16/2021, 11:43 AM  Clinical Narrative:     CSW met with patient and his sister,[name]. CSW introduced self and explained role. CSW discussed therapy recommendation of short term rehab at [facility]. [name] explained patient was getting HH/PT at ALF before being admitted to the hospital. She acknowledges patient can benefit from rehab at [facility] but expressed concerns regarding where the patient would go and if he will be able to return to ALF [facility]). CSW encouraged her to talk with ALF and inform them of patient's disposition plan. 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 CSW explained the SNF process and possible placement barriers. She states understanding. CSW answered all questions.   Patient ahs no bed offers at this time CSW will provide bed offers once available CSW will continue to follow and assist with discharge plan  Thurmond Butts, MSW, LCSW Clinical Social Worker    Expected Discharge Plan: [place] Barriers to Discharge: Continued Medical Work up, SNF Pending bed offer  Expected Discharge Plan and Services Expected Discharge Plan: [place] In-house Referral: Clinical Social Work     Living arrangements for the past 2 months: [place]                                       Social Determinants of Health (SDOH) Interventions    Readmission Risk Interventions No flowsheet data found.

## 2021-06-16 NOTE — NC FL2 (Signed)
[place] LEVEL OF CARE SCREENING TOOL     IDENTIFICATION  Patient Name: [name] Birthdate: March 20, 1956 Sex: male Admission Date (Current Location): 06/14/2021  [facility] and [place] Number:  Herbalist and Address:         Provider Number: [id number]  Attending Physician Name and Address:  Mercy Riding, MD  Relative Name and Phone Number:       Current Level of Care: Hospital Recommended Level of Care: [place] Prior Approval Number:    Date Approved/Denied:   PASRR Number: [id number]  Discharge Plan: SNF    Current Diagnoses: Patient Active Problem List   Diagnosis Date Noted   Protein-calorie malnutrition, severe 06/15/2021   Subdural hematoma 06/14/2021   Lung nodule    AAA (abdominal aortic aneurysm) without rupture 10/21/2012   Thoracic aneurysm without mention of rupture 10/21/2012   Hyperlipidemia 10/18/2012   Poor short term memory 10/18/2012   Depression 10/16/2012   Dry skin 10/16/2012   Urge urinary incontinence 08/26/2012   Lumbosacral radiculopathy at L4 07/30/2012   Hyperextension of knee or lower leg 05/08/2012   Shoulder pain, right 02/16/2012   Healthcare maintenance 02/16/2012   Cardiomyopathy, nonischemic (HCC) 02/14/2012   Short-term memory loss 02/14/2012   Hyponatremia 10/18/2011   Seizures ([place]) 10/18/2011   Hypertension 10/18/2011   TBI (traumatic brain injury) 10/18/2011   H/O alcohol abuse 10/18/2011   Tobacco abuse 10/18/2011    Orientation RESPIRATION BLADDER Height & Weight     Self  Normal External catheter, Continent Weight: 102 lb 1.2 oz (46.3 kg) Height:  5\' 6"  (167.6 cm)  BEHAVIORAL SYMPTOMS/MOOD NEUROLOGICAL BOWEL NUTRITION STATUS        Diet (please see discharge summary)  AMBULATORY STATUS COMMUNICATION OF NEEDS Skin   Limited Assist Verbally Skin abrasions                       Personal Care Assistance Level of Assistance  Bathing, Feeding, Dressing  Bathing Assistance: Limited assistance Feeding assistance: Independent Dressing Assistance: Limited assistance     Functional Limitations Info  Sight, Hearing, Speech Sight Info: Impaired (wears glasses) Hearing Info: Adequate Speech Info: Adequate    SPECIAL CARE FACTORS FREQUENCY  PT (By licensed PT), OT (By licensed OT)     PT Frequency: 5x per week OT Frequency: 5x per week            Contractures Contractures Info: Not present    Additional Factors Info  Code Status, Allergies Code Status Info: Full Allergies Info: Isovue           Current Medications (06/16/2021):  This is the current hospital active medication list Current Facility-Administered Medications  Medication Dose Route Frequency Provider Last Rate Last Admin   acetaminophen (TYLENOL) tablet 650 mg  650 mg Oral Q6H PRN Marylyn Ishihara, Tyrone A, DO       Or   acetaminophen (TYLENOL) suppository 650 mg  650 mg Rectal Q6H PRN Marylyn Ishihara, Tyrone A, DO       busPIRone (BUSPAR) tablet 10 mg  10 mg Oral BID Wendee Beavers T, MD   10 mg at 06/16/21 0825   feeding supplement (ENSURE ENLIVE / ENSURE PLUS) liquid 237 mL  237 mL Oral TID BM Gonfa, Taye T, MD   237 mL at 06/15/21 1947   mirtazapine (REMERON) tablet 15 mg  15 mg Oral QHS Wendee Beavers T, MD   15 mg at 06/15/21 2149   multivitamin (  PROSIGHT) tablet 1 tablet  1 tablet Oral Daily Wendee Beavers T, MD   1 tablet at 06/16/21 0825   ondansetron (ZOFRAN) tablet 4 mg  4 mg Oral Q6H PRN Cherylann Ratel A, DO       Or   ondansetron (ZOFRAN) injection 4 mg  4 mg Intravenous Q6H PRN Marylyn Ishihara, Tyrone A, DO       phenytoin (DILANTIN) ER capsule 260 mg  260 mg Oral QHS Wendee Beavers T, MD   260 mg at 06/15/21 2149   potassium chloride SA (KLOR-CON M) CR tablet 40 mEq  40 mEq Oral Daily Marylyn Ishihara, Tyrone A, DO   40 mEq at 06/16/21 0825   sodium chloride flush (NS) 0.9 % injection 3 mL  3 mL Intravenous Q12H Kyle, Tyrone A, DO   3 mL at 06/16/21 4403     Discharge Medications: Please see discharge  summary for a list of discharge medications.  Relevant Imaging Results:  Relevant Lab Results:   Additional Information SSN [id number]  Vinie Sill, LCSW
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
## 2021-06-16 NOTE — Progress Notes (Signed)
Physical Therapy Treatment Patient Details Name: [name] MRN: [id number] DOB: 01-Jun-1956 Today's Date: 06/16/2021   History of Present Illness 65 y/o male presented to ED on 06/14/21 following unwitnessed fall with R brow laceration. Patient with recent history of 5 falls in past 2 weeks at ALF. CTH showed acute SDH on R with maximal thickness of 11 mm. No neurosurgical intervention required. PMH: TBI in 1988, dementia, HTN, seizures, aortic aneurysm    PT Comments    Patient progressing towards physical therapy goals. Patient pleasantly confused during session. Patient ambulated 150' with RW and min guard. Patient with baseline R LE weakness but seems to demo decreased foot clearance on R resulting in shoe catching floor. Patient is at high risk for recurrent falls. Continue to recommend SNF for ongoing Physical Therapy.       Recommendations for follow up therapy are one component of a multi-disciplinary discharge planning process, led by the attending physician.  Recommendations may be updated based on patient status, additional functional criteria and insurance authorization.  Follow Up Recommendations  Skilled nursing-short term rehab (<3 hours/day)     Assistance Recommended at Discharge Frequent or constant Supervision/Assistance  Equipment Recommendations  Rolling Roland Prine (2 wheels)    Recommendations for Other Services       Precautions / Restrictions Precautions Precautions: Fall Precaution Comments: hx of falls, R AFO and hyperextension brace (place over pants - does not fit without them) Required Braces or Orthoses: Other Brace Other Brace: R AFO and hyperextension brace Restrictions Weight Bearing Restrictions: No     Mobility  Bed Mobility Overal bed mobility: Needs Assistance Bed Mobility: Supine to Sit     Supine to sit: Min assist     General bed mobility comments: minA for trunk elevation to EOB    Transfers Overall transfer level: Needs  assistance Equipment used: Rolling Davanee Klinkner (2 wheels) Transfers: Sit to/from Stand Sit to Stand: Min assist           General transfer comment: minA for boost up and steady. Initial posterior lean    Ambulation/Gait Ambulation/Gait assistance: Min guard Gait Distance (Feet): 150 Feet Assistive device: Rolling Jeanann Balinski (2 wheels) Gait Pattern/deviations: Step-to pattern;Decreased stride length Gait velocity: decreased     General Gait Details: min guard for safety. Patient at times catching R shoe on ground resulting in shuffle step to regain balance. This may be due to R LE weakness   Stairs             Wheelchair Mobility    Modified Rankin (Stroke Patients Only)       Balance Overall balance assessment: Needs assistance Sitting-balance support: No upper extremity supported;Feet supported Sitting balance-Leahy Scale: Fair     Standing balance support: Bilateral upper extremity supported;During functional activity;Reliant on assistive device for balance Standing balance-Leahy Scale: Poor Standing balance comment: reliant on UE support                            Cognition Arousal/Alertness: Awake/alert Behavior During Therapy: WFL for tasks assessed/performed Overall Cognitive Status: History of cognitive impairments - at baseline                                 General Comments: appropriate and following commands. Pleasantly confused. Able to state he was at [place] but unaware of situation. Patient repeatedly stating "I need to take it slow  and easy" during ambulation        Exercises      General Comments        Pertinent Vitals/Pain Pain Assessment: No/denies pain    Home Living                          Prior Function            PT Goals (current goals can now be found in the care plan section) Acute Rehab PT Goals Patient Stated Goal: to stop falling PT Goal Formulation: With family Time For Goal  Achievement: 06/29/21 Potential to Achieve Goals: Fair Progress towards PT goals: Progressing toward goals    Frequency    Min 3X/week      PT Plan Current plan remains appropriate    Co-evaluation              AM-PAC PT "6 Clicks" Mobility   Outcome Measure  Help needed turning from your back to your side while in a flat bed without using bedrails?: A Little Help needed moving from lying on your back to sitting on the side of a flat bed without using bedrails?: A Little Help needed moving to and from a bed to a chair (including a wheelchair)?: A Little Help needed standing up from a chair using your arms (e.g., wheelchair or bedside chair)?: A Little Help needed to walk in hospital room?: A Little Help needed climbing 3-5 steps with a railing? : Total 6 Click Score: 16    End of Session Equipment Utilized During Treatment: Gait belt;Other (comment) (R AFO and knee hyperextension brace) Activity Tolerance: Patient tolerated treatment well Patient left: in chair;with call bell/phone within reach;with chair alarm set;with family/visitor present Nurse Communication: Mobility status PT Visit Diagnosis: Muscle weakness (generalized) (M62.81);History of falling (Z91.81);Other abnormalities of gait and mobility (R26.89);Unsteadiness on feet (R26.81)     Time: 9211-9417 PT Time Calculation (min) (ACUTE ONLY): 29 min  Charges:  $Gait Training: 23-37 mins                     Madelena Maturin A. Gilford Rile PT, DPT Acute Rehabilitation Services Pager [phone] Office [phone]    Linna Hoff 06/16/2021, 12:57 PM

## 2021-06-16 NOTE — Progress Notes (Signed)
Pt made NPO for ultrasound.   Justice Rocher, RN
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
## 2021-06-17 ENCOUNTER — Inpatient Hospital Stay (HOSPITAL_COMMUNITY): Payer: Medicaid Other

## 2021-06-17 DIAGNOSIS — I739 Peripheral vascular disease, unspecified: Secondary | ICD-10-CM | POA: Diagnosis not present

## 2021-06-17 DIAGNOSIS — W19XXXA Unspecified fall, initial encounter: Secondary | ICD-10-CM | POA: Diagnosis not present

## 2021-06-17 DIAGNOSIS — E43 Unspecified severe protein-calorie malnutrition: Secondary | ICD-10-CM | POA: Diagnosis not present

## 2021-06-17 DIAGNOSIS — S065XAA Traumatic subdural hemorrhage with loss of consciousness status unknown, initial encounter: Secondary | ICD-10-CM | POA: Diagnosis not present

## 2021-06-17 LAB — COMPREHENSIVE METABOLIC PANEL
ALT: 213 U/L — ABNORMAL HIGH (ref 0–44)
AST: 125 U/L — ABNORMAL HIGH (ref 15–41)
Albumin: 3.9 g/dL (ref 3.5–5.0)
Alkaline Phosphatase: 77 U/L (ref 38–126)
Anion gap: 9 (ref 5–15)
BUN: 29 mg/dL — ABNORMAL HIGH (ref 8–23)
CO2: 31 mmol/L (ref 22–32)
Calcium: 9.3 mg/dL (ref 8.9–10.3)
Chloride: 95 mmol/L — ABNORMAL LOW (ref 98–111)
Creatinine, Ser: 0.89 mg/dL (ref 0.61–1.24)
GFR, Estimated: >60 mL/min (ref >60)
Glucose, Bld: 123 mg/dL — ABNORMAL HIGH (ref 70–99)
Potassium: 3.5 mmol/L (ref 3.5–5.1)
Sodium: 135 mmol/L (ref 135–145)
Total Bilirubin: 0.8 mg/dL (ref 0.3–1.2)
Total Protein: 6.8 g/dL (ref 6.5–8.1)

## 2021-06-17 LAB — CBC
HCT: 31.2 % — ABNORMAL LOW (ref 39.0–52.0)
Hemoglobin: 10.4 g/dL — ABNORMAL LOW (ref 13.0–17.0)
MCH: 29.8 pg (ref 26.0–34.0)
MCHC: 33.3 g/dL (ref 30.0–36.0)
MCV: 89.4 fL (ref 80.0–100.0)
Platelets: 215 10*3/uL (ref 150–400)
RBC: 3.49 MIL/uL — ABNORMAL LOW (ref 4.22–5.81)
RDW: 12.6 % (ref 11.5–15.5)
WBC: 6.4 10*3/uL (ref 4.0–10.5)
nRBC: 0 % (ref 0.0–0.2)

## 2021-06-17 LAB — MAGNESIUM: Magnesium: 2 mg/dL (ref 1.7–2.4)

## 2021-06-17 LAB — PHOSPHORUS: Phosphorus: 3.8 mg/dL (ref 2.5–4.6)

## 2021-06-17 LAB — GLUCOSE, CAPILLARY: Glucose-Capillary: 92 mg/dL (ref 70–99)

## 2021-06-17 MED ORDER — POTASSIUM CHLORIDE CRYS ER 20 MEQ PO TBCR
40.0000 meq | EXTENDED_RELEASE_TABLET | Freq: Once | ORAL | Status: AC
  Administered 2021-06-17: 14:00:00 40 meq via ORAL
  Filled 2021-06-17: qty 2
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
## 2021-06-18 ENCOUNTER — Inpatient Hospital Stay (HOSPITAL_COMMUNITY): Payer: Medicaid Other

## 2021-06-18 DIAGNOSIS — S065XAA Traumatic subdural hemorrhage with loss of consciousness status unknown, initial encounter: Secondary | ICD-10-CM | POA: Diagnosis not present

## 2021-06-18 LAB — GLUCOSE, CAPILLARY
Glucose-Capillary: 95 mg/dL (ref 70–99)
Glucose-Capillary: 91 mg/dL (ref 70–99)

## 2021-06-18 LAB — URINALYSIS, ROUTINE W REFLEX MICROSCOPIC
Bilirubin Urine: NEGATIVE
Glucose, UA: NEGATIVE mg/dL
Hgb urine dipstick: NEGATIVE
Ketones, ur: NEGATIVE mg/dL
Leukocytes,Ua: NEGATIVE
Nitrite: NEGATIVE
Protein, ur: NEGATIVE mg/dL
Specific Gravity, Urine: 1.015 (ref 1.005–1.030)
pH: 6.5 (ref 5.0–8.0)

## 2021-06-18 LAB — COMPREHENSIVE METABOLIC PANEL
ALT: 162 U/L — ABNORMAL HIGH (ref 0–44)
AST: 84 U/L — ABNORMAL HIGH (ref 15–41)
Albumin: 3.8 g/dL (ref 3.5–5.0)
Alkaline Phosphatase: 84 U/L (ref 38–126)
Anion gap: 6 (ref 5–15)
BUN: 33 mg/dL — ABNORMAL HIGH (ref 8–23)
CO2: 31 mmol/L (ref 22–32)
Calcium: 9.1 mg/dL (ref 8.9–10.3)
Chloride: 97 mmol/L — ABNORMAL LOW (ref 98–111)
Creatinine, Ser: 0.88 mg/dL (ref 0.61–1.24)
GFR, Estimated: >60 mL/min (ref >60)
Glucose, Bld: 94 mg/dL (ref 70–99)
Potassium: 4.2 mmol/L (ref 3.5–5.1)
Sodium: 134 mmol/L — ABNORMAL LOW (ref 135–145)
Total Bilirubin: 0.8 mg/dL (ref 0.3–1.2)
Total Protein: 6.7 g/dL (ref 6.5–8.1)

## 2021-06-18 LAB — HEMOGLOBIN AND HEMATOCRIT, BLOOD
HCT: 31.1 % — ABNORMAL LOW (ref 39.0–52.0)
Hemoglobin: 10.4 g/dL — ABNORMAL LOW (ref 13.0–17.0)

## 2021-06-18 LAB — MAGNESIUM: Magnesium: 2 mg/dL (ref 1.7–2.4)

## 2021-06-18 LAB — PHOSPHORUS: Phosphorus: 4 mg/dL (ref 2.5–4.6)
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
## 2021-06-19 DIAGNOSIS — S065XAA Traumatic subdural hemorrhage with loss of consciousness status unknown, initial encounter: Secondary | ICD-10-CM | POA: Diagnosis not present

## 2021-06-19 LAB — COMPREHENSIVE METABOLIC PANEL
ALT: 126 U/L — ABNORMAL HIGH (ref 0–44)
AST: 59 U/L — ABNORMAL HIGH (ref 15–41)
Albumin: 3.8 g/dL (ref 3.5–5.0)
Alkaline Phosphatase: 92 U/L (ref 38–126)
Anion gap: 9 (ref 5–15)
BUN: 33 mg/dL — ABNORMAL HIGH (ref 8–23)
CO2: 31 mmol/L (ref 22–32)
Calcium: 9.3 mg/dL (ref 8.9–10.3)
Chloride: 94 mmol/L — ABNORMAL LOW (ref 98–111)
Creatinine, Ser: 0.9 mg/dL (ref 0.61–1.24)
GFR, Estimated: >60 mL/min (ref >60)
Glucose, Bld: 97 mg/dL (ref 70–99)
Potassium: 3.9 mmol/L (ref 3.5–5.1)
Sodium: 134 mmol/L — ABNORMAL LOW (ref 135–145)
Total Bilirubin: 0.9 mg/dL (ref 0.3–1.2)
Total Protein: 6.8 g/dL (ref 6.5–8.1)

## 2021-06-19 LAB — CK: Total CK: 181 U/L (ref 49–397)

## 2021-06-19 LAB — PHENYTOIN LEVEL, TOTAL: Phenytoin Lvl: 13.6 ug/mL (ref 10.0–20.0)

## 2021-06-19 LAB — SEDIMENTATION RATE: Sed Rate: 17 mm/hr — ABNORMAL HIGH (ref 0–16)

## 2021-06-19 LAB — GLUCOSE, CAPILLARY: Glucose-Capillary: 84 mg/dL (ref 70–99)

## 2021-06-19 LAB — C-REACTIVE PROTEIN: CRP: 1.8 mg/dL — ABNORMAL HIGH (ref <1.0)

## 2021-06-19 MED ORDER — DIPHENHYDRAMINE HCL 25 MG PO CAPS
50.0000 mg | ORAL_CAPSULE | Freq: Once | ORAL | Status: AC
  Administered 2021-06-20: 06:00:00 50 mg via ORAL
  Filled 2021-06-19 (×2): qty 2

## 2021-06-19 MED ORDER — PREDNISONE 50 MG PO TABS: 50.0000 mg | ORAL_TABLET | Freq: Every day | ORAL | Status: DC

## 2021-06-19 MED ORDER — PREDNISONE 50 MG PO TABS
50.0000 mg | ORAL_TABLET | Freq: Once | ORAL | Status: AC
  Administered 2021-06-20: 06:00:00 50 mg via ORAL
  Filled 2021-06-19: qty 1

## 2021-06-19 MED ORDER — MUPIROCIN 2 % EX OINT
TOPICAL_OINTMENT | Freq: Four times a day (QID) | CUTANEOUS | Status: DC
  Administered 2021-06-19 – 2021-06-23 (×11): 1 via TOPICAL
  Filled 2021-06-19 (×7): qty 22

## 2021-06-19 MED ORDER — PREDNISONE 50 MG PO TABS
50.0000 mg | ORAL_TABLET | Freq: Once | ORAL | Status: AC
  Administered 2021-06-19: 19:00:00 50 mg via ORAL
  Filled 2021-06-19: qty 1

## 2021-06-19 MED ORDER — PREDNISONE 50 MG PO TABS
50.0000 mg | ORAL_TABLET | Freq: Once | ORAL | Status: AC
  Administered 2021-06-20: 50 mg via ORAL
  Filled 2021-06-19: qty 1

## 2021-06-19 MED ORDER — DIPHENHYDRAMINE HCL 25 MG PO CAPS: 50.0000 mg | ORAL_CAPSULE | Freq: Once | ORAL | Status: DC

## 2021-06-19 NOTE — TOC Progression Note (Addendum)
Transition of Care [facility]) - Progression Note    Patient Details  Name: [name] MRN: [id number] Date of Birth: 1955-11-30  Transition of Care [facility]) CM/SW Contact  Joanne Chars, [place] Phone Number: 06/19/2021, 3:08 PM  Clinical Narrative:   CSW spoke with pt regarding SNF vs return to [facility].  Pt stating he would like to go to SNF, wants to be able to make progress faster with more PT sessions.  CSW attempted to contact sister [name] regarding her conversation about PT services at [facility] all three numbers,unable to reach her.  Left message.  Referral resent out to [facility] facilities that have not yet responded.    1605: TC from sister [name].  She did speak to [facility], pt can return there after SNF.  She would like to continue to pursue SNF but only in [place].  CSW informed her of no current bed offers but she declined to widen the search.  If no SNF bed is available in [place], will look to return to [facility] with PT there.     Expected Discharge Plan: [place] Barriers to Discharge: Continued Medical Work up, SNF Pending bed offer  Expected Discharge Plan and Services Expected Discharge Plan: [place] In-house Referral: Clinical Social Work     Living arrangements for the past 2 months: [place]                                       Social Determinants of Health (SDOH) Interventions    Readmission Risk Interventions No flowsheet data found.

## 2021-06-19 NOTE — Progress Notes (Signed)
Physical Therapy Treatment Patient Details Name: [name] MRN: [id number] DOB: [birth year] Today's Date: 06/19/2021   History of Present Illness 65 y/o male presented to ED on 06/14/21 following unwitnessed fall with R brow laceration. Patient with recent history of 5 falls in past 2 weeks at ALF. CTH showed acute SDH on R with maximal thickness of 11 mm. No neurosurgical intervention required. PMH: TBI in 1988, dementia, HTN, seizures, aortic aneurysm    PT Comments    Pt with improved memory and orientation to place and situation. Pt perseverating on L heel pain, point tender reporting "it fells like I stepped on a nail". No puncture noted, mild redness compared to R heel present. Pt able to don braces and pants with min guard. Pt with bouts of improved gait pattern compared to previous sessions however majority of time with limited R foot clearance. Pt remains at increased falls risk. Acute PT to cont to follow.    Recommendations for follow up therapy are one component of a multi-disciplinary discharge planning process, led by the attending physician.  Recommendations may be updated based on patient status, additional functional criteria and insurance authorization.  Follow Up Recommendations  Skilled nursing-short term rehab (<3 hours/day)     Assistance Recommended at Discharge Frequent or constant Supervision/Assistance  Equipment Recommendations  Rolling walker (2 wheels)    Recommendations for Other Services       Precautions / Restrictions Precautions Precautions: Fall Precaution Comments: hx of falls, R AFO and hyperextension brace (place over pants - does not fit without them) Required Braces or Orthoses: Other Brace Other Brace: R AFO and hyperextension brace Restrictions Weight Bearing Restrictions: No     Mobility  Bed Mobility Overal bed mobility: Needs Assistance Bed Mobility: Supine to Sit     Supine to sit: Supervision     General bed mobility  comments: increased time, pt with tangetial speech, easily distracted    Transfers Overall transfer level: Needs assistance Equipment used: Rolling walker (2 wheels) Transfers: Sit to/from Stand Sit to Stand: Min assist           General transfer comment: minA for boost up and steady. Initial posterior lean    Ambulation/Gait Ambulation/Gait assistance: Min guard Gait Distance (Feet): 120 Feet Assistive device: Rolling walker (2 wheels) Gait Pattern/deviations: Step-to pattern;Decreased stride length Gait velocity: decreased Gait velocity interpretation: <1.8 ft/sec, indicate of risk for recurrent falls   General Gait Details: pt with bouts of sequential gait patternwith R foot clearance but then also had several bouts of scuffing R foot/limited foot clearance and step length   Stairs             Wheelchair Mobility    Modified Rankin (Stroke Patients Only) Modified Rankin (Stroke Patients Only) Pre-Morbid Rankin Score: Moderate disability Modified Rankin: Moderate disability     Balance Overall balance assessment: Needs assistance Sitting-balance support: No upper extremity supported;Feet supported Sitting balance-Leahy Scale: Fair Sitting balance - Comments: pt able to don socks and braces w/o assist   Standing balance support: Bilateral upper extremity supported;During functional activity;Reliant on assistive device for balance Standing balance-Leahy Scale: Poor Standing balance comment: pt stood x 3 min to don pants bracing self on bed via back of LEs                            Cognition Arousal/Alertness: Awake/alert Behavior During Therapy: WFL for tasks assessed/performed Overall Cognitive Status: History of cognitive impairments -  at baseline                                 General Comments: pt mildly confused, pt is aware he is in the hospital and asked why then immeadiately recalled "oh yeah they said i fell" pt  perseverating on "I'm so sick of being sick"        Exercises      General Comments General comments (skin integrity, edema, etc.): VSS      Pertinent Vitals/Pain Pain Assessment: Faces Faces Pain Scale: Hurts even more Pain Location: L heal, point tenderness Pain Descriptors / Indicators:  (feels)    Home Living                          Prior Function            PT Goals (current goals can now be found in the care plan section) Acute Rehab PT Goals PT Goal Formulation: With family Time For Goal Achievement: 06/29/21 Potential to Achieve Goals: Fair Progress towards PT goals: Progressing toward goals    Frequency    Min 3X/week      PT Plan Current plan remains appropriate    Co-evaluation              AM-PAC PT "6 Clicks" Mobility   Outcome Measure  Help needed turning from your back to your side while in a flat bed without using bedrails?: A Little Help needed moving from lying on your back to sitting on the side of a flat bed without using bedrails?: A Little Help needed moving to and from a bed to a chair (including a wheelchair)?: A Little Help needed standing up from a chair using your arms (e.g., wheelchair or bedside chair)?: A Little Help needed to walk in hospital room?: A Little Help needed climbing 3-5 steps with a railing? : Total 6 Click Score: 16    End of Session Equipment Utilized During Treatment: Gait belt;Other (comment) (R AFO and knee hyperextension brace) Activity Tolerance: Patient tolerated treatment well Patient left: in chair;with call bell/phone within reach;with chair alarm set;with family/visitor present Nurse Communication: Mobility status PT Visit Diagnosis: Muscle weakness (generalized) (M62.81);History of falling (Z91.81);Other abnormalities of gait and mobility (R26.89);Unsteadiness on feet (R26.81)     Time: 1200-1229 PT Time Calculation (min) (ACUTE ONLY): 29 min  Charges:  $Gait Training: 8-22  mins $Therapeutic Activity: 8-22 mins                     Kittie Plater, PT, DPT Acute Rehabilitation Services Pager #: [phone] Office #: [phone]    Berline Lopes 06/19/2021, 12:48 PM
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
## 2021-06-20 ENCOUNTER — Inpatient Hospital Stay (HOSPITAL_COMMUNITY): Payer: Medicaid Other

## 2021-06-20 DIAGNOSIS — I10 Essential (primary) hypertension: Secondary | ICD-10-CM

## 2021-06-20 DIAGNOSIS — S065X0A Traumatic subdural hemorrhage without loss of consciousness, initial encounter: Secondary | ICD-10-CM

## 2021-06-20 DIAGNOSIS — Z7982 Long term (current) use of aspirin: Secondary | ICD-10-CM

## 2021-06-20 DIAGNOSIS — Z79899 Other long term (current) drug therapy: Secondary | ICD-10-CM

## 2021-06-20 DIAGNOSIS — Z7902 Long term (current) use of antithrombotics/antiplatelets: Secondary | ICD-10-CM

## 2021-06-20 DIAGNOSIS — I70203 Unspecified atherosclerosis of native arteries of extremities, bilateral legs: Secondary | ICD-10-CM

## 2021-06-20 DIAGNOSIS — S065XAA Traumatic subdural hemorrhage with loss of consciousness status unknown, initial encounter: Secondary | ICD-10-CM | POA: Diagnosis not present

## 2021-06-20 DIAGNOSIS — E785 Hyperlipidemia, unspecified: Secondary | ICD-10-CM

## 2021-06-20 DIAGNOSIS — I7123 Aneurysm of the descending thoracic aorta, without rupture: Secondary | ICD-10-CM

## 2021-06-20 DIAGNOSIS — M79675 Pain in left toe(s): Secondary | ICD-10-CM

## 2021-06-20 DIAGNOSIS — Z95828 Presence of other vascular implants and grafts: Secondary | ICD-10-CM

## 2021-06-20 LAB — CBC
HCT: 31.7 % — ABNORMAL LOW (ref 39.0–52.0)
Hemoglobin: 10.6 g/dL — ABNORMAL LOW (ref 13.0–17.0)
MCH: 30.2 pg (ref 26.0–34.0)
MCHC: 33.4 g/dL (ref 30.0–36.0)
MCV: 90.3 fL (ref 80.0–100.0)
Platelets: 230 10*3/uL (ref 150–400)
RBC: 3.51 MIL/uL — ABNORMAL LOW (ref 4.22–5.81)
RDW: 13.1 % (ref 11.5–15.5)
WBC: 7.7 10*3/uL (ref 4.0–10.5)
nRBC: 0 % (ref 0.0–0.2)

## 2021-06-20 LAB — COMPREHENSIVE METABOLIC PANEL
ALT: 96 U/L — ABNORMAL HIGH (ref 0–44)
AST: 45 U/L — ABNORMAL HIGH (ref 15–41)
Albumin: 3.7 g/dL (ref 3.5–5.0)
Alkaline Phosphatase: 90 U/L (ref 38–126)
Anion gap: 10 (ref 5–15)
BUN: 34 mg/dL — ABNORMAL HIGH (ref 8–23)
CO2: 28 mmol/L (ref 22–32)
Calcium: 9.2 mg/dL (ref 8.9–10.3)
Chloride: 96 mmol/L — ABNORMAL LOW (ref 98–111)
Creatinine, Ser: 0.85 mg/dL (ref 0.61–1.24)
GFR, Estimated: >60 mL/min (ref >60)
Glucose, Bld: 153 mg/dL — ABNORMAL HIGH (ref 70–99)
Potassium: 4.2 mmol/L (ref 3.5–5.1)
Sodium: 134 mmol/L — ABNORMAL LOW (ref 135–145)
Total Bilirubin: 0.8 mg/dL (ref 0.3–1.2)
Total Protein: 6.8 g/dL (ref 6.5–8.1)

## 2021-06-20 MED ORDER — CEPHALEXIN 500 MG PO CAPS
500.0000 mg | ORAL_CAPSULE | Freq: Two times a day (BID) | ORAL | Status: DC
  Administered 2021-06-20 – 2021-06-24 (×8): 500 mg via ORAL
  Filled 2021-06-20 (×8): qty 1

## 2021-06-20 MED ORDER — IOHEXOL 350 MG/ML SOLN
100.0000 mL | Freq: Once | INTRAVENOUS | Status: AC | PRN
  Administered 2021-06-20: 08:00:00 100 mL via INTRAVENOUS
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
## 2021-06-20 NOTE — Progress Notes (Signed)
Occupational Therapy Treatment Patient Details Name: [name] MRN: [id number] DOB: Oct 08, 1955 Today's Date: 06/20/2021   History of present illness 65 y/o male presented to ED on 06/14/21 following unwitnessed fall with R brow laceration. Patient with recent history of 5 falls in past 2 weeks at ALF. CTH showed acute SDH on R with maximal thickness of 11 mm. No neurosurgical intervention required. PMH: TBI in 1988, dementia, HTN, seizures, aortic aneurysm   OT comments  Pt needed mod cues throughout session to sequence LB dressing in a seated position with all adl items present. Pt with poor recall of reason for admission. Pt pleasant and thanking therapist for coming by to see him. Recommendation for SNF.    Recommendations for follow up therapy are one component of a multi-disciplinary discharge planning process, led by the attending physician.  Recommendations may be updated based on patient status, additional functional criteria and insurance authorization.    Follow Up Recommendations  Skilled nursing-short term rehab (<3 hours/day)    Assistance Recommended at Discharge Frequent or constant Supervision/Assistance  Equipment Recommendations  Other (comment)    Recommendations for Other Services      Precautions / Restrictions Precautions Precautions: Fall Precaution Comments: hx of falls, R AFO and hyperextension brace (place over pants - does not fit without them) Required Braces or Orthoses: Other Brace Other Brace: R AFO and hyperextension brace       Mobility Bed Mobility               General bed mobility comments: oob on arrival    Transfers Overall transfer level: Needs assistance   Transfers: Sit to/from Stand Sit to Stand: Mod assist           General transfer comment: reliant on UE use on arm rest     Balance           Standing balance support: Reliant on assistive device for balance Standing balance-Leahy Scale: Poor                              ADL either performed or assessed with clinical judgement   ADL Overall ADL's : Needs assistance/impaired                     Lower Body Dressing: Moderate assistance;Sit to/from stand Lower Body Dressing Details (indicate cue type and reason): doff socks, don socks, don afo, don shoes. pt needs mod cues throughout. pt max (A) to don afo into shoe as pt wanted to don afo first Toilet Transfer: Moderate assistance                  Extremity/Trunk Assessment              Vision       Perception     Praxis      Cognition Arousal/Alertness: Awake/alert Behavior During Therapy: WFL for tasks assessed/performed Overall Cognitive Status: History of cognitive impairments - at baseline                                 General Comments: unaware of where to don AFO on R LE and instead attempting to place on L LE. pt needs redirection during session.          Exercises     Shoulder Instructions       General Comments VSS  Pertinent Vitals/ Pain       Pain Assessment: No/denies pain  Home Living                                          Prior Functioning/Environment              Frequency  Min 2X/week        Progress Toward Goals  OT Goals(current goals can now be found in the care plan section)  Progress towards OT goals: Progressing toward goals  Acute Rehab OT Goals Patient Stated Goal: to go back to SNF OT Goal Formulation: With patient/family Time For Goal Achievement: 06/29/21 Potential to Achieve Goals: Good ADL Goals Pt Will Perform Grooming: with supervision;standing Pt Will Perform Upper Body Bathing: with supervision;sitting;standing Pt Will Perform Lower Body Bathing: with supervision;sit to/from stand Pt Will Perform Upper Body Dressing: with supervision;sitting;standing Pt Will Perform Lower Body Dressing: with supervision;sit to/from stand Pt Will Transfer to  Toilet: with supervision;ambulating;grab bars Pt Will Perform Toileting - Clothing Manipulation and hygiene: with supervision;sit to/from stand Additional ADL Goal #1: Pt will be Mod I in and OOB for basic ADLs (no rail, HOB flat, extra time)  Plan Discharge plan remains appropriate    Co-evaluation                 AM-PAC OT "6 Clicks" Daily Activity     Outcome Measure   Help from another person eating meals?: None Help from another person taking care of personal grooming?: A Little Help from another person toileting, which includes using toliet, bedpan, or urinal?: A Little Help from another person bathing (including washing, rinsing, drying)?: A Lot Help from another person to put on and taking off regular upper body clothing?: A Little Help from another person to put on and taking off regular lower body clothing?: A Lot 6 Click Score: 17    End of Session Equipment Utilized During Treatment: Gait belt  OT Visit Diagnosis: Unsteadiness on feet (R26.81);Other abnormalities of gait and mobility (R26.89);History of falling (Z91.81)   Activity Tolerance Patient tolerated treatment well   Patient Left in chair;with call bell/phone within reach;with chair alarm set   Nurse Communication Mobility status;Precautions        Time: 1202 (1202)-1218 OT Time Calculation (min): 16 min  Charges: OT General Charges $OT Visit: 1 Visit OT Treatments $Self Care/Home Management : 8-22 mins   [name], OTR/L  Acute Rehabilitation Services Pager: [phone] Office: [phone] .   Jeri Modena 06/20/2021, 2:40 PM
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
## 2021-06-21 ENCOUNTER — Inpatient Hospital Stay (HOSPITAL_COMMUNITY): Payer: Medicaid Other

## 2021-06-21 ENCOUNTER — Encounter (HOSPITAL_COMMUNITY)
Admission: EM | Disposition: A | Discharge status: Home Health | Payer: Self-pay | Source: Skilled Nursing Facility | Attending: Family Medicine

## 2021-06-21 DIAGNOSIS — S065XAA Traumatic subdural hemorrhage with loss of consciousness status unknown, initial encounter: Secondary | ICD-10-CM | POA: Diagnosis not present

## 2021-06-21 DIAGNOSIS — I739 Peripheral vascular disease, unspecified: Secondary | ICD-10-CM | POA: Diagnosis not present

## 2021-06-21 DIAGNOSIS — M79675 Pain in left toe(s): Secondary | ICD-10-CM | POA: Diagnosis not present

## 2021-06-21 LAB — COMPREHENSIVE METABOLIC PANEL
ALT: 76 U/L — ABNORMAL HIGH (ref 0–44)
AST: 37 U/L (ref 15–41)
Albumin: 3.7 g/dL (ref 3.5–5.0)
Alkaline Phosphatase: 84 U/L (ref 38–126)
Anion gap: 12 (ref 5–15)
BUN: 31 mg/dL — ABNORMAL HIGH (ref 8–23)
CO2: 30 mmol/L (ref 22–32)
Calcium: 9.1 mg/dL (ref 8.9–10.3)
Chloride: 96 mmol/L — ABNORMAL LOW (ref 98–111)
Creatinine, Ser: 0.77 mg/dL (ref 0.61–1.24)
GFR, Estimated: >60 mL/min (ref >60)
Glucose, Bld: 90 mg/dL (ref 70–99)
Potassium: 3.6 mmol/L (ref 3.5–5.1)
Sodium: 138 mmol/L (ref 135–145)
Total Bilirubin: 0.6 mg/dL (ref 0.3–1.2)
Total Protein: 6.8 g/dL (ref 6.5–8.1)

## 2021-06-21 LAB — CBC
HCT: 29.4 % — ABNORMAL LOW (ref 39.0–52.0)
Hemoglobin: 9.6 g/dL — ABNORMAL LOW (ref 13.0–17.0)
MCH: 29.6 pg (ref 26.0–34.0)
MCHC: 32.7 g/dL (ref 30.0–36.0)
MCV: 90.7 fL (ref 80.0–100.0)
Platelets: 251 10*3/uL (ref 150–400)
RBC: 3.24 MIL/uL — ABNORMAL LOW (ref 4.22–5.81)
RDW: 13.4 % (ref 11.5–15.5)
WBC: 9 10*3/uL (ref 4.0–10.5)
nRBC: 0 % (ref 0.0–0.2)

## 2021-06-21 SURGERY — ABDOMINAL AORTOGRAM W/LOWER EXTREMITY
Anesthesia: LOCAL

## 2021-06-21 NOTE — Progress Notes (Signed)
Physical Therapy Treatment Patient Details Name: [name] MRN: [id number] DOB: 03-26-1956 Today's Date: 06/21/2021   History of Present Illness 65 y/o male presented to ED on 06/14/21 following unwitnessed fall with R brow laceration. Patient with recent history of 5 falls in past 2 weeks at ALF. CTH showed acute SDH on R with maximal thickness of 11 mm. No neurosurgical intervention required. PMH: TBI in 1988, dementia, HTN, seizures, aortic aneurysm    PT Comments    Patient requires more assist to don pants and braces this session. Patient requires frequent reorientation of place and situation throughout session. Patient ambulating 150' with RW and min guard-close supervision. Patient with no overt LOB. Patient following commands appropriately. Continue to recommend SNF for ongoing Physical Therapy.  If ALF able to provide necessary supervision, patient may be able to discharge back to ALF with HHPT.     Recommendations for follow up therapy are one component of a multi-disciplinary discharge planning process, led by the attending physician.  Recommendations may be updated based on patient status, additional functional criteria and insurance authorization.  Follow Up Recommendations  Skilled nursing-short term rehab (<3 hours/day)     Assistance Recommended at Discharge Frequent or constant Supervision/Assistance  Equipment Recommendations  Rolling [name] (2 wheels)    Recommendations for Other Services       Precautions / Restrictions Precautions Precautions: Fall Precaution Comments: hx of falls, R AFO and hyperextension brace (place over pants - does not fit without them) Required Braces or Orthoses: Other Brace Other Brace: R AFO and hyperextension brace Restrictions Weight Bearing Restrictions: No     Mobility  Bed Mobility               General bed mobility comments: in recliner on arrival    Transfers Overall transfer level: Needs  assistance Equipment used: Rolling [name] (2 wheels) Transfers: Sit to/from Stand Sit to Stand: Min guard           General transfer comment: increased time and effort to complete. Sit to stand x 4 during session    Ambulation/Gait Ambulation/Gait assistance: Min guard;Supervision Gait Distance (Feet): 150 Feet Assistive device: Rolling [name] (2 wheels) Gait Pattern/deviations: Step-to pattern;Decreased stride length Gait velocity: decreased     General Gait Details: improved gait quality with better R foot clearance. Min guard to close supervision during mobility.   Stairs             Wheelchair Mobility    [name] (Stroke Patients Only) [name] (Stroke Patients Only) Pre-Morbid [name] Score: Moderate disability [name]: Moderately severe disability     Balance Overall balance assessment: Needs assistance Sitting-balance support: No upper extremity supported;Feet supported Sitting balance-[name]: Fair     Standing balance support: Bilateral upper extremity supported;Reliant on assistive device for balance Standing balance-[name]: Poor                              Cognition Arousal/Alertness: Awake/alert Behavior During Therapy: WFL for tasks assessed/performed Overall Cognitive Status: History of cognitive impairments - at baseline                                 General Comments: needs assist donning AFO. Requires frequent reorientation during session        Exercises      General Comments  Pertinent Vitals/Pain Pain Assessment: No/denies pain    Home Living                          Prior Function            PT Goals (current goals can now be found in the care plan section) Acute Rehab PT Goals Patient Stated Goal: to stop falling PT Goal Formulation: With family Time For Goal Achievement: 06/29/21 Potential to Achieve Goals: Fair Progress towards PT goals:  Progressing toward goals    Frequency    Min 3X/week      PT Plan Current plan remains appropriate    Co-evaluation              AM-PAC PT "6 Clicks" Mobility   Outcome Measure  Help needed turning from your back to your side while in a flat bed without using bedrails?: A Little Help needed moving from lying on your back to sitting on the side of a flat bed without using bedrails?: A Little Help needed moving to and from a bed to a chair (including a wheelchair)?: A Little Help needed standing up from a chair using your arms (e.g., wheelchair or bedside chair)?: A Little Help needed to walk in hospital room?: A Little Help needed climbing 3-5 steps with a railing? : Total 6 Click Score: 16    End of Session Equipment Utilized During Treatment: Gait belt;Other (comment) (R AFO and knee hyperextension brace) Activity Tolerance: Patient tolerated treatment well Patient left: in chair;with call bell/phone within reach;with chair alarm set Nurse Communication: Mobility status PT Visit Diagnosis: Muscle weakness (generalized) (M62.81);History of falling (Z91.81);Other abnormalities of gait and mobility (R26.89);Unsteadiness on feet (R26.81)     Time: 7673-4193 PT Time Calculation (min) (ACUTE ONLY): 25 min  Charges:  $Gait Training: 23-37 mins                     Akilah Cureton A. Gilford Rile PT, DPT Acute Rehabilitation Services Pager [phone] Office [phone]    Linna Hoff 06/21/2021, 3:34 PM
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
## 2021-06-21 NOTE — Progress Notes (Signed)
ABI's have been completed. Preliminary results can be found in CV Proc through chart review.   06/21/21 12:42 PM [name] RVT

## 2021-06-21 NOTE — Progress Notes (Signed)
Nutrition Follow-up  DOCUMENTATION CODES:   Underweight, Severe malnutrition in context of chronic illness  INTERVENTION:   - Continue Ensure Enlive po TID, each supplement provides 350 kcal and 20 grams of protein   - Continue MVI daily   - Encourage PO intake  NUTRITION DIAGNOSIS:   Severe Malnutrition related to chronic illness (TBI) as evidenced by severe muscle depletion, severe fat depletion.  Ongoing, being addressed via oral nutrition supplements  GOAL:   Patient will meet greater than or equal to 90% of their needs  Progressing  MONITOR:   PO intake, Supplement acceptance, Labs, Weight trends  REASON FOR ASSESSMENT:   Consult Assessment of nutrition requirement/status  ASSESSMENT:   65 year old male who presented on 12/21 from SNF after an unwitnessed fall with resulting laceration to forehead. PMH of TBI, PAD, TAA/AAA, HLD, HTN. Pt found to have acute SDH.  Spoke with pt at bedside. Pt in recliner working on his January 2023 calendar. Pt reports that his appetite continues to be good in his eyes. Pt states that he is consuming most of his meal trays. Meal completions have averaged 74% over the last 8 documented meals. Pt with an Ensure at bedside. He states that he is drinking 3 Ensure supplements daily. RD encouraged pt to continue to eat well at meals and take supplements.  Spoke with RN who confirms pt eating well at most meals and drinking 3 Ensure supplements daily. Will continue with current interventions.  Admit weight: 46.1 kg Current weight: 46.7 kg  Meal Completion: 25-100% x last 8 meals (averaging 74%)  Medications reviewed and include: colace, Ensure Enlive TID, remeron, MVI, dilantin  Labs reviewed: BUN 31, hemoglobin 9.6  UOP: 1750 ml x 24 hours  Diet Order:   Diet Order             Diet regular Room service appropriate? Yes; Fluid consistency: Thin  Diet effective now                   EDUCATION NEEDS:   Education needs  have been addressed  Skin:  Skin Assessment: Reviewed RN Assessment  Last BM:  06/21/21 medium type 4  Height:   Ht Readings from Last 1 Encounters:  06/14/21 5\' 6"  (1.676 m)    Weight:   Wt Readings from Last 1 Encounters:  06/21/21 46.7 kg    BMI:  Body mass index is 16.62 kg/m.  Estimated Nutritional Needs:   Kcal:  1600-1800  Protein:  75-85 grams  Fluid:  1.6-1.8 L    Gustavus Bryant, MS, RD, LDN Inpatient Clinical Dietitian Please see AMiON for contact information.

## 2021-06-22 DIAGNOSIS — S065XAA Traumatic subdural hemorrhage with loss of consciousness status unknown, initial encounter: Secondary | ICD-10-CM | POA: Diagnosis not present

## 2021-06-22 DIAGNOSIS — M79675 Pain in left toe(s): Secondary | ICD-10-CM | POA: Diagnosis not present

## 2021-06-22 DIAGNOSIS — I739 Peripheral vascular disease, unspecified: Secondary | ICD-10-CM | POA: Diagnosis not present

## 2021-06-22 LAB — RESP PANEL BY RT-PCR (FLU A&B, COVID) ARPGX2
Influenza A by PCR: NEGATIVE
Influenza B by PCR: NEGATIVE
SARS Coronavirus 2 by RT PCR: NEGATIVE

## 2021-06-22 LAB — GLUCOSE, CAPILLARY: Glucose-Capillary: 85 mg/dL (ref 70–99)

## 2021-06-22 MED ORDER — ATENOLOL 25 MG PO TABS
50.0000 mg | ORAL_TABLET | Freq: Every day | ORAL | Status: DC
  Administered 2021-06-22 – 2021-06-24 (×3): 50 mg via ORAL
  Filled 2021-06-22 (×3): qty 2

## 2021-06-22 NOTE — TOC Progression Note (Signed)
Transition of Care [facility]) - Progression Note    Patient Details  Name: [name] MRN: [id number] Date of Birth: [birth year]  Transition of Care [facility]) CM/SW [place], [place] Phone Number: 06/22/2021, 11:17 AM  Clinical Narrative:     CSW sent message to Dian Queen # [phone]- to confirmed availability- waiting on call back  CSW spoke with patient's sister, [name],updated- waiting on confirmation form [place].  Thurmond Butts, MSW, LCSW Clinical Social Worker     Expected Discharge Plan: Skilled Nursing Facility Barriers to Discharge: Continued Medical Work up, SNF Pending bed offer  Expected Discharge Plan and Services Expected Discharge Plan: [facility] In-house Referral: Clinical Social Work     Living arrangements for the past 2 months: [place]                                       Social Determinants of Health (SDOH) Interventions    Readmission Risk Interventions No flowsheet data found.

## 2021-06-22 NOTE — Progress Notes (Addendum)
ABI Findings:  +---------+------------------+-----+----------+--------+   Right     Rt Pressure (mmHg) Index Waveform   Comment    +---------+------------------+-----+----------+--------+   Brachial  143                      triphasic             +---------+------------------+-----+----------+--------+   PTA       116                0.74  monophasic            +---------+------------------+-----+----------+--------+   DP        129                0.82  monophasic            +---------+------------------+-----+----------+--------+   Great Toe 60                 0.38                        +---------+------------------+-----+----------+--------+   +---------+------------------+-----+----------+-------+   Left      Lt Pressure (mmHg) Index Waveform   Comment   +---------+------------------+-----+----------+-------+   Brachial  157                      triphasic            +---------+------------------+-----+----------+-------+   PTA       85                 0.54  monophasic           +---------+------------------+-----+----------+-------+   DP        117                0.75  monophasic           +---------+------------------+-----+----------+-------+   Great Toe 0                  0.00                       +---------+------------------+-----+----------+-------+   +-------+-----------+-----------+------------+------------+   ABI/TBI Today's ABI Today's TBI Previous ABI Previous TBI   +-------+-----------+-----------+------------+------------+   Right   0.82        0.38        1.04         0.33           +-------+-----------+-----------+------------+------------+   Left    0.75        0           0.91         0.5            +-------+-----------+-----------+------------+------------+    Summary:  Right: Resting right ankle-brachial index indicates mild right lower  extremity arterial disease. The right toe-brachial index is abnormal.   Left: Resting left ankle-brachial index  indicates moderate left lower  extremity arterial disease.     CT angiography reveals stenosis within the left proximal above-knee popliteal artery and in the tibial vessels.    A/P:  PAD Left second toe mild tip erythema without skin compromise  Because he cannot be anticoagulated because of his head bleed.  I would try to avoid vascular intervention, as the durability would be limited given the inability to treat him with antiplatelet therapy.  I would continue  current management.  If the left foot deteriorates, we could consider angiography.  Please contact me if there are any other changes or concerns while he is in the hospital.  Otherwise he will follow-up in the office in approximately 3 to 4 weeks.   Roxy Horseman PA-C [facility] [phone]
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
## 2021-06-23 DIAGNOSIS — I739 Peripheral vascular disease, unspecified: Secondary | ICD-10-CM | POA: Diagnosis not present

## 2021-06-23 DIAGNOSIS — M79675 Pain in left toe(s): Secondary | ICD-10-CM | POA: Diagnosis not present

## 2021-06-23 DIAGNOSIS — S065XAA Traumatic subdural hemorrhage with loss of consciousness status unknown, initial encounter: Secondary | ICD-10-CM | POA: Diagnosis not present

## 2021-06-23 DIAGNOSIS — I10 Essential (primary) hypertension: Secondary | ICD-10-CM

## 2021-06-23 LAB — GLUCOSE, CAPILLARY: Glucose-Capillary: 126 mg/dL — ABNORMAL HIGH (ref 70–99)

## 2021-06-23 NOTE — Plan of Care (Signed)

## 2021-06-23 NOTE — NC FL2 (Signed)
[place] LEVEL OF CARE SCREENING TOOL     IDENTIFICATION  Patient Name: [name] Birthdate: 29-Jun-1955 Sex: male Admission Date (Current Location): 06/14/2021  [facility] and [place] Number:  Herbalist and Address:  [facility], [place] [address], [place], [place] [address]      Provider Number: [id number]  Attending Physician Name and Address:  Oswald Hillock, MD  Relative Name and Phone Number:       Current Level of Care: Hospital Recommended Level of Care: Memory Care Prior Approval Number:    Date Approved/Denied:   PASRR Number: [id number]  Discharge Plan: Other (Comment) (Memory Care)    Current Diagnoses: Patient Active Problem List   Diagnosis Date Noted   Protein-calorie malnutrition, severe 06/15/2021   Subdural hematoma 06/14/2021   Lung nodule    AAA (abdominal aortic aneurysm) without rupture 10/21/2012   Thoracic aneurysm without mention of rupture 10/21/2012   Hyperlipidemia 10/18/2012   Poor short term memory 10/18/2012   Depression 10/16/2012   Dry skin 10/16/2012   Urge urinary incontinence 08/26/2012   Lumbosacral radiculopathy at L4 07/30/2012   Hyperextension of knee or lower leg 05/08/2012   Shoulder pain, right 02/16/2012   Healthcare maintenance 02/16/2012   Cardiomyopathy, nonischemic ([place]) 02/14/2012   Short-term memory loss 02/14/2012   Hyponatremia 10/18/2011   Seizures ([place]) 10/18/2011   Hypertension 10/18/2011   TBI (traumatic brain injury) 10/18/2011   H/O alcohol abuse 10/18/2011   Tobacco abuse 10/18/2011    Orientation RESPIRATION BLADDER Height & Weight     Self  Normal Continent, External catheter Weight: 104 lb 4.4 oz (47.3 kg) Height:  5\' 6"  (167.6 cm)  BEHAVIORAL SYMPTOMS/MOOD NEUROLOGICAL BOWEL NUTRITION STATUS      Incontinent Diet (please see discharge summary)  AMBULATORY STATUS COMMUNICATION OF NEEDS Skin   Limited Assist Verbally Skin abrasions                        Personal Care Assistance Level of Assistance  Bathing, Feeding, Dressing Bathing Assistance: Limited assistance Feeding assistance: Independent Dressing Assistance: Limited assistance     Functional Limitations Info  Sight, Hearing, Speech Sight Info: Impaired Hearing Info: Adequate Speech Info: Adequate    SPECIAL CARE FACTORS FREQUENCY  PT (By licensed PT), OT (By licensed OT)     PT Frequency: 5/xweek OT Frequency: 5x/week            Contractures Contractures Info: Not present    Additional Factors Info  Code Status, Allergies Code Status Info: FULL Allergies Info: Isovue (Iopamidol)           Current Medications (06/23/2021):  This is the current hospital active medication list Current Facility-Administered Medications  Medication Dose Route Frequency Provider Last Rate Last Admin   acetaminophen (TYLENOL) tablet 650 mg  650 mg Oral Q6H PRN Marylyn Ishihara, Tyrone A, DO   650 mg at 06/21/21 1638   Or   acetaminophen (TYLENOL) suppository 650 mg  650 mg Rectal Q6H PRN Marylyn Ishihara, Tyrone A, DO       atenolol (TENORMIN) tablet 50 mg  50 mg Oral Daily Oswald Hillock, MD   50 mg at 06/23/21 0916   busPIRone (BUSPAR) tablet 10 mg  10 mg Oral BID Wendee Beavers T, MD   10 mg at 06/23/21 0916   cephALEXin (KEFLEX) capsule 500 mg  500 mg Oral Q12H Emeterio Reeve, DO   500 mg at 06/23/21 [id number]  docusate sodium (COLACE) capsule 100 mg  100 mg Oral BID Wendee Beavers T, MD   100 mg at 06/22/21 2126   feeding supplement (ENSURE ENLIVE / ENSURE PLUS) liquid 237 mL  237 mL Oral TID BM Wendee Beavers T, MD   237 mL at 06/23/21 0917   mirtazapine (REMERON) tablet 15 mg  15 mg Oral QHS Wendee Beavers T, MD   15 mg at 06/22/21 2126   multivitamin (PROSIGHT) tablet 1 tablet  1 tablet Oral Daily Mercy Riding, MD   1 tablet at 06/23/21 9449   mupirocin ointment (BACTROBAN) 2 %   Topical QID Emeterio Reeve, DO   Given at 06/23/21 0917   ondansetron [facility]) tablet 4 mg  4 mg Oral Q6H PRN  Cherylann Ratel A, DO       Or   ondansetron (ZOFRAN) injection 4 mg  4 mg Intravenous Q6H PRN Marylyn Ishihara, Tyrone A, DO       phenytoin (DILANTIN) ER capsule 260 mg  260 mg Oral QHS Gonfa, Taye T, MD   260 mg at 06/22/21 2126   polyethylene glycol (MIRALAX / GLYCOLAX) packet 17 g  17 g Oral BID PRN Wendee Beavers T, MD   17 g at 06/18/21 1135   senna (SENOKOT) tablet 8.6 mg  1 tablet Oral BID PRN Wendee Beavers T, MD   8.6 mg at 06/21/21 0806   sodium chloride flush (NS) 0.9 % injection 3 mL  3 mL Intravenous Q12H Kyle, Tyrone A, DO   3 mL at 06/23/21 6759     Discharge Medications: Please see discharge summary for a list of discharge medications.  Relevant Imaging Results:  Relevant Lab Results:   Additional Information SSN#: [id number] Moderna COVID-19 Vaccine 09/03/2019 , 08/06/2019  Kynzli Rease, LCSW

## 2021-06-23 NOTE — TOC Progression Note (Signed)
Transition of Care [facility]) - Progression Note    Patient Details  Name: [name] MRN: [id number] Date of Birth: [birth year]  Transition of Care [facility] CM/SW [place], [place] Phone Number: 06/23/2021, 3:01 PM  Clinical Narrative:     Received call back form [facility]/Mindy(business manager)- advised Crystal will be in the office tomorrow morning - requested to email Fl2 to [email or web address] & she will forward information to [employer], confirmed she received email- CSW informed TOC/Sup Denyse Amass contact information was provided- Crystal will need to contact her with questions regarding discharge tomorrow.  Patient's sister been updated on disposition plan  CSW anticipate patient will d/c tomorrow . TOC will continue to follow and assist with discharge planning.    Thurmond Butts, MSW, LCSW Clinical Social Worker    Expected Discharge Plan: Skilled Nursing Facility Barriers to Discharge: Continued Medical Work up, SNF Pending bed offer  Expected Discharge Plan and Services Expected Discharge Plan: [place] In-house Referral: Clinical Social Work     Living arrangements for the past 2 months: [place]                                       Social Determinants of Health (SDOH) Interventions    Readmission Risk Interventions No flowsheet data found.

## 2021-06-23 NOTE — Progress Notes (Signed)
Physical Therapy Treatment Patient Details Name: [name] MRN: [id number] DOB: [birth year] Today's Date: 06/23/2021   History of Present Illness 65 y/o male presented to ED on 06/14/21 following unwitnessed fall with R brow laceration. Patient with recent history of 5 falls in past 2 weeks at ALF. CTH showed acute SDH on R with maximal thickness of 11 mm. No neurosurgical intervention required. PMH: TBI in 1988, dementia, HTN, seizures, aortic aneurysm    PT Comments    Patient requires supervision for mobility for safety due to cognitive deficits. Patient able to don socks, shoes, and braces with supervision. Patient ambulated 300' with supervision and RW. Patient with difficulty with R foot clearance but responds well with cues. Encouraged frequent mobility at discharge to ALF for increasing activity tolerance. Patient will require frequent supervision at discharge for safety.  Recommend HHPT at discharge to address balance, strength, and activity tolerance deficits to maximize functional independence and safety.    Recommendations for follow up therapy are one component of a multi-disciplinary discharge planning process, led by the attending physician.  Recommendations may be updated based on patient status, additional functional criteria and insurance authorization.  Follow Up Recommendations  Home health PT     Assistance Recommended at Discharge Frequent or constant Supervision/Assistance  Equipment Recommendations  Rolling Shooter Tangen (2 wheels)    Recommendations for Other Services       Precautions / Restrictions Precautions Precautions: Fall Precaution Comments: hx of falls, R AFO and hyperextension brace (place over pants - does not fit without them) Required Braces or Orthoses: Other Brace Other Brace: R AFO and hyperextension brace     Mobility  Bed Mobility               General bed mobility comments: in recliner on arrival    Transfers Overall transfer  level: Needs assistance Equipment used: Rolling Eldean Klatt (2 wheels) Transfers: Sit to/from Stand Sit to Stand: Supervision           General transfer comment: able to perform sit to stand from recliner x 5 during session for dressing/toileting    Ambulation/Gait Ambulation/Gait assistance: Supervision Gait Distance (Feet): 300 Feet Assistive device: Rolling [place]ie Basher (2 wheels) Gait Pattern/deviations: Step-to pattern;Decreased stride length Gait velocity: decreased     General Gait Details: supervision for safety. Cues required for R foot clearance at times.   Stairs             Wheelchair Mobility    Modified Rankin (Stroke Patients Only) Modified Rankin (Stroke Patients Only) Pre-Morbid Rankin Score: Moderate disability Modified Rankin: Moderately severe disability     Balance Overall balance assessment: Needs assistance Sitting-balance support: No upper extremity supported;Feet supported Sitting balance-Leahy Scale: Good Sitting balance - Comments: able to don socks, shoes, and braces without assist   Standing balance support: No upper extremity supported Standing balance-Leahy Scale: Fair Standing balance comment: able to statically stand to don belt with supervision                            Cognition Arousal/Alertness: Awake/alert Behavior During Therapy: WFL for tasks assessed/performed Overall Cognitive Status: History of cognitive impairments - at baseline                                          Exercises      General Comments  Pertinent Vitals/Pain Pain Assessment: No/denies pain    Home Living                          Prior Function            PT Goals (current goals can now be found in the care plan section) Acute Rehab PT Goals Patient Stated Goal: to stop falling PT Goal Formulation: With family Time For Goal Achievement: 06/29/21 Potential to Achieve Goals: Fair Progress towards  PT goals: Progressing toward goals    Frequency    Min 3X/week      PT Plan Current plan remains appropriate    Co-evaluation              AM-PAC PT "6 Clicks" Mobility   Outcome Measure  Help needed turning from your back to your side while in a flat bed without using bedrails?: A Little Help needed moving from lying on your back to sitting on the side of a flat bed without using bedrails?: A Little Help needed moving to and from a bed to a chair (including a wheelchair)?: A Little Help needed standing up from a chair using your arms (e.g., wheelchair or bedside chair)?: A Little Help needed to walk in hospital room?: A Little Help needed climbing 3-5 steps with a railing? : Total 6 Click Score: 16    End of Session Equipment Utilized During Treatment: Other (comment) (R AFO and knee hyperextension brace) Activity Tolerance: Patient tolerated treatment well Patient left: in chair;with call bell/phone within reach;with chair alarm set Nurse Communication: Mobility status PT Visit Diagnosis: Muscle weakness (generalized) (M62.81);History of falling (Z91.81);Other abnormalities of gait and mobility (R26.89);Unsteadiness on feet (R26.81)     Time: 7322-0254 PT Time Calculation (min) (ACUTE ONLY): 43 min  Charges:  $Gait Training: 23-37 mins $Therapeutic Activity: 8-22 mins                     Lenice Koper A. Gilford Rile PT, DPT Acute Rehabilitation Services Pager [phone] Office [phone]    Linna Hoff 06/23/2021, 4:08 PM
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
## 2021-06-23 NOTE — TOC Progression Note (Addendum)
Transition of Care [facility]) - Progression Note    Patient Details  Name: [name] MRN: [id number] Date of Birth: 1956/04/27  Transition of Care [facility]) CM/SW [place], [place] Phone Number: 06/23/2021, 11:20 AM  Clinical Narrative:     Darleen Crocker- called administrative Nicole [facility]- unable to leave voice message because mail box was full.  CSW spoke with patient's sister,Michelle. CSW advise have not received confirmation from [facility]. CSW discuss with her the possibly of patient returning to ALF if supervision is provided as recommended by therapy. She was agrees with the patient returning to ALF. She reports patient was receiving [place] PT before he was admitted to the hospital. If return to ALF will need [place] orders.   CSW called [facility] # [phone] with Leslye Peer, she states her Director of Nursing ([place]) is out and she will ne be back until Tuesday. CSW has informed TOC Sup/[place]se placement barrier-   CSW will continue to follow for final disposition plan and provide update to team once d/c plan is in place  Thurmond Butts, MSW, LCSW Clinical Social Worker    Expected Discharge Plan: [place] Barriers to Discharge: Continued Medical Work up, SNF Pending bed offer  Expected Discharge Plan and Services Expected Discharge Plan: [place] In-house Referral: Clinical Social Work     Living arrangements for the past 2 months: [place]                                       Social Determinants of Health (SDOH) Interventions    Readmission Risk Interventions No flowsheet data found.

## 2021-06-24 LAB — GLUCOSE, CAPILLARY: Glucose-Capillary: 104 mg/dL — ABNORMAL HIGH (ref 70–99)

## 2021-06-24 LAB — CBC
HCT: 35.7 % — ABNORMAL LOW (ref 39.0–52.0)
Hemoglobin: 11.6 g/dL — ABNORMAL LOW (ref 13.0–17.0)
MCH: 30.3 pg (ref 26.0–34.0)
MCHC: 32.5 g/dL (ref 30.0–36.0)
MCV: 93.2 fL (ref 80.0–100.0)
Platelets: 237 10*3/uL (ref 150–400)
RBC: 3.83 MIL/uL — ABNORMAL LOW (ref 4.22–5.81)
RDW: 13.7 % (ref 11.5–15.5)
WBC: 7.7 10*3/uL (ref 4.0–10.5)
nRBC: 0 % (ref 0.0–0.2)

## 2021-06-24 LAB — COMPREHENSIVE METABOLIC PANEL
ALT: 52 U/L — ABNORMAL HIGH (ref 0–44)
AST: 33 U/L (ref 15–41)
Albumin: 3.7 g/dL (ref 3.5–5.0)
Alkaline Phosphatase: 88 U/L (ref 38–126)
Anion gap: 9 (ref 5–15)
BUN: 32 mg/dL — ABNORMAL HIGH (ref 8–23)
CO2: 31 mmol/L (ref 22–32)
Calcium: 9.4 mg/dL (ref 8.9–10.3)
Chloride: 97 mmol/L — ABNORMAL LOW (ref 98–111)
Creatinine, Ser: 0.85 mg/dL (ref 0.61–1.24)
GFR, Estimated: >60 mL/min (ref >60)
Glucose, Bld: 91 mg/dL (ref 70–99)
Potassium: 4.6 mmol/L (ref 3.5–5.1)
Sodium: 137 mmol/L (ref 135–145)
Total Bilirubin: 0.7 mg/dL (ref 0.3–1.2)
Total Protein: 6.8 g/dL (ref 6.5–8.1)

## 2021-06-24 MED ORDER — ATENOLOL 50 MG PO TABS: 50.0000 mg | ORAL_TABLET | Freq: Every day | ORAL | 2 refills | Status: DC

## 2021-06-24 MED ORDER — ATENOLOL 50 MG PO TABS: 50.0000 mg | ORAL_TABLET | Freq: Every day | ORAL | 2 refills | Status: AC

## 2021-06-24 NOTE — TOC Progression Note (Addendum)
Transition of Care [facility]) - Progression Note    Patient Details  Name: [name] MRN: [id number] Date of Birth: September 23, 1955  Transition of Care [facility]) CM/SW [place], [place] Phone Number: 06/24/2021, 9:27 AM  Clinical Narrative:    CSW informed that patient was planned to be Dc'd today to [place] (most recent note stated [facility] but had no record of patient, [facility] also documented). CSW attempted 5x to reach front desk through main phone line but phone call kept being disconnected or hung up on (unclear). CSW reached out to liaison and left HIPPA compliant voicemail. Executive director's phone went straight to voicemail. CSW will attempt to reach facility through the day and update note.   11:00a: CSW received call from [place] regarding leadership involvement. CSW provided update but noted the call from DON. CSW will update TOC supervisor at 12p   CSW received call from blocked number from [place], [place]. Per Crystal the FL-2 needed to be corrected but was unclear and request CSW send records on patient over to facility as she needed more information. CSW sent over requested information. Per Crystal her office phone was not working and to call the main desk, CSW informed her they were not answering and was told she would call CSW back.   Expected Discharge Plan: [place] Barriers to Discharge: Continued Medical Work up, SNF Pending bed offer  Expected Discharge Plan and Services Expected Discharge Plan: [place] In-house Referral: Clinical Social Work     Living arrangements for the past 2 months: [place] Expected Discharge Date: 06/24/21                                     Social Determinants of Health (SDOH) Interventions    Readmission Risk Interventions No flowsheet data found.

## 2021-06-24 NOTE — Progress Notes (Signed)
Patient discharged with sister via wheelchair to private vehicle to go to [facility]  No changes in assessment.  All belongings accounted for and walker sent with patient.

## 2021-06-24 NOTE — Progress Notes (Signed)
Sutures removed from above right eye as ordered by Dr. Darrick Meigs.  Patient tolerated well.  Wound closed with minimal scabbing, no drainage.

## 2021-06-24 NOTE — NC FL2 (Addendum)
[place] LEVEL OF CARE SCREENING TOOL     IDENTIFICATION  Patient Name: [name] Birthdate: Apr 23, 1956 Sex: male Admission Date (Current Location): 06/14/2021  [facility] and [place] Number:  Herbalist and Address:  The . [facility], [place] [address] [place], [place] [address]      Provider Number: [id number]  Attending Physician Name and Address:  Oswald Hillock, MD  Relative Name and Phone Number:       Current Level of Care: Hospital Recommended Level of Care: Memory Care Prior Approval Number:    Date Approved/Denied:   PASRR Number: [id number]  Discharge Plan: Other (Comment) (Memory Care)    Current Diagnoses: Patient Active Problem List   Diagnosis Date Noted   Protein-calorie malnutrition, severe 06/15/2021   Subdural hematoma 06/14/2021   Lung nodule    AAA (abdominal aortic aneurysm) without rupture 10/21/2012   Thoracic aneurysm without mention of rupture 10/21/2012   Hyperlipidemia 10/18/2012   Poor short term memory 10/18/2012   Depression 10/16/2012   Dry skin 10/16/2012   Urge urinary incontinence 08/26/2012   Lumbosacral radiculopathy at L4 07/30/2012   Hyperextension of knee or lower leg 05/08/2012   Shoulder pain, right 02/16/2012   Healthcare maintenance 02/16/2012   Cardiomyopathy, nonischemic ([place]) 02/14/2012   Short-term memory loss 02/14/2012   Hyponatremia 10/18/2011   Seizures ([place]) 10/18/2011   Hypertension 10/18/2011   TBI (traumatic brain injury) 10/18/2011   H/O alcohol abuse 10/18/2011   Tobacco abuse 10/18/2011    Orientation RESPIRATION BLADDER Height & Weight     Self  Normal Continent, External catheter Weight: 101 lb 13.6 oz (46.2 kg) Height:  5\' 6"  (167.6 cm)  BEHAVIORAL SYMPTOMS/MOOD NEUROLOGICAL BOWEL NUTRITION STATUS      Incontinent Diet (please see discharge summary)  AMBULATORY STATUS COMMUNICATION OF NEEDS Skin   Limited Assist Verbally Skin abrasions                        Personal Care Assistance Level of Assistance  Bathing, Feeding, Dressing Bathing Assistance: Limited assistance Feeding assistance: Independent Dressing Assistance: Limited assistance     Functional Limitations Info  Sight, Hearing, Speech Sight Info: Impaired Hearing Info: Adequate Speech Info: Adequate    SPECIAL CARE FACTORS FREQUENCY  PT (By licensed PT), OT (By licensed OT) [facility]        Contractures Contractures Info: Not present    Additional Factors Info  Code Status, Allergies Code Status Info: FULL Allergies Info: Isovue (Iopamidol)           Current Medications (06/24/2021):  This is the current hospital active medication list Current Facility-Administered Medications  Medication Dose Route Frequency Provider Last Rate Last Admin   acetaminophen (TYLENOL) tablet 650 mg  650 mg Oral Q6H PRN Marylyn Ishihara, Tyrone A, DO   650 mg at 06/24/21 8921   Or   acetaminophen (TYLENOL) suppository 650 mg  650 mg Rectal Q6H PRN Marylyn Ishihara, Tyrone A, DO       atenolol (TENORMIN) tablet 50 mg  50 mg Oral Daily Oswald Hillock, MD   50 mg at 06/24/21 0825   busPIRone (BUSPAR) tablet 10 mg  10 mg Oral BID Wendee Beavers T, MD   10 mg at 06/24/21 0825   cephALEXin (KEFLEX) capsule 500 mg  500 mg Oral Q12H Emeterio Reeve, DO   500 mg at 06/24/21 0825   docusate sodium (COLACE) capsule 100 mg  100  mg Oral BID Wendee Beavers T, MD   100 mg at 06/23/21 2123   feeding supplement (ENSURE ENLIVE / ENSURE PLUS) liquid 237 mL  237 mL Oral TID BM Gonfa, Taye T, MD   237 mL at 06/24/21 0825   mirtazapine (REMERON) tablet 15 mg  15 mg Oral QHS Wendee Beavers T, MD   15 mg at 06/23/21 2123   multivitamin (PROSIGHT) tablet 1 tablet  1 tablet Oral Daily Mercy Riding, MD   1 tablet at 06/24/21 0825   mupirocin ointment (BACTROBAN) 2 %   Topical QID Emeterio Reeve, DO   Given at 06/24/21 0824   ondansetron (ZOFRAN) tablet 4 mg  4 mg Oral Q6H PRN Cherylann Ratel A, DO        Or   ondansetron (ZOFRAN) injection 4 mg  4 mg Intravenous Q6H PRN Marylyn Ishihara, Tyrone A, DO       phenytoin (DILANTIN) ER capsule 260 mg  260 mg Oral QHS Wendee Beavers T, MD   260 mg at 06/23/21 2123   polyethylene glycol (MIRALAX / GLYCOLAX) packet 17 g  17 g Oral BID PRN Wendee Beavers T, MD   17 g at 06/18/21 1135   senna (SENOKOT) tablet 8.6 mg  1 tablet Oral BID PRN Wendee Beavers T, MD   8.6 mg at 06/21/21 0806   sodium chloride flush (NS) 0.9 % injection 3 mL  3 mL Intravenous Q12H Kyle, Tyrone A, DO   3 mL at 06/24/21 7048     Discharge Medications: Please see discharge summary for a list of discharge medications.  Relevant Imaging Results:  Relevant Lab Results:   Additional Information SSN#: [id number] Moderna COVID-19 Vaccine 09/03/2019 , 08/06/2019  Alfredia Ferguson, LCSW

## 2021-06-24 NOTE — Plan of Care (Signed)

## 2021-06-24 NOTE — NC FL2 (Signed)
[place] LEVEL OF CARE SCREENING TOOL     IDENTIFICATION  Patient Name: JERAME HEDDING Birthdate: 09-Mar-1956 Sex: male Admission Date (Current Location): 06/14/2021  [facility] and [place] Number:  Herbalist and Address:  The [place], [place] [address] [place], [place] [address]      Provider Number: [id number]  Attending Physician Name and Address:  Oswald Hillock, MD  Relative Name and Phone Number:       Current Level of Care: Hospital Recommended Level of Care: Memory Care Prior Approval Number:    Date Approved/Denied:   PASRR Number: [id number]  Discharge Plan: Other (Comment) (Memory Care)    Current Diagnoses: Patient Active Problem List   Diagnosis Date Noted   Protein-calorie malnutrition, severe 06/15/2021   Subdural hematoma 06/14/2021   Dementia without behavioral disturbance ([place]) 04/25/2021   Lung nodule    AAA (abdominal aortic aneurysm) without rupture 10/21/2012   Thoracic aneurysm without mention of rupture 10/21/2012   Hyperlipidemia 10/18/2012   Poor short term memory 10/18/2012   Depression 10/16/2012   Dry skin 10/16/2012   Urge urinary incontinence 08/26/2012   Lumbosacral radiculopathy at L4 07/30/2012   Hyperextension of knee or lower leg 05/08/2012   Shoulder pain, right 02/16/2012   Healthcare maintenance 02/16/2012   Cardiomyopathy, nonischemic ([facility]) 02/14/2012   Short-term memory loss 02/14/2012   Hyponatremia 10/18/2011   Seizures ([place]) 10/18/2011   Hypertension 10/18/2011   TBI (traumatic brain injury) 10/18/2011   H/O alcohol abuse 10/18/2011   Tobacco abuse 10/18/2011    Orientation RESPIRATION BLADDER Height & Weight     Self  Normal Continent, External catheter Weight: 101 lb 13.6 oz (46.2 kg) Height:  5\' 6"  (167.6 cm)  BEHAVIORAL SYMPTOMS/MOOD NEUROLOGICAL BOWEL NUTRITION STATUS      Incontinent Diet (no added table salt)  AMBULATORY STATUS COMMUNICATION OF NEEDS Skin    Limited Assist Verbally Skin abrasions                       Personal Care Assistance Level of Assistance  Bathing, Feeding, Dressing Bathing Assistance: Limited assistance Feeding assistance: Independent Dressing Assistance: Limited assistance     Functional Limitations Info  Sight, Hearing, Speech Sight Info: Impaired Hearing Info: Adequate Speech Info: Adequate    SPECIAL CARE FACTORS FREQUENCY  PT (By licensed PT), OT (By licensed OT)     PT Frequency: 5/xweek OT Frequency: 5x/week            Contractures Contractures Info: Not present    Additional Factors Info  Code Status, Allergies Code Status Info: FULL Allergies Info: Isovue (Iopamidol)           Current Medications (06/24/2021):  This is the current hospital active medication list Current Facility-Administered Medications  Medication Dose Route Frequency Provider Last Rate Last Admin   acetaminophen (TYLENOL) tablet 650 mg  650 mg Oral Q6H PRN Marylyn Ishihara, Tyrone A, DO   650 mg at 06/24/21 2202   Or   acetaminophen (TYLENOL) suppository 650 mg  650 mg Rectal Q6H PRN Marylyn Ishihara, Tyrone A, DO       atenolol (TENORMIN) tablet 50 mg  50 mg Oral Daily Oswald Hillock, MD   50 mg at 06/24/21 0825   busPIRone (BUSPAR) tablet 10 mg  10 mg Oral BID Wendee Beavers T, MD   10 mg at 06/24/21 0825   cephALEXin (KEFLEX) capsule 500 mg  500 mg Oral Q12H Emeterio Reeve,  DO   500 mg at 06/24/21 0825   docusate sodium (COLACE) capsule 100 mg  100 mg Oral BID Wendee Beavers T, MD   100 mg at 06/23/21 2123   feeding supplement (ENSURE ENLIVE / ENSURE PLUS) liquid 237 mL  237 mL Oral TID BM Gonfa, Taye T, MD   237 mL at 06/24/21 0825   mirtazapine (REMERON) tablet 15 mg  15 mg Oral QHS Wendee Beavers T, MD   15 mg at 06/23/21 2123   multivitamin (PROSIGHT) tablet 1 tablet  1 tablet Oral Daily Mercy Riding, MD   1 tablet at 06/24/21 0825   mupirocin ointment (BACTROBAN) 2 %   Topical QID Emeterio Reeve, DO   Given at 06/24/21 0824    ondansetron (ZOFRAN) tablet 4 mg  4 mg Oral Q6H PRN Cherylann Ratel A, DO       Or   ondansetron (ZOFRAN) injection 4 mg  4 mg Intravenous Q6H PRN Marylyn Ishihara, Tyrone A, DO       phenytoin (DILANTIN) ER capsule 260 mg  260 mg Oral QHS Cyndia Skeeters, Taye T, MD   260 mg at 06/23/21 2123   polyethylene glycol (MIRALAX / GLYCOLAX) packet 17 g  17 g Oral BID PRN Wendee Beavers T, MD   17 g at 06/18/21 1135   senna (SENOKOT) tablet 8.6 mg  1 tablet Oral BID PRN Wendee Beavers T, MD   8.6 mg at 06/21/21 0806   sodium chloride flush (NS) 0.9 % injection 3 mL  3 mL Intravenous Q12H Kyle, Tyrone A, DO   3 mL at 06/24/21 3419     Discharge Medications: Please see discharge summary for a list of discharge medications.  Relevant Imaging Results:  Relevant Lab Results:   Additional Information SSN#: [id number] Moderna COVID-19 Vaccine 09/03/2019 , 08/06/2019  Ma Munoz, Francetta Found, LCSW

## 2021-06-24 NOTE — Discharge Summary (Addendum)
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 Findings: +---------+------------------+-----+----------+--------+  Right     Rt Pressure (mmHg) Index Waveform   Comment   +---------+------------------+-----+----------+--------+  Brachial  143                      triphasic            +---------+------------------+-----+----------+--------+  PTA       116                0.74  monophasic           +---------+------------------+-----+----------+--------+  DP        129                0.82  monophasic           +---------+------------------+-----+----------+--------+  Great Toe 60                 0.38                       +---------+------------------+-----+----------+--------+ +---------+------------------+-----+----------+-------+  Left      Lt Pressure (mmHg) Index Waveform   Comment  +---------+------------------+-----+----------+-------+  Brachial  157                      triphasic           +---------+------------------+-----+----------+-------+  PTA  85                 0.54  monophasic           +---------+------------------+-----+----------+-------+  DP        117                0.75  monophasic          +---------+------------------+-----+----------+-------+  Great Toe 0                  0.00                      +---------+------------------+-----+----------+-------+ +-------+-----------+-----------+------------+------------+  ABI/TBI Today's ABI Today's TBI Previous ABI Previous TBI  +-------+-----------+-----------+------------+------------+  Right   0.82        0.38        1.04         0.33          +-------+-----------+-----------+------------+------------+  Left    0.75        0           0.91         0.5           +-------+-----------+-----------+------------+------------+  Summary: Right: Resting right ankle-brachial index indicates mild right lower extremity arterial disease. The right toe-brachial index is abnormal. Left: Resting left ankle-brachial index indicates moderate left lower extremity arterial disease. Unable to obtain TBI due to low amplitude waveforms.  *See table(s) above for measurements and observations.  Electronically signed by Jamelle Haring on 06/21/2021 at 3:16:58 PM.    Final    ECHOCARDIOGRAM COMPLETE  Result Date: 06/15/2021    ECHOCARDIOGRAM REPORT   Patient Name:   VENSON FERENCZ Date of Exam: 06/15/2021 Medical Rec #:  [id number]         Height:       66.0 in Accession #:    [id number]        Weight:       101.6 lb Date of Birth:  May 21, 1956         BSA:          1.500 m Patient Age:    84 years          BP:           136/68 mmHg Patient Gender: M                 HR:           73 bpm. Exam Location:  Inpatient Procedure: 2D Echo, Cardiac Doppler and Color Doppler Indications:    Syncope. Five falls in two weeks  History:        Patient has no prior history of Echocardiogram examinations.  Sonographer:    Merrie Roof RDCS Referring Phys: [id number] [place]  1. Left ventricular ejection fraction, by estimation, is 55 to 60%. The left ventricle has  normal function. The left ventricle has no regional wall motion abnormalities. There is mild left ventricular hypertrophy. Left ventricular diastolic parameters were normal.  2. Right ventricular systolic function is normal. The right ventricular size is normal.  3. Right atrial size was mildly dilated.  4. The mitral valve is grossly normal. No evidence of mitral valve regurgitation. No evidence of mitral stenosis.  5. The aortic valve is tricuspid. Aortic valve regurgitation is not visualized. No aortic stenosis is present.  6. The inferior vena cava is normal in size with greater than 50% respiratory  variability, suggesting right atrial pressure of 3 mmHg. Comparison(s): No prior Echocardiogram. Conclusion(s)/Recommendation(s): Normal biventricular function without evidence of hemodynamically significant valvular heart disease. FINDINGS  Left Ventricle: Left ventricular ejection fraction, by estimation, is 55 to 60%. The left ventricle has normal function. The left ventricle has no regional wall motion abnormalities. The left ventricular internal cavity size was normal in size. There is  mild left ventricular hypertrophy. Left ventricular diastolic parameters were normal. Right Ventricle: The right ventricular size is normal. Right vetricular wall thickness was not well visualized. Right ventricular systolic function is normal. Left Atrium: Left atrial size was normal in size. Right Atrium: Right atrial size was mildly dilated. Pericardium: There is no evidence of pericardial effusion. Mitral Valve: The mitral valve is grossly normal. No evidence of mitral valve regurgitation. No evidence of mitral valve stenosis. Tricuspid Valve: The tricuspid valve is grossly normal. Tricuspid valve regurgitation is not demonstrated. No evidence of tricuspid stenosis. Aortic Valve: The aortic valve is tricuspid. Aortic valve regurgitation is not visualized. No aortic stenosis is present. Aortic valve mean gradient measures 2.0  mmHg. Aortic valve peak gradient measures 3.7 mmHg. Aortic valve area, by VTI measures 2.50 cm. Pulmonic Valve: The pulmonic valve was not well visualized. Pulmonic valve regurgitation is not visualized. No evidence of pulmonic stenosis. Aorta: The aortic root, ascending aorta and aortic arch are all structurally normal, with no evidence of dilitation or obstruction. Venous: The inferior vena cava is normal in size with greater than 50% respiratory variability, suggesting right atrial pressure of 3 mmHg. IAS/Shunts: The atrial septum is grossly normal.  LEFT VENTRICLE PLAX 2D LVIDd:         3.30 cm   Diastology LVIDs:         2.30 cm   LV e' medial:    9.46 cm/s LV PW:         0.90 cm   LV E/e' medial:  5.5 LV IVS:        1.10 cm   LV e' lateral:   10.30 cm/s LVOT diam:     1.80 cm   LV E/e' lateral: 5.1 LV SV:         46 LV SV Index:   30 LVOT Area:     2.54 cm  RIGHT VENTRICLE RV Basal diam:  2.70 cm LEFT ATRIUM           Index        RIGHT ATRIUM           Index LA diam:      1.90 cm 1.27 cm/m   RA Area:     14.60 cm LA Vol (A2C): 20.7 ml 13.80 ml/m  RA Volume:   42.00 ml  28.00 ml/m LA Vol (A4C): 20.8 ml 13.87 ml/m  AORTIC VALVE AV Area (Vmax):    2.39 cm AV Area (Vmean):   2.41 cm AV Area (VTI):     2.50 cm AV Vmax:           96.40 cm/s AV Vmean:          63.300 cm/s AV VTI:            0.182 m AV Peak Grad:      3.7 mmHg AV Mean Grad:      2.0 mmHg LVOT Vmax:         90.60 cm/s LVOT Vmean:        59.900 cm/s LVOT VTI:  0.179 m LVOT/AV VTI ratio: 0.98  AORTA Ao Root diam: 3.10 cm MITRAL VALVE MV Area (PHT): 3.53 cm    SHUNTS MV Decel Time: 215 msec    Systemic VTI:  0.18 m MV E velocity: 52.30 cm/s  Systemic Diam: 1.80 cm MV A velocity: 74.70 cm/s MV E/A ratio:  0.70 Buford Dresser MD Electronically signed by Buford Dresser MD Signature Date/Time: 06/15/2021/2:35:04 PM    Final    US ABDOMEN LIMITED RUQ (LIVER/GB)  Result Date: 06/17/2021 CLINICAL DATA:  Elevated LFTs EXAM:  ULTRASOUND ABDOMEN LIMITED RIGHT UPPER QUADRANT COMPARISON:  None. FINDINGS: Gallbladder: No gallstones or wall thickening visualized. No sonographic Murphy sign noted by sonographer. Common bile duct: Diameter: 3 mm Liver: No focal lesion identified. Increased parenchymal echogenicity. Portal vein is patent on color Doppler imaging with normal direction of blood flow towards the liver. 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 IMPRESSION: 1. Hepatic steatosis. 2. 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 Electronically Signed   By: Delanna Ahmadi M.D.   On: 06/17/2021 16:33    Microbiology: Recent Results (from the past 240 hour(s))  MRSA Next Gen by PCR, Nasal     Status: None   Collection Time: 06/14/21  4:39 PM   Specimen: Nasal Mucosa; Nasal Swab  Result Value Ref Range Status   MRSA by PCR Next Gen NOT DETECTED NOT DETECTED Final    Comment: (NOTE) The GeneXpert MRSA Assay (FDA approved for NASAL specimens only), is one component of a comprehensive MRSA colonization surveillance program. It is not intended to diagnose MRSA infection nor to guide or monitor treatment for MRSA infections. Test performance is not FDA approved in patients less than 59 years old. Performed at [facility], [place] [address], [place], [place] [address]   Resp Panel by RT-PCR (Flu A&B, Covid) Nasopharyngeal Swab     Status: None   Collection Time: 06/22/21 12:24 PM   Specimen: Nasopharyngeal Swab; Nasopharyngeal(NP) swabs in vial transport medium  Result Value Ref Range Status   SARS Coronavirus 2 by RT PCR NEGATIVE NEGATIVE Final    Comment: (NOTE) SARS-CoV-2 target nucleic acids are NOT DETECTED.  The SARS-CoV-2 RNA is generally detectable in upper  respiratory specimens during the acute phase of infection. The lowest concentration of SARS-CoV-2 viral copies this assay can detect is 138 copies/mL. A negative result does not preclude SARS-Cov-2 infection and should not be used as the sole basis for treatment or other patient management decisions. A negative result may occur with  improper specimen collection/handling, submission of specimen other than nasopharyngeal swab, presence of viral mutation(s) within the areas targeted by this assay, and inadequate number of viral copies(<138 copies/mL). A negative result must be combined with clinical observations, patient history, and epidemiological information. The expected result is Negative.  Fact Sheet for Patients:  EntrepreneurPulse.com.au  Fact Sheet for Healthcare Providers:  IncredibleEmployment.be  This test is no t yet approved or cleared by the [place] FDA and  has been authorized for detection and/or diagnosis of SARS-CoV-2 by FDA under an Emergency Use Authorization (EUA). This EUA will remain  in effect (meaning this test can be used) for the duration of the COVID-19 declaration under Section 564(b)(1) of the Act, 21 U.S.C.section 360bbb-3(b)(1), unless the authorization  is terminated  or revoked sooner.       Influenza A by PCR NEGATIVE NEGATIVE Final   Influenza B by PCR NEGATIVE NEGATIVE Final    Comment: (NOTE) The Xpert Xpress SARS-CoV-2/FLU/RSV plus assay is intended as an aid in the diagnosis of influenza from Nasopharyngeal swab specimens and should not be used as a sole basis for treatment. Nasal washings and aspirates are unacceptable for Xpert Xpress SARS-CoV-2/FLU/RSV testing.  Fact Sheet for Patients: EntrepreneurPulse.com.au  Fact Sheet for Healthcare Providers: IncredibleEmployment.be  This test is not yet approved or cleared by the [place] FDA and has been  authorized for detection and/or diagnosis of SARS-CoV-2 by FDA under an Emergency Use Authorization (EUA). This EUA will remain in effect (meaning this test can be used) for the duration of the COVID-19 declaration under Section 564(b)(1) of the Act, 21 U.S.C. section 360bbb-3(b)(1), unless the authorization is terminated or revoked.  Performed at [facility], [place] [address], [place], [place] [address]      Labs: Basic Metabolic Panel: Recent Labs  Lab 06/18/21 0615 06/19/21 0431 06/20/21 0233 06/21/21 0315 06/24/21 0315  NA 134* 134* 134* 138 137  K 4.2 3.9 4.2 3.6 4.6  CL 97* 94* 96* 96* 97*  CO2 31 31 28 30 31   GLUCOSE 94 97 153* 90 91  BUN 33* 33* 34* 31* 32*  CREATININE 0.88 0.90 0.85 0.77 0.85  CALCIUM 9.1 9.3 9.2 9.1 9.4  MG 2.0  --   --   --   --   PHOS 4.0  --   --   --   --    Liver Function Tests: Recent Labs  Lab 06/18/21 0615 06/19/21 0431 06/20/21 0233 06/21/21 0315 06/24/21 0315  AST 84* 59* 45* 37 33  ALT 162* 126* 96* 76* 52*  ALKPHOS 84 92 90 84 88  BILITOT 0.8 0.9 0.8 0.6 0.7  PROT 6.7 6.8 6.8 6.8 6.8  ALBUMIN 3.8 3.8 3.7 3.7 3.7   No results for input(s): LIPASE, AMYLASE in the last 168 hours. No results for input(s): AMMONIA in the last 168 hours. CBC: Recent Labs  Lab 06/18/21 0615 06/20/21 0233 06/21/21 0315 06/24/21 0315  WBC  --  7.7 9.0 7.7  HGB 10.4* 10.6* 9.6* 11.6*  HCT 31.1* 31.7* 29.4* 35.7*  MCV  --  90.3 90.7 93.2  PLT  --  230 251 237   Cardiac Enzymes: Recent Labs  Lab 06/19/21 0431  CKTOTAL 181   BNP: BNP (last 3 results) No results for input(s): BNP in the last 8760 hours.  ProBNP (last 3 results) No results for input(s): PROBNP in the last 8760 hours.  CBG: Recent Labs  Lab 06/18/21 0757 06/19/21 0740 06/22/21 0503 06/23/21 0505 06/24/21 0443  GLUCAP 91 84 85 126* 104*       Signed:  Oswald Hillock MD.  [facility] 06/24/2021, 1:37 PM

## 2021-06-24 NOTE — Progress Notes (Addendum)
Received referral for [facility] PT/OT. Per previous CM note, pt was receiving HHPT prior to this admission. Contacted pt's sister [name]) to discuss the name of the [facility] agency. She reports that she doesn't know the name of the [facility] agency. She reports that he only received one visit because he was admitted. She reports [place] arranged the [facility] and she will try to contact them to discuss the [facility]. Asked [place], SW to discuss [place] with [employer].   Per [facility], pt was active with [facility] prior to this admission. She will contact [facility] to continue [place]. She reports we need to add in the [facility] to resume the [facility].

## 2021-06-24 NOTE — Progress Notes (Addendum)
Received referral to assist with RW. Per RN, the RW was delivered yesterday, but pt declined it because he has one; however, pt has a youth walker. [place] with [facility] and she will send another RW.  Jasmine reports that the RW was not returned. She reports that the RW was delivered and pt kept it. I went in pt's room and the RW is in the room. RN notified.

## 2021-06-24 NOTE — TOC Transition Note (Signed)
Transition of Care [facility]) - CM/SW Discharge Note   Patient Details  Name: [name] MRN: [id number] Date of Birth: 1956-01-18  Transition of Care [facility]) CM/SW Contact:  Alfredia Ferguson, LCSW Phone Number: 06/24/2021, 1:01 PM   Clinical Narrative:    Patient is discharging to [facility] on 06/24/21. Patient's sister will be providing transportation and noted family verbalized understanding of discharge plan. Updated medical records have been sent to facility and reviewed by Crystal, DON of the facility. Family and patient declined any additional questions, DC order and summary are placed and in the chart. RN aware. Please reach out to CSW for additional TOC needs as identified.    Final next level of care: Memory Care Barriers to Discharge: Barriers Resolved   Patient Goals and CMS Choice     Choice offered to / list presented to : Patient, Sibling  Discharge Placement              Patient chooses bed at: [facility] Patient to be transferred to facility by: Sister Name of family member notified: [name], [phone] Patient and family notified of of transfer: 06/24/21  Discharge Plan and Services In-house Referral: Clinical Social Work                        HH Arranged: PT, OT [place] Agency: [facility]        Social Determinants of Health (SDOH) Interventions     Readmission Risk Interventions No flowsheet data found.

## 2021-07-06 ENCOUNTER — Ambulatory Visit
Admission: RE | Admit: 2021-07-06 | Discharge: 2021-07-06 | Disposition: A | Discharge status: Home / Self Care | Payer: Medicaid Other | Source: Ambulatory Visit | Attending: Pulmonary Disease | Admitting: Pulmonary Disease

## 2021-07-06 DIAGNOSIS — R911 Solitary pulmonary nodule: Secondary | ICD-10-CM

## 2021-07-12 ENCOUNTER — Other Ambulatory Visit: Payer: Self-pay | Admitting: Neurosurgery

## 2021-07-12 DIAGNOSIS — S065XAA Traumatic subdural hemorrhage with loss of consciousness status unknown, initial encounter: Secondary | ICD-10-CM

## 2021-07-17 ENCOUNTER — Other Ambulatory Visit: Payer: Self-pay

## 2021-07-17 ENCOUNTER — Ambulatory Visit (INDEPENDENT_AMBULATORY_CARE_PROVIDER_SITE_OTHER): Payer: Medicaid Other | Admitting: Physician Assistant

## 2021-07-17 VITALS — BP 120/70 | HR 68 | Temp 97.5°F | Resp 18 | Ht 66.0 in | Wt 107.0 lb

## 2021-07-17 DIAGNOSIS — I739 Peripheral vascular disease, unspecified: Secondary | ICD-10-CM
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
## 2021-08-02 ENCOUNTER — Encounter: Payer: Self-pay | Admitting: Neurology

## 2021-08-02 ENCOUNTER — Ambulatory Visit: Payer: Medicaid Other | Admitting: Neurology

## 2021-08-02 ENCOUNTER — Other Ambulatory Visit: Payer: Self-pay

## 2021-08-02 VITALS — BP 145/71 | HR 62 | Ht 66.0 in | Wt 101.0 lb

## 2021-08-02 DIAGNOSIS — S065XAA Traumatic subdural hemorrhage with loss of consciousness status unknown, initial encounter: Secondary | ICD-10-CM | POA: Diagnosis not present

## 2021-08-02 DIAGNOSIS — G40909 Epilepsy, unspecified, not intractable, without status epilepticus: Secondary | ICD-10-CM

## 2021-08-02 DIAGNOSIS — S069X9S Unspecified intracranial injury with loss of consciousness of unspecified duration, sequela: Secondary | ICD-10-CM

## 2021-08-02 DIAGNOSIS — R413 Other amnesia: Secondary | ICD-10-CM | POA: Diagnosis not present

## 2021-08-02 DIAGNOSIS — Z5181 Encounter for therapeutic drug level monitoring: Secondary | ICD-10-CM
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
## 2021-08-02 NOTE — Patient Instructions (Signed)
Check Dilantin level today  Will also check CMP, Vitamin D and B 12 level  Follow up with neurosurgery as scheduled  Continue current medications  Return in 1 year

## 2021-08-03 ENCOUNTER — Telehealth: Payer: Self-pay | Admitting: *Deleted

## 2021-08-03 LAB — COMPREHENSIVE METABOLIC PANEL
ALT: 21 IU/L (ref 0–44)
AST: 28 IU/L (ref 0–40)
Albumin/Globulin Ratio: 1.8 (ref 1.2–2.2)
Albumin: 5.1 g/dL — ABNORMAL HIGH (ref 3.8–4.8)
Alkaline Phosphatase: 96 IU/L (ref 44–121)
BUN/Creatinine Ratio: 19 (ref 10–24)
BUN: 20 mg/dL (ref 8–27)
Bilirubin Total: 0.2 mg/dL (ref 0.0–1.2)
CO2: 27 mmol/L (ref 20–29)
Calcium: 10.3 mg/dL — ABNORMAL HIGH (ref 8.6–10.2)
Chloride: 101 mmol/L (ref 96–106)
Creatinine, Ser: 1.06 mg/dL (ref 0.76–1.27)
Globulin, Total: 2.8 g/dL (ref 1.5–4.5)
Glucose: 82 mg/dL (ref 70–99)
Potassium: 4.8 mmol/L (ref 3.5–5.2)
Sodium: 141 mmol/L (ref 134–144)
Total Protein: 7.9 g/dL (ref 6.0–8.5)
eGFR: 78 mL/min/{1.73_m2} (ref >59)

## 2021-08-03 LAB — PHENYTOIN LEVEL, TOTAL: Phenytoin (Dilantin), Serum: 11.7 ug/mL (ref 10.0–20.0)

## 2021-08-03 LAB — VITAMIN D 25 HYDROXY (VIT D DEFICIENCY, FRACTURES): Vit D, 25-Hydroxy: 44.6 ng/mL (ref 30.0–100.0)

## 2021-08-03 LAB — VITAMIN B12: Vitamin B-12: 416 pg/mL (ref 232–1245)

## 2021-08-03 NOTE — Telephone Encounter (Signed)
Thanks

## 2021-08-03 NOTE — Telephone Encounter (Signed)
-----   Message from Alric Ran, MD sent at 08/03/2021  9:44 AM EST ----- Please call and advise the patient sister that the recent labs we checked were within normal limits. We checked vitamin D level, vitamin B12 level, cell count, blood sugar, diabetes marker, electrolytes, kidney function, liver function, and dilantin level. No further action is required on these tests at this time. Please remind patient to keep any upcoming appointments or tests and to call us with any interim questions, concerns, problems or updates. Thanks,   Alric Ran, MD

## 2021-08-03 NOTE — Telephone Encounter (Addendum)
The patient resides at [facility] in [place].  I spoke to his sister on DPR and provided her with the lab results.  She inquired about starting a medication for his memory loss.  Per vo by Dr. April Manson, he may try memantine 10mg , 0.5 tab BID x 2 weeks, then increase to 1 tab BID thereafter.  After further discussion, she would like to hold off on the medication for now. She would like to research the drug further. She will call back, if they decide the to start it. Says she may even ask the PCP at the facility to prescribe.

## 2021-08-03 NOTE — Progress Notes (Signed)
Please call and advise the patient sister that the recent labs we checked were within normal limits. We checked vitamin D level, vitamin B12 level, cell count, blood sugar, diabetes marker, electrolytes, kidney function, liver function, and dilantin level. No further action is required on these tests at this time. Please remind patient to keep any upcoming appointments or tests and to call us with any interim questions, concerns, problems or updates. Thanks,   Alric Ran, MD

## 2021-08-04 ENCOUNTER — Ambulatory Visit
Admission: RE | Admit: 2021-08-04 | Discharge: 2021-08-04 | Disposition: A | Discharge status: Home / Self Care | Payer: Medicaid Other | Source: Ambulatory Visit | Attending: Neurosurgery | Admitting: Neurosurgery

## 2021-08-04 DIAGNOSIS — S065XAA Traumatic subdural hemorrhage with loss of consciousness status unknown, initial encounter: Secondary | ICD-10-CM

## 2021-08-11 ENCOUNTER — Encounter: Payer: Self-pay | Admitting: Pulmonary Disease

## 2021-08-11 ENCOUNTER — Ambulatory Visit (INDEPENDENT_AMBULATORY_CARE_PROVIDER_SITE_OTHER): Payer: Medicaid Other | Admitting: Pulmonary Disease

## 2021-08-11 ENCOUNTER — Other Ambulatory Visit: Payer: Self-pay

## 2021-08-11 VITALS — BP 136/60 | HR 66 | Temp 98.2°F | Ht 68.0 in | Wt 107.8 lb

## 2021-08-11 DIAGNOSIS — F17200 Nicotine dependence, unspecified, uncomplicated: Secondary | ICD-10-CM | POA: Diagnosis not present

## 2021-08-11 DIAGNOSIS — R911 Solitary pulmonary nodule: Secondary | ICD-10-CM | POA: Diagnosis not present

## 2021-08-11 DIAGNOSIS — F79 Unspecified intellectual disabilities: Secondary | ICD-10-CM
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
## 2021-08-11 NOTE — Patient Instructions (Signed)
Thank you for visiting Dr. Valeta Harms at [facility]. Today we recommend the following:  Orders Placed This Encounter  Procedures   CT CHEST WO CONTRAST   Repeat CT chest in 1 year  Return in about 1 year (around 08/11/2022) for with Eric Form, NP, or Dr. Valeta Harms.    Please do your part to reduce the spread of COVID-19.

## 2021-08-17 ENCOUNTER — Other Ambulatory Visit: Payer: Self-pay | Admitting: Physician Assistant

## 2021-08-17 DIAGNOSIS — I739 Peripheral vascular disease, unspecified: Secondary | ICD-10-CM

## 2021-08-17 MED ORDER — CLOPIDOGREL BISULFATE 75 MG PO TABS: 75.0000 mg | ORAL_TABLET | Freq: Every day | ORAL | 11 refills | Status: AC

## 2021-08-17 MED ORDER — ASPIRIN EC 81 MG PO TBEC: 81.0000 mg | DELAYED_RELEASE_TABLET | Freq: Every day | ORAL | 11 refills | Status: AC

## 2021-08-18 ENCOUNTER — Telehealth: Payer: Self-pay

## 2021-08-18 NOTE — Telephone Encounter (Signed)
Pt's sister called asking which anticoagulants pt is to restart. Per MD, he should take both ASA and Plavix. Sister is aware and the requested hand written rx is at front desk waiting for her to pick up. Per sister, the pt's facility needs it on paper to fill rx for pt. No further questions/concerns at this time.

## 2021-08-21 ENCOUNTER — Encounter: Payer: Self-pay | Admitting: Physician Assistant

## 2021-09-08 ENCOUNTER — Other Ambulatory Visit: Payer: Self-pay

## 2021-09-08 ENCOUNTER — Ambulatory Visit (INDEPENDENT_AMBULATORY_CARE_PROVIDER_SITE_OTHER): Payer: Medicaid Other | Admitting: Podiatry

## 2021-09-08 ENCOUNTER — Encounter: Payer: Self-pay | Admitting: Podiatry

## 2021-09-08 DIAGNOSIS — B351 Tinea unguium: Secondary | ICD-10-CM

## 2021-09-08 DIAGNOSIS — M79676 Pain in unspecified toe(s): Secondary | ICD-10-CM

## 2021-09-08 DIAGNOSIS — L84 Corns and callosities: Secondary | ICD-10-CM

## 2021-09-08 DIAGNOSIS — I739 Peripheral vascular disease, unspecified: Secondary | ICD-10-CM

## 2021-09-17 NOTE — Progress Notes (Signed)
?  Subjective:  ?Patient ID: [name], male    DOB: [birth year],  MRN: [id number] ? ?[name] presents to clinic today for for at risk foot care. Patient has h/o PAD and callus(es) L hallux and painful thick toenails that are difficult to trim. Painful toenails interfere with ambulation. Aggravating factors include wearing enclosed shoe gear. Pain is relieved with periodic professional debridement. Painful calluses are aggravated when weightbearing with and without shoegear. Pain is relieved with periodic professional debridement. ? ?Patient is followed by [facility]; last visit was 07/17/2021 ? ?New problem(s): None.  ? ?PCP is Kurth-Bowen, Cornelia, PA-C , and patient is unsure of last visit.Marland Kitchen ? ?Allergies  ?Allergen Reactions  ? Isovue [Iopamidol] Hives  ?  Pt broke out in one hive on his chest after contrast on 01/03/15.  Pt will need full premeds in the future per Dr Martinique.    ? ? ?Review of Systems: Negative except as noted in the HPI. ? ?Objective: No changes noted in today's physical examination. ? ?Vascular Examination: ?CFT delayed b/l LE. Diminished DP/PT pulses b/l LE. Pedal hair absent. No pain with calf compression b/l. Lower extremity skin temperature gradient warm to cool. No edema noted b/l LE. No ischemia or gangrene noted b/l LE. No cyanosis or clubbing noted b/l LE.  ? ?Dermatological Examination: ?Pedal skin thin, shiny and atrophic b/l LE. No open wounds b/l LE. No interdigital macerations noted b/l LE. Toenails 1-5 b/l elongated, discolored, dystrophic, thickened, crumbly with subungual debris and tenderness to dorsal palpation. Hyperkeratotic lesion(s) L hallux.  No erythema, no edema, no drainage, no fluctuance.  ? ?Neurological Examination: ?Protective sensation intact 5/5 intact bilaterally with 10g monofilament b/l. Vibratory sensation intact b/l. Proprioception intact bilaterally.  ? ?Musculoskeletal Examination: ?Normal muscle strength 5/5 to all lower extremity  muscle groups bilaterally. No pain, crepitus or joint limitation noted with ROM b/l LE. No gross bony pedal deformities b/l. Patient ambulates independently without assistive aids.  ? ?Assessment/Plan: ?1. Pain due to onychomycosis of toenail   ?2. Callus   ?3. PAD (peripheral artery disease) ([place])   ?  ?-Patient was evaluated and treated. All patient's and/or POA's questions/concerns answered on today's visit. ?-Mycotic toenails 1-5 bilaterally were debrided in length and girth with sterile nail nippers and dremel without incident. ?-Callus(es) L hallux pared utilizing sterile scalpel blade without complication or incident. Total number debrided =1. ?-Patient/POA to call should there be question/concern in the interim.  ? ?Return in about 3 months (around 12/09/2021). ? ?Marzetta Board, DPM  ?

## 2021-10-31 ENCOUNTER — Other Ambulatory Visit: Payer: Self-pay

## 2021-10-31 DIAGNOSIS — I739 Peripheral vascular disease, unspecified: Secondary | ICD-10-CM

## 2021-11-06 ENCOUNTER — Emergency Department (HOSPITAL_COMMUNITY): Payer: Medicaid Other

## 2021-11-06 ENCOUNTER — Emergency Department (HOSPITAL_COMMUNITY)
Admission: EM | Admit: 2021-11-06 | Discharge: 2021-11-07 | Disposition: A | Discharge status: Skilled Nursing Facility | Payer: Medicaid Other | Attending: Emergency Medicine | Admitting: Emergency Medicine

## 2021-11-06 ENCOUNTER — Encounter (HOSPITAL_COMMUNITY): Payer: Self-pay | Admitting: Student

## 2021-11-06 DIAGNOSIS — S40011A Contusion of right shoulder, initial encounter: Secondary | ICD-10-CM | POA: Insufficient documentation

## 2021-11-06 DIAGNOSIS — R911 Solitary pulmonary nodule: Secondary | ICD-10-CM | POA: Diagnosis not present

## 2021-11-06 DIAGNOSIS — Z79899 Other long term (current) drug therapy: Secondary | ICD-10-CM | POA: Diagnosis not present

## 2021-11-06 DIAGNOSIS — S0993XA Unspecified injury of face, initial encounter: Secondary | ICD-10-CM | POA: Diagnosis present

## 2021-11-06 DIAGNOSIS — F0390 Unspecified dementia without behavioral disturbance: Secondary | ICD-10-CM | POA: Diagnosis not present

## 2021-11-06 DIAGNOSIS — Z7902 Long term (current) use of antithrombotics/antiplatelets: Secondary | ICD-10-CM | POA: Diagnosis not present

## 2021-11-06 DIAGNOSIS — E875 Hyperkalemia: Secondary | ICD-10-CM | POA: Insufficient documentation

## 2021-11-06 DIAGNOSIS — Z7982 Long term (current) use of aspirin: Secondary | ICD-10-CM | POA: Diagnosis not present

## 2021-11-06 DIAGNOSIS — S01411A Laceration without foreign body of right cheek and temporomandibular area, initial encounter: Secondary | ICD-10-CM | POA: Insufficient documentation

## 2021-11-06 DIAGNOSIS — W19XXXA Unspecified fall, initial encounter: Secondary | ICD-10-CM | POA: Diagnosis not present

## 2021-11-06 DIAGNOSIS — D649 Anemia, unspecified: Secondary | ICD-10-CM | POA: Diagnosis not present

## 2021-11-06 DIAGNOSIS — I10 Essential (primary) hypertension: Secondary | ICD-10-CM | POA: Diagnosis not present

## 2021-11-06 LAB — BASIC METABOLIC PANEL
Anion gap: 8 (ref 5–15)
BUN: 25 mg/dL — ABNORMAL HIGH (ref 8–23)
CO2: 26 mmol/L (ref 22–32)
Calcium: 9.6 mg/dL (ref 8.9–10.3)
Chloride: 103 mmol/L (ref 98–111)
Creatinine, Ser: 1.14 mg/dL (ref 0.61–1.24)
GFR, Estimated: >60 mL/min (ref >60)
Glucose, Bld: 105 mg/dL — ABNORMAL HIGH (ref 70–99)
Potassium: 5.3 mmol/L — ABNORMAL HIGH (ref 3.5–5.1)
Sodium: 137 mmol/L (ref 135–145)

## 2021-11-06 LAB — CBC WITH DIFFERENTIAL/PLATELET
Abs Immature Granulocytes: 0.03 10*3/uL (ref 0.00–0.07)
Basophils Absolute: 0 10*3/uL (ref 0.0–0.1)
Basophils Relative: 0 %
Eosinophils Absolute: 0.4 10*3/uL (ref 0.0–0.5)
Eosinophils Relative: 4 %
HCT: 36.9 % — ABNORMAL LOW (ref 39.0–52.0)
Hemoglobin: 12 g/dL — ABNORMAL LOW (ref 13.0–17.0)
Immature Granulocytes: 0 %
Lymphocytes Relative: 14 %
Lymphs Abs: 1.5 10*3/uL (ref 0.7–4.0)
MCH: 29.6 pg (ref 26.0–34.0)
MCHC: 32.5 g/dL (ref 30.0–36.0)
MCV: 91.1 fL (ref 80.0–100.0)
Monocytes Absolute: 0.8 10*3/uL (ref 0.1–1.0)
Monocytes Relative: 8 %
Neutro Abs: 7.8 10*3/uL — ABNORMAL HIGH (ref 1.7–7.7)
Neutrophils Relative %: 74 %
Platelets: 164 10*3/uL (ref 150–400)
RBC: 4.05 MIL/uL — ABNORMAL LOW (ref 4.22–5.81)
RDW: 13.2 % (ref 11.5–15.5)
WBC: 10.5 10*3/uL (ref 4.0–10.5)
nRBC: 0 % (ref 0.0–0.2)

## 2021-11-06 NOTE — ED Triage Notes (Signed)
Pt to ED via EMS. Pt from [facility]. Pt had unwitnessed fall tonight. Pt found on ground. Pt has lac to right eye. Pt on ASA. Pt AAOx4, hx of dementia. Pt c/o right wrist pain as well. Pt states he does not remember what happened tonight.  ? ? ?EMS vitals: ?130/70 BP ?78 HR ?99% RA ?113 CBG ? ?

## 2021-11-06 NOTE — ED Provider Notes (Signed)
?[facility] ?Provider Note ? ? ?CSN: [id number] ?Arrival date & time: 11/06/21  2323 ? ?  ? ?History ? ?Chief Complaint  ?Patient presents with  ? Fall  ? ? ?[name] is a 66 y.o. male with a history of hypertension, hyperlipidemia, aortic aneurysm, TBI, cardiomyopathy, etoh abuse, prior subdural hematoma, and dementia who presents to the ED for evaluation S/p fall. Per EMS patient had an unwitnessed fall @ care facility during which he hit his head therefore they called EMS. Patient reports he is not sure what happened, he was trying to get in bed. Reports pain to his right hand and to his head. No alleviating/aggravating factors. He denies chest pain, dyspnea, back pain, or abdominal pain. Per his sister who arrived shortly after the patient she reports confusion @ baseline, has had a few falls recently, ambulatory with a walker however sometimes tries to walk without it leading to falls, does drag the right foot some @ baseline.  ? ?HPI ? ?  ? ?Home Medications ?Prior to Admission medications   ?Medication Sig Start Date End Date Taking? Authorizing Provider  ?aspirin EC 81 MG tablet Take 1 tablet (81 mg total) by mouth daily. 08/17/21   Ulyses Amor, PA-C  ?clopidogrel (PLAVIX) 75 MG tablet Take 1 tablet (75 mg total) by mouth daily. 08/17/21   Ulyses Amor, PA-C  ?acetaminophen (TYLENOL) 500 MG tablet Take 1,000 mg by mouth daily. '1000mg'$  oral daily ?And '500mg'$  additional oral every six hours as needed for fever, headache, pain    [provider]  ?aluminum-magnesium hydroxide-simethicone (MAALOX) 200-200-20 MG/5ML SUSP Take 30 mLs by mouth every 6 (six) hours as needed (heartburn/indigestion).    [provider]  ?atenolol (TENORMIN) 50 MG tablet Take 1 tablet (50 mg total) by mouth daily. 06/24/21   Oswald Hillock, MD  ?busPIRone (BUSPAR) 10 MG tablet Take 10 mg by mouth 3 (three) times daily.    [provider]  ?Ergocalciferol  (VITAMIN D2) 10 MCG (400 UNIT) TABS Take 800 Units by mouth daily.    [provider]  ?famotidine (PEPCID) 20 MG tablet Take 1 tablet (20 mg total) by mouth daily for 1 day. Take one tablet by mouth the night before procedure ?Patient not taking: Reported on 06/14/2021 02/20/21 02/28/21  Serafina Mitchell, MD  ?guaiFENesin (ROBITUSSIN) 100 MG/5ML liquid Take 10 mLs by mouth every 6 (six) hours as needed for cough.    [provider]  ?loperamide (IMODIUM) 2 MG capsule Take 2 mg by mouth as needed for diarrhea or loose stools. Do not exceed '16mg'$  in 24 hours    [provider]  ?magnesium hydroxide (MILK OF MAGNESIA) 400 MG/5ML suspension Take 30 mLs by mouth at bedtime as needed for mild constipation.    [provider]  ?mineral oil-hydrophilic petrolatum (AQUAPHOR) ointment Apply 1 application topically daily. Apply topically to arms, legs and feet daily for dry skin    [provider]  ?mirtazapine (REMERON) 15 MG tablet Take 15 mg by mouth at bedtime.    [provider]  ?Multiple Vitamins-Minerals (CERTAVITE/ANTIOXIDANTS) TABS Take 1 tablet by mouth daily.    [provider]  ?neomycin-bacitracin-polymyxin (NEOSPORIN) ointment Apply 1 application topically as needed for wound care.    [provider]  ?Nutritional Supplement LIQD Take 1 Bottle by mouth 3 (three) times daily. Mighty Shake    [provider]  ?phenytoin (DILANTIN) 100 MG ER capsule Take 200 mg  by mouth at bedtime.  06/23/12   Bartholomew Crews, MD  ?phenytoin (DILANTIN) 30 MG ER capsule Take 60 mg by mouth at bedtime.     [provider]  ?pravastatin (PRAVACHOL) 40 MG tablet Take 40 mg by mouth at bedtime.    [provider]  ?VITAMIN D PO Take by mouth.    [provider]  ?   ? ?Allergies    ?Isovue [iopamidol]   ? ?Review of Systems   ?Review of Systems  ?Constitutional:  Negative for chills and fever.  ?Respiratory:  Negative for  shortness of breath.   ?Cardiovascular:  Negative for chest pain.  ?Gastrointestinal:  Negative for abdominal pain and vomiting.  ?Musculoskeletal:  Positive for arthralgias.  ?Neurological:  Positive for headaches.  ?Psychiatric/Behavioral:  Positive for confusion (at baseline).   ? ?Physical Exam ?Updated Vital Signs ?BP (!) 146/59   Pulse 66   Temp 97.8 ?F (36.6 ?C) (Oral)   Resp 14   SpO2 99%  ?Physical Exam ?Vitals and nursing note reviewed.  ?Constitutional:   ?   General: He is not in acute distress. ?   Appearance: He is not toxic-appearing.  ?HENT:  ?   Head: No Battle's sign.  ?   Comments: Right periorbital ecchymosis and TTP. Abrasion to the inferior lateral right periorbital region. No proptosis.  ?   Right Ear: No hemotympanum.  ?   Left Ear: No hemotympanum.  ?   Nose: Nose normal.  ?   Mouth/Throat:  ?   Pharynx: Uvula midline.  ?Eyes:  ?   Extraocular Movements: Extraocular movements intact.  ?   Conjunctiva/sclera:  ?   Right eye: Hemorrhage (medially) present.  ?   Left eye: No hemorrhage. ? ?   Comments: PERRL.  ?Fluorescein stain without uptake, no findings of abrasion, negative Seidel sign.  ?Neck:  ?   Comments: No point/focal vertebral tenderness or step off.  ?Cardiovascular:  ?   Rate and Rhythm: Normal rate and regular rhythm.  ?   Pulses:     ?     Radial pulses are 2+ on the right side and 2+ on the left side.  ?     Dorsalis pedis pulses are 2+ on the right side and 2+ on the left side.  ?Pulmonary:  ?   Effort: Pulmonary effort is normal.  ?   Breath sounds: Normal breath sounds.  ?Chest:  ?   Chest wall: No tenderness.  ?Abdominal:  ?   General: There is no distension.  ?   Palpations: Abdomen is soft.  ?   Tenderness: There is no abdominal tenderness. There is no guarding or rebound.  ?Musculoskeletal:  ?   Cervical back: Neck supple.  ?   Comments: Upper extremities: old appearing bruise to right shoulder. Intact AROM throughout all major joints. TTP to right 1st digit,  otherwise nontender, no anatomical snuffbox tenderness.  ?No midline spinal tenderness ?Lower extremities: Able to lift bilateral legs off of the stretcher, flex/extend the knees, and plantar/dorsiflex. TTP to the right anterior lower leg. Otherwise nontender. Compartments are soft.   ?Skin: ?   General: Skin is warm and dry.  ?Neurological:  ?   Mental Status: He is alert.  ?   Comments: Alert.  Clear speech.  Moving all extremities.  Sensation grossly intact x 4.   ?Psychiatric:     ?   Mood and Affect: Mood normal.  ? ? ?ED Results / Procedures / Treatments   ?  Labs ?(all labs ordered are listed, but only abnormal results are displayed) ?Labs Reviewed  ?CBC WITH DIFFERENTIAL/PLATELET - Abnormal; Notable for the following components:  ?    Result Value  ? RBC 4.05 (*)   ? Hemoglobin 12.0 (*)   ? HCT 36.9 (*)   ? Neutro Abs 7.8 (*)   ? All other components within normal limits  ?BASIC METABOLIC PANEL  ?URINALYSIS, ROUTINE W REFLEX MICROSCOPIC  ? ? ?EKG ?EKG Interpretation ? ?Date/Time:  Monday Nov 06 2021 23:31:13 EDT ?Ventricular Rate:  73 ?PR Interval:  148 ?QRS Duration: 99 ?QT Interval:  393 ?QTC Calculation: 433 ?R Axis:   17 ?Text Interpretation: Sinus rhythm Abnormal R-wave progression, early transition Probable left ventricular hypertrophy Borderline ST elevation, lateral leads Artifact in lead(s) I III aVR aVL aVF V1 V2 V3 Confirmed by Addison Lank [phone]) on 11/07/2021 4:56:50 AM ? ?Radiology ?DG Tibia/Fibula Right ? ?Result Date: 11/07/2021 ?CLINICAL DATA:  Fall EXAM: RIGHT TIBIA AND FIBULA - 2 VIEW COMPARISON:  None Available. FINDINGS: Evidence of remote healed fracture with deformity in the proximal tibia and fibula. No acute fracture. Sclerosis in the distal femur compatible with old infarct. Right knee joint space maintained. No subluxation or dislocation. Soft tissues grossly unremarkable. IMPRESSION: Evidence of remote posttraumatic changes in the tibia and fibula as above. No acute bony  abnormality. Electronically Signed   By: Rolm Baptise M.D.   On: 11/07/2021 00:13  ? ?CT Head Wo Contrast ? ?Result Date: 11/07/2021 ?CLINICAL DATA:  Fall EXAM: CT HEAD WITHOUT CONTRAST CT MAXILLOFACIAL WITHOUT CONTRAST CT CERVICAL SPI

## 2021-11-06 NOTE — ED Notes (Signed)
Pt placed in c-collar

## 2021-11-07 ENCOUNTER — Emergency Department (HOSPITAL_COMMUNITY): Payer: Medicaid Other

## 2021-11-07 LAB — URINALYSIS, ROUTINE W REFLEX MICROSCOPIC
Bilirubin Urine: NEGATIVE
Glucose, UA: NEGATIVE mg/dL
Hgb urine dipstick: NEGATIVE
Ketones, ur: NEGATIVE mg/dL
Leukocytes,Ua: NEGATIVE
Nitrite: NEGATIVE
Protein, ur: NEGATIVE mg/dL
Specific Gravity, Urine: 1.01 (ref 1.005–1.030)
pH: 6 (ref 5.0–8.0)

## 2021-11-07 MED ORDER — FLUORESCEIN SODIUM 1 MG OP STRP
1.0000 | ORAL_STRIP | Freq: Once | OPHTHALMIC | Status: AC
  Administered 2021-11-07: 1 via OPHTHALMIC
  Filled 2021-11-07: qty 1

## 2021-11-07 MED ORDER — SODIUM CHLORIDE 0.9 % IV BOLUS
1000.0000 mL | Freq: Once | INTRAVENOUS | Status: AC
Start: 2021-11-07 — End: 2021-11-07
  Administered 2021-11-07: 1000 mL via INTRAVENOUS

## 2021-11-07 NOTE — ED Notes (Signed)
Ambulated pt with walker.

## 2021-11-07 NOTE — Discharge Instructions (Addendum)
You were seen in the ER today for a fall. Your CT imaging did not show findings of acute injury. Several other findings on CT were noted such as prior strokes, prior right lower leg injury, your aortic stent/atherosclerosis (plaque), and your pulmonary nodule-please discuss these findings with your primary care provider.  Your potassium was mildly elevated as was your BUN, this may have been dehydration related, and we suspect the potassium elevation may have been from hemolysis.  You were given fluids for this.  Please have these rechecked by primary care.  Your facial laceration was glued, the glue will eventually come off on its own.  Please try not to pick at it. ? ?Your detailed test results are below. ? ?Please follow-up with primary care within 3 days.  Use your walker at all times.  Return to the ER for any new or worsening symptoms or any other concerns. ? ?\ ?Results for orders placed or performed during the hospital encounter of 11/06/21  ?CBC with Differential  ?Result Value Ref Range  ? WBC 10.5 4.0 - 10.5 K/uL  ? RBC 4.05 (L) 4.22 - 5.81 MIL/uL  ? Hemoglobin 12.0 (L) 13.0 - 17.0 g/dL  ? HCT 36.9 (L) 39.0 - 52.0 %  ? MCV 91.1 80.0 - 100.0 fL  ? MCH 29.6 26.0 - 34.0 pg  ? MCHC 32.5 30.0 - 36.0 g/dL  ? RDW 13.2 11.5 - 15.5 %  ? Platelets 164 150 - 400 K/uL  ? nRBC 0.0 0.0 - 0.2 %  ? Neutrophils Relative % 74 %  ? Neutro Abs 7.8 (H) 1.7 - 7.7 K/uL  ? Lymphocytes Relative 14 %  ? Lymphs Abs 1.5 0.7 - 4.0 K/uL  ? Monocytes Relative 8 %  ? Monocytes Absolute 0.8 0.1 - 1.0 K/uL  ? Eosinophils Relative 4 %  ? Eosinophils Absolute 0.4 0.0 - 0.5 K/uL  ? Basophils Relative 0 %  ? Basophils Absolute 0.0 0.0 - 0.1 K/uL  ? Immature Granulocytes 0 %  ? Abs Immature Granulocytes 0.03 0.00 - 0.07 K/uL  ?Basic metabolic panel  ?Result Value Ref Range  ? Sodium 137 135 - 145 mmol/L  ? Potassium 5.3 (H) 3.5 - 5.1 mmol/L  ? Chloride 103 98 - 111 mmol/L  ? CO2 26 22 - 32 mmol/L  ? Glucose, Bld 105 (H) 70 - 99 mg/dL  ? BUN 25  (H) 8 - 23 mg/dL  ? Creatinine, Ser 1.14 0.61 - 1.24 mg/dL  ? Calcium 9.6 8.9 - 10.3 mg/dL  ? GFR, Estimated >60 >60 mL/min  ? Anion gap 8 5 - 15  ?Urinalysis, Routine w reflex microscopic  ?Result Value Ref Range  ? Color, Urine YELLOW YELLOW  ? APPearance CLEAR CLEAR  ? Specific Gravity, Urine 1.010 1.005 - 1.030  ? pH 6.0 5.0 - 8.0  ? Glucose, UA NEGATIVE NEGATIVE mg/dL  ? Hgb urine dipstick NEGATIVE NEGATIVE  ? Bilirubin Urine NEGATIVE NEGATIVE  ? Ketones, ur NEGATIVE NEGATIVE mg/dL  ? Protein, ur NEGATIVE NEGATIVE mg/dL  ? Nitrite NEGATIVE NEGATIVE  ? Leukocytes,Ua NEGATIVE NEGATIVE  ? ?DG Tibia/Fibula Right ? ?Result Date: 11/07/2021 ?CLINICAL DATA:  Fall EXAM: RIGHT TIBIA AND FIBULA - 2 VIEW COMPARISON:  None Available. FINDINGS: Evidence of remote healed fracture with deformity in the proximal tibia and fibula. No acute fracture. Sclerosis in the distal femur compatible with old infarct. Right knee joint space maintained. No subluxation or dislocation. Soft tissues grossly unremarkable. IMPRESSION: Evidence of remote posttraumatic changes  in the tibia and fibula as above. No acute bony abnormality. Electronically Signed   By: Rolm Baptise M.D.   On: 11/07/2021 00:13  ? ?CT Head Wo Contrast ? ?Result Date: 11/07/2021 ?CLINICAL DATA:  Fall EXAM: CT HEAD WITHOUT CONTRAST CT MAXILLOFACIAL WITHOUT CONTRAST CT CERVICAL SPINE WITHOUT CONTRAST TECHNIQUE: Multidetector CT imaging of the head, cervical spine, and maxillofacial structures were performed using the standard protocol without intravenous contrast. Multiplanar CT image reconstructions of the cervical spine and maxillofacial structures were also generated. RADIATION DOSE REDUCTION: This exam was performed according to the departmental dose-optimization program which includes automated exposure control, adjustment of the mA and/or kV according to patient size and/or use of iterative reconstruction technique. COMPARISON:  08/04/2021 head CT; 06/14/2021 CT  cervical spine FINDINGS: CT HEAD FINDINGS Brain: There is no mass, hemorrhage or extra-axial collection. There is generalized atrophy without lobar predilection. There is hypoattenuation of the periventricular white matter, most commonly indicating chronic ischemic microangiopathy. 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 Vascular: No abnormal hyperdensity of the major intracranial arteries or dural venous sinuses. No intracranial atherosclerosis. Skull: The visualized skull base, calvarium and extracranial soft tissues are normal. CT MAXILLOFACIAL FINDINGS Osseous: --Complex facial fracture types: No LeFort, zygomaticomaxillary complex or nasoorbitoethmoidal fracture. --Simple fracture types: None. --Mandible: No fracture or dislocation. Orbits: The globes are intact. Normal appearance of the intra- and extraconal fat. Symmetric extraocular muscles and optic nerves. Sinuses: No fluid levels or advanced mucosal thickening. Soft tissues: Normal visualized extracranial soft tissues. CT CERVICAL SPINE FINDINGS Alignment: No static subluxation. Facets are aligned. Occipital condyles and the lateral masses of C1-C2 are aligned. Discontinuous anterior osteophytes are unchanged since 06/14/2021. Skull base and vertebrae: No acute fracture. Soft tissues and spinal canal: No prevertebral fluid or swelling. No visible canal hematoma. Disc levels: Mild spinal canal stenosis at C3-4 and moderate at C4-5 and C5-6. Upper chest: No pneumothorax, pulmonary nodule or pleural effusion. Other: Normal visualized paraspinal cervical soft tissues. IMPRESSION: 1. No acute intracranial abnormality. 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 2. No facial fracture. 3. No acute fracture or static subluxation of the cervical spine. Electronically Signed   By: Ulyses Jarred M.D.   On: 11/07/2021 02:29  ? ?CT CHEST WO CONTRAST ? ?Result Date: 11/07/2021 ?CLINICAL DATA:  Fall, pulmonary nodule EXAM: CT CHEST WITHOUT CONTRAST TECHNIQUE:  Multidetector CT imaging of the chest was performed following the standard protocol without IV contrast. RADIATION DOSE REDUCTION: This exam was performed according to the departmental dose-optimization program which includes automated exposure control, adjustment of the mA and/or kV according to patient size and/or use of iterative reconstruction technique. COMPARISON:  07/06/2018 FINDINGS: Cardiovascular: Stable descending thoracic aortic stent. Atherosclerotic calcifications. Heart is normal in size.  No pericardial effusion. Coronary atherosclerosis of the LAD. Mediastinum/Nodes: No suspicious mediastinal lymphadenopathy. Visualized thyroid is unremarkable. Lungs/Pleura: Mild centrilobular and paraseptal emphysematous changes, upper lung predominant. Irregular, flattened, less solid appearance of the posterior right upper lobe nodule (series 3/images 57-58), favoring post infectious/inflammatory scarring. This measures approximately 6 x 15 mm on the current study, overall grossly unchanged. No new/suspicious pulmonary nodules. Mild right basilar atelectasis.  No focal consolidation. No pleural effusion or pneumothorax. Upper Abdomen: Visualized upper abdomen is grossly unremarkable. Musculoskeletal: Old left posterior rib fracture deformities. Visualized thoracolumbar spine is within normal limits. IMPRESSION: No evidence of traumatic injury to the chest. Stable irregular/flattened posterior right upper lobe nodule, favoring post infectious/inflammatory scarring. Stable thoracic aortic stent. Aortic Atherosclerosis (ICD10-I70.0) and Emphysema (ICD10-J43.9). Electronically  Signed   By: Julian Hy M.D.   On: 11/07/2021 02:37  ? ?CT Cervical Spine Wo Contrast ? ?Result Date: 11/07/2021 ?CLINICAL DATA:  Fall EXAM: CT HEAD WITHOUT CONTRAST CT MAXILLOFACIAL WITHOUT CONTRAST CT CERVICAL SPINE WITHOUT CONTRAST TECHNIQUE: Multidetector CT imaging of the head, cervical spine, and maxillofacial structures were  performed using the standard protocol without intravenous contrast. Multiplanar CT image reconstructions of the cervical spine and maxillofacial structures were also generated. RADIATION DOSE REDUCTION: This ex

## 2021-11-09 ENCOUNTER — Emergency Department (HOSPITAL_COMMUNITY): Payer: Medicaid Other

## 2021-11-09 ENCOUNTER — Emergency Department (HOSPITAL_COMMUNITY)
Admission: EM | Admit: 2021-11-09 | Discharge: 2021-11-09 | Disposition: A | Discharge status: Home / Self Care | Payer: Medicaid Other | Attending: Emergency Medicine | Admitting: Emergency Medicine

## 2021-11-09 DIAGNOSIS — Z7982 Long term (current) use of aspirin: Secondary | ICD-10-CM | POA: Insufficient documentation

## 2021-11-09 DIAGNOSIS — F0390 Unspecified dementia without behavioral disturbance: Secondary | ICD-10-CM | POA: Diagnosis not present

## 2021-11-09 DIAGNOSIS — S0990XA Unspecified injury of head, initial encounter: Secondary | ICD-10-CM | POA: Diagnosis present

## 2021-11-09 DIAGNOSIS — I10 Essential (primary) hypertension: Secondary | ICD-10-CM | POA: Diagnosis not present

## 2021-11-09 DIAGNOSIS — Z79899 Other long term (current) drug therapy: Secondary | ICD-10-CM | POA: Diagnosis not present

## 2021-11-09 DIAGNOSIS — S0182XA Laceration with foreign body of other part of head, initial encounter: Secondary | ICD-10-CM | POA: Insufficient documentation

## 2021-11-09 DIAGNOSIS — Z20822 Contact with and (suspected) exposure to covid-19: Secondary | ICD-10-CM | POA: Insufficient documentation

## 2021-11-09 DIAGNOSIS — W19XXXA Unspecified fall, initial encounter: Secondary | ICD-10-CM | POA: Diagnosis not present

## 2021-11-09 DIAGNOSIS — S7001XA Contusion of right hip, initial encounter: Secondary | ICD-10-CM | POA: Insufficient documentation

## 2021-11-09 DIAGNOSIS — S0181XA Laceration without foreign body of other part of head, initial encounter: Secondary | ICD-10-CM

## 2021-11-09 LAB — I-STAT CHEM 8, ED
BUN: 28 mg/dL — ABNORMAL HIGH (ref 8–23)
Calcium, Ion: 1.07 mmol/L — ABNORMAL LOW (ref 1.15–1.40)
Chloride: 100 mmol/L (ref 98–111)
Creatinine, Ser: 1.2 mg/dL (ref 0.61–1.24)
Glucose, Bld: 94 mg/dL (ref 70–99)
HCT: 36 % — ABNORMAL LOW (ref 39.0–52.0)
Hemoglobin: 12.2 g/dL — ABNORMAL LOW (ref 13.0–17.0)
Potassium: 4.2 mmol/L (ref 3.5–5.1)
Sodium: 135 mmol/L (ref 135–145)
TCO2: 28 mmol/L (ref 22–32)

## 2021-11-09 LAB — COMPREHENSIVE METABOLIC PANEL
ALT: 18 U/L (ref 0–44)
AST: 31 U/L (ref 15–41)
Albumin: 4.3 g/dL (ref 3.5–5.0)
Alkaline Phosphatase: 72 U/L (ref 38–126)
Anion gap: 10 (ref 5–15)
BUN: 25 mg/dL — ABNORMAL HIGH (ref 8–23)
CO2: 25 mmol/L (ref 22–32)
Calcium: 9.6 mg/dL (ref 8.9–10.3)
Chloride: 101 mmol/L (ref 98–111)
Creatinine, Ser: 1.24 mg/dL (ref 0.61–1.24)
GFR, Estimated: >60 mL/min (ref >60)
Glucose, Bld: 95 mg/dL (ref 70–99)
Potassium: 4.3 mmol/L (ref 3.5–5.1)
Sodium: 136 mmol/L (ref 135–145)
Total Bilirubin: 0.5 mg/dL (ref 0.3–1.2)
Total Protein: 7.4 g/dL (ref 6.5–8.1)

## 2021-11-09 LAB — URINALYSIS, ROUTINE W REFLEX MICROSCOPIC
Bilirubin Urine: NEGATIVE
Glucose, UA: NEGATIVE mg/dL
Hgb urine dipstick: NEGATIVE
Ketones, ur: NEGATIVE mg/dL
Leukocytes,Ua: NEGATIVE
Nitrite: NEGATIVE
Protein, ur: NEGATIVE mg/dL
Specific Gravity, Urine: 1.012 (ref 1.005–1.030)
pH: 6 (ref 5.0–8.0)

## 2021-11-09 LAB — CBC
HCT: 37 % — ABNORMAL LOW (ref 39.0–52.0)
Hemoglobin: 11.6 g/dL — ABNORMAL LOW (ref 13.0–17.0)
MCH: 29.6 pg (ref 26.0–34.0)
MCHC: 31.4 g/dL (ref 30.0–36.0)
MCV: 94.4 fL (ref 80.0–100.0)
Platelets: 157 10*3/uL (ref 150–400)
RBC: 3.92 MIL/uL — ABNORMAL LOW (ref 4.22–5.81)
RDW: 13.2 % (ref 11.5–15.5)
WBC: 7.7 10*3/uL (ref 4.0–10.5)
nRBC: 0 % (ref 0.0–0.2)

## 2021-11-09 LAB — SAMPLE TO BLOOD BANK

## 2021-11-09 LAB — RESP PANEL BY RT-PCR (FLU A&B, COVID) ARPGX2
Influenza A by PCR: NEGATIVE
Influenza B by PCR: NEGATIVE
SARS Coronavirus 2 by RT PCR: NEGATIVE

## 2021-11-09 LAB — PROTIME-INR
INR: 1.1 (ref 0.8–1.2)
Prothrombin Time: 14.4 seconds (ref 11.4–15.2)

## 2021-11-09 LAB — LACTIC ACID, PLASMA: Lactic Acid, Venous: 1.6 mmol/L (ref 0.5–1.9)

## 2021-11-09 MED ORDER — LIDOCAINE-EPINEPHRINE (PF) 2 %-1:200000 IJ SOLN
10.0000 mL | Freq: Once | INTRAMUSCULAR | Status: AC
  Administered 2021-11-09: 10 mL via INTRADERMAL
  Filled 2021-11-09: qty 20

## 2021-11-09 MED ORDER — ACETAMINOPHEN 500 MG PO TABS
1000.0000 mg | ORAL_TABLET | Freq: Once | ORAL | Status: AC
  Administered 2021-11-09: 1000 mg via ORAL
  Filled 2021-11-09: qty 2

## 2021-11-09 NOTE — Discharge Instructions (Addendum)
Please have the sutures removed in 5-7 days (6 sutures total). Wash the wound gently daily with soap and water. Take tylenol every 6-8 hours as needed for pain.  His injuries today included a laceration and hematoma to the left forehead as well as a large hematoma to the right hip/buttock.  His CT scan also noted a nodule in his lung that is stable from prior scans. They are recommending follow-up in 1 year to ensure this remains stable (you can discuss this with your Primary Care Doctor).
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
## 2021-11-09 NOTE — ED Triage Notes (Signed)
Pt via [facility] from [facility] as unwitnessed fall/syncopal episode on plavix, hit his head on "either door or sink." Approx 1in lac above L eye, swelling & bruising noted to same from hx of falls 5/15. Advised to use walker, per report pt "keeps forgetting to use walker." Per report, pt has no recollection of incident, but "head feels off."   VSS, NAD on arrival

## 2021-11-09 NOTE — ED Notes (Signed)
[name] (sister, emergency contact & POA) requesting updates as necessary. Contact information confirmed & up to date in chart

## 2021-11-09 NOTE — Progress Notes (Signed)
Orthopedic Tech Progress Note Patient Details:  [name] Jan 12, 1956 [id number]  Patient ID: [name], male   DOB: Oct 17, 1955, 66 y.o.   MRN: [id number] I attended trauma page. Karolee Stamps 11/09/2021, 8:15 PM
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
## 2021-11-09 NOTE — ED Provider Notes (Signed)
[facility] EMERGENCY DEPARTMENT Provider Note   CSN: [id number] Arrival date & time: 11/09/21  1925     History  Chief Complaint  Patient presents with   Lytle Michaels     Fall [name] is a 66 y.o. male with a history of AAA, seizures, HTN, TBI with resultant memory loss, nonischemic cardiomyopathy, and frequent falls presenting to the ED after a fall.  Per EMS, the fall was unwitnessed.  The patient does not remember how he fell.  However, patient was reportedly ambulating without his walker there is a concern that he had a mechanical fall.  He is currently endorsing severe headache and does have a laceration over his forehead.  Unknown loss of consciousness.  He denies any other pain or injury.     Home Medications Prior to Admission medications   Medication Sig Start Date End Date Taking? Authorizing Provider  acetaminophen (TYLENOL) 500 MG tablet Take 500-1,000 mg by mouth See admin instructions. '1000mg'$  oral daily And '500mg'$  additional oral every six hours as needed for fever, headache, pain    [provider]  aluminum-magnesium hydroxide-simethicone (MAALOX) 200-200-20 MG/5ML SUSP Take 30 mLs by mouth every 6 (six) hours as needed (heartburn/indigestion).    [provider]  aspirin EC 81 MG tablet Take 1 tablet (81 mg total) by mouth daily. 08/17/21   Ulyses Amor, PA-C  atenolol (TENORMIN) 50 MG tablet Take 1 tablet (50 mg total) by mouth daily. 06/24/21   Oswald Hillock, MD  busPIRone (BUSPAR) 10 MG tablet Take 10 mg by mouth 3 (three) times daily.    [provider]  clopidogrel (PLAVIX) 75 MG tablet Take 1 tablet (75 mg total) by mouth daily. 08/17/21   Ulyses Amor, PA-C  Ergocalciferol (VITAMIN D2) 10 MCG (400 UNIT) TABS Take 800 Units by mouth daily.    [provider]  famotidine (PEPCID) 20 MG tablet Take 1 tablet (20 mg total) by mouth daily for 1 day. Take one tablet by mouth the night before  procedure Patient not taking: Reported on 06/14/2021 02/20/21 02/28/21  Serafina Mitchell, MD  guaiFENesin (ROBITUSSIN) 100 MG/5ML liquid Take 10 mLs by mouth every 6 (six) hours as needed for cough.    [provider]  loperamide (IMODIUM) 2 MG capsule Take 2 mg by mouth as needed for diarrhea or loose stools. Do not exceed '16mg'$  in 24 hours    [provider]  magnesium hydroxide (MILK OF MAGNESIA) 400 MG/5ML suspension Take 30 mLs by mouth at bedtime as needed for mild constipation.    [provider]  mineral oil-hydrophilic petrolatum (AQUAPHOR) ointment Apply 1 application topically daily. Apply topically to arms, legs and feet daily for dry skin    [provider]  mirtazapine (REMERON) 15 MG tablet Take 15 mg by mouth at bedtime.    [provider]  Multiple Vitamins-Minerals (CERTAVITE/ANTIOXIDANTS) TABS Take 1 tablet by mouth daily.    [provider]  neomycin-bacitracin-polymyxin (NEOSPORIN) ointment Apply 1 application. topically daily as needed for wound care.    [provider]  Nutritional Supplement LIQD Take 1 Bottle by mouth 3 (three) times daily. Mighty Shake    [provider]  phenytoin (DILANTIN) 100 MG ER capsule Take 200 mg by mouth at bedtime. Along with 60 mg dose 06/23/12   Bartholomew Crews, MD  phenytoin (DILANTIN) 30 MG ER capsule Take 60 mg by mouth at bedtime. Along with 200 mg dose  [provider]  pravastatin (PRAVACHOL) 40 MG tablet Take 40 mg by mouth at bedtime.    [provider]      Allergies    Isovue [iopamidol]    Review of Systems   Review of Systems  Unable to perform ROS: Dementia   Physical Exam Updated Vital Signs BP (!) 163/74   Pulse 63   Temp 98.1 F (36.7 C) (Oral)   Resp 16   SpO2 100%  Physical Exam Constitutional:      Appearance: He is normal weight. He is not toxic-appearing or diaphoretic.     Comments: Elderly and chronically ill  appearing.  HENT:     Head:     Comments: 1.5cm laceration over the left forehead.    Mouth/Throat:     Comments: Midface is stable. Eyes:     Comments: Old periorbital ecchymosis surrounding the right eye. New appearing ecchymosis surrounding the left eye.  Neck:     Comments: No midline tenderness over the c spine, no step offs or deformities. Cardiovascular:     Rate and Rhythm: Regular rhythm. Bradycardia present.     Heart sounds: Normal heart sounds. No murmur heard.   No friction rub. No gallop.  Pulmonary:     Effort: Pulmonary effort is normal. No respiratory distress.     Breath sounds: Normal breath sounds. No stridor. No wheezing, rhonchi or rales.  Abdominal:     General: There is no distension.     Palpations: Abdomen is soft.     Tenderness: There is no abdominal tenderness. There is no guarding or rebound.  Musculoskeletal:     Cervical back: Neck supple.     Comments: Large hematoma over the right hip. Old bruising over the left knee without tenderness to palpation. Pelvis is stable to AP and lateral compression.  Skin:    General: Skin is warm and dry.  Neurological:     Mental Status: He is alert. Mental status is at baseline.    ED Results / Procedures / Treatments   Labs (all labs ordered are listed, but only abnormal results are displayed) Labs Reviewed  COMPREHENSIVE METABOLIC PANEL - Abnormal; Notable for the following components:      Result Value   BUN 25 (*)    All other components within normal limits  CBC - Abnormal; Notable for the following components:   RBC 3.92 (*)    Hemoglobin 11.6 (*)    HCT 37.0 (*)    All other components within normal limits  I-STAT CHEM 8, ED - Abnormal; Notable for the following components:   BUN 28 (*)    Calcium, Ion 1.07 (*)    Hemoglobin 12.2 (*)    HCT 36.0 (*)    All other components within normal limits  RESP PANEL BY RT-PCR (FLU A&B, COVID) ARPGX2  URINALYSIS, ROUTINE W REFLEX MICROSCOPIC  LACTIC  ACID, PLASMA  PROTIME-INR  ETHANOL  SAMPLE TO BLOOD BANK    EKG EKG Interpretation  Date/Time:  Thursday Nov 09 2021 20:30:26 EDT Ventricular Rate:  60 PR Interval:  183 QRS Duration: 84 QT Interval:  399 QTC Calculation: 399 R Axis:   30 Text Interpretation: Sinus rhythm Probable left ventricular hypertrophy Non-specific ST-t changes Confirmed by Lajean Saver [phone]) on 11/09/2021 8:35:58 PM  Radiology CT HEAD WO CONTRAST  Result Date: 11/09/2021 CLINICAL DATA:  Recent fall with headaches and neck pain, initial encounter EXAM: CT HEAD WITHOUT CONTRAST CT CERVICAL SPINE WITHOUT CONTRAST TECHNIQUE: Multidetector  CT imaging of the head and cervical spine was performed following the standard protocol without intravenous contrast. Multiplanar CT image reconstructions of the cervical spine were also generated. RADIATION DOSE REDUCTION: This exam was performed according to the departmental dose-optimization program which includes automated exposure control, adjustment of the mA and/or kV according to patient size and/or use of iterative reconstruction technique. COMPARISON:  11/07/2021 FINDINGS: CT HEAD FINDINGS Brain: No evidence of acute infarction, hemorrhage, hydrocephalus, extra-axial collection or mass lesion/mass effect. Mild atrophic changes are noted. Lacunar infarcts are noted in the left corona radiata as well as the basal ganglia on the right. Vascular: No hyperdense vessel or unexpected calcification. Skull: Normal. Negative for fracture or focal lesion. Sinuses/Orbits: No acute finding. Other: Forehead swelling on the left is noted consistent with the recent injury. CT CERVICAL SPINE FINDINGS Alignment: Mild retrolisthesis of C4 on C5 and C5 on C6 is noted of a degenerative nature. Skull base and vertebrae: Multilevel osteophytic changes and facet hypertrophic changes are seen. No acute fracture or acute facet abnormality is noted. Soft tissues and spinal canal: Surrounding soft tissue  structures are within normal limits. Upper chest: Aortic stent graft is seen. Other: None IMPRESSION: CT of the head: Chronic atrophic and ischemic changes without acute abnormality. Left forehead swelling consistent with the recent injury. CT of the cervical spine: Multilevel degenerative change as described. No acute abnormality noted. Electronically Signed   By: Inez Catalina M.D.   On: 11/09/2021 21:20   CT CERVICAL SPINE WO CONTRAST  Result Date: 11/09/2021 CLINICAL DATA:  Recent fall with headaches and neck pain, initial encounter EXAM: CT HEAD WITHOUT CONTRAST CT CERVICAL SPINE WITHOUT CONTRAST TECHNIQUE: Multidetector CT imaging of the head and cervical spine was performed following the standard protocol without intravenous contrast. Multiplanar CT image reconstructions of the cervical spine were also generated. RADIATION DOSE REDUCTION: This exam was performed according to the departmental dose-optimization program which includes automated exposure control, adjustment of the mA and/or kV according to patient size and/or use of iterative reconstruction technique. COMPARISON:  11/07/2021 FINDINGS: CT HEAD FINDINGS Brain: No evidence of acute infarction, hemorrhage, hydrocephalus, extra-axial collection or mass lesion/mass effect. Mild atrophic changes are noted. Lacunar infarcts are noted in the left corona radiata as well as the basal ganglia on the right. Vascular: No hyperdense vessel or unexpected calcification. Skull: Normal. Negative for fracture or focal lesion. Sinuses/Orbits: No acute finding. Other: Forehead swelling on the left is noted consistent with the recent injury. CT CERVICAL SPINE FINDINGS Alignment: Mild retrolisthesis of C4 on C5 and C5 on C6 is noted of a degenerative nature. Skull base and vertebrae: Multilevel osteophytic changes and facet hypertrophic changes are seen. No acute fracture or acute facet abnormality is noted. Soft tissues and spinal canal: Surrounding soft tissue  structures are within normal limits. Upper chest: Aortic stent graft is seen. Other: None IMPRESSION: CT of the head: Chronic atrophic and ischemic changes without acute abnormality. Left forehead swelling consistent with the recent injury. CT of the cervical spine: Multilevel degenerative change as described. No acute abnormality noted. Electronically Signed   By: Inez Catalina M.D.   On: 11/09/2021 21:20   DG Pelvis Portable  Result Date: 11/09/2021 CLINICAL DATA:  Fall. EXAM: PORTABLE PELVIS 1-2 VIEWS COMPARISON:  Pelvis x-ray 06/14/2021 FINDINGS: Left femoral intramedullary nail partially visualized. There is no evidence for hardware loosening. Callus formation is seen surrounding the mid femur and there is heterotopic ossification near the neck of the femur, unchanged. There is  no acute fracture or dislocation. There are mild degenerative changes of both hips. Joint spaces are maintained. There is a vascular stent in the right lower extremity. IMPRESSION: 1. No acute fracture or dislocation. 2. Left femoral intramedullary nail, partially visualized, appears uncomplicated. Electronically Signed   By: Ronney Asters M.D.   On: 11/09/2021 19:49   DG Chest Port 1 View  Result Date: 11/09/2021 CLINICAL DATA:  Trauma. EXAM: PORTABLE CHEST 1 VIEW COMPARISON:  Chest x-ray 07/01/2020 FINDINGS: Thoracic aortic graft is again noted. Cardiomediastinal silhouette appears within normal limits and stable. Both lungs are clear. No acute fractures are seen. There are chronic changes of the left shoulder. IMPRESSION: No acute cardiopulmonary process. Electronically Signed   By: Ronney Asters M.D.   On: 11/09/2021 19:47   CT CHEST ABDOMEN PELVIS WO CONTRAST  Result Date: 11/09/2021 CLINICAL DATA:  Blunt poly trauma, fall injury, on blood thinners. EXAM: CT CHEST, ABDOMEN AND PELVIS WITHOUT CONTRAST TECHNIQUE: Multidetector CT imaging of the chest, abdomen and pelvis was performed following the standard protocol without  IV contrast. RADIATION DOSE REDUCTION: This exam was performed according to the departmental dose-optimization program which includes automated exposure control, adjustment of the mA and/or kV according to patient size and/or use of iterative reconstruction technique. COMPARISON:  Chest CTs without contrast 11/07/2021 and 07/06/2021, and the CTA abdomen and pelvis 06/20/2021. FINDINGS: CT CHEST FINDINGS Cardiovascular: The cardiac size is normal. There are patchy calcifications in the LAD coronary artery. There is aortic atherosclerosis, stable diffuse aneurysmal dilatation in the descending segment and stable appearance of thoracic aortic stent beginning in the distal arch, descending tortuosity unchanged. Scattered calcific plaque in the great vessels. Pulmonary arteries and veins are normal caliber and no pericardial effusion is seen. Mediastinum/Nodes: No enlarged mediastinal, hilar, or axillary lymph nodes. Thyroid gland, trachea, and esophagus demonstrate no significant findings. Mucoid septation incidentally noted distal trachea, right main bronchus. No mediastinal hematoma or pneumomediastinum. Lungs/Pleura: Mild centrilobular and paraseptal emphysematous change both upper lobes few scattered air cysts in the bases. Mild subsegmental atelectasis posterior right lower lobe is again noted and a few linear scar-like opacities in both bases. Posterior right upper lobe irregular nodular opacity on 5:64 again measures 1.5 x 0.6 cm has remained unchanged in size dating back to a chest CT from 06/23/2020 and 03/31/2018. The lungs are otherwise generally clear. There is no pleural effusion, thickening or pneumothorax. Musculoskeletal: There are chronic healed fracture deformities of the distal left clavicle and several left ribs. No spinal compression fracture is seen and no acute regional skeletal abnormality suspected. CT ABDOMEN PELVIS FINDINGS Hepatobiliary: No hepatic injury or perihepatic hematoma. Gallbladder  is unremarkable. No focal liver masses seen without contrast and allowing for streak artifact from the patient's overlying arms. Pancreas: Unremarkable without contrast. Spleen: Unremarkable without contrast and allowing for streak artifact from the patient's overlying arms. Adrenals/Urinary Tract: 1.4 cm chronic stable right adrenal adenoma. No focal left adrenal abnormality. No adrenal hemorrhage or acute renal injury suspected. No masses seen without contrast. There are extrarenal pelves. There is no urinary stone or obstruction. There is no bladder thickening. Stomach/Bowel: No dilatation or wall thickening. An appendix is not seen. Moderate fecal stasis. Left colonic diverticulosis without evidence of diverticulitis. Vascular/Lymphatic: The aorta, iliac arteries are heavily calcified. There is no AAA. There is no adenopathy. Reproductive: There is no prostatomegaly. Other: There is no free air, hemorrhage or fluid. There is no incarcerated hernia. Scattered pelvic phleboliths. There is moderate stranding in the right  posterolateral buttock soft tissues likely posttraumatic. Musculoskeletal: Left femoral intramedullary rod is partially visible. No regional skeletal fracture suspected. There are chronic healed fracture deformities of the left pubic rami. Degenerative change of the lumbar spine with the most advanced facet hypertrophy at L4-5 with grade 1 anterolisthesis at this level stable. IMPRESSION: 1. Moderate soft tissue stranding in the posterolateral right buttock likely posttraumatic, less likely cellulitis. 2. No further acute trauma related findings in the chest, abdomen or pelvis. 3. Stable 1.5 x 0.6 cm irregular flattened right upper lobe nodule, probable nodular scarring and unchanged at least as far back as 03/31/2018. Twelve month follow-up study recommended to ensure continued stability. 4. Aortic and coronary artery atherosclerosis with stable appearance of descending aortic stent graft repair  and diffuse descending aortic dilatation. 5. Degenerative changes and old healed fracture deformities as above. 6. Additional chronic change, constipation and diverticulosis. Electronically Signed   By: Telford Nab M.D.   On: 11/09/2021 21:48    Procedures .Marland KitchenLaceration Repair  Date/Time: 11/09/2021 10:42 PM Performed by: Sondra Come, MD Authorized by: Lajean Saver, MD   Consent:    Consent obtained:  Verbal   Consent given by:  Patient (sister)   Risks, benefits, and alternatives were discussed: yes     Risks discussed:  Infection, pain, need for additional repair and poor cosmetic result   Alternatives discussed:  No treatment Anesthesia:    Anesthesia method:  Local infiltration   Local anesthetic:  Lidocaine 2% WITH epi Laceration details:    Location:  Face   Face location:  Forehead   Length (cm):  1.5 Pre-procedure details:    Preparation:  Patient was prepped and draped in usual sterile fashion and imaging obtained to evaluate for foreign bodies Exploration:    Limited defect created (wound extended): no     Contaminated: no   Treatment:    Area cleansed with:  Saline   Amount of cleaning:  Standard   Irrigation solution:  Sterile saline   Irrigation method:  Syringe   Debridement:  None   Undermining:  None Skin repair:    Repair method:  Sutures   Suture size:  5-0   Suture material:  Prolene   Suture technique:  Simple interrupted   Number of sutures:  6 Approximation:    Approximation:  Close Repair type:    Repair type:  Simple Post-procedure details:    Dressing:  Open (no dressing)   Procedure completion:  Tolerated   Medications Ordered in ED Medications  acetaminophen (TYLENOL) tablet 1,000 mg (1,000 mg Oral Given 11/09/21 2214)  lidocaine-EPINEPHrine (XYLOCAINE W/EPI) 2 %-1:200000 (PF) injection 10 mL (10 mLs Intradermal Given 11/09/21 2214)    ED Course/ Medical Decision Making/ A&P                           Medical Decision  Making Amount and/or Complexity of Data Reviewed Labs: ordered. Radiology: ordered. ECG/medicine tests: ordered.  Risk OTC drugs. Prescription drug management.   [name] is a 66 y.o. male with a history of AAA, seizures, HTN, TBI, nonischemic cardiomyopathy, dementia, and frequent falls presenting to the ED after a fall.  On exam, the patient is afebrile and hemodynamically stable.  He does have a laceration to the left forehead with some surrounding ecchymosis extending periorbitally around the left eye.  He has some old ecchymosis surrounding the right eye.  He also has a hematoma over the right hip (unclear  if this is from his most recent fall or from his fall last week).  No other obvious injuries.  Given the large hematoma over the hip as well as his head trauma, will obtain CT head, CT C-spine, and CT chest/abdomen/pelvis.  Patient does not remember the fall, but it does appear he was trying to ambulate without his walker.  He has had multiple falls over the past several months related to not using his walker (patient often forgets in the setting of his TBI and short-term memory problems).  This fall was likely mechanical in nature given he was not utilizing his walker.  However, ECG was obtained which did not show any acute ischemic changes - showed a sinus rhythm with changes consistent with likely left ventricular hypertrophy.  Patient given Tylenol for pain.  UA is not concerning for infection.  CMP unremarkable.  CBC without leukocytosis and with a hemoglobin 11.6.  Lactic acid is normal.  CT head without acute intracranial abnormality, does show some left forehead swelling.  No acute fracture of the sinuses or orbits.  CT of the cervical spine without acute traumatic abnormality.  CT of the chest/abdomen/pelvis does show some moderate soft tissue stranding over the right hip/buttock consistent with his hematoma.  I think this is likely from his fall last week given that  he had fallen on his right side at that time.  Hematoma was monitored in the ED and does not appear to be expanding and has no active extravasation on CT.  He otherwise has no acute traumatic findings on CT.  The forehead laceration was repaired as per the procedure note above.  Patient tolerated the procedure well.  I spoke with the patient's sister at bedside regarding the patient's frequent falls.  She states that she is working with his facility to increase his supervision and to have someone available to remind him to use his walker at all times.  I discussed with her the importance of having the sutures removed in about 5-7 days and she verbalized understanding.  Also given both verbal and written instructions for wound care.  Patient was able to ambulate with a walker which is his baseline.  Patient sister is comfortable taking him back to his facility and will continue to work with them to increase his supervision.  Strict return precautions were discussed and the patient was discharged home in stable condition.  Final Clinical Impression(s) / ED Diagnoses Final diagnoses:  Fall, initial encounter  Facial laceration, initial encounter    Rx / DC Orders ED Discharge Orders     None         Sondra Come, MD 11/09/21 2308    Lajean Saver, MD 11/10/21 1559

## 2021-11-19 NOTE — Progress Notes (Signed)
Ty,   Please let patient know nodule is stable. No additional follow up needed. Nodule appears inflammatory.   Thanks,  BLI  Garner Nash, DO [facility] 11/19/2021 5:29 PM

## 2021-12-15 ENCOUNTER — Ambulatory Visit (INDEPENDENT_AMBULATORY_CARE_PROVIDER_SITE_OTHER): Payer: Medicaid Other | Admitting: Podiatry

## 2021-12-15 ENCOUNTER — Encounter: Payer: Self-pay | Admitting: Podiatry

## 2021-12-15 DIAGNOSIS — M79674 Pain in right toe(s): Secondary | ICD-10-CM | POA: Diagnosis not present

## 2021-12-15 DIAGNOSIS — M79675 Pain in left toe(s): Secondary | ICD-10-CM | POA: Diagnosis not present

## 2021-12-15 DIAGNOSIS — L84 Corns and callosities: Secondary | ICD-10-CM

## 2021-12-15 DIAGNOSIS — I739 Peripheral vascular disease, unspecified: Secondary | ICD-10-CM

## 2021-12-15 DIAGNOSIS — L601 Onycholysis: Secondary | ICD-10-CM

## 2021-12-15 DIAGNOSIS — B351 Tinea unguium: Secondary | ICD-10-CM

## 2021-12-25 NOTE — Progress Notes (Signed)
  Subjective:  Patient ID: [name], male    DOB: 1956/06/10,  MRN: [id number]  [name] presents to clinic today for for at risk foot care. Patient has h/o PAD and callus(es) left lower extremity and painful thick toenails that are difficult to trim. Painful toenails interfere with ambulation. Aggravating factors include wearing enclosed shoe gear. Pain is relieved with periodic professional debridement. Painful calluses are aggravated when weightbearing with and without shoegear. Pain is relieved with periodic professional debridement.  Patient is accompanied by his sister on today's visit. Sister states he had two falls last month.  He is followed by Vascular Surgery and was last seen 08/17/2021.  New problem(s): None.   PCP is Kurth-Bowen, Cornelia, PA-C , and last visit was last week.  Allergies  Allergen Reactions   Isovue [Iopamidol] Hives    Pt broke out in one hive on his chest after contrast on 01/03/15.  Pt will need full premeds in the future per Dr Martinique.      Review of Systems: Negative except as noted in the HPI.  Objective: No changes noted in today's physical examination.  Vascular Examination: CFT delayed b/l LE. Diminished DP/PT pulses b/l LE. Pedal hair absent. No pain with calf compression b/l. Lower extremity skin temperature gradient warm to cool. No edema noted b/l LE. No ischemia or gangrene noted b/l LE. No cyanosis or clubbing noted b/l LE.   Dermatological Examination: Pedal skin thin, shiny and atrophic b/l LE. No open wounds b/l LE. No interdigital macerations noted b/l LE. Toenails 1-5 b/l elongated, discolored, dystrophic, thickened, crumbly with subungual debris and tenderness to dorsal palpation. Hyperkeratotic lesion(s) L hallux.  No erythema, no edema, no drainage, no fluctuance. There is noted onchyolysis of entire nailplate of the right 5th toenail.  The nailbed remains intact. There is no erythema, no edema, no drainage, no underlying  fluctuance.  Neurological Examination: Protective sensation intact 5/5 intact bilaterally with 10g monofilament b/l. Vibratory sensation intact b/l. Proprioception intact bilaterally.   Musculoskeletal Examination: Normal muscle strength 5/5 to all lower extremity muscle groups bilaterally. No pain, crepitus or joint limitation noted with ROM b/l LE. No gross bony pedal deformities b/l. Patient ambulates independently without assistive aids.   Assessment/Plan: 1. Pain due to onychomycosis of toenail   2. Onycholysis of toenail   3. Callus   4. PAD (peripheral artery disease) ([place])   -Examined patient. -Patient to continue soft, supportive shoe gear daily. -Toenails 1-4 bilaterally and L 4th toe debrided in length and girth without iatrogenic bleeding with sterile nail nipper and dremel. Right 5th digit nailplate gently debrided from it's remaining attachment to digit. Nailbed cleansed with alcohol. Triple antibiotic ointment applied. No further treatment required. -Callus(es) left great toe pared utilizing sterile scalpel blade without complication or incident. Total number debrided =1. -Patient/POA to call should there be question/concern in the interim.   Return in about 3 months (around 03/17/2022).  Marzetta Board, DPM
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
## 2022-01-12 ENCOUNTER — Ambulatory Visit (HOSPITAL_COMMUNITY)
Admission: RE | Admit: 2022-01-12 | Discharge: 2022-01-12 | Disposition: A | Discharge status: Home / Self Care | Payer: Medicaid Other | Source: Ambulatory Visit | Attending: Surgery | Admitting: Surgery

## 2022-01-12 ENCOUNTER — Ambulatory Visit (INDEPENDENT_AMBULATORY_CARE_PROVIDER_SITE_OTHER)
Admission: RE | Admit: 2022-01-12 | Discharge: 2022-01-12 | Disposition: A | Discharge status: Home / Self Care | Payer: Medicaid Other | Source: Ambulatory Visit | Attending: Surgery | Admitting: Surgery

## 2022-01-12 DIAGNOSIS — I739 Peripheral vascular disease, unspecified: Secondary | ICD-10-CM | POA: Insufficient documentation

## 2022-01-15 ENCOUNTER — Encounter (HOSPITAL_COMMUNITY): Payer: Medicaid Other

## 2022-01-15 ENCOUNTER — Encounter: Payer: Self-pay | Admitting: Physician Assistant

## 2022-01-15 ENCOUNTER — Ambulatory Visit (INDEPENDENT_AMBULATORY_CARE_PROVIDER_SITE_OTHER): Payer: Medicaid Other | Admitting: Physician Assistant

## 2022-01-15 VITALS — BP 116/62 | HR 66 | Temp 98.0°F | Resp 16 | Ht 65.0 in | Wt 106.0 lb

## 2022-01-15 DIAGNOSIS — F17200 Nicotine dependence, unspecified, uncomplicated: Secondary | ICD-10-CM

## 2022-01-15 DIAGNOSIS — I739 Peripheral vascular disease, unspecified: Secondary | ICD-10-CM | POA: Diagnosis not present

## 2022-01-16 ENCOUNTER — Other Ambulatory Visit: Payer: Self-pay

## 2022-01-16 DIAGNOSIS — I739 Peripheral vascular disease, unspecified: Secondary | ICD-10-CM

## 2022-01-25 ENCOUNTER — Other Ambulatory Visit: Payer: Self-pay

## 2022-01-25 ENCOUNTER — Encounter (HOSPITAL_COMMUNITY): Payer: Self-pay | Admitting: Emergency Medicine

## 2022-01-25 ENCOUNTER — Emergency Department (HOSPITAL_COMMUNITY)
Admission: EM | Admit: 2022-01-25 | Discharge: 2022-01-25 | Disposition: A | Discharge status: Home / Self Care | Payer: Medicaid Other | Attending: Emergency Medicine | Admitting: Emergency Medicine

## 2022-01-25 ENCOUNTER — Emergency Department (HOSPITAL_COMMUNITY): Payer: Medicaid Other

## 2022-01-25 DIAGNOSIS — R519 Headache, unspecified: Secondary | ICD-10-CM | POA: Diagnosis not present

## 2022-01-25 DIAGNOSIS — Z7982 Long term (current) use of aspirin: Secondary | ICD-10-CM | POA: Diagnosis not present

## 2022-01-25 DIAGNOSIS — S90811A Abrasion, right foot, initial encounter: Secondary | ICD-10-CM | POA: Insufficient documentation

## 2022-01-25 DIAGNOSIS — Z79899 Other long term (current) drug therapy: Secondary | ICD-10-CM | POA: Insufficient documentation

## 2022-01-25 DIAGNOSIS — Z7902 Long term (current) use of antithrombotics/antiplatelets: Secondary | ICD-10-CM | POA: Insufficient documentation

## 2022-01-25 DIAGNOSIS — W19XXXA Unspecified fall, initial encounter: Secondary | ICD-10-CM | POA: Insufficient documentation

## 2022-01-25 DIAGNOSIS — I10 Essential (primary) hypertension: Secondary | ICD-10-CM | POA: Insufficient documentation

## 2022-01-25 DIAGNOSIS — S99921A Unspecified injury of right foot, initial encounter: Secondary | ICD-10-CM | POA: Diagnosis present

## 2022-01-25 LAB — I-STAT CHEM 8, ED
BUN: 25 mg/dL — ABNORMAL HIGH (ref 8–23)
Calcium, Ion: 1.09 mmol/L — ABNORMAL LOW (ref 1.15–1.40)
Chloride: 102 mmol/L (ref 98–111)
Creatinine, Ser: 1.4 mg/dL — ABNORMAL HIGH (ref 0.61–1.24)
Glucose, Bld: 162 mg/dL — ABNORMAL HIGH (ref 70–99)
HCT: 41 % (ref 39.0–52.0)
Hemoglobin: 13.9 g/dL (ref 13.0–17.0)
Potassium: 3.7 mmol/L (ref 3.5–5.1)
Sodium: 138 mmol/L (ref 135–145)
TCO2: 24 mmol/L (ref 22–32)

## 2022-01-25 NOTE — ED Triage Notes (Signed)
Pt in from [facility], mechanical fall on Plavix, landed on hardwood floor. Found down per staff, pt reporting he does not know if +LOC, but also has dementia. Denies any head or neck pain, but has lac to bottom R foot, wrapped in gauze. Hx of head bleed in December. HTN en route - 229/140. Sister present with pt, states he seems at baseline. No neuro deficits noted on ED arrival.

## 2022-01-25 NOTE — ED Notes (Addendum)
Patient verbalizes understanding of discharge instructions. Opportunity for questioning and answers were provided. Armband removed by staff, pt discharged from ED. Wheeled out to lobby with sister, and she will drive him back to his assisted living facility.

## 2022-01-25 NOTE — ED Provider Notes (Signed)
[facility] EMERGENCY DEPARTMENT Provider Note   CSN: [id number] Arrival date & time: 01/25/22  6237     History  Chief Complaint  Patient presents with   Fall on Plavix    [name] is a 66 y.o. male.  HPI  Medical history including hypertension, aortic aneurysm status postrepair 2015, AAA, TBI, stenting of the right SFA, presents from nursing facility after a fall.  Patients sister is at bedside able to provide HPI, patient had unwitnessed fall, patient went to bed and nursing staff found him on the floor, unclear if he hit his head, originally he was endorsing pain on his left leg but currently has no complaints at this time.  Patient walks with a walker, sister states that he is acting his normal self, no change in mental status.    Home Medications Prior to Admission medications   Medication Sig Start Date End Date Taking? Authorizing Provider  acetaminophen (TYLENOL) 500 MG tablet Take 500-1,000 mg by mouth See admin instructions. '1000mg'$  oral daily And '500mg'$  additional oral every six hours as needed for fever, headache, pain    [provider]  aluminum-magnesium hydroxide-simethicone (MAALOX) 200-200-20 MG/5ML SUSP Take 30 mLs by mouth every 6 (six) hours as needed (heartburn/indigestion).    [provider]  aspirin EC 81 MG tablet Take 1 tablet (81 mg total) by mouth daily. 08/17/21   Ulyses Amor, PA-C  atenolol (TENORMIN) 50 MG tablet Take 1 tablet (50 mg total) by mouth daily. 06/24/21   Oswald Hillock, MD  busPIRone (BUSPAR) 10 MG tablet Take 10 mg by mouth 3 (three) times daily.    [provider]  clopidogrel (PLAVIX) 75 MG tablet Take 1 tablet (75 mg total) by mouth daily. 08/17/21   Ulyses Amor, PA-C  Ergocalciferol (VITAMIN D2) 10 MCG (400 UNIT) TABS Take 800 Units by mouth daily.    [provider]  famotidine (PEPCID) 20 MG tablet Take 1 tablet (20 mg total) by mouth daily for 1 day. Take one tablet  by mouth the night before procedure Patient not taking: Reported on 06/14/2021 02/20/21 02/28/21  Serafina Mitchell, MD  guaiFENesin (ROBITUSSIN) 100 MG/5ML liquid Take 10 mLs by mouth every 6 (six) hours as needed for cough.    [provider]  loperamide (IMODIUM) 2 MG capsule Take 2 mg by mouth as needed for diarrhea or loose stools. Do not exceed '16mg'$  in 24 hours    [provider]  magnesium hydroxide (MILK OF MAGNESIA) 400 MG/5ML suspension Take 30 mLs by mouth at bedtime as needed for mild constipation.    [provider]  mineral oil-hydrophilic petrolatum (AQUAPHOR) ointment Apply 1 application topically daily. Apply topically to arms, legs and feet daily for dry skin    [provider]  mirtazapine (REMERON) 15 MG tablet Take 15 mg by mouth at bedtime.    [provider]  Multiple Vitamins-Minerals (CERTAVITE/ANTIOXIDANTS) TABS Take 1 tablet by mouth daily.    [provider]  neomycin-bacitracin-polymyxin (NEOSPORIN) ointment Apply 1 application. topically daily as needed for wound care.    [provider]  Nutritional Supplement LIQD Take 1 Bottle by mouth 3 (three) times daily. Mighty Shake    [provider]  phenytoin (DILANTIN) 100 MG ER capsule Take 200 mg by mouth at bedtime. Along with 60 mg dose 06/23/12   Bartholomew Crews, MD  phenytoin (DILANTIN) 30 MG ER capsule Take 60 mg by mouth at bedtime.  Along with 200 mg dose    [provider]  pravastatin (PRAVACHOL) 40 MG tablet Take 40 mg by mouth at bedtime.    [provider]      Allergies    Isovue [iopamidol]    Review of Systems   Review of Systems  Unable to perform ROS: Patient nonverbal    Physical Exam Updated Vital Signs BP (!) 185/91   Pulse 79   Temp 97.9 F (36.6 C) (Oral)   Resp 18   Wt 48.1 kg   SpO2 100%   BMI 17.65 kg/m  Physical Exam Vitals and nursing note reviewed.  Constitutional:      General: He is  not in acute distress.    Appearance: He is not ill-appearing.  HENT:     Head: Normocephalic and atraumatic.     Comments: No deformity the head present no raccoon eyes or battle sign noted.    Nose: No congestion.     Mouth/Throat:     Mouth: Mucous membranes are moist.     Pharynx: Oropharynx is clear.     Comments: No trismus no torticollis no oral trauma Eyes:     Extraocular Movements: Extraocular movements intact.     Conjunctiva/sclera: Conjunctivae normal.  Cardiovascular:     Rate and Rhythm: Normal rate and regular rhythm.     Pulses: Normal pulses.     Heart sounds: No murmur heard.    No friction rub. No gallop.  Pulmonary:     Effort: No respiratory distress.     Breath sounds: No wheezing, rhonchi or rales.  Abdominal:     Palpations: Abdomen is soft.     Tenderness: There is no abdominal tenderness. There is no right CVA tenderness or left CVA tenderness.  Musculoskeletal:     Comments: Spine palpated nontender to palpation no step-off or deformities noted.  No pelvis instability no leg shortening, moving all 4 extremities all extremities were nontender during my palpation.  Skin:    General: Skin is warm and dry.     Comments: Small superficial skin abrasion noted on the anterior aspect of the right foot hemodynamically stable.  Neurological:     Mental Status: He is alert.     Comments: No facial asymmetry, no unilateral weakness, patient is able to ambulate with a walker.  Psychiatric:        Mood and Affect: Mood normal.     ED Results / Procedures / Treatments   Labs (all labs ordered are listed, but only abnormal results are displayed) Labs Reviewed  I-STAT CHEM 8, ED - Abnormal; Notable for the following components:      Result Value   BUN 25 (*)    Creatinine, Ser 1.40 (*)    Glucose, Bld 162 (*)    Calcium, Ion 1.09 (*)    All other components within normal limits    EKG None  Radiology CT Head Wo Contrast  Result Date:  01/25/2022 CLINICAL DATA:  Fall, head and neck trauma. EXAM: CT HEAD WITHOUT CONTRAST CT CERVICAL SPINE WITHOUT CONTRAST TECHNIQUE: Multidetector CT imaging of the head and cervical spine was performed following the standard protocol without intravenous contrast. Multiplanar CT image reconstructions of the cervical spine were also generated. RADIATION DOSE REDUCTION: This exam was performed according to the departmental dose-optimization program which includes automated exposure control, adjustment of the mA and/or kV according to patient size and/or use of iterative reconstruction technique. COMPARISON:  11/09/2021. FINDINGS: CT HEAD FINDINGS Brain:  No acute intracranial hemorrhage, midline shift or mass effect. No extra-axial fluid collection. Generalized atrophy is noted. Periventricular white matter hypodensities are present bilaterally. No hydrocephalus. An old lacunar infarct is noted in the corona radiata on the left. There is an old lacunar infarct in the left cerebellar hemisphere. An old infarct is noted in the frontal lobe on the right. Vascular: Atherosclerotic calcification of the carotid siphons. No hyperdense vessel. Skull: Normal. Negative for fracture or focal lesion. Sinuses/Orbits: No acute finding. Other: There is a scalp hematoma over the parietal bone on the right. CT CERVICAL SPINE FINDINGS Alignment: There is retrolisthesis at L4-L5, not significantly changed from the prior exam. No evidence of perched facet. Skull base and vertebrae: No acute fracture. No primary bone lesion or focal pathologic process. Soft tissues and spinal canal: No prevertebral fluid or swelling. No visible canal hematoma. Disc levels: Multilevel intervertebral disc space narrowing, endplate osteophyte formation, disc herniations, and facet arthropathy resulting in moderate to severe spinal canal stenosis, most pronounced at C3-C4 and C4-C5. Upper chest: Negative. Other: Carotid artery calcifications. IMPRESSION: 1. No  acute intracranial hemorrhage. 2. Scalp hematoma over the parietal bone on the right. 3. 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 4. Multilevel degenerative changes in the cervical spine without evidence of acute fracture. Electronically Signed   By: Brett Fairy M.D.   On: 01/25/2022 05:00   CT Cervical Spine Wo Contrast  Result Date: 01/25/2022 CLINICAL DATA:  Fall, head and neck trauma. EXAM: CT HEAD WITHOUT CONTRAST CT CERVICAL SPINE WITHOUT CONTRAST TECHNIQUE: Multidetector CT imaging of the head and cervical spine was performed following the standard protocol without intravenous contrast. Multiplanar CT image reconstructions of the cervical spine were also generated. RADIATION DOSE REDUCTION: This exam was performed according to the departmental dose-optimization program which includes automated exposure control, adjustment of the mA and/or kV according to patient size and/or use of iterative reconstruction technique. COMPARISON:  11/09/2021. FINDINGS: CT HEAD FINDINGS Brain: No acute intracranial hemorrhage, midline shift or mass effect. No extra-axial fluid collection. Generalized atrophy is noted. Periventricular white matter hypodensities are present bilaterally. No hydrocephalus. An old lacunar infarct is noted in the corona radiata on the left. There is an old lacunar infarct in the left cerebellar hemisphere. An old infarct is noted in the frontal lobe on the right. Vascular: Atherosclerotic calcification of the carotid siphons. No hyperdense vessel. Skull: Normal. Negative for fracture or focal lesion. Sinuses/Orbits: No acute finding. Other: There is a scalp hematoma over the parietal bone on the right. CT CERVICAL SPINE FINDINGS Alignment: There is retrolisthesis at L4-L5, not significantly changed from the prior exam. No evidence of perched facet. Skull base and vertebrae: No acute fracture. No primary bone lesion or focal pathologic process. Soft tissues  and spinal canal: No prevertebral fluid or swelling. No visible canal hematoma. Disc levels: Multilevel intervertebral disc space narrowing, endplate osteophyte formation, disc herniations, and facet arthropathy resulting in moderate to severe spinal canal stenosis, most pronounced at C3-C4 and C4-C5. Upper chest: Negative. Other: Carotid artery calcifications. IMPRESSION: 1. No acute intracranial hemorrhage. 2. Scalp hematoma over the parietal bone on the right. 3. 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 4. Multilevel degenerative changes in the cervical spine without evidence of acute fracture. Electronically Signed   By: Brett Fairy M.D.   On: 01/25/2022 05:00    Procedures Procedures    Medications Ordered in ED Medications - No data to display  ED Course/  Medical Decision Making/ A&P                           Medical Decision Making Amount and/or Complexity of Data Reviewed Radiology: ordered.   This patient presents to the ED for concern of fall, this involves an extensive number of treatment options, and is a complaint that carries with it a high risk of complications and morbidity.  The differential diagnosis includes orthopedic injury, intracranial head bleed, spinal fracture    Additional history obtained:  Additional history obtained from sister at bedside External records from outside source obtained and reviewed including vascular notes   Co morbidities that complicate the patient evaluation  TBI, aneurysms, PAD  Social Determinants of Health:  Nonverbal    Lab Tests:  I Ordered, and personally interpreted labs.  The pertinent results include: I-STAT Chem-8 reveals BUN of 25 creatinine 1.4 glucose 162   Imaging Studies ordered:  I ordered imaging studies including CT head, C-spine I independently visualized and interpreted imaging which showed imaging negative for acute findings I agree with the radiologist  interpretation   Cardiac Monitoring:  The patient was maintained on a cardiac monitor.  I personally viewed and interpreted the cardiac monitored which showed an underlying rhythm of: N/A   Medicines ordered and prescription drug management:  I ordered medication including N/A I have reviewed the patients home medicines and have made adjustments as needed  Critical Interventions:  N/A   Reevaluation:  Presents after a fall, benign physical exam, will obtain CT imaging of head and neck and reassess  Patient is reassessed resting comfortably, he is ambulating with a walker, daughter states he is at his baseline, agreement with plan discharge at this time.  Consultations Obtained:  N/A    Test Considered:  CT chest abdomen  will defer as my suspicion for intrathoracic/intra-abdominal trauma is low at this time no evidence of trauma on my exam both which are nontender.    Rule out low suspicion for intracranial head bleed as patient denies loss of conscious, is not on anticoagulant, no focal deficits present on my exam, CT head negative acute findings.  Low suspicion for spinal cord abnormality or spinal fracture spine was palpated was nontender to palpation, patient has full range of motion in the upper and lower extremities CT neck negative acute findings.    Dispostion and problem list  After consideration of the diagnostic results and the patients response to treatment, I feel that the patent would benefit from discharge.  Fall-mechanical nature, will recommend over-the-counter pain medication as needed, follow-up PCP for further evaluation and strict return precautions.            Final Clinical Impression(s) / ED Diagnoses Final diagnoses:  Fall, initial encounter    Rx / DC Orders ED Discharge Orders     None         Marcello Fennel, PA-C 01/25/22 0535    Ripley Fraise, MD 01/25/22 [phone]

## 2022-01-25 NOTE — Discharge Instructions (Signed)
Exam and imaging were reassuring.  You have a small abrasion on your right foot, please keep the area clean, I recommend Neosporin over the area and changing the dressings at least once daily.  Please follow-up with PCP as needed.  Come back to the emergency department if you develop chest pain, shortness of breath, severe abdominal pain, uncontrolled nausea, vomiting, diarrhea.

## 2022-01-26 ENCOUNTER — Telehealth: Payer: Self-pay | Admitting: *Deleted

## 2022-01-26 NOTE — Telephone Encounter (Signed)
Daughter, [name] had called stating patient legs have become "weaker" at times and she thinks he has had another fall.  An xray is to be taken today by a mobile unit.  Sister will see that report is sent to Dr Trula Slade.

## 2022-02-02 ENCOUNTER — Telehealth: Payer: Self-pay

## 2022-02-02 NOTE — Telephone Encounter (Signed)
Returned call to pt's sister [name] in regards to pt's upcoming surgery on 8/15. [name] stated that pt is experiencing LE weakness and is no longer able to walk without assistance. She states pt is now using a wheelchair where as before he was able to ambulate with a walker. She reports that pt had a fall last Thursday and was sent to the ED. [name] stated that the workup included a xray which was normal and was told by the physician at the SNF that he should continue with the scheduled procedure. I advised that I would leave the procedure scheduled and notify Dr. Trula Slade to decide whether or not to proceed. I explained that someone from our office would reach out on Monday if procedure needed to be canceled. [name] verbalized understanding and agreed with this plan. Dr. Nechama Guard was notified via staff message.

## 2022-02-06 ENCOUNTER — Encounter (HOSPITAL_COMMUNITY)
Admission: RE | Disposition: A | Discharge status: Skilled Nursing Facility | Payer: Self-pay | Source: Home / Self Care | Attending: Surgery

## 2022-02-06 ENCOUNTER — Encounter (HOSPITAL_COMMUNITY): Payer: Self-pay | Admitting: Surgery

## 2022-02-06 ENCOUNTER — Inpatient Hospital Stay (HOSPITAL_COMMUNITY)
Admission: RE | Admit: 2022-02-06 | Discharge: 2022-02-14 | DRG: 239 | Disposition: A | Discharge status: Skilled Nursing Facility | Payer: Medicaid Other | Attending: Surgery | Admitting: Surgery

## 2022-02-06 ENCOUNTER — Other Ambulatory Visit: Payer: Self-pay

## 2022-02-06 DIAGNOSIS — Z8249 Family history of ischemic heart disease and other diseases of the circulatory system: Secondary | ICD-10-CM

## 2022-02-06 DIAGNOSIS — E860 Dehydration: Secondary | ICD-10-CM | POA: Diagnosis present

## 2022-02-06 DIAGNOSIS — B370 Candidal stomatitis: Secondary | ICD-10-CM | POA: Diagnosis not present

## 2022-02-06 DIAGNOSIS — Z515 Encounter for palliative care: Secondary | ICD-10-CM

## 2022-02-06 DIAGNOSIS — G9341 Metabolic encephalopathy: Secondary | ICD-10-CM | POA: Diagnosis not present

## 2022-02-06 DIAGNOSIS — Z681 Body mass index (BMI) 19 or less, adult: Secondary | ICD-10-CM

## 2022-02-06 DIAGNOSIS — I255 Ischemic cardiomyopathy: Secondary | ICD-10-CM | POA: Diagnosis present

## 2022-02-06 DIAGNOSIS — E871 Hypo-osmolality and hyponatremia: Secondary | ICD-10-CM

## 2022-02-06 DIAGNOSIS — Z7189 Other specified counseling: Secondary | ICD-10-CM | POA: Diagnosis not present

## 2022-02-06 DIAGNOSIS — Z91041 Radiographic dye allergy status: Secondary | ICD-10-CM

## 2022-02-06 DIAGNOSIS — R636 Underweight: Secondary | ICD-10-CM | POA: Diagnosis present

## 2022-02-06 DIAGNOSIS — I70239 Atherosclerosis of native arteries of right leg with ulceration of unspecified site: Secondary | ICD-10-CM | POA: Diagnosis not present

## 2022-02-06 DIAGNOSIS — Z7982 Long term (current) use of aspirin: Secondary | ICD-10-CM

## 2022-02-06 DIAGNOSIS — L97519 Non-pressure chronic ulcer of other part of right foot with unspecified severity: Secondary | ICD-10-CM | POA: Diagnosis present

## 2022-02-06 DIAGNOSIS — R296 Repeated falls: Secondary | ICD-10-CM | POA: Diagnosis present

## 2022-02-06 DIAGNOSIS — F17210 Nicotine dependence, cigarettes, uncomplicated: Secondary | ICD-10-CM | POA: Diagnosis present

## 2022-02-06 DIAGNOSIS — E222 Syndrome of inappropriate secretion of antidiuretic hormone: Secondary | ICD-10-CM | POA: Diagnosis present

## 2022-02-06 DIAGNOSIS — R52 Pain, unspecified: Secondary | ICD-10-CM

## 2022-02-06 DIAGNOSIS — I10 Essential (primary) hypertension: Secondary | ICD-10-CM | POA: Diagnosis present

## 2022-02-06 DIAGNOSIS — E785 Hyperlipidemia, unspecified: Secondary | ICD-10-CM

## 2022-02-06 DIAGNOSIS — G40909 Epilepsy, unspecified, not intractable, without status epilepticus: Secondary | ICD-10-CM | POA: Diagnosis present

## 2022-02-06 DIAGNOSIS — Z7902 Long term (current) use of antithrombotics/antiplatelets: Secondary | ICD-10-CM

## 2022-02-06 DIAGNOSIS — I739 Peripheral vascular disease, unspecified: Secondary | ICD-10-CM | POA: Diagnosis not present

## 2022-02-06 DIAGNOSIS — Z79899 Other long term (current) drug therapy: Secondary | ICD-10-CM

## 2022-02-06 DIAGNOSIS — K5900 Constipation, unspecified: Secondary | ICD-10-CM | POA: Diagnosis not present

## 2022-02-06 DIAGNOSIS — Z87820 Personal history of traumatic brain injury: Secondary | ICD-10-CM

## 2022-02-06 DIAGNOSIS — I70202 Unspecified atherosclerosis of native arteries of extremities, left leg: Principal | ICD-10-CM | POA: Diagnosis present

## 2022-02-06 DIAGNOSIS — E7800 Pure hypercholesterolemia, unspecified: Secondary | ICD-10-CM | POA: Diagnosis present

## 2022-02-06 DIAGNOSIS — F419 Anxiety disorder, unspecified: Secondary | ICD-10-CM | POA: Diagnosis present

## 2022-02-06 HISTORY — PX: ABDOMINAL AORTOGRAM W/LOWER EXTREMITY: CATH118223

## 2022-02-06 LAB — CBC
HCT: 31.7 % — ABNORMAL LOW (ref 39.0–52.0)
Hemoglobin: 10.7 g/dL — ABNORMAL LOW (ref 13.0–17.0)
MCH: 29.3 pg (ref 26.0–34.0)
MCHC: 33.8 g/dL (ref 30.0–36.0)
MCV: 86.8 fL (ref 80.0–100.0)
Platelets: 426 10*3/uL — ABNORMAL HIGH (ref 150–400)
RBC: 3.65 MIL/uL — ABNORMAL LOW (ref 4.22–5.81)
RDW: 13.3 % (ref 11.5–15.5)
WBC: 13.1 10*3/uL — ABNORMAL HIGH (ref 4.0–10.5)
nRBC: 0 % (ref 0.0–0.2)

## 2022-02-06 LAB — POCT I-STAT, CHEM 8
BUN: 29 mg/dL — ABNORMAL HIGH (ref 8–23)
Calcium, Ion: 1.2 mmol/L (ref 1.15–1.40)
Chloride: 96 mmol/L — ABNORMAL LOW (ref 98–111)
Creatinine, Ser: 1.2 mg/dL (ref 0.61–1.24)
Glucose, Bld: 109 mg/dL — ABNORMAL HIGH (ref 70–99)
HCT: 35 % — ABNORMAL LOW (ref 39.0–52.0)
Hemoglobin: 11.9 g/dL — ABNORMAL LOW (ref 13.0–17.0)
Potassium: 4.5 mmol/L (ref 3.5–5.1)
Sodium: 132 mmol/L — ABNORMAL LOW (ref 135–145)
TCO2: 27 mmol/L (ref 22–32)

## 2022-02-06 LAB — CREATININE, SERUM
Creatinine, Ser: 1.23 mg/dL (ref 0.61–1.24)
GFR, Estimated: >60 mL/min (ref >60)

## 2022-02-06 SURGERY — ABDOMINAL AORTOGRAM W/LOWER EXTREMITY
Anesthesia: LOCAL | Laterality: Bilateral

## 2022-02-06 MED ORDER — DIPHENHYDRAMINE HCL 50 MG/ML IJ SOLN
25.0000 mg | INTRAMUSCULAR | Status: AC
  Administered 2022-02-06: 25 mg via INTRAVENOUS
  Filled 2022-02-06: qty 1

## 2022-02-06 MED ORDER — MIRTAZAPINE 15 MG PO TABS
15.0000 mg | ORAL_TABLET | Freq: Every day | ORAL | Status: DC
  Administered 2022-02-06 – 2022-02-13 (×8): 15 mg via ORAL
  Filled 2022-02-06 (×9): qty 1

## 2022-02-06 MED ORDER — HEPARIN (PORCINE) IN NACL 1000-0.9 UT/500ML-% IV SOLN
INTRAVENOUS | Status: DC | PRN
  Administered 2022-02-06 (×2): 500 mL

## 2022-02-06 MED ORDER — CHOLECALCIFEROL 10 MCG (400 UNIT) PO TABS
800.0000 [IU] | ORAL_TABLET | Freq: Every day | ORAL | Status: DC
  Administered 2022-02-06 – 2022-02-14 (×9): 800 [IU] via ORAL
  Filled 2022-02-06 (×9): qty 2

## 2022-02-06 MED ORDER — SODIUM CHLORIDE 0.9% FLUSH
3.0000 mL | Freq: Two times a day (BID) | INTRAVENOUS | Status: DC
  Administered 2022-02-07 – 2022-02-14 (×13): 3 mL via INTRAVENOUS

## 2022-02-06 MED ORDER — ATENOLOL 25 MG PO TABS
50.0000 mg | ORAL_TABLET | Freq: Every day | ORAL | Status: DC
  Administered 2022-02-06 – 2022-02-14 (×9): 50 mg via ORAL
  Filled 2022-02-06 (×6): qty 2
  Filled 2022-02-06 (×2): qty 1
  Filled 2022-02-06: qty 2

## 2022-02-06 MED ORDER — MORPHINE SULFATE (PF) 2 MG/ML IV SOLN
2.0000 mg | INTRAVENOUS | Status: DC | PRN
  Administered 2022-02-06 – 2022-02-10 (×8): 2 mg via INTRAVENOUS
  Filled 2022-02-06 (×8): qty 1

## 2022-02-06 MED ORDER — ASPIRIN 81 MG PO TBEC
81.0000 mg | DELAYED_RELEASE_TABLET | Freq: Every day | ORAL | Status: DC
  Administered 2022-02-06: 81 mg via ORAL
  Filled 2022-02-06 (×2): qty 1

## 2022-02-06 MED ORDER — SODIUM CHLORIDE 0.9% FLUSH: 3.0000 mL | INTRAVENOUS | Status: DC | PRN

## 2022-02-06 MED ORDER — ALUM & MAG HYDROXIDE-SIMETH 200-200-20 MG/5ML PO SUSP: 30.0000 mL | Freq: Four times a day (QID) | ORAL | Status: DC | PRN

## 2022-02-06 MED ORDER — ASPIRIN 81 MG PO TBEC
81.0000 mg | DELAYED_RELEASE_TABLET | Freq: Every day | ORAL | Status: DC
  Administered 2022-02-07 – 2022-02-14 (×8): 81 mg via ORAL
  Filled 2022-02-06 (×9): qty 1

## 2022-02-06 MED ORDER — HYDRALAZINE HCL 20 MG/ML IJ SOLN
5.0000 mg | INTRAMUSCULAR | Status: AC | PRN
  Administered 2022-02-06 (×2): 5 mg via INTRAVENOUS

## 2022-02-06 MED ORDER — LABETALOL HCL 5 MG/ML IV SOLN
INTRAVENOUS | Status: AC
  Filled 2022-02-06: qty 4

## 2022-02-06 MED ORDER — LIDOCAINE HCL (PF) 1 % IJ SOLN
INTRAMUSCULAR | Status: DC | PRN
  Administered 2022-02-06: 15 mL

## 2022-02-06 MED ORDER — ACETAMINOPHEN 325 MG PO TABS
650.0000 mg | ORAL_TABLET | ORAL | Status: DC | PRN
  Administered 2022-02-12: 650 mg via ORAL
  Filled 2022-02-06: qty 2

## 2022-02-06 MED ORDER — ONDANSETRON HCL 4 MG/2ML IJ SOLN: 4.0000 mg | Freq: Four times a day (QID) | INTRAMUSCULAR | Status: DC | PRN

## 2022-02-06 MED ORDER — SODIUM CHLORIDE 0.9 % WEIGHT BASED INFUSION: 1.0000 mL/kg/h | INTRAVENOUS | Status: AC

## 2022-02-06 MED ORDER — LIDOCAINE HCL (PF) 1 % IJ SOLN
INTRAMUSCULAR | Status: AC
  Filled 2022-02-06: qty 30

## 2022-02-06 MED ORDER — NUTRITIONAL SUPPLEMENT PO LIQD: 1.0000 | Freq: Three times a day (TID) | ORAL | Status: DC

## 2022-02-06 MED ORDER — HEPARIN SODIUM (PORCINE) 5000 UNIT/ML IJ SOLN
5000.0000 [IU] | Freq: Three times a day (TID) | INTRAMUSCULAR | Status: DC
Start: 2022-02-06 — End: 2022-02-07
  Administered 2022-02-06 – 2022-02-07 (×2): 5000 [IU] via SUBCUTANEOUS
  Filled 2022-02-06 (×3): qty 1

## 2022-02-06 MED ORDER — GUAIFENESIN 100 MG/5ML PO LIQD: 10.0000 mL | Freq: Four times a day (QID) | ORAL | Status: DC | PRN

## 2022-02-06 MED ORDER — PHENYTOIN SODIUM EXTENDED 30 MG PO CAPS
60.0000 mg | ORAL_CAPSULE | Freq: Every day | ORAL | Status: DC
  Administered 2022-02-06 – 2022-02-13 (×8): 60 mg via ORAL
  Filled 2022-02-06 (×9): qty 2

## 2022-02-06 MED ORDER — HEPARIN (PORCINE) IN NACL 1000-0.9 UT/500ML-% IV SOLN
INTRAVENOUS | Status: AC
  Filled 2022-02-06: qty 1000

## 2022-02-06 MED ORDER — BUSPIRONE HCL 5 MG PO TABS
10.0000 mg | ORAL_TABLET | Freq: Three times a day (TID) | ORAL | Status: DC
  Administered 2022-02-06 – 2022-02-14 (×22): 10 mg via ORAL
  Filled 2022-02-06: qty 1
  Filled 2022-02-06 (×3): qty 2
  Filled 2022-02-06 (×2): qty 1
  Filled 2022-02-06 (×2): qty 2
  Filled 2022-02-06: qty 1
  Filled 2022-02-06 (×13): qty 2
  Filled 2022-02-06: qty 1
  Filled 2022-02-06 (×2): qty 2

## 2022-02-06 MED ORDER — ACETAMINOPHEN 500 MG PO TABS
1000.0000 mg | ORAL_TABLET | Freq: Every day | ORAL | Status: DC
  Administered 2022-02-06 – 2022-02-14 (×9): 1000 mg via ORAL
  Filled 2022-02-06 (×9): qty 2

## 2022-02-06 MED ORDER — LOPERAMIDE HCL 2 MG PO CAPS: 2.0000 mg | ORAL_CAPSULE | ORAL | Status: DC | PRN

## 2022-02-06 MED ORDER — METHYLPREDNISOLONE SODIUM SUCC 125 MG IJ SOLR
125.0000 mg | INTRAMUSCULAR | Status: AC
  Administered 2022-02-06: 125 mg via INTRAVENOUS
  Filled 2022-02-06: qty 2

## 2022-02-06 MED ORDER — MORPHINE SULFATE (PF) 2 MG/ML IV SOLN
INTRAVENOUS | Status: AC
  Filled 2022-02-06: qty 1

## 2022-02-06 MED ORDER — SODIUM CHLORIDE 0.9 % IV SOLN: INTRAVENOUS | Status: DC

## 2022-02-06 MED ORDER — SODIUM CHLORIDE 0.9 % IV SOLN: 250.0000 mL | INTRAVENOUS | Status: DC | PRN

## 2022-02-06 MED ORDER — HYDRALAZINE HCL 20 MG/ML IJ SOLN
INTRAMUSCULAR | Status: AC
  Filled 2022-02-06: qty 1

## 2022-02-06 MED ORDER — PRAVASTATIN SODIUM 40 MG PO TABS
40.0000 mg | ORAL_TABLET | Freq: Every day | ORAL | Status: DC
  Administered 2022-02-06: 40 mg via ORAL
  Filled 2022-02-06: qty 1

## 2022-02-06 MED ORDER — IODIXANOL 320 MG/ML IV SOLN
INTRAVENOUS | Status: DC | PRN
  Administered 2022-02-06: 110 mL

## 2022-02-06 MED ORDER — OXYCODONE HCL 5 MG PO TABS
10.0000 mg | ORAL_TABLET | ORAL | Status: DC | PRN
  Administered 2022-02-06 – 2022-02-10 (×14): 10 mg via ORAL
  Filled 2022-02-06 (×13): qty 2

## 2022-02-06 MED ORDER — LABETALOL HCL 5 MG/ML IV SOLN
10.0000 mg | INTRAVENOUS | Status: AC | PRN
  Administered 2022-02-06 – 2022-02-07 (×4): 10 mg via INTRAVENOUS
  Filled 2022-02-06: qty 4

## 2022-02-06 MED ORDER — OXYCODONE HCL 5 MG PO TABS
ORAL_TABLET | ORAL | Status: AC
  Filled 2022-02-06: qty 2

## 2022-02-06 MED ORDER — PHENYTOIN SODIUM EXTENDED 100 MG PO CAPS
200.0000 mg | ORAL_CAPSULE | Freq: Every day | ORAL | Status: DC
  Administered 2022-02-06 – 2022-02-13 (×8): 200 mg via ORAL
  Filled 2022-02-06 (×9): qty 2

## 2022-02-06 MED ORDER — CERTAVITE/ANTIOXIDANTS PO TABS: 1.0000 | ORAL_TABLET | Freq: Every morning | ORAL | Status: DC

## 2022-02-06 SURGICAL SUPPLY — 10 items
CATH OMNI FLUSH 5F 65CM (CATHETERS) ×1 IMPLANT
DEVICE VASC CLSR CELT ART 5 (Vascular Products) ×1 IMPLANT
KIT MICROPUNCTURE NIT STIFF (SHEATH) ×1 IMPLANT
KIT PV (KITS) ×2 IMPLANT
SHEATH PINNACLE 5F 10CM (SHEATH) ×1 IMPLANT
SHEATH PROBE COVER 6X72 (BAG) ×1 IMPLANT
SYR MEDRAD MARK V 150ML (SYRINGE) ×1 IMPLANT
TRANSDUCER W/STOPCOCK (MISCELLANEOUS) ×2 IMPLANT
TRAY PV CATH (CUSTOM PROCEDURE TRAY) ×2 IMPLANT
WIRE BENTSON .035X145CM (WIRE) ×1 IMPLANT

## 2022-02-06 NOTE — Op Note (Signed)
    Patient name: [name] MRN: [id number] DOB: [birth year] Sex: male  02/06/2022 Pre-operative Diagnosis: Right leg ulcer Post-operative diagnosis:  Same Surgeon:  [name] Procedure Performed:  1.  Ultrasound-guided access, left femoral artery  2.  Abdominal aortogram  3.  Bilateral lower extremity runoff  4.  Closure device, Celt   Indications: This is a 66 year old gentleman with history of right leg ulcer, previously having undergone recanalization and stenting of the superficial femoral artery.  He was recently seen in the office and found to have a external iliac stenosis.  Because he has a wound on his foot, I recommended angiography.  Of note, he has had multiple falls and subdural hematoma as of late.  I felt that proceeding with the procedure was necessary to avoid limb loss.  Procedure:  The patient was identified in the holding area and taken to room 8.  The patient was then placed supine on the table and prepped and draped in the usual sterile fashion.  A time out was called.  No sedation was given.  The patient was premedicated for his contrast allergy.  Ultrasound was used to evaluate the left common femoral artery.  It was patent .  A digital ultrasound image was acquired.  A micropuncture needle was used to access the left common femoral artery under ultrasound guidance.  An 018 wire was advanced without resistance and a micropuncture sheath was placed.  The 018 wire was removed and a benson wire was placed.  The micropuncture sheath was exchanged for a 5 french sheath.  An omniflush catheter was advanced over the wire to the level of L-1.  An abdominal angiogram was obtained.  Next, the catheter was pulled out of the aortic bifurcation and bilateral runoff was performed  Findings:   Aortogram: No significant renal artery stenosis.  The infrarenal abdominal aorta is small in caliber but patent without stenosis.  The left common and external iliac artery are patent  without stenosis.  The left hypogastric artery is occluded.  The right common and external iliac arteries are occluded  Right Lower Extremity: The right common femoral profundofemoral and superficial femoral artery are occluded.  There is minimal blood flow to the right leg.  No named tibial vessels are visualized  Left Lower Extremity: The left common femoral and profundofemoral artery are patent.  The superficial femoral artery is occluded with reconstitution in the adductor canal.  The popliteal artery is diffusely diseased but patent.  There is occlusion of all 3 tibial vessels  Intervention: The artery was closed with a Celt  Impression:  #1  Right iliac and femoral occlusion  #2  Left superficial femoral artery occlusion  #3  Patient will need to be considered for open surgical revascularization versus primary amputation following discussions with the family   V. [name], M.D., [facility] [place] Office: [phone] Pager:  [phone]

## 2022-02-06 NOTE — Progress Notes (Signed)
Pt daughter states that she does have questions for the doctor before moving forward with surgery. Message has been left for physician by patient.

## 2022-02-06 NOTE — Progress Notes (Signed)
Paged MD. Pt complaining of 9/10 RLE pain. Pt only oriented to self.   Awaiting orders at this time.

## 2022-02-06 NOTE — H&P (View-Only) (Signed)
Extensive conversation with the patient and sister regarding treatment options.  I appreciate input from Palliative and the Hospitalists.  The patient was much more interactive tonight and does not desire amputation.  I discussed that even with successful revascularization he may not be able to salvage his leg due to the length of ischemia.  We will make definitive decisions regarding revasculariation vs primary amputation in the am.  [name]

## 2022-02-06 NOTE — Interval H&P Note (Signed)
History and Physical Interval Note:  02/06/2022 8:03 AM  [name]  has presented today for surgery, with the diagnosis of pad.  The various methods of treatment have been discussed with the patient and family. After consideration of risks, benefits and other options for treatment, the patient has consented to  Procedure(s): ABDOMINAL AORTOGRAM W/LOWER EXTREMITY (N/A) as a surgical intervention.  The patient's history has been reviewed, patient examined, no change in status, stable for surgery.  I have reviewed the patient's chart and labs.  Questions were answered to the patient's satisfaction.     [name]

## 2022-02-06 NOTE — Progress Notes (Signed)
Extensive conversation with the patient and sister regarding treatment options.  I appreciate input from Palliative and the Hospitalists.  The patient was much more interactive tonight and does not desire amputation.  I discussed that even with successful revascularization he may not be able to salvage his leg due to the length of ischemia.  We will make definitive decisions regarding revasculariation vs primary amputation in the am.  [name]

## 2022-02-06 NOTE — Consult Note (Signed)
Consultation Note Date: 02/06/2022   Patient Name: [name]  DOB: [birth year]  MRN: [id number]  Age / Sex: 66 y.o., male  PCP: Jeanette Caprice, PA-C Referring Physician: Serafina Mitchell, MD  Reason for Consultation: Establishing goals of care and Psychosocial/spiritual support  HPI/Patient Profile: 66 y.o. male   admitted on 02/06/2022 with a long medical history followed up by Dr. Trula Slade:  PAD, ascending thoracic aortic aneurysm which was repaired on 06/11/2014, ischemic cardiomyopathy.   He is medically managed for hypertension, and takes a statin for hypercholesterolemia.    On 02/28/2021 he underwent angiogram with stenting of the right SFA by Dr. Trula Slade for right foot ulcer.    Pt was last seen 07/17/2021 and at that time, he was not having any rest pain or non healing wounds.      Pt resides in SNF for many years. .  He had recently had an acute SDH on the right with maximal thickness of 36m.  There was mild mass effect upon the right hemisphere but no midline shift.     He was seen on 12/15/2021 and his sister had reported he had had 2 falls the month prior.  Patient endorses pain in right foot.  Patient is a smoker and continues to smoke at facility  Today patient was found to have complete occlusion of right iliac and femoral arteries on arteriogram.  Dr. BTrula Sladedetailed options to family to include an attempted bypass, above-the-knee amputation, or shift to full comfort allowing a natural death.  PMT was consulted to help establish goals of care; family face treatment option decisions, advanced directive decisions and anticipatory care needs.      Clinical Assessment and Goals of Care:  This NP MWadie Lessenreviewed medical records, discussed with Dr BTrula Slade assessed the patient and then meet with the patient's sister/ Chris Eaglesto discuss diagnosis, prognosis, GOC, EOL  wishes disposition and options.  Patient does not have medical decision capacity.  His sister is his main support person and decision maker at this time.   Concept of Palliative Care was introduced as specialized medical care for people and their families living with serious illness.  If focuses on providing relief from the symptoms and stress of a serious illness.  The goal is to improve quality of life for both the patient and the family.   Values and goals of care important to patient and family were attempted to be elicited.  Created space and opportunity for family to explore thoughts and feelings regarding current medical situation.    MSharyn Lullverbalizes an understanding of the seriousness of her brother's current medical situation and the limited viable treatment options offered to him secondary to his multiple comorbidities.  Detailed education offered on 3 presented options by vascular surgeon to include surgical bypass, AKA, comfort measures.  Explored benefits and risks of all 3 options.   The difference between a aggressive medical intervention path  and a palliative comfort care path for this patient at this time was had.  A  discussion was had today regarding advanced directives.  Concepts specific to code status, artifical feeding and hydration, continued IV antibiotics and rehospitalization was had.    Education offered on hospice benefit; philosophy and eligibility   MOST form was introduced and a hard choices booklet was left for review      Questions and concerns addressed.  Patient  encouraged to call with questions or concerns.     PMT will continue to support holistically.          No documented H POA or advanced care planning documents noted.      SUMMARY OF RECOMMENDATIONS    At this time patient's sister is leaning towards AKA.  She wishes to speak again with Dr. Trula Slade and her family members before making a definitive decision  Code Status/Advance  Care Planning: Full code   Symptom Management:  Pain: Morphine 2 mg IV every 1 hour as needed for severe pain  Palliative Prophylaxis:  Bowel Regimen, Delirium Protocol, and Frequent Pain Assessment  Additional Recommendations (Limitations, Scope, Preferences): Full Scope Treatment  Psycho-social/Spiritual:  Desire for further Chaplaincy support:no Additional Recommendations: Education on Hospice  Prognosis:  Unable to determine  Discharge Planning: To Be Determined      Primary Diagnoses: Present on Admission:  PAD (peripheral artery disease) ([place])   I have reviewed the medical record, interviewed the patient and family, and examined the patient. The following aspects are pertinent.  Past Medical History:  Diagnosis Date   AAA (abdominal aortic aneurysm) ([place])    Alcohol abuse    h/o, quit april 5th, 2013   Anxiety    Aortic aneurysm ([place])    Blood transfusion    Cardiomegaly    EF 25-30 % in 2005, EF 50-55% Oct 2013   Depression    Hyperlipidemia    Hypertension    Lumbar herniated disc    Memory deficits    Radiculopathy    Seizures ([place])    last seizure several years ago- per sister 62 or below at [facility] 07-12-[facility]   TBI (traumatic brain injury) [facility])    Sept 1988   Urinary urgency    Social History   Socioeconomic History   Marital status: Single    Spouse name: Not on file   Number of children: Not on file   Years of education: Not on file   Highest education level: Not on file  Occupational History   Not on file  Tobacco Use   Smoking status: Some Days    Packs/day: 0.10    Types: Cigarettes   Smokeless tobacco: Never   Tobacco comments:    "as much as he can"- per [facility] at [facility] 07/01/19  Vaping Use   Vaping Use: Never used  Substance and Sexual Activity   Alcohol use: No    Alcohol/week: 0.0 standard drinks of alcohol    Comment: per sister, last drink was apr 5th--2013 hx of ETOH   Drug use: No   Sexual activity: Not on file   Other Topics Concern   Not on file  Social History Narrative   Not on file   Social Determinants of Health   Financial Resource Strain: Not on file  Food Insecurity: Not on file  Transportation Needs: Not on file  Physical Activity: Not on file  Stress: Not on file  Social Connections: Not on file   Family History  Problem Relation Age of Onset   Liver disease Mother    Hypertension  Mother    Breast cancer Maternal Aunt    Esophageal cancer Cousin    Esophageal cancer Maternal Uncle    Colon cancer Neg Hx    Stomach cancer Neg Hx    Colon polyps Neg Hx    Rectal cancer Neg Hx    Scheduled Meds:  acetaminophen  500-1,000 mg Oral See admin instructions   aspirin EC  81 mg Oral Daily   atenolol  50 mg Oral Daily   busPIRone  10 mg Oral TID   [START ON 0/34/7425] CertaVite/Antioxidants  1 tablet Oral q AM   cholecalciferol  800 Units Oral Daily   heparin  5,000 Units Subcutaneous Q8H   mirtazapine  15 mg Oral QHS   Nutritional Supplement  1 Bottle Oral TID   phenytoin  200 mg Oral QHS   phenytoin  60 mg Oral QHS   pravastatin  40 mg Oral QHS   sodium chloride flush  3 mL Intravenous Q12H   Continuous Infusions:  sodium chloride 100 mL/hr at 02/06/22 0603   sodium chloride     sodium chloride 1 mL/kg/hr (02/06/22 0915)   PRN Meds:.sodium chloride, acetaminophen, aluminum-magnesium hydroxide-simethicone, guaiFENesin, Heparin (Porcine) in NaCl, iodixanol, labetalol, lidocaine (PF), loperamide, morphine injection, ondansetron (ZOFRAN) IV, oxyCODONE, sodium chloride flush Medications Prior to Admission:  Prior to Admission medications   Medication Sig Start Date End Date Taking? Authorizing Provider  acetaminophen (TYLENOL) 500 MG tablet Take 500-1,000 mg by mouth See admin instructions. Take 2 tablets (1000 mg) by mouth in the morning oral & take 1 tablet (500 mg) by mouth every 6 hours as needed for fever, headache, pain.   Yes [provider]  aspirin EC 81 MG  tablet Take 1 tablet (81 mg total) by mouth daily. 08/17/21  Yes Ulyses Amor, PA-C  atenolol (TENORMIN) 50 MG tablet Take 1 tablet (50 mg total) by mouth daily. 06/24/21  Yes Oswald Hillock, MD  busPIRone (BUSPAR) 10 MG tablet Take 10 mg by mouth 3 (three) times daily.   Yes [provider]  cholecalciferol (GNP VITAMIN D3) 10 MCG (400 UNIT) TABS tablet Take 800 Units by mouth daily.   Yes [provider]  clopidogrel (PLAVIX) 75 MG tablet Take 1 tablet (75 mg total) by mouth daily. 08/17/21  Yes Ulyses Amor, PA-C  mirtazapine (REMERON) 15 MG tablet Take 15 mg by mouth at bedtime.   Yes [provider]  Multiple Vitamins-Minerals (CERTAVITE/ANTIOXIDANTS) TABS Take 1 tablet by mouth in the morning.   Yes [provider]  Nutritional Supplement LIQD Take 1 Bottle by mouth 3 (three) times daily. Mighty Shake   Yes [provider]  phenytoin (DILANTIN) 100 MG ER capsule Take 200 mg by mouth at bedtime. Along with 60 mg dose 06/23/12  Yes Bartholomew Crews, MD  phenytoin (DILANTIN) 30 MG ER capsule Take 60 mg by mouth at bedtime. Along with 200 mg dose   Yes [provider]  pravastatin (PRAVACHOL) 40 MG tablet Take 40 mg by mouth at bedtime.   Yes [provider]  aluminum-magnesium hydroxide-simethicone (MAALOX) 200-200-20 MG/5ML SUSP Take 30 mLs by mouth every 6 (six) hours as needed (heartburn/indigestion).    [provider]  guaiFENesin (ROBITUSSIN) 100 MG/5ML liquid Take 10 mLs by mouth every 6 (six) hours as needed for cough.    [provider]  loperamide (IMODIUM) 2 MG capsule Take 2 mg by mouth as needed for diarrhea or loose stools. Do not exceed '16mg'$   in 24 hours    [provider]  magnesium hydroxide (MILK OF MAGNESIA) 400 MG/5ML suspension Take 30 mLs by mouth at bedtime as needed for mild constipation.    [provider]  mineral oil-hydrophilic petrolatum (AQUAPHOR) ointment Apply 1  application topically daily. Apply topically to arms, legs and feet daily for dry skin    [provider]  neomycin-bacitracin-polymyxin (NEOSPORIN) ointment Apply 1 application  topically See admin instructions. Apply once daily in the morning to right leg & as needed for skin tears or abrasions.    [provider]   Allergies  Allergen Reactions   Isovue [Iopamidol] Hives    Pt broke out in one hive on his chest after contrast on 01/03/15.  Pt will need full premeds in the future per Dr Martinique.     Review of Systems  Unable to perform ROS: Acuity of condition    Physical Exam  Vital Signs: BP (!) 165/70 (BP Location: Right Arm)   Pulse 96   Temp 97.6 F (36.4 C) (Temporal)   Resp 20   Ht '5\' 5"'$  (1.651 m)   Wt 48.1 kg   SpO2 99%   BMI 17.65 kg/m  Pain Scale: 0-10   Pain Score: 0-No pain   SpO2: SpO2: 99 % O2 Device:SpO2: 99 % O2 Flow Rate: .O2 Flow Rate (L/min): 2 L/min  IO: Intake/output summary:  Intake/Output Summary (Last 24 hours) at 02/06/2022 1441 Last data filed at 02/06/2022 0915 Gross per 24 hour  Intake --  Output 100 ml  Net -100 ml    LBM:   Baseline Weight: Weight: 48.1 kg Most recent weight: Weight: 48.1 kg     Palliative Assessment/Data:   30 % at best    Signed by: Wadie Lessen, NP   Please contact Palliative Medicine Team phone at [phone] for questions and concerns.  For individual provider: See Shea Evans
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
## 2022-02-07 ENCOUNTER — Encounter (HOSPITAL_COMMUNITY): Payer: Self-pay | Admitting: Surgery

## 2022-02-07 ENCOUNTER — Encounter (HOSPITAL_COMMUNITY)
Admission: RE | Disposition: A | Discharge status: Skilled Nursing Facility | Payer: Self-pay | Source: Home / Self Care | Attending: Surgery

## 2022-02-07 ENCOUNTER — Other Ambulatory Visit: Payer: Self-pay

## 2022-02-07 ENCOUNTER — Ambulatory Visit (HOSPITAL_COMMUNITY): Payer: Medicaid Other | Admitting: Certified Registered"

## 2022-02-07 ENCOUNTER — Ambulatory Visit (HOSPITAL_BASED_OUTPATIENT_CLINIC_OR_DEPARTMENT_OTHER): Payer: Medicaid Other | Admitting: Certified Registered"

## 2022-02-07 DIAGNOSIS — I998 Other disorder of circulatory system: Secondary | ICD-10-CM

## 2022-02-07 DIAGNOSIS — F418 Other specified anxiety disorders: Secondary | ICD-10-CM

## 2022-02-07 DIAGNOSIS — I739 Peripheral vascular disease, unspecified: Secondary | ICD-10-CM | POA: Diagnosis not present

## 2022-02-07 DIAGNOSIS — I10 Essential (primary) hypertension: Secondary | ICD-10-CM

## 2022-02-07 DIAGNOSIS — F17200 Nicotine dependence, unspecified, uncomplicated: Secondary | ICD-10-CM

## 2022-02-07 HISTORY — PX: AMPUTATION: SHX166

## 2022-02-07 LAB — LIPID PANEL
Cholesterol: 165 mg/dL (ref 0–200)
HDL: 49 mg/dL (ref >40)
LDL Cholesterol: 97 mg/dL (ref 0–99)
Total CHOL/HDL Ratio: 3.4 RATIO
Triglycerides: 97 mg/dL (ref <150)
VLDL: 19 mg/dL (ref 0–40)

## 2022-02-07 SURGERY — AMPUTATION, ABOVE KNEE
Anesthesia: General | Site: Knee | Laterality: Right

## 2022-02-07 MED ORDER — CHLORHEXIDINE GLUCONATE 0.12 % MT SOLN
OROMUCOSAL | Status: AC
  Administered 2022-02-07: 15 mL via OROMUCOSAL
  Filled 2022-02-07: qty 15

## 2022-02-07 MED ORDER — BUPIVACAINE LIPOSOME 1.3 % IJ SUSP
INTRAMUSCULAR | Status: AC
  Filled 2022-02-07: qty 20

## 2022-02-07 MED ORDER — BUPIVACAINE HCL (PF) 0.5 % IJ SOLN
INTRAMUSCULAR | Status: DC | PRN
  Administered 2022-02-07: 30 mL

## 2022-02-07 MED ORDER — ACETAMINOPHEN 10 MG/ML IV SOLN
1000.0000 mg | Freq: Once | INTRAVENOUS | Status: DC | PRN
Start: 2022-02-07 — End: 2022-02-07

## 2022-02-07 MED ORDER — DOCUSATE SODIUM 100 MG PO CAPS
100.0000 mg | ORAL_CAPSULE | Freq: Every day | ORAL | Status: DC
  Administered 2022-02-08 – 2022-02-14 (×7): 100 mg via ORAL
  Filled 2022-02-07 (×7): qty 1

## 2022-02-07 MED ORDER — LACTATED RINGERS IV SOLN: INTRAVENOUS | Status: DC

## 2022-02-07 MED ORDER — PROPOFOL 10 MG/ML IV BOLUS
INTRAVENOUS | Status: DC | PRN
  Administered 2022-02-07: 100 mg via INTRAVENOUS

## 2022-02-07 MED ORDER — OXYCODONE HCL 5 MG PO TABS: 5.0000 mg | ORAL_TABLET | Freq: Once | ORAL | Status: DC | PRN

## 2022-02-07 MED ORDER — HEPARIN SODIUM (PORCINE) 5000 UNIT/ML IJ SOLN
5000.0000 [IU] | Freq: Three times a day (TID) | INTRAMUSCULAR | Status: DC
  Administered 2022-02-08 – 2022-02-14 (×19): 5000 [IU] via SUBCUTANEOUS
  Filled 2022-02-07 (×19): qty 1

## 2022-02-07 MED ORDER — ROCURONIUM BROMIDE 10 MG/ML (PF) SYRINGE: PREFILLED_SYRINGE | INTRAVENOUS | Status: DC | PRN

## 2022-02-07 MED ORDER — BUPIVACAINE HCL (PF) 0.5 % IJ SOLN
INTRAMUSCULAR | Status: AC
  Filled 2022-02-07: qty 30

## 2022-02-07 MED ORDER — HYDRALAZINE HCL 20 MG/ML IJ SOLN
10.0000 mg | INTRAMUSCULAR | Status: DC | PRN
  Administered 2022-02-07 – 2022-02-10 (×6): 10 mg via INTRAVENOUS
  Filled 2022-02-07 (×6): qty 1

## 2022-02-07 MED ORDER — PROMETHAZINE HCL 25 MG/ML IJ SOLN: 6.2500 mg | INTRAMUSCULAR | Status: DC | PRN

## 2022-02-07 MED ORDER — ROSUVASTATIN CALCIUM 20 MG PO TABS
20.0000 mg | ORAL_TABLET | Freq: Every day | ORAL | Status: DC
  Administered 2022-02-07 – 2022-02-13 (×7): 20 mg via ORAL
  Filled 2022-02-07 (×7): qty 1

## 2022-02-07 MED ORDER — PHENOL 1.4 % MT LIQD
1.0000 | OROMUCOSAL | Status: DC | PRN
Start: 2022-02-07 — End: 2022-02-14

## 2022-02-07 MED ORDER — EPHEDRINE SULFATE-NACL 50-0.9 MG/10ML-% IV SOSY
PREFILLED_SYRINGE | INTRAVENOUS | Status: DC | PRN
  Administered 2022-02-07: 5 mg via INTRAVENOUS

## 2022-02-07 MED ORDER — LABETALOL HCL 5 MG/ML IV SOLN
10.0000 mg | INTRAVENOUS | Status: DC | PRN
  Filled 2022-02-07: qty 4

## 2022-02-07 MED ORDER — PROPOFOL 10 MG/ML IV BOLUS
INTRAVENOUS | Status: AC
  Filled 2022-02-07: qty 20

## 2022-02-07 MED ORDER — ORAL CARE MOUTH RINSE: 15.0000 mL | Freq: Once | OROMUCOSAL | Status: AC

## 2022-02-07 MED ORDER — FENTANYL CITRATE (PF) 250 MCG/5ML IJ SOLN
INTRAMUSCULAR | Status: DC | PRN
  Administered 2022-02-07: 50 ug via INTRAVENOUS
  Administered 2022-02-07: 100 ug via INTRAVENOUS

## 2022-02-07 MED ORDER — ROCURONIUM BROMIDE 10 MG/ML (PF) SYRINGE
PREFILLED_SYRINGE | INTRAVENOUS | Status: DC | PRN
  Administered 2022-02-07: 40 mg via INTRAVENOUS

## 2022-02-07 MED ORDER — MIDAZOLAM HCL 2 MG/2ML IJ SOLN
INTRAMUSCULAR | Status: DC | PRN
  Administered 2022-02-07 (×2): 1 mg via INTRAVENOUS

## 2022-02-07 MED ORDER — CHLORHEXIDINE GLUCONATE 0.12 % MT SOLN: 15.0000 mL | Freq: Once | OROMUCOSAL | Status: AC

## 2022-02-07 MED ORDER — LIDOCAINE 2% (20 MG/ML) 5 ML SYRINGE
INTRAMUSCULAR | Status: DC | PRN
  Administered 2022-02-07: 60 mg via INTRAVENOUS

## 2022-02-07 MED ORDER — MIDAZOLAM HCL 2 MG/2ML IJ SOLN
INTRAMUSCULAR | Status: AC
Start: 2022-02-07 — End: ?
  Filled 2022-02-07: qty 2

## 2022-02-07 MED ORDER — BACITRACIN ZINC 500 UNIT/GM EX OINT
TOPICAL_OINTMENT | CUTANEOUS | Status: DC | PRN
  Administered 2022-02-07: 1 via TOPICAL

## 2022-02-07 MED ORDER — BUPIVACAINE LIPOSOME 1.3 % IJ SUSP: INTRAMUSCULAR | Status: DC | PRN

## 2022-02-07 MED ORDER — ONDANSETRON HCL 4 MG/2ML IJ SOLN
INTRAMUSCULAR | Status: DC | PRN
  Administered 2022-02-07: 4 mg via INTRAVENOUS

## 2022-02-07 MED ORDER — FENTANYL CITRATE (PF) 250 MCG/5ML IJ SOLN
INTRAMUSCULAR | Status: AC
  Filled 2022-02-07: qty 5

## 2022-02-07 MED ORDER — PHENYLEPHRINE 80 MCG/ML (10ML) SYRINGE FOR IV PUSH (FOR BLOOD PRESSURE SUPPORT)
PREFILLED_SYRINGE | INTRAVENOUS | Status: DC | PRN
  Administered 2022-02-07: 160 ug via INTRAVENOUS
  Administered 2022-02-07: 80 ug via INTRAVENOUS

## 2022-02-07 MED ORDER — SUGAMMADEX SODIUM 200 MG/2ML IV SOLN
INTRAVENOUS | Status: DC | PRN
  Administered 2022-02-07: 200 mg via INTRAVENOUS

## 2022-02-07 MED ORDER — BACITRACIN ZINC 500 UNIT/GM EX OINT
TOPICAL_OINTMENT | CUTANEOUS | Status: AC
  Filled 2022-02-07: qty 28.35

## 2022-02-07 MED ORDER — OXYCODONE HCL 5 MG/5ML PO SOLN: 5.0000 mg | Freq: Once | ORAL | Status: DC | PRN

## 2022-02-07 MED ORDER — FENTANYL CITRATE (PF) 100 MCG/2ML IJ SOLN: 25.0000 ug | INTRAMUSCULAR | Status: DC | PRN

## 2022-02-07 MED ORDER — PHENYLEPHRINE HCL-NACL 20-0.9 MG/250ML-% IV SOLN
INTRAVENOUS | Status: DC | PRN
  Administered 2022-02-07: 25 ug/min via INTRAVENOUS

## 2022-02-07 MED ORDER — AMISULPRIDE (ANTIEMETIC) 5 MG/2ML IV SOLN: 10.0000 mg | Freq: Once | INTRAVENOUS | Status: DC | PRN

## 2022-02-07 MED ORDER — METOPROLOL TARTRATE 5 MG/5ML IV SOLN: 2.0000 mg | INTRAVENOUS | Status: DC | PRN

## 2022-02-07 MED ORDER — PANTOPRAZOLE SODIUM 40 MG PO TBEC
40.0000 mg | DELAYED_RELEASE_TABLET | Freq: Every day | ORAL | Status: DC
  Administered 2022-02-07 – 2022-02-14 (×8): 40 mg via ORAL
  Filled 2022-02-07 (×8): qty 1

## 2022-02-07 MED ORDER — CEFAZOLIN SODIUM-DEXTROSE 2-3 GM-%(50ML) IV SOLR
INTRAVENOUS | Status: DC | PRN
  Administered 2022-02-07: 2 g via INTRAVENOUS

## 2022-02-07 MED ORDER — CEFAZOLIN SODIUM-DEXTROSE 2-4 GM/100ML-% IV SOLN
INTRAVENOUS | Status: AC
  Filled 2022-02-07: qty 100

## 2022-02-07 MED ORDER — OXYCODONE-ACETAMINOPHEN 5-325 MG PO TABS
1.0000 | ORAL_TABLET | Freq: Four times a day (QID) | ORAL | Status: DC | PRN
  Administered 2022-02-11: 2 via ORAL
  Administered 2022-02-11: 1 via ORAL
  Administered 2022-02-11 (×2): 2 via ORAL
  Administered 2022-02-12: 1 via ORAL
  Filled 2022-02-07 (×2): qty 2
  Filled 2022-02-07: qty 1
  Filled 2022-02-07: qty 2
  Filled 2022-02-07: qty 1

## 2022-02-07 SURGICAL SUPPLY — 55 items
BAG COUNTER SPONGE SURGICOUNT (BAG) ×2 IMPLANT
BLADE SAW GIGLI 510 (BLADE) ×2 IMPLANT
BNDG COHESIVE 6X5 TAN STRL LF (GAUZE/BANDAGES/DRESSINGS) ×2 IMPLANT
BNDG ELASTIC 4X5.8 VLCR STR LF (GAUZE/BANDAGES/DRESSINGS) ×2 IMPLANT
BNDG ELASTIC 6X5.8 VLCR STR LF (GAUZE/BANDAGES/DRESSINGS) ×2 IMPLANT
BNDG GAUZE DERMACEA FLUFF (GAUZE/BANDAGES/DRESSINGS) ×2
BNDG GAUZE DERMACEA FLUFF 4 (GAUZE/BANDAGES/DRESSINGS) IMPLANT
BNDG GAUZE ELAST 4 BULKY (GAUZE/BANDAGES/DRESSINGS) ×4 IMPLANT
CANISTER SUCT 3000ML PPV (MISCELLANEOUS) ×2 IMPLANT
CLIP VESOCCLUDE MED 6/CT (CLIP) ×2 IMPLANT
CNTNR URN SCR LID CUP LEK RST (MISCELLANEOUS) ×1 IMPLANT
CONT SPEC 4OZ STRL OR WHT (MISCELLANEOUS) ×2
COVER SURGICAL LIGHT HANDLE (MISCELLANEOUS) ×2 IMPLANT
DRAIN CHANNEL 19F RND (DRAIN) IMPLANT
DRAPE DERMATAC (DRAPES) IMPLANT
DRAPE HALF SHEET 40X57 (DRAPES) ×2 IMPLANT
DRAPE INCISE IOBAN 66X45 STRL (DRAPES) IMPLANT
DRAPE ORTHO SPLIT 77X108 STRL (DRAPES) ×4
DRAPE SURG ORHT 6 SPLT 77X108 (DRAPES) ×2 IMPLANT
DRESSING PREVENA PLUS CUSTOM (GAUZE/BANDAGES/DRESSINGS) IMPLANT
DRSG ADAPTIC 3X8 NADH LF (GAUZE/BANDAGES/DRESSINGS) ×2 IMPLANT
DRSG PREVENA PLUS CUSTOM (GAUZE/BANDAGES/DRESSINGS)
ELECT CAUTERY BLADE 6.4 (BLADE) ×2 IMPLANT
ELECT REM PT RETURN 9FT ADLT (ELECTROSURGICAL) ×2
ELECTRODE REM PT RTRN 9FT ADLT (ELECTROSURGICAL) ×1 IMPLANT
EVACUATOR SILICONE 100CC (DRAIN) IMPLANT
GAUZE 4X4 16PLY ~~LOC~~+RFID DBL (SPONGE) ×2 IMPLANT
GAUZE SPONGE 4X4 12PLY STRL (GAUZE/BANDAGES/DRESSINGS) ×3 IMPLANT
GLOVE SURG SS PI 7.5 STRL IVOR (GLOVE) ×6 IMPLANT
GOWN STRL REUS W/ TWL LRG LVL3 (GOWN DISPOSABLE) ×2 IMPLANT
GOWN STRL REUS W/ TWL XL LVL3 (GOWN DISPOSABLE) ×1 IMPLANT
GOWN STRL REUS W/TWL LRG LVL3 (GOWN DISPOSABLE) ×4
GOWN STRL REUS W/TWL XL LVL3 (GOWN DISPOSABLE) ×2
KIT BASIN OR (CUSTOM PROCEDURE TRAY) ×2 IMPLANT
KIT TURNOVER KIT B (KITS) ×2 IMPLANT
NDL HYPO 25GX1X1/2 BEV (NEEDLE) ×1 IMPLANT
NEEDLE HYPO 25GX1X1/2 BEV (NEEDLE) ×2 IMPLANT
NS IRRIG 1000ML POUR BTL (IV SOLUTION) ×2 IMPLANT
PACK GENERAL/GYN (CUSTOM PROCEDURE TRAY) ×2 IMPLANT
PAD ARMBOARD 7.5X6 YLW CONV (MISCELLANEOUS) ×4 IMPLANT
PREVENA RESTOR ARTHOFORM 46X30 (CANNISTER) IMPLANT
STAPLER VISISTAT 35W (STAPLE) ×2 IMPLANT
STOCKINETTE IMPERVIOUS LG (DRAPES) ×2 IMPLANT
SUT ETHILON 3 0 PS 1 (SUTURE) IMPLANT
SUT SILK 0 TIES 10X30 (SUTURE) ×2 IMPLANT
SUT SILK 2 0 (SUTURE)
SUT SILK 2-0 18XBRD TIE 12 (SUTURE) IMPLANT
SUT SILK 3 0 (SUTURE)
SUT SILK 3-0 18XBRD TIE 12 (SUTURE) IMPLANT
SUT VIC AB 2-0 CT1 18 (SUTURE) ×4 IMPLANT
SYR 30ML LL (SYRINGE) ×1 IMPLANT
SYR CONTROL 10ML LL (SYRINGE) ×2 IMPLANT
TOWEL GREEN STERILE (TOWEL DISPOSABLE) ×2 IMPLANT
UNDERPAD 30X36 HEAVY ABSORB (UNDERPADS AND DIAPERS) ×2 IMPLANT
WATER STERILE IRR 1000ML POUR (IV SOLUTION) ×2 IMPLANT
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
## 2022-02-07 NOTE — Interval H&P Note (Signed)
History and Physical Interval Note:  02/07/2022 3:26 PM  [name]  has presented today for surgery, with the diagnosis of Right leg ulcer.  The various methods of treatment have been discussed with the patient and family. After consideration of risks, benefits and other options for treatment, the patient has consented to  Procedure(s): RIGHT ABOVE KNEE AMPUTATION (Right) as a surgical intervention.  The patient's history has been reviewed, patient examined, no change in status, stable for surgery.  I have reviewed the patient's chart and labs.  Questions were answered to the patient's satisfaction.     [name]

## 2022-02-07 NOTE — Anesthesia Preprocedure Evaluation (Addendum)
Anesthesia Evaluation  Patient identified by MRN, date of birth, ID band Patient awake    Reviewed: Allergy & Precautions, NPO status , Patient's Chart, lab work & pertinent test results  Airway Mallampati: II  TM Distance: >3 FB Neck ROM: Full    Dental  (+) Edentulous Upper, Edentulous Lower   Pulmonary Current Smoker and Patient abstained from smoking.,    Pulmonary exam normal        Cardiovascular hypertension, Pt. on home beta blockers + Peripheral Vascular Disease  Normal cardiovascular exam     Neuro/Psych Seizures -,  PSYCHIATRIC DISORDERS Anxiety Depression Dementia TBI (traumatic brain injury)  Neuromuscular disease    GI/Hepatic negative GI ROS, Neg liver ROS,   Endo/Other  negative endocrine ROS  Renal/GU negative Renal ROS     Musculoskeletal   Abdominal   Peds  Hematology  (+) Blood dyscrasia, anemia ,   Anesthesia Other Findings Right leg ulcer  Reproductive/Obstetrics                           Anesthesia Physical Anesthesia Plan  ASA: 3  Anesthesia Plan: General   Post-op Pain Management:    Induction: Intravenous  PONV Risk Score and Plan: 1 and Ondansetron, Dexamethasone, Midazolam and Treatment may vary due to age or medical condition  Airway Management Planned: Oral ETT  Additional Equipment:   Intra-op Plan:   Post-operative Plan: Extubation in OR  Informed Consent: I have reviewed the patients History and Physical, chart, labs and discussed the procedure including the risks, benefits and alternatives for the proposed anesthesia with the patient or authorized representative who has indicated his/her understanding and acceptance.     Dental advisory given  Plan Discussed with: CRNA  Anesthesia Plan Comments:        Anesthesia Quick Evaluation

## 2022-02-07 NOTE — TOC Initial Note (Signed)
Transition of Care [facility]) - Initial/Assessment Note    Patient Details  Name: [name] MRN: [id number] Date of Birth: 04/25/1956  Transition of Care [facility]) CM/SW Contact:    Milas Gain, [place] Phone Number: 02/07/2022, 5:49 PM  Clinical Narrative:                   Due to patients current orientation CSW spoke with patients sister [name]. [name] reports patient comes from [place]. [name] confirmed plan is for patient to return to ALF when medically ready for dc. CSW will continue to follow and assist with patients dc planning needs.  Expected Discharge Plan: Assisted Living Barriers to Discharge: Continued Medical Work up   Patient Goals and CMS Choice   CMS Medicare.gov Compare Post Acute Care list provided to:: Patient Represenative (must comment) (Patients sister) Choice offered to / list presented to : Sibling  Expected Discharge Plan and Services Expected Discharge Plan: Assisted Living In-house Referral: Clinical Social Work     Living arrangements for the past 2 months: [place]                                      Prior Living Arrangements/Services Living arrangements for the past 2 months: [place] Lives with:: Facility Resident (From ALF) Patient language and need for interpreter reviewed:: Yes Do you feel safe going back to the place where you live?: Yes      Need for Family Participation in Patient Care: Yes (Comment) Care giver support system in place?: Yes (comment)   Criminal Activity/Legal Involvement Pertinent to Current Situation/Hospitalization: No - Comment as needed  Activities of Daily Living      Permission Sought/Granted Permission sought to share information with : Case Manager, Family Supports, Customer service manager Permission granted to share information with : No  Share Information with NAME: Due to patients current orientation CSW spoke with patients  sister  Permission granted to share info w AGENCY: ALF  Permission granted to share info w Relationship: sister  Permission granted to share info w Contact Information: [name] [phone]  Emotional Assessment       Orientation: : Oriented to Self Alcohol / Substance Use: Not Applicable Psych Involvement: No (comment)  Admission diagnosis:  PAD (peripheral artery disease) ([place]) [I73.9] Patient Active Problem List   Diagnosis Date Noted   PAD (peripheral artery disease) ([place]) 02/06/2022   Protein-calorie malnutrition, severe 06/15/2021   Subdural hematoma ([place]) 06/14/2021   Dementia without behavioral disturbance ([place]) 04/25/2021   Lung nodule    AAA (abdominal aortic aneurysm) without rupture ([place]) 10/21/2012   Thoracic aneurysm without mention of rupture 10/21/2012   Hyperlipidemia 10/18/2012   Poor short term memory 10/18/2012   Depression 10/16/2012   Dry skin 10/16/2012   Urge urinary incontinence 08/26/2012   Lumbosacral radiculopathy at L4 07/30/2012   Hyperextension of knee or lower leg 05/08/2012   Shoulder pain, right 02/16/2012   Healthcare maintenance 02/16/2012   Cardiomyopathy, nonischemic ([place]) 02/14/2012   Short-term memory loss 02/14/2012   Hyponatremia 10/18/2011   Seizures ([place]) 10/18/2011   Hypertension 10/18/2011   TBI (traumatic brain injury) ([place]) 10/18/2011   H/O alcohol abuse 10/18/2011   Tobacco abuse 10/18/2011   PCP:  Jeanette Caprice, PA-C Pharmacy:   [place], [place] [address] [place] [address] Phone: [phone]  Fax: [phone]  [facility] at [facility], [place] [address] Phone: [phone] Fax: [phone]  [place], [place] [address] [place] [address] Phone: [phone] Fax: [place] [address] [address] Phone: [phone] Fax: [phone]     Social Determinants of Health (SDOH) Interventions    Readmission Risk Interventions     No data to display

## 2022-02-07 NOTE — Plan of Care (Signed)
  Problem: Cardiovascular: Goal: Ability to achieve and maintain adequate cardiovascular perfusion will improve Outcome: Progressing Goal: Vascular access site(s) Level 0-1 will be maintained Outcome: Progressing   Problem: Clinical Measurements: Goal: Respiratory complications will improve Outcome: Progressing   Problem: Clinical Measurements: Goal: Cardiovascular complication will be avoided Outcome: Progressing   Problem: Clinical Measurements: Goal: Will remain free from infection Outcome: Progressing   Problem: Activity: Goal: Risk for activity intolerance will decrease Outcome: Progressing   Problem: Nutrition: Goal: Adequate nutrition will be maintained Outcome: Progressing   Problem: Elimination: Goal: Will not experience complications related to bowel motility Outcome: Progressing   Problem: Elimination: Goal: Will not experience complications related to urinary retention Outcome: Progressing   Problem: Pain Managment: Goal: General experience of comfort will improve Outcome: Progressing   Problem: Skin Integrity: Goal: Risk for impaired skin integrity will decrease Outcome: Progressing

## 2022-02-07 NOTE — Anesthesia Postprocedure Evaluation (Signed)
Anesthesia Post Note  Patient: [name]  Procedure(s) Performed: RIGHT ABOVE KNEE AMPUTATION (Right: Knee)     Patient location during evaluation: PACU Anesthesia Type: General Level of consciousness: awake Pain management: pain level controlled Vital Signs Assessment: post-procedure vital signs reviewed and stable Respiratory status: spontaneous breathing, nonlabored ventilation, respiratory function stable and patient connected to nasal cannula oxygen Cardiovascular status: blood pressure returned to baseline and stable Postop Assessment: no apparent nausea or vomiting Anesthetic complications: no   No notable events documented.  Last Vitals:  Vitals:   02/07/22 1800 02/07/22 1832  BP: (!) 136/59 (!) 141/76  Pulse: 62 73  Resp: 11 14  Temp:  36.6 C  SpO2: 98% 98%    Last Pain:  Vitals:   02/07/22 1832  TempSrc: Axillary  PainSc:                  Karyl Kinnier Bravlio Luca

## 2022-02-07 NOTE — Transfer of Care (Signed)
Immediate Anesthesia Transfer of Care Note  Patient: [name]  Procedure(s) Performed: RIGHT ABOVE KNEE AMPUTATION (Right: Knee)  Patient Location: PACU  Anesthesia Type:General  Level of Consciousness: drowsy  Airway & Oxygen Therapy: Patient Spontanous Breathing and Patient connected to face mask oxygen  Post-op Assessment: Report given to RN and Post -op Vital signs reviewed and stable  Post vital signs: Reviewed and stable  Last Vitals:  Vitals Value Taken Time  BP 124/54 02/07/22 1730  Temp 36.4 C 02/07/22 1730  Pulse 60 02/07/22 1737  Resp 10 02/07/22 1737  SpO2 100 % 02/07/22 1737  Vitals shown include unvalidated device data.  Last Pain:  Vitals:   02/07/22 1730  TempSrc:   PainSc: Asleep      Patients Stated Pain Goal: 3 (16/42/90 3795)  Complications: No notable events documented.

## 2022-02-07 NOTE — Op Note (Signed)
    Patient name: [name] MRN: [id number] DOB: Nov 09, 1955 Sex: male  02/07/2022 Pre-operative Diagnosis: Ischemic right leg Post-operative diagnosis:  Same Surgeon:  [name] Assistants:  [name], PA Procedure:   Right above knee amputation Anesthesia:  general Blood Loss:  minimal Specimens:  right leg  Findings:  healthy muscle and tissue at the amputation site  Indications: This is a 66 year old gentleman who has previously undergone stenting of his right superficial femoral artery for an ulcer.  He was seen 3 weeks ago and found to have a stenosis within his external iliac artery and was scheduled for angiography yesterday.  He does report a several day history of severe right leg pain.  He has abrasions and infection on the right foot.  He underwent angiography yesterday and was found to have occlusion of the vasculature in the right leg beginning at the common femoral artery.  Because of his underlying comorbidities as well as the appearance of his foot (he does not have motor or sensory function), I felt that amputation was his best option for survival.  We did discuss the possibility of revascularization, however I did not think this would give him a good outcome because of the duration of ischemia.  Patient and family are all in agreement to proceed with amputation.  Procedure:  The patient was identified in the holding area and taken to Organ 12  The patient was then placed supine on the table. general anesthesia was administered.  The patient was prepped and draped in the usual sterile fashion.  A time out was called and antibiotics were administered.  A PA was necessary to expedite the procedure and assist with technical details.  She provided retraction to facilitate exposure as well as suctioning.  She also assisted with wound closure.  A fishmouth incision was made above the knee.  Cautery was used divide subcutaneous tissue down to the fascia which was  opened with cautery.  I then circumferentially exposed the femur.  A periosteal elevator was used to elevate the periosteum.  I then proceeded with cautery to divide the remaining muscle, leaving the neurovascular bundle intact.  I divided the artery and vein between hemostats as well as the nerve.  A Gigli saw was then used to transect the femur, beveling the anterior surface.  The muscle and tissue were all viable at the level of the amputation.  I then ligated the artery and vein proximal to the cut edge of the femur with a 0 silk tie.  Exparel was then infiltrated into the nerve which was ligated with a 0 silk tie proximal to the cut edge of the femur.  I then placed Exparel throughout the subcutaneous tissue.  The wound was then copiously irrigated.  Hemostasis was achieved.  The fascia was then reapproximated with interrupted 2-0 Vicryl and skin was closed with staples.  Sterile dressings were applied.  There were no immediate complications.   Disposition: To PACU stable   V. [name], M.D., [facility] [place] Office: [phone] Pager:  [phone]

## 2022-02-07 NOTE — Anesthesia Procedure Notes (Signed)
Procedure Name: Intubation Date/Time: 02/07/2022 4:29 PM  Performed by: Griffin Dakin, CRNAPre-anesthesia Checklist: Patient identified, Emergency Drugs available, Suction available and Patient being monitored Patient Re-evaluated:Patient Re-evaluated prior to induction Oxygen Delivery Method: Circle system utilized Preoxygenation: Pre-oxygenation with 100% oxygen Induction Type: IV induction Ventilation: Mask ventilation without difficulty Laryngoscope Size: Miller and 2 Grade View: Grade I Tube type: Oral Tube size: 7.5 mm Number of attempts: 1 Airway Equipment and Method: Stylet and Oral airway Placement Confirmation: ETT inserted through vocal cords under direct vision, positive ETCO2 and breath sounds checked- equal and bilateral Secured at: 24 cm Tube secured with: Tape Dental Injury: Teeth and Oropharynx as per pre-operative assessment

## 2022-02-07 NOTE — Progress Notes (Signed)
PHARMACIST LIPID MONITORING   [name] is a 66 y.o. male admitted on 02/06/2022 with PVD.  Pharmacy has been consulted to optimize lipid-lowering therapy with the indication of secondary prevention for clinical ASCVD.  Recent Labs:  Lipid Panel (last 6 months):   Lab Results  Component Value Date   CHOL 165 02/07/2022   TRIG 97 02/07/2022   HDL 49 02/07/2022   CHOLHDL 3.4 02/07/2022   VLDL 19 02/07/2022   LDLCALC 97 02/07/2022    Hepatic function panel (last 6 months):   Lab Results  Component Value Date   AST 31 11/09/2021   ALT 18 11/09/2021   ALKPHOS 72 11/09/2021   BILITOT 0.5 11/09/2021    SCr (since admission):   Serum creatinine: 1.23 mg/dL 02/06/22 1631 Estimated creatinine clearance: 41.3 mL/min  Current therapy and lipid therapy tolerance Current lipid-lowering therapy: pravastatin Documented or reported allergies or intolerances to lipid-lowering therapies (if applicable): none  Assessment:   Patient agrees with changes to lipid-lowering therapy  Plan:    1.Statin intensity (high intensity recommended for all patients regardless of the LDL):  Add or increase statin to high intensity. -Crestor '20mg'$  po daily and increase to '40mg'$  daily if tolerated  2.Add ezetimibe (if any one of the following):   Not indicated at this time.  3.Refer to lipid clinic:   No  4.Follow-up with:  Primary care provider - Kurth-Bowen, Cornelia, PA-C  5.Follow-up labs after discharge:  Changes in lipid therapy were made. Check a lipid panel in 8-12 weeks then annually.     Hildred Laser, PharmD Clinical Pharmacist **Pharmacist phone directory can now be found on [email or web address] ([email or web address]).  Listed under [place].

## 2022-02-08 ENCOUNTER — Encounter (HOSPITAL_COMMUNITY): Payer: Self-pay | Admitting: Surgery

## 2022-02-08 DIAGNOSIS — G9341 Metabolic encephalopathy: Secondary | ICD-10-CM | POA: Diagnosis not present

## 2022-02-08 DIAGNOSIS — Z7902 Long term (current) use of antithrombotics/antiplatelets: Secondary | ICD-10-CM | POA: Diagnosis not present

## 2022-02-08 DIAGNOSIS — G8918 Other acute postprocedural pain: Secondary | ICD-10-CM

## 2022-02-08 DIAGNOSIS — F419 Anxiety disorder, unspecified: Secondary | ICD-10-CM | POA: Diagnosis present

## 2022-02-08 DIAGNOSIS — Z681 Body mass index (BMI) 19 or less, adult: Secondary | ICD-10-CM | POA: Diagnosis not present

## 2022-02-08 DIAGNOSIS — E222 Syndrome of inappropriate secretion of antidiuretic hormone: Secondary | ICD-10-CM | POA: Diagnosis present

## 2022-02-08 DIAGNOSIS — B370 Candidal stomatitis: Secondary | ICD-10-CM | POA: Diagnosis not present

## 2022-02-08 DIAGNOSIS — I255 Ischemic cardiomyopathy: Secondary | ICD-10-CM | POA: Diagnosis present

## 2022-02-08 DIAGNOSIS — E7800 Pure hypercholesterolemia, unspecified: Secondary | ICD-10-CM | POA: Diagnosis present

## 2022-02-08 DIAGNOSIS — Z515 Encounter for palliative care: Secondary | ICD-10-CM | POA: Diagnosis not present

## 2022-02-08 DIAGNOSIS — I10 Essential (primary) hypertension: Secondary | ICD-10-CM | POA: Diagnosis present

## 2022-02-08 DIAGNOSIS — Z7982 Long term (current) use of aspirin: Secondary | ICD-10-CM | POA: Diagnosis not present

## 2022-02-08 DIAGNOSIS — Z79899 Other long term (current) drug therapy: Secondary | ICD-10-CM | POA: Diagnosis not present

## 2022-02-08 DIAGNOSIS — R636 Underweight: Secondary | ICD-10-CM | POA: Diagnosis present

## 2022-02-08 DIAGNOSIS — Z8249 Family history of ischemic heart disease and other diseases of the circulatory system: Secondary | ICD-10-CM | POA: Diagnosis not present

## 2022-02-08 DIAGNOSIS — F17210 Nicotine dependence, cigarettes, uncomplicated: Secondary | ICD-10-CM | POA: Diagnosis present

## 2022-02-08 DIAGNOSIS — L97519 Non-pressure chronic ulcer of other part of right foot with unspecified severity: Secondary | ICD-10-CM | POA: Diagnosis present

## 2022-02-08 DIAGNOSIS — K5900 Constipation, unspecified: Secondary | ICD-10-CM | POA: Diagnosis not present

## 2022-02-08 DIAGNOSIS — Z91041 Radiographic dye allergy status: Secondary | ICD-10-CM | POA: Diagnosis not present

## 2022-02-08 DIAGNOSIS — E860 Dehydration: Secondary | ICD-10-CM | POA: Diagnosis present

## 2022-02-08 DIAGNOSIS — R296 Repeated falls: Secondary | ICD-10-CM | POA: Diagnosis present

## 2022-02-08 DIAGNOSIS — I739 Peripheral vascular disease, unspecified: Secondary | ICD-10-CM | POA: Diagnosis present

## 2022-02-08 DIAGNOSIS — Z87820 Personal history of traumatic brain injury: Secondary | ICD-10-CM | POA: Diagnosis not present

## 2022-02-08 DIAGNOSIS — I70202 Unspecified atherosclerosis of native arteries of extremities, left leg: Secondary | ICD-10-CM | POA: Diagnosis present

## 2022-02-08 DIAGNOSIS — G40909 Epilepsy, unspecified, not intractable, without status epilepticus: Secondary | ICD-10-CM | POA: Diagnosis present

## 2022-02-08 LAB — BASIC METABOLIC PANEL
Anion gap: 10 (ref 5–15)
BUN: 21 mg/dL (ref 8–23)
CO2: 23 mmol/L (ref 22–32)
Calcium: 9 mg/dL (ref 8.9–10.3)
Chloride: 96 mmol/L — ABNORMAL LOW (ref 98–111)
Creatinine, Ser: 1.17 mg/dL (ref 0.61–1.24)
GFR, Estimated: >60 mL/min (ref >60)
Glucose, Bld: 103 mg/dL — ABNORMAL HIGH (ref 70–99)
Potassium: 4.1 mmol/L (ref 3.5–5.1)
Sodium: 129 mmol/L — ABNORMAL LOW (ref 135–145)

## 2022-02-08 LAB — CBC
HCT: 32.5 % — ABNORMAL LOW (ref 39.0–52.0)
Hemoglobin: 10.9 g/dL — ABNORMAL LOW (ref 13.0–17.0)
MCH: 29.7 pg (ref 26.0–34.0)
MCHC: 33.5 g/dL (ref 30.0–36.0)
MCV: 88.6 fL (ref 80.0–100.0)
Platelets: 450 10*3/uL — ABNORMAL HIGH (ref 150–400)
RBC: 3.67 MIL/uL — ABNORMAL LOW (ref 4.22–5.81)
RDW: 13.5 % (ref 11.5–15.5)
WBC: 11.1 10*3/uL — ABNORMAL HIGH (ref 4.0–10.5)
nRBC: 0 % (ref 0.0–0.2)

## 2022-02-08 MED ORDER — SODIUM CHLORIDE 0.9 % IV SOLN: INTRAVENOUS | Status: DC

## 2022-02-08 NOTE — NC FL2 (Signed)
[place] LEVEL OF CARE SCREENING TOOL     IDENTIFICATION  Patient Name: [name] Birthdate: 1955-07-09 Sex: male Admission Date (Current Location): 02/06/2022  [facility] and [place] Number:  Herbalist and Address:  [facility], [place] [address] [place], [place] [address]      Provider Number: [id number]  Attending Physician Name and Address:  Serafina Mitchell, MD  Relative Name and Phone Number:  [name] [phone]    Current Level of Care: Hospital Recommended Level of Care: [place] Prior Approval Number:    Date Approved/Denied:   PASRR Number: [id number]  Discharge Plan: SNF    Current Diagnoses: Patient Active Problem List   Diagnosis Date Noted   PAD (peripheral artery disease) ([place]) 02/06/2022   Protein-calorie malnutrition, severe 06/15/2021   Subdural hematoma ([place]) 06/14/2021   Dementia without behavioral disturbance ([place]) 04/25/2021   Lung nodule    AAA (abdominal aortic aneurysm) without rupture ([place]) 10/21/2012   Thoracic aneurysm without mention of rupture 10/21/2012   Hyperlipidemia 10/18/2012   Poor short term memory 10/18/2012   Depression 10/16/2012   Dry skin 10/16/2012   Urge urinary incontinence 08/26/2012   Lumbosacral radiculopathy at L4 07/30/2012   Hyperextension of knee or lower leg 05/08/2012   Shoulder pain, right 02/16/2012   Healthcare maintenance 02/16/2012   Cardiomyopathy, nonischemic (HCC) 02/14/2012   Short-term memory loss 02/14/2012   Hyponatremia 10/18/2011   Seizures ([place]) 10/18/2011   Hypertension 10/18/2011   TBI (traumatic brain injury) ([place]) 10/18/2011   H/O alcohol abuse 10/18/2011   Tobacco abuse 10/18/2011    Orientation RESPIRATION BLADDER Height & Weight     Self, Place  Normal Continent, External catheter (External Urinary Catheter) Weight: 106 lb 0.7 oz (48.1 kg) Height:  '5\' 5"'$  (165.1 cm)  BEHAVIORAL SYMPTOMS/MOOD NEUROLOGICAL  BOWEL NUTRITION STATUS      Incontinent Diet (Please see discharge summary)  AMBULATORY STATUS COMMUNICATION OF NEEDS Skin   Extensive Assist Verbally Other (Comment) (Appropriate for ethnicity,Ecchymosis arm,R,upper,non-tenting,abrasion,back,L,cleansed,Incision,closed,Leg,R,compression wrap,clean,dry,intact,dressing in place)                       Personal Care Assistance Level of Assistance  Bathing, Feeding, Dressing Bathing Assistance: Maximum assistance Feeding assistance: Limited assistance (Needs set up) Dressing Assistance: Maximum assistance     Functional Limitations Info  Sight, Hearing, Speech Sight Info: Adequate (WDL) Hearing Info: Adequate (WDL) Speech Info: Adequate (WDL)    SPECIAL CARE FACTORS FREQUENCY  PT (By licensed PT), OT (By licensed OT)     PT Frequency: 5x min weekly OT Frequency: 5x min weekly            Contractures Contractures Info: Not present    Additional Factors Info  Code Status, Allergies, Psychotropic Code Status Info: FULL Allergies Info: Isovue (iopamidol) Psychotropic Info: busPIRone (BUSPAR) tablet 10 mg 3 times daily,mirtazapine (REMERON) tablet 15 mg daily at bedtime,         Current Medications (02/08/2022):  This is the current hospital active medication list Current Facility-Administered Medications  Medication Dose Route Frequency Provider Last Rate Last Admin   0.9 %  sodium chloride infusion  250 mL Intravenous PRN Rhyne, Samantha J, PA-C       0.9 %  sodium chloride infusion   Intravenous Continuous Florencia Reasons, MD       acetaminophen (TYLENOL) tablet 1,000 mg  1,000 mg Oral Daily Rhyne, Samantha J, PA-C   1,000 mg at  02/08/22 0930   acetaminophen (TYLENOL) tablet 650 mg  650 mg Oral Q4H PRN Rhyne, Samantha J, PA-C       alum & mag hydroxide-simeth (MAALOX/MYLANTA) 200-200-20 MG/5ML suspension 30 mL  30 mL Oral Q6H PRN Rhyne, Samantha J, PA-C       aspirin EC tablet 81 mg  81 mg Oral Daily Gabriel Earing, PA-C    81 mg at 02/08/22 0931   atenolol (TENORMIN) tablet 50 mg  50 mg Oral Daily Rhyne, Samantha J, PA-C   50 mg at 02/08/22 0930   busPIRone (BUSPAR) tablet 10 mg  10 mg Oral TID Rhyne, Samantha J, PA-C   10 mg at 02/08/22 0930   cholecalciferol (VITAMIN D3) 10 MCG (400 UNIT) tablet 800 Units  800 Units Oral Daily Gabriel Earing, PA-C   800 Units at 02/08/22 0930   docusate sodium (COLACE) capsule 100 mg  100 mg Oral Daily Rhyne, Samantha J, PA-C   100 mg at 02/08/22 0931   guaiFENesin (ROBITUSSIN) 100 MG/5ML liquid 10 mL  10 mL Oral Q6H PRN Rhyne, Samantha J, PA-C       heparin injection 5,000 Units  5,000 Units Subcutaneous Q8H Rhyne, Hulen Shouts, PA-C   5,000 Units at 02/08/22 1339   hydrALAZINE (APRESOLINE) injection 10 mg  10 mg Intravenous Q2H PRN Rhyne, Hulen Shouts, PA-C   10 mg at 02/07/22 1152   labetalol (NORMODYNE) injection 10 mg  10 mg Intravenous Q10 min PRN Rhyne, Samantha J, PA-C       loperamide (IMODIUM) capsule 2 mg  2 mg Oral PRN Rhyne, Samantha J, PA-C       metoprolol tartrate (LOPRESSOR) injection 2-5 mg  2-5 mg Intravenous Q2H PRN Rhyne, Samantha J, PA-C       mirtazapine (REMERON) tablet 15 mg  15 mg Oral QHS Rhyne, Samantha J, PA-C   15 mg at 02/07/22 2118   morphine (PF) 2 MG/ML injection 2 mg  2 mg Intravenous Q1H PRN Rhyne, Samantha J, PA-C   2 mg at 02/06/22 1028   ondansetron (ZOFRAN) injection 4 mg  4 mg Intravenous Q6H PRN Rhyne, Samantha J, PA-C       oxyCODONE (Oxy IR/ROXICODONE) immediate release tablet 10 mg  10 mg Oral Q2H PRN Rhyne, Samantha J, PA-C   10 mg at 02/08/22 0930   oxyCODONE-acetaminophen (PERCOCET/ROXICET) 5-325 MG per tablet 1-2 tablet  1-2 tablet Oral Q6H PRN Rhyne, Samantha J, PA-C       pantoprazole (PROTONIX) EC tablet 40 mg  40 mg Oral Daily Rhyne, Samantha J, PA-C   40 mg at 02/08/22 0930   phenol (CHLORASEPTIC) mouth spray 1 spray  1 spray Mouth/Throat PRN Rhyne, Samantha J, PA-C       phenytoin (DILANTIN) ER capsule 200 mg  200 mg Oral QHS  Rhyne, Samantha J, PA-C   200 mg at 02/07/22 2117   phenytoin (DILANTIN) ER capsule 60 mg  60 mg Oral QHS Rhyne, Samantha J, PA-C   60 mg at 02/07/22 2118   rosuvastatin (CRESTOR) tablet 20 mg  20 mg Oral QHS Rhyne, Samantha J, PA-C   20 mg at 02/07/22 2118   sodium chloride flush (NS) 0.9 % injection 3 mL  3 mL Intravenous Q12H Rhyne, Samantha J, PA-C   3 mL at 02/08/22 7619   sodium chloride flush (NS) 0.9 % injection 3 mL  3 mL Intravenous PRN Gabriel Earing, PA-C         Discharge Medications: Please  see discharge summary for a list of discharge medications.  Relevant Imaging Results:  Relevant Lab Results:   Additional Information [id number], Both Covid Vaccines  Milas Gain, LCSWA
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
## 2022-02-08 NOTE — Progress Notes (Addendum)
  Progress Note    02/08/2022 7:44 AM 1 Day Post-Op  Subjective: Patient states he is in some pain.  He slept okay throughout the night   Vitals:   02/08/22 0440 02/08/22 0540  BP: (!) 175/83 (!) 155/70  Pulse: 80   Resp: 16   Temp: 98.1 F (36.7 C)   SpO2: 100% 100%    Physical Exam: Lungs: Nonlabored Extremities: R AKA with gauze and ace wrap dressing on.  Dressing is dry and intact.   CBC    Component Value Date/Time   WBC 11.1 (H) 02/08/2022 0352   RBC 3.67 (L) 02/08/2022 0352   HGB 10.9 (L) 02/08/2022 0352   HCT 32.5 (L) 02/08/2022 0352   PLT 450 (H) 02/08/2022 0352   MCV 88.6 02/08/2022 0352   MCH 29.7 02/08/2022 0352   MCHC 33.5 02/08/2022 0352   RDW 13.5 02/08/2022 0352   LYMPHSABS 1.5 11/06/2021 2335   MONOABS 0.8 11/06/2021 2335   EOSABS 0.4 11/06/2021 2335   BASOSABS 0.0 11/06/2021 2335    BMET    Component Value Date/Time   NA 129 (L) 02/08/2022 0352   NA 141 08/02/2021 1356   K 4.1 02/08/2022 0352   CL 96 (L) 02/08/2022 0352   CO2 23 02/08/2022 0352   GLUCOSE 103 (H) 02/08/2022 0352   BUN 21 02/08/2022 0352   BUN 20 08/02/2021 1356   CREATININE 1.17 02/08/2022 0352   CREATININE 0.73 12/31/2014 1330   CALCIUM 9.0 02/08/2022 0352   GFRNONAA >60 02/08/2022 0352   GFRNONAA >89 10/23/2011 1504   GFRAA >60 11/16/2019 1600   GFRAA >89 10/23/2011 1504    INR    Component Value Date/Time   INR 1.1 11/09/2021 1940     Intake/Output Summary (Last 24 hours) at 02/08/2022 0744 Last data filed at 02/08/2022 0443 Gross per 24 hour  Intake 360 ml  Output 450 ml  Net -90 ml     Assessment/Plan:  66 y.o. male is 1 day postop, s/p: R AKA  -Patient is doing well, pain is controlled -Dressing is still on R AKA and dry -We will take dressing off tomorrow  Vicente Serene, [facility] [phone] 02/08/2022 7:44 AM  I agree with the above.  Have seen and evaluated patient.  He is postoperative day 1 from a right  above-knee amputation.  We will plan for dressing changes tomorrow.  We will need to have PT and OT evaluate him to see if he is a rehab candidate.  [name]

## 2022-02-08 NOTE — Progress Notes (Signed)
[facility] IP rehab admissions - Noted PTOT recommending SNF placement for rehab.  I talked with case manager and she will pursue SNF placement.  Patient has cognitive deficits, cannot remember or carry over education and he lives in an [place] facility.  Call me for questions.  [phone]
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
## 2022-02-08 NOTE — Evaluation (Signed)
Occupational Therapy Evaluation Patient Details Name: [name] MRN: [id number] DOB: 1956-03-24 Today's Date: 02/08/2022   History of Present Illness 66 y/o male who has previously undergone stenting of his right superficial femoral artery for an ulcer. 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  PMH: TBI in 1988, multiple falls, dementia, HTN, seizures, aortic aneurysm   Clinical Impression   Patient is currently requiring assistance with ADLs including up to Max assist of 2 people assist with Lower body ADLs, up to moderate assist with Upper body ADLs at EOB or bed level,  as well as  moderate assist with bed mobility and Moderate assist of 2 people with functional transfers to drop arm recliner to simulate drop arm BSC.  Current level of function is below patient's typical baseline, which is unclear as pt is a poor historian and no family present.  During this evaluation, patient was limited by generalized weakness, impaired activity tolerance, post-op pain and impaired cognition with impaired memory, pt in and out of orientation to place, month and situation, and poor attention to safety commands, all of which has the potential to impact patient's safety and independence during functional mobility, as well as performance for ADLs.  Patient lives at an ALF, but reports that he does not receive assitance with BADLs.   Patient demonstrates fair rehab potential, and should benefit from continued skilled occupational therapy services while in acute care to maximize safety, independence and quality of life at home.  Continued occupational therapy services in a SNF setting prior to return home is recommended.  ?     Recommendations for follow up therapy are one component of a multi-disciplinary discharge planning process, led by the attending physician.  Recommendations may be updated  based on patient status, additional functional criteria and insurance authorization.   Follow Up Recommendations  Skilled nursing-short term rehab (<3 hours/day)    Assistance Recommended at Discharge Frequent or constant Supervision/Assistance  Patient can return home with the following Two people to help with walking and/or transfers;A lot of help with bathing/dressing/bathroom;A lot of help with walking and/or transfers;Assist for transportation;Assistance with cooking/housework;Assistance with feeding;Help with stairs or ramp for entrance;Direct supervision/assist for financial management;Direct supervision/assist for medications management    Functional Status Assessment  Patient has had a recent decline in their functional status and demonstrates the ability to make significant improvements in function in a reasonable and predictable amount of time.  Equipment Recommendations  Other (comment) (May need wheelchair depending on progress)    Recommendations for Other Services       Precautions / Restrictions Precautions Precautions: Fall Precaution Comments: Confused Restrictions Weight Bearing Restrictions: Yes RLE Weight Bearing: Non weight bearing      Mobility Bed Mobility Overal bed mobility: Needs Assistance Bed Mobility: Rolling, Sidelying to Sit Rolling: Min assist Sidelying to sit: Mod assist, HOB elevated       General bed mobility comments: Multimodal cues, ose of bed rail.    Transfers                          Balance Overall balance assessment: History of Falls, Needs assistance Sitting-balance support: Bilateral upper extremity supported (LT foot suported) Sitting balance-Leahy Scale: Poor   Postural control: Posterior lean   Standing balance-Leahy Scale: Poor  ADL either performed or assessed with clinical judgement   ADL Overall ADL's : Needs assistance/impaired Eating/Feeding: Supervision/  safety;Set up;Minimal assistance;Cueing for sequencing;Sitting;Bed level Eating/Feeding Details (indicate cue type and reason): As OT entered room, pt in bed and trying to pour his applesauce on his incentive spirometer. Pt asking, "Pour this for me". Once in chair pt received full setup of meal but required multimodal cues to intiiate eating. Grooming: Sitting;Bed level;Min guard;Set up;Cueing for sequencing   Upper Body Bathing: Minimal assistance;Bed level;Cueing for sequencing   Lower Body Bathing: Maximal assistance;Sitting/lateral leans;Cueing for sequencing;Cueing for safety;Bed level   Upper Body Dressing : Moderate assistance;Sitting;Cueing for safety;Cueing for sequencing Upper Body Dressing Details (indicate cue type and reason): 2nd person due to poor sitting balance at EOB. Lower Body Dressing: Total assistance;Bed level Lower Body Dressing Details (indicate cue type and reason): To don LT sock. Pt declined to attempt. Toilet Transfer: Squat-pivot;Requires drop arm;+2 for physical assistance;Moderate assistance Toilet Transfer Details (indicate cue type and reason): Pt stood x1 from elevated EOB to RW with Mod As +2, pt keeping hands on RW, cues for LT foot with pt struggling to follow commands. Posterior lean. Pt eased back to EOB then Physical Therapist performed bear-hug squat pivot to drop-arm recliner with Mod As and OT offering assist for increased safety. Toileting- Clothing Manipulation and Hygiene: Maximal assistance;Bed level;Cueing for sequencing               Vision   Vision Assessment?: No apparent visual deficits Additional Comments: Pt repeating that he loves to read     Perception     Praxis      Pertinent Vitals/Pain Pain Assessment Pain Assessment: 0-10 Pain Score: 3  Pain Location: RT residual limb Pain Descriptors / Indicators: Tender Pain Intervention(s): Monitored during session, Limited activity within patient's tolerance, Premedicated before  session, Repositioned     Hand Dominance Right   Extremity/Trunk Assessment Upper Extremity Assessment Upper Extremity Assessment: Generalized weakness   Lower Extremity Assessment Lower Extremity Assessment: Defer to PT evaluation       Communication Communication Communication: Other (comment) (Soft Voice)   Cognition Arousal/Alertness: Awake/alert Behavior During Therapy: WFL for tasks assessed/performed Overall Cognitive Status: No family/caregiver present to determine baseline cognitive functioning Area of Impairment: Following commands, Awareness, Memory, Safety/judgement, Problem solving                     Memory: Decreased short-term memory Following Commands: Follows one step commands with increased time Safety/Judgement: Decreased awareness of deficits, Decreased awareness of safety Awareness: Emergent Problem Solving: Slow processing, Difficulty sequencing, Requires verbal cues, Requires tactile cues       General Comments       Exercises     Shoulder Instructions      Home Living Family/patient expects to be discharged to:: Assisted living                             Home Equipment: Conservation officer, nature (2 wheels)   Additional Comments: [name], ALF      Prior Functioning/Environment Prior Level of Function : Needs assist;History of Falls (last six months);Patient poor historian/Family not available             Mobility Comments: living at ALF. *Extracted from previous EVal: "Ambulates with RW but seems to attempt walking in room without AD resulting in falls." ADLs Comments: Pt reports that he does not recevie assitance with his BADLs  however pt is confused and should be considered an unreliable historian.        OT Problem List: Decreased cognition;Pain;Decreased strength;Decreased safety awareness;Decreased activity tolerance;Impaired balance (sitting and/or standing);Decreased knowledge of use of DME or AE;Decreased  knowledge of precautions      OT Treatment/Interventions: Self-care/ADL training;Therapeutic exercise;Therapeutic activities;Cognitive remediation/compensation;DME and/or AE instruction;Patient/family education;Balance training    OT Goals(Current goals can be found in the care plan section) Acute Rehab OT Goals OT Goal Formulation: Patient unable to participate in goal setting Time For Goal Achievement: 02/22/22 Potential to Achieve Goals: Fair ADL Goals Pt Will Perform Lower Body Dressing: bed level;with min assist (With use of rolling and bridging) Pt Will Transfer to Toilet: with min guard assist;bedside commode;squat pivot transfer;stand pivot transfer (Drop arm if needed) Pt Will Perform Toileting - Clothing Manipulation and hygiene: with supervision;sitting/lateral leans Pt/caregiver will Perform Home Exercise Program: Both right and left upper extremity;Increased strength;With Supervision Additional ADL Goal #1: Pt will engage in at least 15 min of EOB functional activities with use of BUEs such as therapeutic exercise, grooming, and UE dressing/bathing without loss of sitting balance and no more assistance than supervision, in order to demonstrate improved activity tolerance and balance needed to perform ADLs safely at home.  OT Frequency: Min 2X/week    Co-evaluation PT/OT/SLP Co-Evaluation/Treatment: Yes (Partial time for safety with mobility) Reason for Co-Treatment: Complexity of the patient's impairments (multi-system involvement);For patient/therapist safety PT goals addressed during session: Mobility/safety with mobility OT goals addressed during session: ADL's and self-care      AM-PAC OT "6 Clicks" Daily Activity     Outcome Measure Help from another person eating meals?: A Little Help from another person taking care of personal grooming?: A Little Help from another person toileting, which includes using toliet, bedpan, or urinal?: A Lot Help from another person bathing  (including washing, rinsing, drying)?: A Lot Help from another person to put on and taking off regular upper body clothing?: A Lot Help from another person to put on and taking off regular lower body clothing?: A Lot 6 Click Score: 14   End of Session Equipment Utilized During Treatment: Gait belt;Rolling walker (2 wheels) Nurse Communication: Other (comment) (Pt in chair, confused, chair alarm)  Activity Tolerance: Patient tolerated treatment well Patient left: in chair;with call bell/phone within reach;with chair alarm set;with SCD's reapplied  OT Visit Diagnosis: Unsteadiness on feet (R26.81);History of falling (Z91.81);Other symptoms and signs involving cognitive function;Pain;Repeated falls (R29.6);Muscle weakness (generalized) (M62.81) Pain - Right/Left: Right Pain - part of body: Leg                Time: 6720-9470 OT Time Calculation (min): 30 min Charges:  OT General Charges $OT Visit: 1 Visit OT Evaluation $OT Eval Moderate Complexity: 1 Mod  Kharon Hixon, OT Acute Rehab Services Office: [phone] 02/08/2022  Julien Girt 02/08/2022, 9:41 AM

## 2022-02-08 NOTE — Progress Notes (Signed)
This chaplain responded the Pt. request to speak to a pastor. The Pt. is awake and sitting in the bedside recliner as he finishes his lunch.  The Pt. pleasantly welcomes the chaplain's presence as he lifts up the loving relationship he has with God. The Pt. shares his reliance on scripture as important part of his faith. The chaplain understands the Pt. sister may have taken his Bible home. The Pt. declines a gift of a Bible from the hospital.  During the visit, the chaplain finds conversation begins with one topic and will lead to the Pt. reiterating his faith and forgetting he is in the hospital.  The Pt. accepted the chaplain's invitation for prayer and F/U spiritual care.  Chaplain Sallyanne Kuster [phone]

## 2022-02-08 NOTE — TOC Progression Note (Signed)
Transition of Care [facility]) - Progression Note    Patient Details  Name: [name] MRN: [id number] Date of Birth: 25-Nov-1955  Transition of Care [facility]) CM/SW [place], [place] Phone Number: 02/08/2022, 1:52 PM  Clinical Narrative:     CSW received consult for possible SNF placement at time of discharge. Due to patients current orientation CSW spoke with patients sister [name] regarding PT recommendation of SNF placement at time of discharge. Patients sister reports patient comes from [place].Patients sister expressed understanding of PT recommendation and is agreeable to SNF placement for patient at time of discharge. Patients sister gave CSW permission to fax out initial referral near the [place] area for possible SNF placement . CSW discussed insurance authorization process with patients sister.Patients sister reports patient has received the COVID vaccines, not sure if boosted. No further questions reported at this time. CSW to continue to follow and assist with discharge planning needs.   Expected Discharge Plan: Assisted Living Barriers to Discharge: Continued Medical Work up  Expected Discharge Plan and Services Expected Discharge Plan: Assisted Living In-house Referral: Clinical Social Work     Living arrangements for the past 2 months: Assisted Living Facility                                       Social Determinants of Health (SDOH) Interventions    Readmission Risk Interventions     No data to display

## 2022-02-09 DIAGNOSIS — I739 Peripheral vascular disease, unspecified: Secondary | ICD-10-CM | POA: Diagnosis not present

## 2022-02-09 LAB — BASIC METABOLIC PANEL
Anion gap: 10 (ref 5–15)
BUN: 16 mg/dL (ref 8–23)
CO2: 21 mmol/L — ABNORMAL LOW (ref 22–32)
Calcium: 8.5 mg/dL — ABNORMAL LOW (ref 8.9–10.3)
Chloride: 94 mmol/L — ABNORMAL LOW (ref 98–111)
Creatinine, Ser: 0.92 mg/dL (ref 0.61–1.24)
GFR, Estimated: >60 mL/min (ref >60)
Glucose, Bld: 119 mg/dL — ABNORMAL HIGH (ref 70–99)
Potassium: 4.1 mmol/L (ref 3.5–5.1)
Sodium: 125 mmol/L — ABNORMAL LOW (ref 135–145)

## 2022-02-09 LAB — RENAL FUNCTION PANEL
Albumin: 3.1 g/dL — ABNORMAL LOW (ref 3.5–5.0)
Anion gap: 9 (ref 5–15)
BUN: 19 mg/dL (ref 8–23)
CO2: 21 mmol/L — ABNORMAL LOW (ref 22–32)
Calcium: 8.4 mg/dL — ABNORMAL LOW (ref 8.9–10.3)
Chloride: 96 mmol/L — ABNORMAL LOW (ref 98–111)
Creatinine, Ser: 1.02 mg/dL (ref 0.61–1.24)
GFR, Estimated: >60 mL/min (ref >60)
Glucose, Bld: 129 mg/dL — ABNORMAL HIGH (ref 70–99)
Phosphorus: 3 mg/dL (ref 2.5–4.6)
Potassium: 4 mmol/L (ref 3.5–5.1)
Sodium: 126 mmol/L — ABNORMAL LOW (ref 135–145)

## 2022-02-09 LAB — CBC
HCT: 30.8 % — ABNORMAL LOW (ref 39.0–52.0)
Hemoglobin: 10.6 g/dL — ABNORMAL LOW (ref 13.0–17.0)
MCH: 29.9 pg (ref 26.0–34.0)
MCHC: 34.4 g/dL (ref 30.0–36.0)
MCV: 87 fL (ref 80.0–100.0)
Platelets: 452 10*3/uL — ABNORMAL HIGH (ref 150–400)
RBC: 3.54 MIL/uL — ABNORMAL LOW (ref 4.22–5.81)
RDW: 13.2 % (ref 11.5–15.5)
WBC: 12.2 10*3/uL — ABNORMAL HIGH (ref 4.0–10.5)
nRBC: 0 % (ref 0.0–0.2)

## 2022-02-09 LAB — OSMOLALITY, URINE: Osmolality, Ur: 392 mOsm/kg (ref 300–900)

## 2022-02-09 LAB — SODIUM, URINE, RANDOM: Sodium, Ur: 106 mmol/L

## 2022-02-09 LAB — MAGNESIUM: Magnesium: 1.8 mg/dL (ref 1.7–2.4)

## 2022-02-09 MED ORDER — ORAL CARE MOUTH RINSE: 15.0000 mL | OROMUCOSAL | Status: DC | PRN

## 2022-02-09 MED ORDER — FUROSEMIDE 10 MG/ML IJ SOLN
40.0000 mg | Freq: Once | INTRAMUSCULAR | Status: AC
  Administered 2022-02-09: 40 mg via INTRAVENOUS
  Filled 2022-02-09: qty 4

## 2022-02-09 NOTE — Progress Notes (Addendum)
  Progress Note    02/09/2022 7:46 AM 2 Days Post-Op  Subjective:  somnolent   Vitals:   02/09/22 0420 02/09/22 0555  BP: (!) 181/77 (!) 149/60  Pulse:    Resp: 16   Temp: 98.5 F (36.9 C)   SpO2: 97% 98%    Physical Exam: Incisions:  R AKA pictured below    CBC    Component Value Date/Time   WBC 12.2 (H) 02/09/2022 0350   RBC 3.54 (L) 02/09/2022 0350   HGB 10.6 (L) 02/09/2022 0350   HCT 30.8 (L) 02/09/2022 0350   PLT 452 (H) 02/09/2022 0350   MCV 87.0 02/09/2022 0350   MCH 29.9 02/09/2022 0350   MCHC 34.4 02/09/2022 0350   RDW 13.2 02/09/2022 0350   LYMPHSABS 1.5 11/06/2021 2335   MONOABS 0.8 11/06/2021 2335   EOSABS 0.4 11/06/2021 2335   BASOSABS 0.0 11/06/2021 2335    BMET    Component Value Date/Time   NA 125 (L) 02/09/2022 0350   NA 141 08/02/2021 1356   K 4.1 02/09/2022 0350   CL 94 (L) 02/09/2022 0350   CO2 21 (L) 02/09/2022 0350   GLUCOSE 119 (H) 02/09/2022 0350   BUN 16 02/09/2022 0350   BUN 20 08/02/2021 1356   CREATININE 0.92 02/09/2022 0350   CREATININE 0.73 12/31/2014 1330   CALCIUM 8.5 (L) 02/09/2022 0350   GFRNONAA >60 02/09/2022 0350   GFRNONAA >89 10/23/2011 1504   GFRAA >60 11/16/2019 1600   GFRAA >89 10/23/2011 1504    INR    Component Value Date/Time   INR 1.1 11/09/2021 1940     Intake/Output Summary (Last 24 hours) at 02/09/2022 0746 Last data filed at 02/08/2022 1811 Gross per 24 hour  Intake 541.52 ml  Output 200 ml  Net 341.52 ml     Assessment/Plan:  66 y.o. male is s/p right above knee amputation  2 Days Post-Op  - R AKA incision is well appearing; dressing changed today; stump sock ordered - continue work with PT/OT; current recommendations are for SNF   [name], PA-C [facility] [phone] 02/09/2022 7:46 AM  I agree with the above assessment and plan by the PA.  He is postop day 2 from a right above-knee amputation.  His incision is healing appropriately.  [name]

## 2022-02-09 NOTE — Progress Notes (Signed)
Pt with no SNF offers as this time, multiple denials. Search expanded to surrounding areas. Will f/u with offers to pt's sister as available.   Wandra Feinstein, MSW, LCSW [phone] (coverage)
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
## 2022-02-09 NOTE — Progress Notes (Signed)
Orthopedic Tech Progress Note Patient Details:  [name] Feb 19, 1956 [id number]  Patient ID: [name], male   DOB: 17-Jun-1956, 66 y.o.   MRN: [id number] Order placed with hanger for AK retention sock. Vernona Rieger 02/09/2022, 9:31 AM

## 2022-02-10 DIAGNOSIS — F419 Anxiety disorder, unspecified: Secondary | ICD-10-CM

## 2022-02-10 DIAGNOSIS — I739 Peripheral vascular disease, unspecified: Secondary | ICD-10-CM | POA: Diagnosis not present

## 2022-02-10 LAB — CBC WITH DIFFERENTIAL/PLATELET
Abs Immature Granulocytes: 0.07 10*3/uL (ref 0.00–0.07)
Basophils Absolute: 0 10*3/uL (ref 0.0–0.1)
Basophils Relative: 0 %
Eosinophils Absolute: 0 10*3/uL (ref 0.0–0.5)
Eosinophils Relative: 0 %
HCT: 30.4 % — ABNORMAL LOW (ref 39.0–52.0)
Hemoglobin: 10.1 g/dL — ABNORMAL LOW (ref 13.0–17.0)
Immature Granulocytes: 1 %
Lymphocytes Relative: 10 %
Lymphs Abs: 1.2 10*3/uL (ref 0.7–4.0)
MCH: 29.2 pg (ref 26.0–34.0)
MCHC: 33.2 g/dL (ref 30.0–36.0)
MCV: 87.9 fL (ref 80.0–100.0)
Monocytes Absolute: 1.6 10*3/uL — ABNORMAL HIGH (ref 0.1–1.0)
Monocytes Relative: 13 %
Neutro Abs: 9.1 10*3/uL — ABNORMAL HIGH (ref 1.7–7.7)
Neutrophils Relative %: 76 %
Platelets: 450 10*3/uL — ABNORMAL HIGH (ref 150–400)
RBC: 3.46 MIL/uL — ABNORMAL LOW (ref 4.22–5.81)
RDW: 13.4 % (ref 11.5–15.5)
WBC: 12 10*3/uL — ABNORMAL HIGH (ref 4.0–10.5)
nRBC: 0 % (ref 0.0–0.2)

## 2022-02-10 LAB — BASIC METABOLIC PANEL
Anion gap: 11 (ref 5–15)
Anion gap: 10 (ref 5–15)
BUN: 19 mg/dL (ref 8–23)
BUN: 19 mg/dL (ref 8–23)
CO2: 21 mmol/L — ABNORMAL LOW (ref 22–32)
CO2: 24 mmol/L (ref 22–32)
Calcium: 8.7 mg/dL — ABNORMAL LOW (ref 8.9–10.3)
Calcium: 8.9 mg/dL (ref 8.9–10.3)
Chloride: 94 mmol/L — ABNORMAL LOW (ref 98–111)
Chloride: 92 mmol/L — ABNORMAL LOW (ref 98–111)
Creatinine, Ser: 1.07 mg/dL (ref 0.61–1.24)
Creatinine, Ser: 1.15 mg/dL (ref 0.61–1.24)
GFR, Estimated: >60 mL/min (ref >60)
GFR, Estimated: >60 mL/min (ref >60)
Glucose, Bld: 114 mg/dL — ABNORMAL HIGH (ref 70–99)
Glucose, Bld: 149 mg/dL — ABNORMAL HIGH (ref 70–99)
Potassium: 4.3 mmol/L (ref 3.5–5.1)
Potassium: 3.8 mmol/L (ref 3.5–5.1)
Sodium: 126 mmol/L — ABNORMAL LOW (ref 135–145)
Sodium: 126 mmol/L — ABNORMAL LOW (ref 135–145)

## 2022-02-10 MED ORDER — SENNA 8.6 MG PO TABS
1.0000 | ORAL_TABLET | Freq: Two times a day (BID) | ORAL | Status: DC
  Administered 2022-02-10 – 2022-02-14 (×9): 8.6 mg via ORAL
  Filled 2022-02-10 (×9): qty 1

## 2022-02-10 MED ORDER — MORPHINE SULFATE (PF) 2 MG/ML IV SOLN
1.0000 mg | INTRAVENOUS | Status: DC | PRN
  Administered 2022-02-10 – 2022-02-13 (×8): 1 mg via INTRAVENOUS
  Filled 2022-02-10 (×9): qty 1

## 2022-02-10 MED ORDER — FUROSEMIDE 10 MG/ML IJ SOLN
40.0000 mg | Freq: Two times a day (BID) | INTRAMUSCULAR | Status: DC
  Administered 2022-02-10 (×2): 40 mg via INTRAVENOUS
  Filled 2022-02-10 (×2): qty 4

## 2022-02-10 NOTE — Progress Notes (Signed)
[facility] [place] PROGRESS NOTE  ASSESSMENT / PLAN: [name] is a 66 y.o. male s/p R AKA. PRN pain control. Diet as tolerated. Appreciate Dr. Bishop Dublin assistance with hyponatremia. Mobilize as able.   SUBJECTIVE: No complaints. More sleepy today per daughter at bedside.   OBJECTIVE: BP (!) 165/74 (BP Location: Right Arm)   Pulse 80   Temp 98.3 F (36.8 C) (Oral)   Resp 16   Ht '5\' 5"'$  (1.651 m)   Wt 48.1 kg   SpO2 96%   BMI 17.65 kg/m   Intake/Output Summary (Last 24 hours) at 02/10/2022 1231 Last data filed at 02/09/2022 1948 Gross per 24 hour  Intake --  Output 950 ml  Net -950 ml    Chronically ill Regular rate and rhythm Unlabored Right above knee amputaiton     Latest Ref Rng & Units 02/10/2022    2:46 AM 02/09/2022    3:50 AM 02/08/2022    3:52 AM  CBC  WBC 4.0 - 10.5 K/uL 12.0  12.2  11.1   Hemoglobin 13.0 - 17.0 g/dL 10.1  10.6  10.9   Hematocrit 39.0 - 52.0 % 30.4  30.8  32.5   Platelets 150 - 400 K/uL 450  452  450         Latest Ref Rng & Units 02/10/2022    2:46 AM 02/09/2022    4:53 PM 02/09/2022    3:50 AM  CMP  Glucose 70 - 99 mg/dL 114  129  119   BUN 8 - 23 mg/dL '19  19  16   '$ Creatinine 0.61 - 1.24 mg/dL 1.07  1.02  0.92   Sodium 135 - 145 mmol/L 126  126  125   Potassium 3.5 - 5.1 mmol/L 4.3  4.0  4.1   Chloride 98 - 111 mmol/L 94  96  94   CO2 22 - 32 mmol/L '21  21  21   '$ Calcium 8.9 - 10.3 mg/dL 8.7  8.4  8.5     Estimated Creatinine Clearance: 47.5 mL/min (by C-G formula based on SCr of 1.07 mg/dL).  Yevonne Aline. Stanford Breed, MD [facility] Phone Number: [phone] 02/10/2022 12:31 PM
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
## 2022-02-10 NOTE — Progress Notes (Signed)
[facility] Progress Note   Assessment/ Plan:    Hyponatremia: initially mild and then worsened with IVFs.  Appears volume replete             - stop IVFs             - collect urine lytes, looks like SIADH             - strict I/O             - Lasix IV BID   2.  S/p R AKA             - per VVS   3.  H/o seizures             - none since 2013             - on AEDs   4.  Dispo: inpt  Subjective:    Seen in room.  Pt's sister at bedside.  Noted a little more confused.  Urine studies look like SIADH, started on Lasix yesterday   Objective:   BP (!) 165/74 (BP Location: Right Arm)   Pulse 80   Temp 98.3 F (36.8 C) (Oral)   Resp 16   Ht '5\' 5"'$  (1.651 m)   Wt 48.1 kg   SpO2 96%   BMI 17.65 kg/m   Intake/Output Summary (Last 24 hours) at 02/10/2022 1140 Last data filed at 02/09/2022 1948 Gross per 24 hour  Intake --  Output 950 ml  Net -950 ml   Weight change:   Physical Exam: GEN NAD, lying in bed HEENT EOMI PERRL, well-healed scars on head NECK no JVD PULM clear CV RRR ABD soft EXT R leg + intact staples NEURO sleeping today  Imaging: No results found.  Labs: BMET Recent Labs  Lab 02/06/22 0601 02/06/22 1631 02/08/22 0352 02/09/22 0350 02/09/22 1653 02/10/22 0246  NA 132*  --  129* 125* 126* 126*  K 4.5  --  4.1 4.1 4.0 4.3  CL 96*  --  96* 94* 96* 94*  CO2  --   --  23 21* 21* 21*  GLUCOSE 109*  --  103* 119* 129* 114*  BUN 29*  --  '21 16 19 19  '$ CREATININE 1.20 1.23 1.17 0.92 1.02 1.07  CALCIUM  --   --  9.0 8.5* 8.4* 8.7*  PHOS  --   --   --   --  3.0  --    CBC Recent Labs  Lab 02/06/22 1631 02/08/22 0352 02/09/22 0350 02/10/22 0246  WBC 13.1* 11.1* 12.2* 12.0*  NEUTROABS  --   --   --  9.1*  HGB 10.7* 10.9* 10.6* 10.1*  HCT 31.7* 32.5* 30.8* 30.4*  MCV 86.8 88.6 87.0 87.9  PLT 426* 450* 452* 450*    Medications:     acetaminophen  1,000 mg Oral Daily   aspirin EC  81 mg Oral Daily   atenolol  50 mg Oral Daily    busPIRone  10 mg Oral TID   cholecalciferol  800 Units Oral Daily   docusate sodium  100 mg Oral Daily   heparin  5,000 Units Subcutaneous Q8H   mirtazapine  15 mg Oral QHS   pantoprazole  40 mg Oral Daily   phenytoin  200 mg Oral QHS   phenytoin  60 mg Oral QHS   rosuvastatin  20 mg Oral QHS   senna  1 tablet Oral BID   sodium chloride flush  3 mL Intravenous Q12H    Madelon Lips, MD 02/10/2022, 11:40 AM
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
## 2022-02-10 NOTE — TOC Progression Note (Signed)
Transition of Care [facility]) - Progression Note    Patient Details  Name: [name] MRN: [id number] Date of Birth: 1956/05/02  Transition of Care [facility]) CM/SW [place], LCSW Phone Number: 02/10/2022, 12:55 PM  Clinical Narrative:     Pt currently does not have any bed offers.  TOC team will continue to assist with discharge planning needs.     Expected Discharge Plan: Assisted Living Barriers to Discharge: Continued Medical Work up  Expected Discharge Plan and Services Expected Discharge Plan: Assisted Living In-house Referral: Clinical Social Work     Living arrangements for the past 2 months: Assisted Living Facility                                       Social Determinants of Health (SDOH) Interventions    Readmission Risk Interventions     No data to display

## 2022-02-11 DIAGNOSIS — I739 Peripheral vascular disease, unspecified: Secondary | ICD-10-CM | POA: Diagnosis not present

## 2022-02-11 LAB — BASIC METABOLIC PANEL
Anion gap: 14 (ref 5–15)
BUN: 22 mg/dL (ref 8–23)
CO2: 21 mmol/L — ABNORMAL LOW (ref 22–32)
Calcium: 8.8 mg/dL — ABNORMAL LOW (ref 8.9–10.3)
Chloride: 89 mmol/L — ABNORMAL LOW (ref 98–111)
Creatinine, Ser: 1.11 mg/dL (ref 0.61–1.24)
GFR, Estimated: >60 mL/min (ref >60)
Glucose, Bld: 98 mg/dL (ref 70–99)
Potassium: 4 mmol/L (ref 3.5–5.1)
Sodium: 124 mmol/L — ABNORMAL LOW (ref 135–145)

## 2022-02-11 LAB — RENAL FUNCTION PANEL
Albumin: 3.5 g/dL (ref 3.5–5.0)
Anion gap: 10 (ref 5–15)
BUN: 23 mg/dL (ref 8–23)
CO2: 26 mmol/L (ref 22–32)
Calcium: 8.6 mg/dL — ABNORMAL LOW (ref 8.9–10.3)
Chloride: 89 mmol/L — ABNORMAL LOW (ref 98–111)
Creatinine, Ser: 1.11 mg/dL (ref 0.61–1.24)
GFR, Estimated: >60 mL/min (ref >60)
Glucose, Bld: 122 mg/dL — ABNORMAL HIGH (ref 70–99)
Phosphorus: 3 mg/dL (ref 2.5–4.6)
Potassium: 3.6 mmol/L (ref 3.5–5.1)
Sodium: 125 mmol/L — ABNORMAL LOW (ref 135–145)

## 2022-02-11 MED ORDER — BISACODYL 10 MG RE SUPP
10.0000 mg | Freq: Once | RECTAL | Status: AC
  Administered 2022-02-11: 10 mg via RECTAL
  Filled 2022-02-11: qty 1

## 2022-02-11 MED ORDER — ALBUMIN HUMAN 25 % IV SOLN
25.0000 g | Freq: Once | INTRAVENOUS | Status: AC
  Administered 2022-02-11: 25 g via INTRAVENOUS
  Filled 2022-02-11: qty 100

## 2022-02-11 MED ORDER — NYSTATIN 100000 UNIT/ML MT SUSP
5.0000 mL | Freq: Four times a day (QID) | OROMUCOSAL | Status: AC
  Administered 2022-02-11 – 2022-02-12 (×7): 500000 [IU] via ORAL
  Filled 2022-02-11 (×7): qty 5
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
## 2022-02-11 NOTE — Progress Notes (Signed)
[facility] [place] PROGRESS NOTE  ASSESSMENT / PLAN: [name] is a 66 y.o. male s/p R AKA. PRN pain control. Diet as tolerated. Appreciate Dr. Bishop Dublin assistance with hyponatremia. Mobilize as able.   SUBJECTIVE: Sleeping comfortably  OBJECTIVE: BP (!) 152/60 (BP Location: Right Arm)   Pulse 78   Temp 97.9 F (36.6 C) (Oral)   Resp 17   Ht '5\' 5"'$  (1.651 m)   Wt 48.1 kg   SpO2 97%   BMI 17.65 kg/m   Intake/Output Summary (Last 24 hours) at 02/11/2022 1407 Last data filed at 02/11/2022 0900 Gross per 24 hour  Intake 120 ml  Output 750 ml  Net -630 ml     Chronically ill Regular rate and rhythm Unlabored Right above knee amputation healing appropriately     Latest Ref Rng & Units 02/10/2022    2:46 AM 02/09/2022    3:50 AM 02/08/2022    3:52 AM  CBC  WBC 4.0 - 10.5 K/uL 12.0  12.2  11.1   Hemoglobin 13.0 - 17.0 g/dL 10.1  10.6  10.9   Hematocrit 39.0 - 52.0 % 30.4  30.8  32.5   Platelets 150 - 400 K/uL 450  452  450         Latest Ref Rng & Units 02/11/2022    6:16 AM 02/10/2022   12:52 PM 02/10/2022    2:46 AM  CMP  Glucose 70 - 99 mg/dL 98  149  114   BUN 8 - 23 mg/dL '22  19  19   '$ Creatinine 0.61 - 1.24 mg/dL 1.11  1.15  1.07   Sodium 135 - 145 mmol/L 124  126  126   Potassium 3.5 - 5.1 mmol/L 4.0  3.8  4.3   Chloride 98 - 111 mmol/L 89  92  94   CO2 22 - 32 mmol/L '21  24  21   '$ Calcium 8.9 - 10.3 mg/dL 8.8  8.9  8.7     Estimated Creatinine Clearance: 45.7 mL/min (by C-G formula based on SCr of 1.11 mg/dL).  Yevonne Aline. Stanford Breed, MD [facility] Phone Number: [phone] 02/11/2022 2:07 PM

## 2022-02-11 NOTE — Progress Notes (Signed)
Patient ID: [name], male   DOB: 12-09-1955, 66 y.o.   MRN: [id number]    Progress Note from the Palliative Medicine Team at [facility]   Patient Name: [name]        Date: 02/11/2022 DOB: April 10, 1956  Age: 66 y.o. MRN#: [id number] Attending Physician: Serafina Mitchell, MD Primary Care Physician: Jeanette Caprice, PA-C Admit Date: 02/06/2022   Medical records reviewed, discussed with treatment team  66 y.o. male   admitted on 02/06/2022 with a long medical history followed  by Dr. Trula Slade:  PAD, ascending thoracic aortic aneurysm which was repaired on 06/11/2014, ischemic cardiomyopathy.       On 02/28/2021 he underwent angiogram with stenting of the right SFA by Dr. Trula Slade for right foot ulcer.    Pt was last seen 07/17/2021 and at that time, he was not having any rest pain or non healing wounds.       Pt resides in SNF for many years. .  He had recently had an acute SDH on the right with maximal thickness of 40m.     There was mild mass effect upon the right hemisphere but no midline shift.     He was seen on 12/15/2021 and his sister had reported he had had 2 falls the month prior.  Patient endorses pain in right foot.   Patient is a smoker and continues to smoke at facility   Today patient was found to have complete occlusion of right iliac and femoral arteries on arteriogram.    Dr. BTrula Sladedetailed options to family to include an attempted bypass, above-the-knee amputation, or shift to full comfort allowing a natural death.      This NP assessed patient at the bedside as a follow up for palliative medicine needs and emotional support.    Decision for above-the-knee amputation,  patient is status post AKA day one, he is alert and oriented to self.  Patient's Sister MSharyn Lullat the bedside    Education offered today regarding  the importance of continued conversation with family and their  medical providers regarding overall plan of care and treatment  options,  ensuring decisions are within the context of the patients values and GOCs.  Questions and concerns addressed      MWadie LessenNP  Palliative Medicine Team Team Phone # [phone] [phone]

## 2022-02-11 NOTE — Progress Notes (Addendum)
[facility] Progress Note   Assessment/ Plan:    Hyponatremia: initially mild and then worsened with IVFs.  Appears volume replete             - stop IVFs             - collect urine lytes, looks like SIADH             - strict I/O             - Na fell again- albumin 3.1- will do 25 g IV albumin x 1, hold lasix, PM recheck   2.  S/p R AKA             - per VVS   3.  H/o seizures             - none since 2013             - on AEDs   4.  Dispo: inpt  Subjective:     Na down to 124.  UOP OK.  Feeling OK, doing a word search in bed   Objective:   BP (!) 152/66   Pulse 81   Temp 97.6 F (36.4 C) (Oral)   Resp 16   Ht '5\' 5"'$  (1.651 m)   Wt 48.1 kg   SpO2 98%   BMI 17.65 kg/m   Intake/Output Summary (Last 24 hours) at 02/11/2022 1048 Last data filed at 02/11/2022 0900 Gross per 24 hour  Intake 120 ml  Output 750 ml  Net -630 ml   Weight change:   Physical Exam: GEN NAD, lying in bed HEENT EOMI PERRL, well-healed scars on head NECK no JVD PULM clear CV RRR ABD soft EXT R leg + intact staples NEURO alert and talkative  Imaging: No results found.  Labs: BMET Recent Labs  Lab 02/06/22 0601 02/06/22 1631 02/08/22 0352 02/09/22 0350 02/09/22 1653 02/10/22 0246 02/10/22 1252 02/11/22 0616  NA 132*  --  129* 125* 126* 126* 126* 124*  K 4.5  --  4.1 4.1 4.0 4.3 3.8 4.0  CL 96*  --  96* 94* 96* 94* 92* 89*  CO2  --   --  23 21* 21* 21* 24 21*  GLUCOSE 109*  --  103* 119* 129* 114* 149* 98  BUN 29*  --  '21 16 19 19 19 22  '$ CREATININE 1.20 1.23 1.17 0.92 1.02 1.07 1.15 1.11  CALCIUM  --   --  9.0 8.5* 8.4* 8.7* 8.9 8.8*  PHOS  --   --   --   --  3.0  --   --   --    CBC Recent Labs  Lab 02/06/22 1631 02/08/22 0352 02/09/22 0350 02/10/22 0246  WBC 13.1* 11.1* 12.2* 12.0*  NEUTROABS  --   --   --  9.1*  HGB 10.7* 10.9* 10.6* 10.1*  HCT 31.7* 32.5* 30.8* 30.4*  MCV 86.8 88.6 87.0 87.9  PLT 426* 450* 452* 450*    Medications:      acetaminophen  1,000 mg Oral Daily   aspirin EC  81 mg Oral Daily   atenolol  50 mg Oral Daily   busPIRone  10 mg Oral TID   cholecalciferol  800 Units Oral Daily   docusate sodium  100 mg Oral Daily   heparin  5,000 Units Subcutaneous Q8H   mirtazapine  15 mg Oral QHS   pantoprazole  40 mg Oral Daily   phenytoin  200 mg  Oral QHS   phenytoin  60 mg Oral QHS   rosuvastatin  20 mg Oral QHS   senna  1 tablet Oral BID   sodium chloride flush  3 mL Intravenous Q12H    Madelon Lips, MD 02/11/2022, 10:48 AM

## 2022-02-12 DIAGNOSIS — G8918 Other acute postprocedural pain: Secondary | ICD-10-CM | POA: Diagnosis not present

## 2022-02-12 DIAGNOSIS — Z515 Encounter for palliative care: Secondary | ICD-10-CM | POA: Diagnosis not present

## 2022-02-12 DIAGNOSIS — I739 Peripheral vascular disease, unspecified: Secondary | ICD-10-CM | POA: Diagnosis not present

## 2022-02-12 LAB — CBC
HCT: 30 % — ABNORMAL LOW (ref 39.0–52.0)
Hemoglobin: 10.1 g/dL — ABNORMAL LOW (ref 13.0–17.0)
MCH: 29.4 pg (ref 26.0–34.0)
MCHC: 33.7 g/dL (ref 30.0–36.0)
MCV: 87.2 fL (ref 80.0–100.0)
Platelets: 577 10*3/uL — ABNORMAL HIGH (ref 150–400)
RBC: 3.44 MIL/uL — ABNORMAL LOW (ref 4.22–5.81)
RDW: 13.2 % (ref 11.5–15.5)
WBC: 12.1 10*3/uL — ABNORMAL HIGH (ref 4.0–10.5)
nRBC: 0 % (ref 0.0–0.2)

## 2022-02-12 LAB — BASIC METABOLIC PANEL
Anion gap: 11 (ref 5–15)
BUN: 18 mg/dL (ref 8–23)
CO2: 26 mmol/L (ref 22–32)
Calcium: 8.9 mg/dL (ref 8.9–10.3)
Chloride: 90 mmol/L — ABNORMAL LOW (ref 98–111)
Creatinine, Ser: 0.96 mg/dL (ref 0.61–1.24)
GFR, Estimated: >60 mL/min (ref >60)
Glucose, Bld: 140 mg/dL — ABNORMAL HIGH (ref 70–99)
Potassium: 3.7 mmol/L (ref 3.5–5.1)
Sodium: 127 mmol/L — ABNORMAL LOW (ref 135–145)

## 2022-02-12 LAB — SURGICAL PATHOLOGY

## 2022-02-12 MED ORDER — OXYCODONE-ACETAMINOPHEN 5-325 MG PO TABS
2.0000 | ORAL_TABLET | Freq: Four times a day (QID) | ORAL | Status: DC | PRN
  Administered 2022-02-12 – 2022-02-14 (×5): 2 via ORAL
  Filled 2022-02-12 (×5): qty 2

## 2022-02-12 MED ORDER — HYDRALAZINE HCL 25 MG PO TABS
25.0000 mg | ORAL_TABLET | Freq: Three times a day (TID) | ORAL | Status: DC
  Administered 2022-02-12 – 2022-02-14 (×7): 25 mg via ORAL
  Filled 2022-02-12 (×7): qty 1

## 2022-02-12 NOTE — Progress Notes (Addendum)
  Progress Note    02/12/2022 8:06 AM 5 Days Post-Op  Subjective:  no complaints   Vitals:   02/11/22 2127 02/12/22 0512  BP: (!) 155/90 (!) 164/69  Pulse:  76  Resp: 16 16  Temp: 97.9 F (36.6 C) 98.1 F (36.7 C)  SpO2:     Physical Exam: Lungs:  non labored  Incisions:  R AKA c/d/I without drainage, viable skin edges Neurologic: A&O  CBC    Component Value Date/Time   WBC 12.0 (H) 02/10/2022 0246   RBC 3.46 (L) 02/10/2022 0246   HGB 10.1 (L) 02/10/2022 0246   HCT 30.4 (L) 02/10/2022 0246   PLT 450 (H) 02/10/2022 0246   MCV 87.9 02/10/2022 0246   MCH 29.2 02/10/2022 0246   MCHC 33.2 02/10/2022 0246   RDW 13.4 02/10/2022 0246   LYMPHSABS 1.2 02/10/2022 0246   MONOABS 1.6 (H) 02/10/2022 0246   EOSABS 0.0 02/10/2022 0246   BASOSABS 0.0 02/10/2022 0246    BMET    Component Value Date/Time   NA 125 (L) 02/11/2022 1437   NA 141 08/02/2021 1356   K 3.6 02/11/2022 1437   CL 89 (L) 02/11/2022 1437   CO2 26 02/11/2022 1437   GLUCOSE 122 (H) 02/11/2022 1437   BUN 23 02/11/2022 1437   BUN 20 08/02/2021 1356   CREATININE 1.11 02/11/2022 1437   CREATININE 0.73 12/31/2014 1330   CALCIUM 8.6 (L) 02/11/2022 1437   GFRNONAA >60 02/11/2022 1437   GFRNONAA >89 10/23/2011 1504   GFRAA >60 11/16/2019 1600   GFRAA >89 10/23/2011 1504    INR    Component Value Date/Time   INR 1.1 11/09/2021 1940     Intake/Output Summary (Last 24 hours) at 02/12/2022 0806 Last data filed at 02/12/2022 0300 Gross per 24 hour  Intake 184.64 ml  Output 800 ml  Net -615.36 ml     Assessment/Plan:  66 y.o. male is s/p R AKA 5 Days Post-Op   R AKA incision is healing well Therapy recommending SNF; TOC working on placement [facility] for discharge when cleared medically; Nephrology and Hospitalist service consulted for hyponatremia and medical management   Chris Ligas, PA-C [facility] [phone] 02/12/2022 8:06 AM   I have seen and evaluated the patient and  agree with the plan outlined by the PA above.  I appreciate assistance from nephrology and hospitalist service.  [name]

## 2022-02-12 NOTE — Progress Notes (Signed)
Patient ID: [name], male   DOB: 1956/05/13, 66 y.o.   MRN: [id number]    Progress Note from the Palliative Medicine Team at [facility]   Patient Name: [name]        Date: 02/12/2022 DOB: 1956/03/29  Age: 66 y.o. MRN#: [id number] Attending Physician: Serafina Mitchell, MD Primary Care Physician: Jeanette Caprice, PA-C Admit Date: 02/06/2022   Medical records reviewed, discussed with treatment team  66 y.o. male   admitted on 02/06/2022 with a long medical history followed  by Dr. Trula Slade:  PAD, ascending thoracic aortic aneurysm which was repaired on 06/11/2014, ischemic cardiomyopathy.       On 02/28/2021 he underwent angiogram with stenting of the right SFA by Dr. Trula Slade for right foot ulcer.    Pt was last seen 07/17/2021 and at that time, he was not having any rest pain or non healing wounds.       Pt resides in SNF for many years. .  He had recently had an acute SDH on the right with maximal thickness of 60m.     There was mild mass effect upon the right hemisphere but no midline shift.     He was seen on 12/15/2021 and his sister had reported he had had 2 falls the month prior.     Patient is a smoker and continues to smoke at facility   On assessment by Dr BTrula Sladehe was found with complete occlusion of right iliac and femoral arteries on arteriogram.    Dr. BTrula Sladedetailed options to family to include an attempted bypass, above-the-knee amputation, or shift to full comfort allowing a natural death.  Decision made for Right AKA, patient is post op day 5.    This NP assessed patient at the bedside as a follow up for palliative medicine needs and emotional support.    Patient is alert, complaining of pain right limb/status post AKA. Patient is alert only to self, limited insight into his current medical situation.  Symptom management, discussed with nursing, education offered on utilization of prescribed meds.       -Increase Percocet today-two  tablets p.o. every 6 hours as needed for pain             Hope is for continued improvement and discharged back to his skilled nursing facility for ongoing long-term care.  Recommend outpatient community-based palliative services on discharge.   MWadie LessenNP  Palliative Medicine Team Team Phone # [phone] [phone]
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
## 2022-02-12 NOTE — TOC Progression Note (Addendum)
Transition of Care [facility] - Progression Note    Patient Details  Name: SHAHMEER BUNN MRN: [id number] Date of Birth: August 04, 1955  Transition of Care [facility]) CM/SW [place], [place] Phone Number: 02/12/2022, 11:25 AM  Clinical Narrative:     Update-CSW received callback from patients sister [name] who gave CSW permission to make referral to [facility] for palliative services to follow patient at SNF. CSW made referral to [facility] with [facility] for palliative services to follow patient at SNF.  Update- CSW received callback from patients sister [name] who accepted SNF bed offer with [facility] for patient. CSW informed Crystal with [employer]. Crystal confirmed she will start insurance authorization for patient. Patients sister informed CSW will callback regarding palliative consult once she speaks with [facility] with palliative. CSW will continue to follow.  Update-12:30pm- CSW spoke with patients sister [name] regarding SNF bed offers. Patients sister would like to speak with family regarding SNF bed offer with [place] and call CSW back. CSW received consult for palliative services to follow patient at SNF. CSW spoke with [name] regarding referral. [name] is going to call [facility] with Palliative to assist with answering a couple of questions she has and give CSW a callback regarding SNF bed offer and answer to whether she wants palliative services to follow patient at SNF. CSW will continue to follow.   CSW LVM for patients sister [name]. CSW awaiting callback to go over SNF bed offers for patient. CSW will continue to follow and assist with patients dc planning needs.  Expected Discharge Plan: Assisted Living Barriers to Discharge: Continued Medical Work up  Expected Discharge Plan and Services Expected Discharge Plan: Assisted Living In-house Referral: Clinical Social Work     Living arrangements for the past 2 months: Assisted Living Facility                                        Social Determinants of Health (SDOH) Interventions    Readmission Risk Interventions     No data to display

## 2022-02-12 NOTE — Progress Notes (Signed)
[facility] Progress Note   Assessment/ Plan:    Mild ASx Hyponatremia: most c/w SIADH             - off IVFs, cont 1.5L restriction              strict I/O            2.  S/p R AKA             - per VVS   3.  H/o seizures             - none since 2013             - on AEDs   Subjective:     Na up today 127.    0.8L UOP Family at bedside   Latest Reference Range & Units 02/09/22 19:43  Osmolality, Urine 300 - 900 mOsm/kg 392  Sodium, Urine mmol/L 106     Objective:   BP (!) 152/73   Pulse 80   Temp 97.8 F (36.6 C) (Oral)   Resp 17   Ht '5\' 5"'$  (1.651 m)   Wt 48.1 kg   SpO2 100%   BMI 17.65 kg/m   Intake/Output Summary (Last 24 hours) at 02/12/2022 1115 Last data filed at 02/12/2022 0300 Gross per 24 hour  Intake 64.64 ml  Output 800 ml  Net -735.36 ml    Weight change:   Physical Exam: GEN NAD, lying in bed HEENT EOMI PERRL, well-healed scars on head NECK no JVD PULM clear CV RRR ABD soft EXT R leg + intact staples NEURO alert and talkative  Imaging: No results found.  Labs: BMET Recent Labs  Lab 02/09/22 0350 02/09/22 1653 02/10/22 0246 02/10/22 1252 02/11/22 0616 02/11/22 1437 02/12/22 0916  NA 125* 126* 126* 126* 124* 125* 127*  K 4.1 4.0 4.3 3.8 4.0 3.6 3.7  CL 94* 96* 94* 92* 89* 89* 90*  CO2 21* 21* 21* 24 21* 26 26  GLUCOSE 119* 129* 114* 149* 98 122* 140*  BUN '16 19 19 19 22 23 18  '$ CREATININE 0.92 1.02 1.07 1.15 1.11 1.11 0.96  CALCIUM 8.5* 8.4* 8.7* 8.9 8.8* 8.6* 8.9  PHOS  --  3.0  --   --   --  3.0  --     CBC Recent Labs  Lab 02/08/22 0352 02/09/22 0350 02/10/22 0246 02/12/22 0916  WBC 11.1* 12.2* 12.0* 12.1*  NEUTROABS  --   --  9.1*  --   HGB 10.9* 10.6* 10.1* 10.1*  HCT 32.5* 30.8* 30.4* 30.0*  MCV 88.6 87.0 87.9 87.2  PLT 450* 452* 450* 577*     Medications:     acetaminophen  1,000 mg Oral Daily   aspirin EC  81 mg Oral Daily   atenolol  50 mg Oral Daily   busPIRone  10 mg Oral TID    cholecalciferol  800 Units Oral Daily   docusate sodium  100 mg Oral Daily   heparin  5,000 Units Subcutaneous Q8H   hydrALAZINE  25 mg Oral Q8H   mirtazapine  15 mg Oral QHS   nystatin  5 mL Oral QID   pantoprazole  40 mg Oral Daily   phenytoin  200 mg Oral QHS   phenytoin  60 mg Oral QHS   rosuvastatin  20 mg Oral QHS   senna  1 tablet Oral BID   sodium chloride flush  3 mL Intravenous Q12H   Rexene Agent, MD  02/12/2022, 11:15 AM

## 2022-02-13 DIAGNOSIS — I739 Peripheral vascular disease, unspecified: Secondary | ICD-10-CM | POA: Diagnosis not present

## 2022-02-13 LAB — BASIC METABOLIC PANEL
Anion gap: 12 (ref 5–15)
BUN: 16 mg/dL (ref 8–23)
CO2: 24 mmol/L (ref 22–32)
Calcium: 9.1 mg/dL (ref 8.9–10.3)
Chloride: 94 mmol/L — ABNORMAL LOW (ref 98–111)
Creatinine, Ser: 0.87 mg/dL (ref 0.61–1.24)
GFR, Estimated: >60 mL/min (ref >60)
Glucose, Bld: 119 mg/dL — ABNORMAL HIGH (ref 70–99)
Potassium: 3.9 mmol/L (ref 3.5–5.1)
Sodium: 130 mmol/L — ABNORMAL LOW (ref 135–145)

## 2022-02-13 MED ORDER — GABAPENTIN 300 MG PO CAPS
300.0000 mg | ORAL_CAPSULE | Freq: Two times a day (BID) | ORAL | Status: DC
  Administered 2022-02-13 – 2022-02-14 (×3): 300 mg via ORAL
  Filled 2022-02-13 (×3): qty 1

## 2022-02-13 NOTE — TOC Progression Note (Addendum)
Transition of Care [facility]) - Progression Note    Patient Details  Name: [name] MRN: [id number] Date of Birth: 1955-09-14  Transition of Care [facility] CM/SW [place], [place] Phone Number: 02/13/2022, 11:32 AM  Clinical Narrative:     Donella Stade with [facility] confirmed insurance authorization approved. Patient has SNF bed at [facility] with palliative services to follow when medically ready for dc. CSW informed MD.CSW will continue to follow and assist with patients dc planning needs.   Expected Discharge Plan: Assisted Living Barriers to Discharge: Continued Medical Work up  Expected Discharge Plan and Services Expected Discharge Plan: Assisted Living In-house Referral: Clinical Social Work     Living arrangements for the past 2 months: Assisted Living Facility                                       Social Determinants of Health (SDOH) Interventions    Readmission Risk Interventions     No data to display

## 2022-02-13 NOTE — Progress Notes (Signed)
[facility] Progress Note   Assessment/ Plan:    Mild ASx Hyponatremia: most c/w SIADH             - off IVFs, cont 1.5L restriction              strict I/O  Check labs            2.  S/p R AKA             - per VVS   3.  H/o seizures             - none since 2013             - on AEDs  Will sign off for now.  Please call with any questions or concerns.  Pt does not need follow up with nephrology after discharge   Subjective:     SNa 130 this AM No c/o For SNF, pending   Latest Reference Range & Units 02/09/22 19:43  Osmolality, Urine 300 - 900 mOsm/kg 392  Sodium, Urine mmol/L 106     Objective:   BP (!) 179/83 (BP Location: Right Arm)   Pulse 86   Temp 98 F (36.7 C) (Oral)   Resp 15   Ht '5\' 5"'$  (1.651 m)   Wt 48.1 kg   SpO2 98%   BMI 17.65 kg/m   Intake/Output Summary (Last 24 hours) at 02/13/2022 1332 Last data filed at 02/13/2022 1053 Gross per 24 hour  Intake --  Output 1350 ml  Net -1350 ml    Weight change:   Physical Exam: GEN NAD, lying in bed HEENT EOMI PERRL, well-healed scars on head NECK no JVD PULM clear CV RRR ABD soft EXT RLE bandaged NEURO alert and talkative  Imaging: No results found.  Labs: BMET Recent Labs  Lab 02/09/22 1653 02/10/22 0246 02/10/22 1252 02/11/22 0616 02/11/22 1437 02/12/22 0916 02/13/22 1203  NA 126* 126* 126* 124* 125* 127* 130*  K 4.0 4.3 3.8 4.0 3.6 3.7 3.9  CL 96* 94* 92* 89* 89* 90* 94*  CO2 21* 21* 24 21* '26 26 24  '$ GLUCOSE 129* 114* 149* 98 122* 140* 119*  BUN '19 19 19 22 23 18 16  '$ CREATININE 1.02 1.07 1.15 1.11 1.11 0.96 0.87  CALCIUM 8.4* 8.7* 8.9 8.8* 8.6* 8.9 9.1  PHOS 3.0  --   --   --  3.0  --   --     CBC Recent Labs  Lab 02/08/22 0352 02/09/22 0350 02/10/22 0246 02/12/22 0916  WBC 11.1* 12.2* 12.0* 12.1*  NEUTROABS  --   --  9.1*  --   HGB 10.9* 10.6* 10.1* 10.1*  HCT 32.5* 30.8* 30.4* 30.0*  MCV 88.6 87.0 87.9 87.2  PLT 450* 452* 450* 577*      Medications:     acetaminophen  1,000 mg Oral Daily   aspirin EC  81 mg Oral Daily   atenolol  50 mg Oral Daily   busPIRone  10 mg Oral TID   cholecalciferol  800 Units Oral Daily   docusate sodium  100 mg Oral Daily   gabapentin  300 mg Oral BID   heparin  5,000 Units Subcutaneous Q8H   hydrALAZINE  25 mg Oral Q8H   mirtazapine  15 mg Oral QHS   pantoprazole  40 mg Oral Daily   phenytoin  200 mg Oral QHS   phenytoin  60 mg Oral QHS   rosuvastatin  20 mg Oral  QHS   senna  1 tablet Oral BID   sodium chloride flush  3 mL Intravenous Q12H   Rexene Agent, MD  02/13/2022, 1:32 PM

## 2022-02-13 NOTE — Progress Notes (Addendum)
  Progress Note    02/13/2022 10:54 AM 6 Days Post-Op  Subjective:  VVS was called to evaluate L foot pain.  Patient was not experiencing any pain by the time of my exam.   Vitals:   02/13/22 0400 02/13/22 0833  BP: (!) 169/83 (!) 179/83  Pulse:  86  Resp: 15 15  Temp: 98 F (36.7 C) 98 F (36.7 C)  SpO2: 98% 98%   Physical Exam: Lungs:  non labored Incisions:  R AKA c/d/i Extremities:  L foot warm to touch; monophasic L ATA by doppler Neurologic: A&O  CBC    Component Value Date/Time   WBC 12.1 (H) 02/12/2022 0916   RBC 3.44 (L) 02/12/2022 0916   HGB 10.1 (L) 02/12/2022 0916   HCT 30.0 (L) 02/12/2022 0916   PLT 577 (H) 02/12/2022 0916   MCV 87.2 02/12/2022 0916   MCH 29.4 02/12/2022 0916   MCHC 33.7 02/12/2022 0916   RDW 13.2 02/12/2022 0916   LYMPHSABS 1.2 02/10/2022 0246   MONOABS 1.6 (H) 02/10/2022 0246   EOSABS 0.0 02/10/2022 0246   BASOSABS 0.0 02/10/2022 0246    BMET    Component Value Date/Time   NA 127 (L) 02/12/2022 0916   NA 141 08/02/2021 1356   K 3.7 02/12/2022 0916   CL 90 (L) 02/12/2022 0916   CO2 26 02/12/2022 0916   GLUCOSE 140 (H) 02/12/2022 0916   BUN 18 02/12/2022 0916   BUN 20 08/02/2021 1356   CREATININE 0.96 02/12/2022 0916   CREATININE 0.73 12/31/2014 1330   CALCIUM 8.9 02/12/2022 0916   GFRNONAA >60 02/12/2022 0916   GFRNONAA >89 10/23/2011 1504   GFRAA >60 11/16/2019 1600   GFRAA >89 10/23/2011 1504    INR    Component Value Date/Time   INR 1.1 11/09/2021 1940     Intake/Output Summary (Last 24 hours) at 02/13/2022 1054 Last data filed at 02/13/2022 1053 Gross per 24 hour  Intake --  Output 1350 ml  Net -1350 ml     Assessment/Plan:  66 y.o. male is s/p R AKA 6 Days Post-Op   R AKA healing well; staple removal in 3-4 weeks L foot currently asymptomatic; angiogram demonstrated occluded L SFA and occlusion of all 3 tibial vessels.  Patient would likely require an amputation he were to develop rest pain or tissue  loss Ok for discharge to SNF when approved   [name], PA-C [facility] [phone] 02/13/2022 10:54 AM   I agree with the above.  I have seen and evaluated the patient.  He has faint Doppler signals in the peroneal and anterior tibial artery.  There is no sign of ischemic changes to the left foot.  His right above-knee amputation is healing appropriately.  He is now approved for SNF discharge.  Hopefully he can go tomorrow.  [name]
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
## 2022-02-13 NOTE — Progress Notes (Signed)
[facility] Progress Note   Assessment/ Plan:    Mild ASx Hyponatremia: most c/w SIADH             - off IVFs, cont 1.5L restriction              strict I/O  Check labs            2.  S/p R AKA             - per VVS   3.  H/o seizures             - none since 2013             - on AEDs   Subjective:     No AML yet No c/o For SNF, pending   Latest Reference Range & Units 02/09/22 19:43  Osmolality, Urine 300 - 900 mOsm/kg 392  Sodium, Urine mmol/L 106     Objective:   BP (!) 179/83 (BP Location: Right Arm)   Pulse 86   Temp 98 F (36.7 C) (Oral)   Resp 15   Ht '5\' 5"'$  (1.651 m)   Wt 48.1 kg   SpO2 98%   BMI 17.65 kg/m   Intake/Output Summary (Last 24 hours) at 02/13/2022 1151 Last data filed at 02/13/2022 1053 Gross per 24 hour  Intake --  Output 1350 ml  Net -1350 ml    Weight change:   Physical Exam: GEN NAD, lying in bed HEENT EOMI PERRL, well-healed scars on head NECK no JVD PULM clear CV RRR ABD soft EXT R leg + intact staples NEURO alert and talkative  Imaging: No results found.  Labs: BMET Recent Labs  Lab 02/09/22 0350 02/09/22 1653 02/10/22 0246 02/10/22 1252 02/11/22 0616 02/11/22 1437 02/12/22 0916  NA 125* 126* 126* 126* 124* 125* 127*  K 4.1 4.0 4.3 3.8 4.0 3.6 3.7  CL 94* 96* 94* 92* 89* 89* 90*  CO2 21* 21* 21* 24 21* 26 26  GLUCOSE 119* 129* 114* 149* 98 122* 140*  BUN '16 19 19 19 22 23 18  '$ CREATININE 0.92 1.02 1.07 1.15 1.11 1.11 0.96  CALCIUM 8.5* 8.4* 8.7* 8.9 8.8* 8.6* 8.9  PHOS  --  3.0  --   --   --  3.0  --     CBC Recent Labs  Lab 02/08/22 0352 02/09/22 0350 02/10/22 0246 02/12/22 0916  WBC 11.1* 12.2* 12.0* 12.1*  NEUTROABS  --   --  9.1*  --   HGB 10.9* 10.6* 10.1* 10.1*  HCT 32.5* 30.8* 30.4* 30.0*  MCV 88.6 87.0 87.9 87.2  PLT 450* 452* 450* 577*     Medications:     acetaminophen  1,000 mg Oral Daily   aspirin EC  81 mg Oral Daily   atenolol  50 mg Oral Daily   busPIRone   10 mg Oral TID   cholecalciferol  800 Units Oral Daily   docusate sodium  100 mg Oral Daily   gabapentin  300 mg Oral BID   heparin  5,000 Units Subcutaneous Q8H   hydrALAZINE  25 mg Oral Q8H   mirtazapine  15 mg Oral QHS   pantoprazole  40 mg Oral Daily   phenytoin  200 mg Oral QHS   phenytoin  60 mg Oral QHS   rosuvastatin  20 mg Oral QHS   senna  1 tablet Oral BID   sodium chloride flush  3 mL Intravenous Q12H   Darneshia Demary B  Joelyn Oms, MD  02/13/2022, 11:51 AM

## 2022-02-13 NOTE — Progress Notes (Signed)
[facility] ([facility]) Hospital Liaison Note  Notified by TOC manager of patient/family request for [facility] palliative services at home after discharge.   [facility] hospital liaison will follow patient for discharge disposition.   Please call with any hospice or outpatient palliative care related questions.   Thank you for the opportunity to participate in this patient's care.   Shanita Wicker, LCSW [facility] Hospital Liaison [phone]  

## 2022-02-14 DIAGNOSIS — I739 Peripheral vascular disease, unspecified: Secondary | ICD-10-CM | POA: Diagnosis not present

## 2022-02-14 MED ORDER — OXYCODONE-ACETAMINOPHEN 5-325 MG PO TABS: 1.0000 | ORAL_TABLET | Freq: Four times a day (QID) | ORAL | 0 refills | Status: DC | PRN

## 2022-02-14 NOTE — Progress Notes (Signed)
  Progress Note    02/14/2022 10:07 AM 7 Days Post-Op  Subjective:  Patient is ready to leave. Some pain in R AKA   Vitals:   02/14/22 0443 02/14/22 0903  BP: (!) 173/72 (!) 123/58  Pulse: 76 79  Resp: 16 16  Temp: (!) 97.5 F (36.4 C) 98.3 F (36.8 C)  SpO2:  99%    Physical Exam: Lungs:  nonlabored Incisions:  R AKA incision c/d/l Extremities:  L foot warm to touch Neuro: intact  CBC    Component Value Date/Time   WBC 12.1 (H) 02/12/2022 0916   RBC 3.44 (L) 02/12/2022 0916   HGB 10.1 (L) 02/12/2022 0916   HCT 30.0 (L) 02/12/2022 0916   PLT 577 (H) 02/12/2022 0916   MCV 87.2 02/12/2022 0916   MCH 29.4 02/12/2022 0916   MCHC 33.7 02/12/2022 0916   RDW 13.2 02/12/2022 0916   LYMPHSABS 1.2 02/10/2022 0246   MONOABS 1.6 (H) 02/10/2022 0246   EOSABS 0.0 02/10/2022 0246   BASOSABS 0.0 02/10/2022 0246    BMET    Component Value Date/Time   NA 130 (L) 02/13/2022 1203   NA 141 08/02/2021 1356   K 3.9 02/13/2022 1203   CL 94 (L) 02/13/2022 1203   CO2 24 02/13/2022 1203   GLUCOSE 119 (H) 02/13/2022 1203   BUN 16 02/13/2022 1203   BUN 20 08/02/2021 1356   CREATININE 0.87 02/13/2022 1203   CREATININE 0.73 12/31/2014 1330   CALCIUM 9.1 02/13/2022 1203   GFRNONAA >60 02/13/2022 1203   GFRNONAA >89 10/23/2011 1504   GFRAA >60 11/16/2019 1600   GFRAA >89 10/23/2011 1504    INR    Component Value Date/Time   INR 1.1 11/09/2021 1940     Intake/Output Summary (Last 24 hours) at 02/14/2022 1007 Last data filed at 02/13/2022 1053 Gross per 24 hour  Intake --  Output 950 ml  Net -950 ml     Assessment/Plan:  66 y.o. male is 7 days post op, s/p: R AKA  -R AKA incision healing well. Will arrange follow up with our clinic in 3-4 weeks for staple removal -L foot is warm and well perfused. Still no signs of ischemia -Will place orders for discharge to SNF today  Vicente Serene, PA-C [facility] [phone] 02/14/2022 10:07 AM

## 2022-02-14 NOTE — TOC Transition Note (Signed)
Transition of Care [facility]) - CM/SW Discharge Note   Patient Details  Name: [name] MRN: [id number] Date of Birth: Nov 13, 1955  Transition of Care [facility] CM/SW Contact:  Milas Gain, [place] Phone Number: 02/14/2022, 11:33 AM   Clinical Narrative:     [facility] will DC to: [facility]  Anticipated DC date: 02/14/2022  Family notified: [name]  Transport by: Corey Harold   ?  Per MD patient ready for DC to [facility] with palliative services to follow . RN, patient, patient's family, Fabio Pierce with [facility] facility notified of DC. Discharge Summary sent to facility. RN given number for report tele# [phone] RM# [id number]. DC packet on chart. Ambulance transport requested for patient.  CSW signing off.   Final next level of care: Skilled Nursing Facility Barriers to Discharge: No Barriers Identified   Patient Goals and CMS Choice   CMS Medicare.gov Compare Post Acute Care list provided to:: Patient Represenative (must comment) (Patients sister [name]) Choice offered to / list presented to : Sibling  Discharge Placement              Patient chooses bed at: [facility] Patient to be transferred to facility by: [facility] Name of family member notified: [name] Patient and family notified of of transfer: 02/14/22  Discharge Plan and Services In-house Referral: Clinical Social Work                                   Social Determinants of Health ([place]) Interventions     Readmission Risk Interventions     No data to display
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
## 2022-02-14 NOTE — Discharge Summary (Signed)
Discharge Summary  Patient ID: [name] [id number] 66 y.o. [birth year]  Admit date: 02/06/2022  Discharge date and time: No discharge date for patient encounter.   Admitting Physician: Serafina Mitchell, MD   Discharge Physician: Serafina Mitchell, MD   Admission Diagnoses: PAD (peripheral artery disease) [facility]) [I73.9]  Discharge Diagnoses: PAD (peripheral artery disease) ([facility]) [I73.9]  Admission Condition: fair  Discharged Condition: fair  Indication for Admission: This is a 66 year old gentleman who has previously undergone stenting of his right superficial femoral artery for an ulcer.  He was seen 3 weeks ago and found to have a stenosis within his external iliac artery and was scheduled for angiography yesterday.  He does report a several day history of severe right leg pain.  He has abrasions and infection on the right foot.  He underwent angiography yesterday and was found to have occlusion of the vasculature in the right leg beginning at the common femoral artery.  Because of his underlying comorbidities as well as the appearance of his foot (he does not have motor or sensory function), I felt that amputation was his best option for survival.  We did discuss the possibility of revascularization, however I did not think this would give him a good outcome because of the duration of ischemia.  Patient and family are all in agreement to proceed with amputation.  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 He was transported to the PACU in stable condition. Dressing was taken down on POD 2 without issue.   R AKA continued to healed throughout the course of hospitalization. Nephrology and hospitalist service were consulted to help manage patient's hyponatremia. 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 Fluids were restricted and hyponatremia normalized. No nephrology follow up recommended at discharge.  Patient continued to tolerate a normal  diet and increase in mobility throughout hospitalization.   Discharge to SNF on 8/23  Consults: nephrology and hospital service  Treatments: IV hydration, antibiotics: Ancef, analgesia: Percocet, Morphine anticoagulation: subcutaneous heparin, and surgery: R AKA  Discharge Exam: See progress note  Vitals:   02/14/22 0443 02/14/22 0903  BP: (!) 173/72 (!) 123/58  Pulse: 76 79  Resp: 16 16  Temp: (!) 97.5 F (36.4 C) 98.3 F (36.8 C)  SpO2:  99%     Disposition: Discharge disposition: 03-Skilled Nursing Facility       Patient Instructions:  Allergies as of 02/14/2022       Reactions   Isovue [iopamidol] Hives   Pt broke out in one hive on his chest after contrast on 01/03/15.  Pt will need full premeds in the future per Dr Martinique.          Medication List     TAKE these medications    acetaminophen 500 MG tablet Commonly known as: TYLENOL Take 500-1,000 mg by mouth See admin instructions. Take 2 tablets (1000 mg) by mouth in the morning oral & take 1 tablet (500 mg) by mouth every 6 hours as needed for fever, headache, pain.   aluminum-magnesium hydroxide-simethicone 144-315-40 MG/5ML Susp Commonly known as: MAALOX Take 30 mLs by mouth every 6 (six) hours as needed (heartburn/indigestion).   aspirin EC 81 MG tablet Take 1 tablet (81 mg total) by mouth daily.   atenolol 50 MG tablet Commonly known as: TENORMIN Take 1 tablet (50 mg total) by mouth daily.   busPIRone 10 MG tablet Commonly known as: BUSPAR Take 10 mg by mouth 3 (three) times  daily.   CertaVite/Antioxidants Tabs Take 1 tablet by mouth in the morning.   clopidogrel 75 MG tablet Commonly known as: Plavix Take 1 tablet (75 mg total) by mouth daily.   GNP Vitamin D3 10 MCG (400 UNIT) Tabs tablet Generic drug: cholecalciferol Take 800 Units by mouth daily.   guaiFENesin 100 MG/5ML liquid Commonly known as: ROBITUSSIN Take 10 mLs by mouth every 6 (six) hours as needed for cough.    loperamide 2 MG capsule Commonly known as: IMODIUM Take 2 mg by mouth as needed for diarrhea or loose stools. Do not exceed '16mg'$  in 24 hours   magnesium hydroxide 400 MG/5ML suspension Commonly known as: MILK OF MAGNESIA Take 30 mLs by mouth at bedtime as needed for mild constipation.   mineral oil-hydrophilic petrolatum ointment Apply 1 application topically daily. Apply topically to arms, legs and feet daily for dry skin   mirtazapine 15 MG tablet Commonly known as: REMERON Take 15 mg by mouth at bedtime.   neomycin-bacitracin-polymyxin 400-10-4998 ointment Commonly known as: NEOSPORIN Apply 1 application  topically See admin instructions. Apply once daily in the morning to right leg & as needed for skin tears or abrasions.   Nutritional Supplement Liqd Take 1 Bottle by mouth 3 (three) times daily. Mighty Shake   oxyCODONE-acetaminophen 5-325 MG tablet Commonly known as: PERCOCET/ROXICET Take 1-2 tablets by mouth every 6 (six) hours as needed for moderate pain.   phenytoin 100 MG ER capsule Commonly known as: DILANTIN Take 200 mg by mouth at bedtime. Along with 60 mg dose   phenytoin 30 MG ER capsule Commonly known as: DILANTIN Take 60 mg by mouth at bedtime. Along with 200 mg dose   pravastatin 40 MG tablet Commonly known as: PRAVACHOL Take 40 mg by mouth at bedtime.               Discharge Care Instructions  (From admission, onward)           Start     Ordered   02/14/22 0000  Discharge wound care:       Comments: Keep clean and dry. Wash with warm water and mild soap, pat dry.   02/14/22 1013           Activity: activity as tolerated Diet: low fat, low cholesterol diet Wound Care: keep wound clean and dry  Follow-up with [facility] in 4 weeks.  SignedGerri Lins, PA-C 02/14/2022 10:16 AM [facility] Office: [phone]

## 2022-02-14 NOTE — Progress Notes (Signed)
Patient seen and examined, lab work from yesterday sodium improved to 130.Nephrology has signed off.  Patient is stable for transfer to rehab.

## 2022-02-14 NOTE — Progress Notes (Signed)
Reported attempted x2. [facility] here for transport.

## 2022-02-21 ENCOUNTER — Telehealth: Payer: Self-pay

## 2022-02-21 NOTE — Telephone Encounter (Signed)
Shante at [facility] called stating that the provider saw the pt today and was concerned about his incision. She was requesting an earlier appt.  Reviewed pt's chart, returned call for clarification, two identifiers used. She states that the pt has had some serosanguinous drainage, erythema, and swelling at his R AKA since Friday. Pt does not have a fever at this time. Appt scheduled, transportation in place, sister to accompany pt. Confirmed understanding.

## 2022-02-22 ENCOUNTER — Ambulatory Visit (INDEPENDENT_AMBULATORY_CARE_PROVIDER_SITE_OTHER): Payer: Medicaid Other | Admitting: Physician Assistant

## 2022-02-22 VITALS — BP 166/80 | HR 90 | Temp 97.7°F | Resp 95 | Ht 65.0 in | Wt 106.0 lb

## 2022-02-22 DIAGNOSIS — I739 Peripheral vascular disease, unspecified: Secondary | ICD-10-CM

## 2022-02-22 NOTE — Progress Notes (Signed)
POST OPERATIVE OFFICE NOTE    CC:  F/u for surgery  HPI:  [name] is a 66 y.o. male who is s/p right AKA on 02/07/2022 by Dr. Trula Slade.  The patient has previously undergone stenting of his right superficial femoral artery on 02/28/2021 for an ulcer. He he underwent angiography on 02/06/2022 due to increased rest pain and a drop in ABIs, which revealed occlusion of the vasculature in the right leg beginning at the common femoral artery.  Due to the patient's underlying comorbidities and poor motor function of the right foot, it was decided that an amputation would be the patient's best option.  The patient's right AKA dressing was taken down on postop day 2 without issue.  He was discharged to a skilled nursing facility on 02/14/2022.  He was set to follow-up with [place] in 4 weeks for staple removal.  Pt returns today for early follow up.  Pt states a provider at his nursing facility was concerned about his incision.  He has been developing some serosanguineous drainage, erythema, and swelling of his right AKA since Friday, 02/16/2022.  He denies any fevers or chills.  He states that the facility has been cleaning it daily. He has taken 3 separate falls out of his wheelchair since Friday, which has made his amputation site "irritated" and caused it to bleed occasionally. His most recent fall was yesterday where he hit the medial portion of his incision site. Both the patient and his sister deny any pus-like discharge, yellow or green discharge, or foul smelling discharge.   Allergies  Allergen Reactions   Isovue [Iopamidol] Hives    Pt broke out in one hive on his chest after contrast on 01/03/15.  Pt will need full premeds in the future per Dr Martinique.      Current Outpatient Medications  Medication Sig Dispense Refill   acetaminophen (TYLENOL) 500 MG tablet Take 500-1,000 mg by mouth See admin instructions. Take 2 tablets (1000 mg) by mouth in the morning oral & take 1 tablet (500 mg) by mouth  every 6 hours as needed for fever, headache, pain.     aluminum-magnesium hydroxide-simethicone (MAALOX) 741-287-86 MG/5ML SUSP Take 30 mLs by mouth every 6 (six) hours as needed (heartburn/indigestion).     aspirin EC 81 MG tablet Take 1 tablet (81 mg total) by mouth daily. 150 tablet 11   atenolol (TENORMIN) 50 MG tablet Take 1 tablet (50 mg total) by mouth daily. 30 tablet 2   busPIRone (BUSPAR) 10 MG tablet Take 10 mg by mouth 3 (three) times daily.     cholecalciferol (GNP VITAMIN D3) 10 MCG (400 UNIT) TABS tablet Take 800 Units by mouth daily.     clopidogrel (PLAVIX) 75 MG tablet Take 1 tablet (75 mg total) by mouth daily. 30 tablet 11   guaiFENesin (ROBITUSSIN) 100 MG/5ML liquid Take 10 mLs by mouth every 6 (six) hours as needed for cough.     loperamide (IMODIUM) 2 MG capsule Take 2 mg by mouth as needed for diarrhea or loose stools. Do not exceed '16mg'$  in 24 hours     magnesium hydroxide (MILK OF MAGNESIA) 400 MG/5ML suspension Take 30 mLs by mouth at bedtime as needed for mild constipation.     mineral oil-hydrophilic petrolatum (AQUAPHOR) ointment Apply 1 application topically daily. Apply topically to arms, legs and feet daily for dry skin     mirtazapine (REMERON) 15 MG tablet Take 15 mg by mouth at bedtime.     Multiple Vitamins-Minerals (  CERTAVITE/ANTIOXIDANTS) TABS Take 1 tablet by mouth in the morning.     neomycin-bacitracin-polymyxin (NEOSPORIN) ointment Apply 1 application  topically See admin instructions. Apply once daily in the morning to right leg & as needed for skin tears or abrasions.     Nutritional Supplement LIQD Take 1 Bottle by mouth 3 (three) times daily. Mighty Shake     oxyCODONE-acetaminophen (PERCOCET/ROXICET) 5-325 MG tablet Take 1-2 tablets by mouth every 6 (six) hours as needed for moderate pain. 20 tablet 0   phenytoin (DILANTIN) 100 MG ER capsule Take 200 mg by mouth at bedtime. Along with 60 mg dose     phenytoin (DILANTIN) 30 MG ER capsule Take 60 mg by  mouth at bedtime. Along with 200 mg dose     pravastatin (PRAVACHOL) 40 MG tablet Take 40 mg by mouth at bedtime.     No current facility-administered medications for this visit.     ROS:  See HPI  Physical Exam:   Incision:  Lateral portion of the R AKA incision healing okay with some eschar. Medial portion of incision with swelling and erythema, some bloody discharge. All staples in place. Neuro: intact     Assessment/Plan:  This is a 66 y.o. male who is s/p: R AKA on 8/16 by Dr.Brabham  -Patient has taken a couple of falls since discharge and caused trauma to the R AKA incision site. Most recent fall was yesterday -Patient has had some serosanguinous discharge since his falls, however no foul smelling discharge or pus -I have taken out 4 staples along the medial portion of the amputation site and saw healthy tissue with no purulence. Some skin separation with staple removal that will have to heal from the inside out.  -Low concern for infection at this time, but we would like to monitor the wound as it continues to fully heal. I have started wet to dry dressings for the incision today -I have recommended to the patient's facility that they change the wet-to-dry dressings 1-2 times daily and start the patient on a 10 day course of Bactrim BID  -He will follow up with [place] in 1 week for a wound check    Vicente Serene, PA-C [facility] [phone]   Clinic MD:  Scot Dock

## 2022-03-01 ENCOUNTER — Ambulatory Visit (INDEPENDENT_AMBULATORY_CARE_PROVIDER_SITE_OTHER): Payer: Medicaid Other | Admitting: Physician Assistant

## 2022-03-01 VITALS — BP 152/75 | HR 78 | Temp 98.4°F | Ht 66.0 in

## 2022-03-01 DIAGNOSIS — I739 Peripheral vascular disease, unspecified: Secondary | ICD-10-CM

## 2022-03-01 NOTE — Progress Notes (Signed)
POST OPERATIVE OFFICE NOTE    CC:  F/u for surgery  HPI:  This is a 66 y.o. male who is s/p  right AKA after endovascular attempts at limb salvage for non healing wound.  He developed rest pain with no revascularization options.    Pt returns today for follow up.  Pt states he has not fallen anymore.  They are doing daily dressing changes and he has completed the oral antibiotics given to him.  He denise rast pain, purulent drainage or fever and chills.    Allergies  Allergen Reactions   Isovue [Iopamidol] Hives    Pt broke out in one hive on his chest after contrast on 01/03/15.  Pt will need full premeds in the future per Dr Martinique.      Current Outpatient Medications  Medication Sig Dispense Refill   acetaminophen (TYLENOL) 500 MG tablet Take 500-1,000 mg by mouth See admin instructions. Take 2 tablets (1000 mg) by mouth in the morning oral & take 1 tablet (500 mg) by mouth every 6 hours as needed for fever, headache, pain.     aluminum-magnesium hydroxide-simethicone (MAALOX) 650-354-65 MG/5ML SUSP Take 30 mLs by mouth every 6 (six) hours as needed (heartburn/indigestion).     aspirin EC 81 MG tablet Take 1 tablet (81 mg total) by mouth daily. 150 tablet 11   atenolol (TENORMIN) 50 MG tablet Take 1 tablet (50 mg total) by mouth daily. 30 tablet 2   busPIRone (BUSPAR) 10 MG tablet Take 10 mg by mouth 3 (three) times daily.     cholecalciferol (GNP VITAMIN D3) 10 MCG (400 UNIT) TABS tablet Take 800 Units by mouth daily.     clopidogrel (PLAVIX) 75 MG tablet Take 1 tablet (75 mg total) by mouth daily. 30 tablet 11   guaiFENesin (ROBITUSSIN) 100 MG/5ML liquid Take 10 mLs by mouth every 6 (six) hours as needed for cough.     loperamide (IMODIUM) 2 MG capsule Take 2 mg by mouth as needed for diarrhea or loose stools. Do not exceed '16mg'$  in 24 hours     magnesium hydroxide (MILK OF MAGNESIA) 400 MG/5ML suspension Take 30 mLs by mouth at bedtime as needed for mild constipation.     mineral  oil-hydrophilic petrolatum (AQUAPHOR) ointment Apply 1 application topically daily. Apply topically to arms, legs and feet daily for dry skin     mirtazapine (REMERON) 15 MG tablet Take 15 mg by mouth at bedtime.     Multiple Vitamins-Minerals (CERTAVITE/ANTIOXIDANTS) TABS Take 1 tablet by mouth in the morning.     neomycin-bacitracin-polymyxin (NEOSPORIN) ointment Apply 1 application  topically See admin instructions. Apply once daily in the morning to right leg & as needed for skin tears or abrasions.     Nutritional Supplement LIQD Take 1 Bottle by mouth 3 (three) times daily. Mighty Shake     oxyCODONE-acetaminophen (PERCOCET/ROXICET) 5-325 MG tablet Take 1-2 tablets by mouth every 6 (six) hours as needed for moderate pain. 20 tablet 0   phenytoin (DILANTIN) 100 MG ER capsule Take 200 mg by mouth at bedtime. Along with 60 mg dose     phenytoin (DILANTIN) 30 MG ER capsule Take 60 mg by mouth at bedtime. Along with 200 mg dose     pravastatin (PRAVACHOL) 40 MG tablet Take 40 mg by mouth at bedtime.     No current facility-administered medications for this visit.     ROS:  See HPI  Physical Exam:     Stump appears viable Decreased  erythema, no fluctuance or drainage actively General no acute distress    Assessment/Plan:  This is a 66 y.o. male who is s/p:right AKA   The stumps is healing well and he has not had anymore falls.  The SNF facility is performing wet to dry dressings daily.  Plan for return in 2 weeks for staple removal and incision check.   He or his family members will call if the have problems or concerns.    Roxy Horseman PA-C [facility] [phone]   Clinic MD:  Scot Dock

## 2022-03-12 ENCOUNTER — Ambulatory Visit (INDEPENDENT_AMBULATORY_CARE_PROVIDER_SITE_OTHER): Payer: Medicaid Other | Admitting: Physician Assistant

## 2022-03-12 VITALS — BP 148/76 | HR 85 | Temp 98.7°F | Resp 20

## 2022-03-12 DIAGNOSIS — I739 Peripheral vascular disease, unspecified: Secondary | ICD-10-CM

## 2022-03-12 NOTE — Progress Notes (Signed)
POST OPERATIVE OFFICE NOTE    CC:  F/u for surgery  HPI:  This is a 66 y.o. male who is s/p right above-the-knee amputation by Dr. Trula Slade on 02/07/2022.  He was seen in the office postoperatively to evaluate right AKA after sustaining a fall.  He had some medial incision dehiscence that has been treated with wet-to-dry.  The patient's sister accompanies him to his appointment.  She states that the wound has been improving.  Patient continues to reside at [facility] nursing facility.  He has had some left foot pain however is unable to describe this today.  Prior to his above-the-knee amputation he underwent aortogram with bilateral lower extremity runoff which demonstrated left SFA occlusion as well as occlusion of all tibial vessels on the left leg.  The patient's sister is aware that he will require an above-the-knee amputation on the left if he were to develop severe rest pain or tissue loss.  Allergies  Allergen Reactions   Isovue [Iopamidol] Hives    Pt broke out in one hive on his chest after contrast on 01/03/15.  Pt will need full premeds in the future per Dr Martinique.      Current Outpatient Medications  Medication Sig Dispense Refill   acetaminophen (TYLENOL) 500 MG tablet Take 500-1,000 mg by mouth See admin instructions. Take 2 tablets (1000 mg) by mouth in the morning oral & take 1 tablet (500 mg) by mouth every 6 hours as needed for fever, headache, pain.     aluminum-magnesium hydroxide-simethicone (MAALOX) 408-144-81 MG/5ML SUSP Take 30 mLs by mouth every 6 (six) hours as needed (heartburn/indigestion).     aspirin EC 81 MG tablet Take 1 tablet (81 mg total) by mouth daily. 150 tablet 11   atenolol (TENORMIN) 50 MG tablet Take 1 tablet (50 mg total) by mouth daily. 30 tablet 2   busPIRone (BUSPAR) 10 MG tablet Take 10 mg by mouth 3 (three) times daily.     cholecalciferol (GNP VITAMIN D3) 10 MCG (400 UNIT) TABS tablet Take 800 Units by mouth daily.     clopidogrel (PLAVIX) 75 MG  tablet Take 1 tablet (75 mg total) by mouth daily. 30 tablet 11   guaiFENesin (ROBITUSSIN) 100 MG/5ML liquid Take 10 mLs by mouth every 6 (six) hours as needed for cough.     loperamide (IMODIUM) 2 MG capsule Take 2 mg by mouth as needed for diarrhea or loose stools. Do not exceed '16mg'$  in 24 hours     magnesium hydroxide (MILK OF MAGNESIA) 400 MG/5ML suspension Take 30 mLs by mouth at bedtime as needed for mild constipation.     mineral oil-hydrophilic petrolatum (AQUAPHOR) ointment Apply 1 application topically daily. Apply topically to arms, legs and feet daily for dry skin     mirtazapine (REMERON) 15 MG tablet Take 15 mg by mouth at bedtime.     Multiple Vitamins-Minerals (CERTAVITE/ANTIOXIDANTS) TABS Take 1 tablet by mouth in the morning.     neomycin-bacitracin-polymyxin (NEOSPORIN) ointment Apply 1 application  topically See admin instructions. Apply once daily in the morning to right leg & as needed for skin tears or abrasions.     Nutritional Supplement LIQD Take 1 Bottle by mouth 3 (three) times daily. Mighty Shake     oxyCODONE-acetaminophen (PERCOCET/ROXICET) 5-325 MG tablet Take 1-2 tablets by mouth every 6 (six) hours as needed for moderate pain. 20 tablet 0   phenytoin (DILANTIN) 100 MG ER capsule Take 200 mg by mouth at bedtime. Along with 60 mg dose  phenytoin (DILANTIN) 30 MG ER capsule Take 60 mg by mouth at bedtime. Along with 200 mg dose     pravastatin (PRAVACHOL) 40 MG tablet Take 40 mg by mouth at bedtime.     No current facility-administered medications for this visit.     ROS:  See HPI  Physical Exam:  Vitals:   03/12/22 1420  BP: (!) 148/76  Pulse: 85  Resp: 20  Temp: 98.7 F (37.1 C)  TempSrc: Temporal  SpO2: 97%    Incision: Right above-the-knee amputation incision healing well with small area of dehiscence of medial incision which is shallow without purulence or other drainage Extremities: Left foot is warm to touch without tissue  loss  Assessment/Plan:  This is a 66 y.o. male who is s/p: Right above-the-knee amputation  -All staples were removed from the right above-the-knee amputation incision.  Small wet 2 x 2 was applied to the medial portion of the incision and the entire stump was wrapped with Kerlix.  He will return in 1 month to make sure that the right AKA incision has completely healed.  I also evaluated his left foot today due to a vague complaint of generalized pain.  Based on angiography he has a left SFA occlusion as well as occlusion of all 3 tibial vessels.  He does not have any options for revascularization of the left lower extremity.  The patient and his sister are aware that he would require another above-the-knee amputation on the left if he were to develop intolerable rest pain and/or tissue loss of the left foot.  We will see him back in 1 month to reevaluate right AKA as mentioned above.   [name], PA-C [facility] [phone]  Clinic MD:  Trula Slade

## 2022-04-02 ENCOUNTER — Encounter: Payer: Self-pay | Admitting: Podiatry

## 2022-04-02 ENCOUNTER — Ambulatory Visit (INDEPENDENT_AMBULATORY_CARE_PROVIDER_SITE_OTHER): Payer: Medicaid Other | Admitting: Podiatry

## 2022-04-02 DIAGNOSIS — Z89611 Acquired absence of right leg above knee: Secondary | ICD-10-CM | POA: Diagnosis not present

## 2022-04-02 DIAGNOSIS — L89529 Pressure ulcer of left ankle, unspecified stage: Secondary | ICD-10-CM

## 2022-04-02 DIAGNOSIS — I739 Peripheral vascular disease, unspecified: Secondary | ICD-10-CM | POA: Diagnosis not present

## 2022-04-02 DIAGNOSIS — M79676 Pain in unspecified toe(s): Secondary | ICD-10-CM

## 2022-04-02 DIAGNOSIS — L89626 Pressure-induced deep tissue damage of left heel: Secondary | ICD-10-CM | POA: Diagnosis not present

## 2022-04-02 DIAGNOSIS — L84 Corns and callosities: Secondary | ICD-10-CM | POA: Diagnosis not present

## 2022-04-02 DIAGNOSIS — B351 Tinea unguium: Secondary | ICD-10-CM | POA: Diagnosis not present

## 2022-04-02 NOTE — Patient Instructions (Signed)
To DON at [facility] and [place]:  Recommend heel protector for left pressure injury of heel and left lateral malleolus. Patient to follow up with Dr. Stephens Shire office (Vascular Surgery) for follow up.           Pressure Injury  A pressure injury is damage to the skin and underlying tissue that results from pressure being applied to an area of the body. It often affects people who must spend a long time in a bed or chair because of a medical condition. Pressure injuries usually occur: Over bony parts of the body, such as the tailbone, shoulders, elbows, hips, heels, spine, ankles, and back of the head. Under medical devices that make contact with the body, such as respiratory equipment, stockings, tubes, and splints. Pressure injuries start as reddened areas on the skin and can lead to pain and an open wound. What are the causes? This condition is caused by frequent or constant pressure to an area of the body. Decreased blood flow to the skin can eventually cause the skin tissue to die and break down, causing a wound. What increases the risk? You are more likely to develop this condition if you: Are in the hospital or an extended care facility. Are bedridden or in a wheelchair. Have an injury or disease that keeps you from: Moving normally. Feeling pain or pressure. Have a condition that: Makes you sleepy or less alert. Causes poor blood flow. Need to wear a medical device. Have poor control of your bladder or bowel functions (incontinence). Have poor nutrition (malnutrition). If you are at risk for pressure injuries, your health care provider may recommend certain types of mattresses, mattress covers, pillows, cushions, or boots to help prevent them. These may include products filled with air, foam, gel, or sand. What are the signs or symptoms? Symptoms of this condition depend on the severity of the injury. Symptoms may include: Red or dark areas of the  skin. Pain, warmth, or a change of skin texture. Blisters. An open wound. How is this diagnosed? This condition is diagnosed with a medical history and physical exam. You may also have tests, such as: Blood tests. Imaging tests. Blood flow tests. Your pressure injury will be staged based on its severity. Staging is based on: The depth of the tissue injury, including whether there is exposure of muscle, bone, or tendon. The cause of the pressure injury. How is this treated? This condition may be treated by: Relieving or redistributing pressure on your skin. This includes: Frequently changing your position. Avoiding positions that caused the wound or that can make the wound worse. Using specific bed mattresses, chair cushions, or protective boots. Moving medical devices from an area of pressure, or placing padding between the skin and the device. Using foams, creams, or powders to prevent rubbing (friction) on the skin. Keeping your skin clean and dry. This may include using a skin cleanser or skin barrier as told by your health care provider. Cleaning your injury and removing any dead tissue from the wound (debridement). Placing a bandage (dressing) over your injury. Using medicines for pain or to prevent or treat infection. Surgery may be needed if other treatments are not working or if your injury is very deep. Follow these instructions at home: Wound care Follow instructions from your health care provider about how to take care of your wound. Make sure you: Wash your hands with soap and water before and after you change your bandage (dressing). If soap and water are  not available, use hand sanitizer. Change your dressing as told by your health care provider.  Check your wound every day for signs of infection. Have a caregiver do this for you if you are not able. Check for: Redness, swelling, or increased pain. More fluid or blood. Warmth. Pus or a bad smell. Skin care Keep your  skin clean and dry. Gently pat your skin dry. Do not rub or massage your skin. You or a caregiver should check your skin every day for any changes in color or any new blisters or sores (ulcers). Medicines Take over-the-counter and prescription medicines only as told by your health care provider. If you were prescribed an antibiotic medicine, take or apply it as told by your health care provider. Do not stop using the antibiotic even if your condition improves. Reducing and redistributing pressure Do not lie or sit in one position for a long time. Move or change position every 1-2 hours, or as told by your health care provider. Use pillows or cushions to reduce pressure. Ask your health care provider to recommend cushions or pads for you. General instructions  Eat a healthy diet that includes lots of protein. Drink enough fluid to keep your urine pale yellow. Be as active as you can every day. Ask your health care provider to suggest safe exercises or activities. Do not abuse drugs or alcohol. Do not use any products that contain nicotine or tobacco, such as cigarettes, e-cigarettes, and chewing tobacco. If you need help quitting, ask your health care provider. Keep all follow-up visits as told by your health care provider. This is important. Contact a health care provider if: You have: A fever or chills. Pain that is not helped by medicine. Any changes in skin color. New blisters or sores. Pus or a bad smell coming from your wound. Redness, swelling, or pain around your wound. More fluid or blood coming from your wound. Your wound does not improve after 1-2 weeks of treatment. Summary A pressure injury is damage to the skin and underlying tissue that results from pressure being applied to an area of the body. Do not lie or sit in one position for a long time. Your health care provider may advise you to move or change position every 1-2 hours. Follow instructions from your health care  provider about how to take care of your wound. Keep all follow-up visits as told by your health care provider. This is important. This information is not intended to replace advice given to you by your health care provider. Make sure you discuss any questions you have with your health care provider. Document Revised: 04/06/2021 Document Reviewed: 04/06/2021 Elsevier Patient Education  [place].
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
## 2022-04-03 ENCOUNTER — Ambulatory Visit (INDEPENDENT_AMBULATORY_CARE_PROVIDER_SITE_OTHER): Payer: Medicaid Other | Admitting: Physician Assistant

## 2022-04-03 VITALS — BP 158/66 | HR 74 | Temp 97.0°F | Resp 20 | Ht 66.0 in

## 2022-04-03 DIAGNOSIS — I739 Peripheral vascular disease, unspecified: Secondary | ICD-10-CM
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
## 2022-04-04 ENCOUNTER — Other Ambulatory Visit: Payer: Self-pay | Admitting: *Deleted

## 2022-04-04 DIAGNOSIS — I739 Peripheral vascular disease, unspecified: Secondary | ICD-10-CM

## 2022-04-09 ENCOUNTER — Ambulatory Visit: Payer: Medicaid Other

## 2022-04-10 ENCOUNTER — Other Ambulatory Visit: Payer: Self-pay

## 2022-04-10 ENCOUNTER — Encounter (HOSPITAL_COMMUNITY): Payer: Self-pay | Admitting: Surgery

## 2022-04-10 ENCOUNTER — Encounter (HOSPITAL_COMMUNITY)
Admission: RE | Disposition: A | Discharge status: Home / Self Care | Payer: Self-pay | Source: Home / Self Care | Attending: Surgery

## 2022-04-10 ENCOUNTER — Ambulatory Visit (HOSPITAL_COMMUNITY)
Admission: RE | Admit: 2022-04-10 | Discharge: 2022-04-10 | Disposition: A | Discharge status: Home / Self Care | Payer: Medicaid Other | Attending: Surgery | Admitting: Surgery

## 2022-04-10 DIAGNOSIS — I70262 Atherosclerosis of native arteries of extremities with gangrene, left leg: Secondary | ICD-10-CM | POA: Diagnosis present

## 2022-04-10 DIAGNOSIS — L97429 Non-pressure chronic ulcer of left heel and midfoot with unspecified severity: Secondary | ICD-10-CM | POA: Diagnosis not present

## 2022-04-10 DIAGNOSIS — I739 Peripheral vascular disease, unspecified: Secondary | ICD-10-CM

## 2022-04-10 DIAGNOSIS — Z89611 Acquired absence of right leg above knee: Secondary | ICD-10-CM | POA: Diagnosis not present

## 2022-04-10 DIAGNOSIS — I70245 Atherosclerosis of native arteries of left leg with ulceration of other part of foot: Secondary | ICD-10-CM

## 2022-04-10 HISTORY — PX: ABDOMINAL AORTOGRAM W/LOWER EXTREMITY: CATH118223

## 2022-04-10 LAB — POCT I-STAT, CHEM 8
BUN: 25 mg/dL — ABNORMAL HIGH (ref 8–23)
Calcium, Ion: 1.31 mmol/L (ref 1.15–1.40)
Chloride: 101 mmol/L (ref 98–111)
Creatinine, Ser: 0.8 mg/dL (ref 0.61–1.24)
Glucose, Bld: 97 mg/dL (ref 70–99)
HCT: 42 % (ref 39.0–52.0)
Hemoglobin: 14.3 g/dL (ref 13.0–17.0)
Potassium: 4 mmol/L (ref 3.5–5.1)
Sodium: 140 mmol/L (ref 135–145)
TCO2: 29 mmol/L (ref 22–32)

## 2022-04-10 SURGERY — ABDOMINAL AORTOGRAM W/LOWER EXTREMITY
Anesthesia: LOCAL

## 2022-04-10 MED ORDER — ONDANSETRON HCL 4 MG/2ML IJ SOLN: 4.0000 mg | Freq: Four times a day (QID) | INTRAMUSCULAR | Status: DC | PRN

## 2022-04-10 MED ORDER — ACETAMINOPHEN 325 MG PO TABS: 650.0000 mg | ORAL_TABLET | ORAL | Status: DC | PRN

## 2022-04-10 MED ORDER — MORPHINE SULFATE (PF) 2 MG/ML IV SOLN: 2.0000 mg | INTRAVENOUS | Status: DC | PRN

## 2022-04-10 MED ORDER — FENTANYL CITRATE (PF) 100 MCG/2ML IJ SOLN
INTRAMUSCULAR | Status: AC
  Filled 2022-04-10: qty 2

## 2022-04-10 MED ORDER — LABETALOL HCL 5 MG/ML IV SOLN
10.0000 mg | INTRAVENOUS | Status: DC | PRN
  Administered 2022-04-10: 10 mg via INTRAVENOUS
  Filled 2022-04-10: qty 4

## 2022-04-10 MED ORDER — HEPARIN (PORCINE) IN NACL 1000-0.9 UT/500ML-% IV SOLN
INTRAVENOUS | Status: AC
  Filled 2022-04-10: qty 1000

## 2022-04-10 MED ORDER — OXYCODONE HCL 5 MG PO TABS: 5.0000 mg | ORAL_TABLET | ORAL | Status: DC | PRN

## 2022-04-10 MED ORDER — METHYLPREDNISOLONE SODIUM SUCC 125 MG IJ SOLR
125.0000 mg | INTRAMUSCULAR | Status: AC
  Administered 2022-04-10: 125 mg via INTRAVENOUS
  Filled 2022-04-10: qty 2

## 2022-04-10 MED ORDER — HYDRALAZINE HCL 20 MG/ML IJ SOLN: 5.0000 mg | INTRAMUSCULAR | Status: DC | PRN

## 2022-04-10 MED ORDER — SODIUM CHLORIDE 0.9 % IV SOLN: 250.0000 mL | INTRAVENOUS | Status: DC | PRN

## 2022-04-10 MED ORDER — SODIUM CHLORIDE 0.9 % IV SOLN: INTRAVENOUS | Status: DC

## 2022-04-10 MED ORDER — LIDOCAINE HCL (PF) 1 % IJ SOLN
INTRAMUSCULAR | Status: AC
  Filled 2022-04-10: qty 30

## 2022-04-10 MED ORDER — SODIUM CHLORIDE 0.9% FLUSH: 3.0000 mL | Freq: Two times a day (BID) | INTRAVENOUS | Status: DC

## 2022-04-10 MED ORDER — SODIUM CHLORIDE 0.9% FLUSH: 3.0000 mL | INTRAVENOUS | Status: DC | PRN

## 2022-04-10 MED ORDER — IODIXANOL 320 MG/ML IV SOLN
INTRAVENOUS | Status: DC | PRN
  Administered 2022-04-10: 58 mL via INTRA_ARTERIAL

## 2022-04-10 MED ORDER — HEPARIN (PORCINE) IN NACL 1000-0.9 UT/500ML-% IV SOLN
INTRAVENOUS | Status: DC | PRN
  Administered 2022-04-10 (×2): 500 mL

## 2022-04-10 MED ORDER — DIPHENHYDRAMINE HCL 50 MG/ML IJ SOLN
25.0000 mg | INTRAMUSCULAR | Status: AC
  Administered 2022-04-10: 25 mg via INTRAVENOUS
  Filled 2022-04-10: qty 1

## 2022-04-10 MED ORDER — MIDAZOLAM HCL 2 MG/2ML IJ SOLN
INTRAMUSCULAR | Status: DC | PRN
  Administered 2022-04-10: 1 mg via INTRAVENOUS

## 2022-04-10 MED ORDER — MIDAZOLAM HCL 2 MG/2ML IJ SOLN
INTRAMUSCULAR | Status: AC
  Filled 2022-04-10: qty 2

## 2022-04-10 MED ORDER — FENTANYL CITRATE (PF) 100 MCG/2ML IJ SOLN
INTRAMUSCULAR | Status: DC | PRN
  Administered 2022-04-10: 25 ug via INTRAVENOUS

## 2022-04-10 MED ORDER — ATENOLOL 50 MG PO TABS
50.0000 mg | ORAL_TABLET | Freq: Once | ORAL | Status: AC
  Administered 2022-04-10: 50 mg via ORAL
  Filled 2022-04-10 (×2): qty 1

## 2022-04-10 SURGICAL SUPPLY — 10 items
CATH OMNI FLUSH 5F 65CM (CATHETERS) IMPLANT
DEVICE VASC CLSR CELT ART 5 (Vascular Products) IMPLANT
KIT MICROPUNCTURE NIT STIFF (SHEATH) IMPLANT
KIT PV (KITS) ×1 IMPLANT
SHEATH PINNACLE 5F 10CM (SHEATH) IMPLANT
SYR MEDRAD MARK 7 150ML (SYRINGE) ×1 IMPLANT
TRANSDUCER W/STOPCOCK (MISCELLANEOUS) ×1 IMPLANT
TRAY PV CATH (CUSTOM PROCEDURE TRAY) ×1 IMPLANT
WIRE BENTSON .035X145CM (WIRE) IMPLANT
WIRE MICROINTRODUCER 60CM (WIRE) IMPLANT

## 2022-04-10 NOTE — Op Note (Signed)
    Patient name: [name] MRN: [id number] DOB: [birth year] Sex: male  04/10/2022 Pre-operative Diagnosis: left heel ulcer Post-operative diagnosis:  Same Surgeon:  [name] Procedure Performed:  1.  Ultrasound-guided access, left femoral artery  2.  Abdominal aortogram  3.  Left lower extremity runoff  4.  Conscious sedation, 25 minutes  5.  Closure device, Celt   Indications: This is a 66 year old gentleman with left heel ulcer who comes in for angiographic evaluation  Procedure:  The patient was identified in the holding area and taken to room 8.  The patient was then placed supine on the table and prepped and draped in the usual sterile fashion.  A time out was called.  Conscious sedation was administered with the use of IV fentanyl and Versed under continuous physician and nurse monitoring.  Heart rate, blood pressure, and oxygen saturation were continuously monitored.  Total sedation time was 25 minutes.  Ultrasound was used to evaluate the left common femoral artery.  It was patent .  A digital ultrasound image was acquired.  A micropuncture needle was used to access the left common femoral artery under ultrasound guidance.  An 018 wire was advanced without resistance and a micropuncture sheath was placed.  The 018 wire was removed and a benson wire was placed.  The micropuncture sheath was exchanged for a 5 french sheath.  An omniflush catheter was advanced over the wire to the level of L-1.  An abdominal angiogram was obtained.  Next, left leg runoff was obtained through the sheath Findings:   Aortogram: No significant aortic stenosis was identified.  The left common and external iliac arteries are widely patent.  The right iliac system is occluded  Right Lower Extremity: Not evaluated  Left Lower Extremity: The left common femoral artery is widely patent.  There is a flush occlusion of the superficial femoral artery.  There are multiple profunda branches in the thigh.  A  small popliteal island reconstitutes above the knee and then occludes.  There is reconstitution of the anterior tibial artery in the lower leg which crosses the ankle  Intervention: The groin was closed with a Celt  Impression:  #1  Chronic right iliac occlusion  #2  Left superficial femoral artery occlusion with reconstitution of the anterior tibial artery below the calf  #3  Given the patient's comorbidities, left leg contracture, and right leg amputation, I would not recommend revascularization   V. [name], M.D., [facility] [place] Office: [phone] Pager:  [phone]

## 2022-04-10 NOTE — Progress Notes (Signed)
Writer called twice to [employer] in [place] where patient resides. The phone only rang and went to a voicemail. Writer printed 2 copies of discharge paper work and went over instructions with patient and sister. Sister stated she was following his transportation service to the facility and will give the discharge paperwork to the facility. Patient discharge post ambulation and was discharged per order.

## 2022-04-10 NOTE — Interval H&P Note (Signed)
History and Physical Interval Note:  04/10/2022 7:39 AM  [name]  has presented today for surgery, with the diagnosis of pvd.  The various methods of treatment have been discussed with the patient and family. After consideration of risks, benefits and other options for treatment, the patient has consented to  Procedure(s): ABDOMINAL AORTOGRAM W/LOWER EXTREMITY (N/A) as a surgical intervention.  The patient's history has been reviewed, patient examined, no change in status, stable for surgery.  I have reviewed the patient's chart and labs.  Questions were answered to the patient's satisfaction.     [name]

## 2022-04-13 MED FILL — Lidocaine HCl Local Preservative Free (PF) Inj 1%: INTRAMUSCULAR | Qty: 30 | Status: AC

## 2022-04-17 ENCOUNTER — Telehealth: Payer: Self-pay | Admitting: Pulmonary Disease

## 2022-04-17 NOTE — Telephone Encounter (Signed)
[facility] are needing to know if you are still needing the CT scan for Jan 2024.  Please advise sir

## 2022-04-18 NOTE — Telephone Encounter (Signed)
Noted  

## 2022-04-24 ENCOUNTER — Telehealth: Payer: Self-pay

## 2022-04-24 NOTE — Telephone Encounter (Signed)
Pt's sister, [name], called stating that the pt had a new wound.  Reviewed pt's chart, returned call for clarification, two identifiers used. She stated that the new wound is on his L buttock, but he's had several falls and it appears more like a bruise. It had a bump on it that opened up. Informed her that it may be a deep tissue injury from the falls that may start to look worse before improving. The wound care staff is aware and treating. Advised her to speak to the provider at the facility, [facility] to evaluate for any other treatment options. Confirmed understanding.

## 2022-04-26 ENCOUNTER — Telehealth: Payer: Self-pay

## 2022-04-26 NOTE — Telephone Encounter (Signed)
Pt's sister, [name], walked into clinic, filled out triage form, and wanted to discuss a new wound on the foot. She had taken pictures on her phone and wanted to show them to staff.  Walked to front and spoke directly with [place]. She showed me the pictures she had taken of pt's bruised buttock which is worse d/t starting off as a deep tissue wound. He fell again and there is a large open wound on his L great toe. It has drastically worsened since his fall on Tues and is now draining. The entire foot is swollen and red. Due to the risk of infection and the poor blood flow to the foot, appt scheduled for next day. Confirmed understanding.

## 2022-04-27 ENCOUNTER — Ambulatory Visit (INDEPENDENT_AMBULATORY_CARE_PROVIDER_SITE_OTHER): Payer: Medicaid Other | Admitting: Physician Assistant

## 2022-04-27 VITALS — BP 122/66 | HR 70 | Resp 20 | Ht 66.0 in

## 2022-04-27 DIAGNOSIS — I739 Peripheral vascular disease, unspecified: Secondary | ICD-10-CM | POA: Diagnosis not present
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
## 2022-05-07 ENCOUNTER — Ambulatory Visit (INDEPENDENT_AMBULATORY_CARE_PROVIDER_SITE_OTHER): Payer: Medicaid Other | Admitting: Physician Assistant

## 2022-05-07 VITALS — BP 160/76 | HR 76 | Temp 97.2°F | Resp 16

## 2022-05-07 DIAGNOSIS — I96 Gangrene, not elsewhere classified: Secondary | ICD-10-CM

## 2022-05-07 DIAGNOSIS — I739 Peripheral vascular disease, unspecified: Secondary | ICD-10-CM
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
## 2022-05-22 ENCOUNTER — Ambulatory Visit (INDEPENDENT_AMBULATORY_CARE_PROVIDER_SITE_OTHER): Payer: Medicaid Other | Admitting: Physician Assistant

## 2022-05-22 VITALS — BP 104/61 | HR 70 | Temp 97.6°F | Resp 20 | Ht 68.0 in | Wt 95.0 lb

## 2022-05-22 DIAGNOSIS — I739 Peripheral vascular disease, unspecified: Secondary | ICD-10-CM

## 2022-05-22 DIAGNOSIS — I96 Gangrene, not elsewhere classified: Secondary | ICD-10-CM

## 2022-05-22 NOTE — Progress Notes (Signed)
OFFICE NOTE    CC:  F/u for surgery  HPI:  [name] is a 66 y.o. (1955-08-25) male who presents for follow up wound check. He was seen on 04/27/22 with a new left great toe wound after a fall. He has had a known left heel ulcer with no revascularization options.  He resides at a skilled facility.  His sister is very active in his care and is here with him.    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  He is non ambulatory.  The care plan has been betadine paint, 4 x 4 and dry guaze to left GT wounds and heel wounds.      Pt returns today for follow up.  Pt denise fever and chills.  There is no drainage from his wounds.     Allergies  Allergen Reactions   Isovue [Iopamidol] Hives    Pt broke out in one hive on his chest after contrast on 01/03/15.  Pt will need full premeds in the future per Dr Martinique.      Current Outpatient Medications  Medication Sig Dispense Refill   acetaminophen (TYLENOL) 500 MG tablet Take 500-1,000 mg by mouth See admin instructions. Take 2 tablets (1000 mg) by mouth in the morning oral & take 1 tablet (500 mg) by mouth every 6 hours as needed for fever, headache, pain.     aluminum-magnesium hydroxide-simethicone (MAALOX) [phone] MG/5ML SUSP Take 30 mLs by mouth every 6 (six) hours as needed (heartburn).     aspirin EC 81 MG tablet Take 1 tablet (81 mg total) by mouth daily. 150 tablet 11   atenolol (TENORMIN) 50 MG tablet Take 1 tablet (50 mg total) by mouth daily. 30 tablet 2   busPIRone (BUSPAR) 10 MG tablet Take 10 mg by mouth 3 (three) times daily.     cholecalciferol (GNP VITAMIN D3) 10 MCG (400 UNIT) TABS tablet Take 800 Units by mouth daily.     clopidogrel (PLAVIX) 75 MG tablet Take 1 tablet (75 mg total) by mouth daily. 30 tablet 11   gabapentin (NEURONTIN) 100 MG capsule Take 100-200 mg by mouth See admin instructions. 100 mg daily, 200 mg in the evening     guaiFENesin (ROBITUSSIN) 100 MG/5ML  liquid Take 10 mLs by mouth every 6 (six) hours as needed for cough.     loperamide (IMODIUM) 2 MG capsule Take 2 mg by mouth as needed for diarrhea or loose stools. Do not exceed '16mg'$  in 24 hours     magnesium hydroxide (MILK OF MAGNESIA) 400 MG/5ML suspension Take 30 mLs by mouth at bedtime as needed for mild constipation.     mirtazapine (REMERON) 15 MG tablet Take 15 mg by mouth at bedtime.     Multiple Vitamins-Minerals (CERTAVITE/ANTIOXIDANTS) TABS Take 1 tablet by mouth in the morning.     Multiple Vitamins-Minerals (MULTIVITAMIN WITH MINERALS) tablet Take 1 tablet by mouth daily. With Iron     neomycin-bacitracin-polymyxin (NEOSPORIN) ointment Apply 1 application  topically See admin instructions. Apply once daily in the morning to right leg & as needed for skin tears or abrasions.     nicotine polacrilex (NICORETTE) 2 MG gum Take 2 mg by mouth as needed for smoking cessation. MAR says to dissolve 1 gum buccally as needed     Nutritional Supplements (BOOST VERY HIGH CALORIE PO) Take 1 Container by mouth 2 (two) times daily.     oxyCODONE-acetaminophen (PERCOCET/ROXICET)  5-325 MG tablet Take 1-2 tablets by mouth every 6 (six) hours as needed for moderate pain. (Patient taking differently: Take 1 tablet by mouth every 8 (eight) hours as needed for moderate pain.) 20 tablet 0   oxymetazoline (AFRIN) 0.05 % nasal spray Place 3 sprays into both nostrils 2 (two) times daily as needed for congestion.     phenytoin (DILANTIN INFATABS) 50 MG tablet Chew 50 mg by mouth daily.     phenytoin (DILANTIN) 100 MG ER capsule Take 100-200 mg by mouth See admin instructions. 100 mg daily, 200 mg at bedtime     promethazine (PHENERGAN) 25 MG tablet Take 25 mg by mouth every 4 (four) hours as needed for nausea or vomiting.     No current facility-administered medications for this visit.     ROS:  See HPI  Physical Exam:       Dry gangrene left heel wound.  No evidence of erythema or  drainage     Assessment/Plan:  This is a 66 y.o. male who is s/p:we are managing the wounds and doing serial pictures  No frank changes in wounds.  Dry gangrene.  He and his sister are not interested in primary amputation unless it is needed.  At this point continue current treatment as listed above.   If he develops fevers, chills or other signs of infection he will have to consider primary amputation of the left LE.Marland Kitchen His sister will call with concerns.  F/U in January for wound checks.    Roxy Horseman PA-C [facility] [phone]   Clinic MD:  Carlis Abbott

## 2022-06-14 ENCOUNTER — Other Ambulatory Visit: Payer: Self-pay

## 2022-06-14 ENCOUNTER — Inpatient Hospital Stay (HOSPITAL_COMMUNITY)
Admission: EM | Admit: 2022-06-14 | Discharge: 2022-06-22 | DRG: 239 | Disposition: A | Discharge status: Skilled Nursing Facility | Payer: Medicaid Other | Attending: Internal Medicine | Admitting: Internal Medicine

## 2022-06-14 ENCOUNTER — Emergency Department (HOSPITAL_COMMUNITY): Payer: Medicaid Other

## 2022-06-14 DIAGNOSIS — Z23 Encounter for immunization: Secondary | ICD-10-CM | POA: Diagnosis not present

## 2022-06-14 DIAGNOSIS — I10 Essential (primary) hypertension: Secondary | ICD-10-CM | POA: Diagnosis present

## 2022-06-14 DIAGNOSIS — T8789 Other complications of amputation stump: Secondary | ICD-10-CM | POA: Diagnosis present

## 2022-06-14 DIAGNOSIS — Z800 Family history of malignant neoplasm of digestive organs: Secondary | ICD-10-CM | POA: Diagnosis not present

## 2022-06-14 DIAGNOSIS — R569 Unspecified convulsions: Secondary | ICD-10-CM

## 2022-06-14 DIAGNOSIS — I714 Abdominal aortic aneurysm, without rupture, unspecified: Secondary | ICD-10-CM | POA: Diagnosis present

## 2022-06-14 DIAGNOSIS — E785 Hyperlipidemia, unspecified: Secondary | ICD-10-CM | POA: Diagnosis present

## 2022-06-14 DIAGNOSIS — Z79899 Other long term (current) drug therapy: Secondary | ICD-10-CM | POA: Diagnosis not present

## 2022-06-14 DIAGNOSIS — I96 Gangrene, not elsewhere classified: Secondary | ICD-10-CM | POA: Diagnosis present

## 2022-06-14 DIAGNOSIS — Z803 Family history of malignant neoplasm of breast: Secondary | ICD-10-CM | POA: Diagnosis not present

## 2022-06-14 DIAGNOSIS — M869 Osteomyelitis, unspecified: Secondary | ICD-10-CM | POA: Diagnosis present

## 2022-06-14 DIAGNOSIS — N3001 Acute cystitis with hematuria: Principal | ICD-10-CM

## 2022-06-14 DIAGNOSIS — F32A Depression, unspecified: Secondary | ICD-10-CM | POA: Diagnosis present

## 2022-06-14 DIAGNOSIS — L97429 Non-pressure chronic ulcer of left heel and midfoot with unspecified severity: Secondary | ICD-10-CM | POA: Insufficient documentation

## 2022-06-14 DIAGNOSIS — N390 Urinary tract infection, site not specified: Secondary | ICD-10-CM | POA: Diagnosis present

## 2022-06-14 DIAGNOSIS — E871 Hypo-osmolality and hyponatremia: Secondary | ICD-10-CM | POA: Diagnosis present

## 2022-06-14 DIAGNOSIS — G9341 Metabolic encephalopathy: Secondary | ICD-10-CM | POA: Diagnosis present

## 2022-06-14 DIAGNOSIS — D638 Anemia in other chronic diseases classified elsewhere: Secondary | ICD-10-CM | POA: Diagnosis present

## 2022-06-14 DIAGNOSIS — T8754 Necrosis of amputation stump, left lower extremity: Secondary | ICD-10-CM | POA: Diagnosis not present

## 2022-06-14 DIAGNOSIS — S069XAA Unspecified intracranial injury with loss of consciousness status unknown, initial encounter: Secondary | ICD-10-CM | POA: Diagnosis present

## 2022-06-14 DIAGNOSIS — T420X1A Poisoning by hydantoin derivatives, accidental (unintentional), initial encounter: Secondary | ICD-10-CM | POA: Diagnosis not present

## 2022-06-14 DIAGNOSIS — R410 Disorientation, unspecified: Secondary | ICD-10-CM

## 2022-06-14 DIAGNOSIS — F0390 Unspecified dementia without behavioral disturbance: Secondary | ICD-10-CM | POA: Diagnosis present

## 2022-06-14 DIAGNOSIS — R7889 Finding of other specified substances, not normally found in blood: Secondary | ICD-10-CM

## 2022-06-14 DIAGNOSIS — Z8249 Family history of ischemic heart disease and other diseases of the circulatory system: Secondary | ICD-10-CM

## 2022-06-14 DIAGNOSIS — T8781 Dehiscence of amputation stump: Secondary | ICD-10-CM | POA: Diagnosis not present

## 2022-06-14 DIAGNOSIS — Y835 Amputation of limb(s) as the cause of abnormal reaction of the patient, or of later complication, without mention of misadventure at the time of the procedure: Secondary | ICD-10-CM | POA: Diagnosis present

## 2022-06-14 DIAGNOSIS — M86172 Other acute osteomyelitis, left ankle and foot: Secondary | ICD-10-CM | POA: Diagnosis not present

## 2022-06-14 DIAGNOSIS — M24562 Contracture, left knee: Secondary | ICD-10-CM | POA: Diagnosis present

## 2022-06-14 DIAGNOSIS — G40909 Epilepsy, unspecified, not intractable, without status epilepticus: Secondary | ICD-10-CM | POA: Diagnosis present

## 2022-06-14 DIAGNOSIS — Z7902 Long term (current) use of antithrombotics/antiplatelets: Secondary | ICD-10-CM | POA: Diagnosis not present

## 2022-06-14 DIAGNOSIS — F0393 Unspecified dementia, unspecified severity, with mood disturbance: Secondary | ICD-10-CM | POA: Diagnosis present

## 2022-06-14 DIAGNOSIS — F17210 Nicotine dependence, cigarettes, uncomplicated: Secondary | ICD-10-CM | POA: Diagnosis present

## 2022-06-14 DIAGNOSIS — Z7982 Long term (current) use of aspirin: Secondary | ICD-10-CM

## 2022-06-14 DIAGNOSIS — Z87891 Personal history of nicotine dependence: Secondary | ICD-10-CM | POA: Diagnosis not present

## 2022-06-14 DIAGNOSIS — Z87820 Personal history of traumatic brain injury: Secondary | ICD-10-CM

## 2022-06-14 DIAGNOSIS — F418 Other specified anxiety disorders: Secondary | ICD-10-CM | POA: Diagnosis not present

## 2022-06-14 DIAGNOSIS — I739 Peripheral vascular disease, unspecified: Secondary | ICD-10-CM | POA: Diagnosis present

## 2022-06-14 LAB — CBC
HCT: 32.4 % — ABNORMAL LOW (ref 39.0–52.0)
Hemoglobin: 10.4 g/dL — ABNORMAL LOW (ref 13.0–17.0)
MCH: 28.2 pg (ref 26.0–34.0)
MCHC: 32.1 g/dL (ref 30.0–36.0)
MCV: 87.8 fL (ref 80.0–100.0)
Platelets: 422 10*3/uL — ABNORMAL HIGH (ref 150–400)
RBC: 3.69 MIL/uL — ABNORMAL LOW (ref 4.22–5.81)
RDW: 12.9 % (ref 11.5–15.5)
WBC: 13.1 10*3/uL — ABNORMAL HIGH (ref 4.0–10.5)
nRBC: 0 % (ref 0.0–0.2)

## 2022-06-14 LAB — CBC WITH DIFFERENTIAL/PLATELET
Abs Immature Granulocytes: 0.06 10*3/uL (ref 0.00–0.07)
Basophils Absolute: 0 10*3/uL (ref 0.0–0.1)
Basophils Relative: 0 %
Eosinophils Absolute: 0.2 10*3/uL (ref 0.0–0.5)
Eosinophils Relative: 2 %
HCT: 34.4 % — ABNORMAL LOW (ref 39.0–52.0)
Hemoglobin: 10.9 g/dL — ABNORMAL LOW (ref 13.0–17.0)
Immature Granulocytes: 1 %
Lymphocytes Relative: 21 %
Lymphs Abs: 2.2 10*3/uL (ref 0.7–4.0)
MCH: 28.2 pg (ref 26.0–34.0)
MCHC: 31.7 g/dL (ref 30.0–36.0)
MCV: 88.9 fL (ref 80.0–100.0)
Monocytes Absolute: 1.2 10*3/uL — ABNORMAL HIGH (ref 0.1–1.0)
Monocytes Relative: 11 %
Neutro Abs: 6.8 10*3/uL (ref 1.7–7.7)
Neutrophils Relative %: 65 %
Platelets: 436 10*3/uL — ABNORMAL HIGH (ref 150–400)
RBC: 3.87 MIL/uL — ABNORMAL LOW (ref 4.22–5.81)
RDW: 13.1 % (ref 11.5–15.5)
WBC: 10.5 10*3/uL (ref 4.0–10.5)
nRBC: 0 % (ref 0.0–0.2)

## 2022-06-14 LAB — URINALYSIS, ROUTINE W REFLEX MICROSCOPIC
Bilirubin Urine: NEGATIVE
Glucose, UA: NEGATIVE mg/dL
Ketones, ur: 5 mg/dL — AB
Nitrite: NEGATIVE
Protein, ur: 100 mg/dL — AB
Specific Gravity, Urine: 1.019 (ref 1.005–1.030)
WBC, UA: >50 WBC/hpf — ABNORMAL HIGH (ref 0–5)
pH: 5 (ref 5.0–8.0)

## 2022-06-14 LAB — RAPID URINE DRUG SCREEN, HOSP PERFORMED
Amphetamines: NOT DETECTED
Barbiturates: POSITIVE — AB
Benzodiazepines: NOT DETECTED
Cocaine: NOT DETECTED
Opiates: NOT DETECTED
Tetrahydrocannabinol: NOT DETECTED

## 2022-06-14 LAB — LACTIC ACID, PLASMA
Lactic Acid, Venous: 2.4 mmol/L (ref 0.5–1.9)
Lactic Acid, Venous: 2 mmol/L (ref 0.5–1.9)
Lactic Acid, Venous: 1 mmol/L (ref 0.5–1.9)

## 2022-06-14 LAB — BLOOD GAS, VENOUS
Acid-Base Excess: 6.1 mmol/L — ABNORMAL HIGH (ref 0.0–2.0)
Bicarbonate: 29.6 mmol/L — ABNORMAL HIGH (ref 20.0–28.0)
O2 Saturation: 94.7 %
Patient temperature: 37
pCO2, Ven: 38 mmHg — ABNORMAL LOW (ref 44–60)
pH, Ven: 7.5 — ABNORMAL HIGH (ref 7.25–7.43)
pO2, Ven: 63 mmHg — ABNORMAL HIGH (ref 32–45)

## 2022-06-14 LAB — COMPREHENSIVE METABOLIC PANEL
ALT: 23 U/L (ref 0–44)
AST: 32 U/L (ref 15–41)
Albumin: 2.8 g/dL — ABNORMAL LOW (ref 3.5–5.0)
Alkaline Phosphatase: 91 U/L (ref 38–126)
Anion gap: 9 (ref 5–15)
BUN: 24 mg/dL — ABNORMAL HIGH (ref 8–23)
CO2: 29 mmol/L (ref 22–32)
Calcium: 8.9 mg/dL (ref 8.9–10.3)
Chloride: 97 mmol/L — ABNORMAL LOW (ref 98–111)
Creatinine, Ser: 0.9 mg/dL (ref 0.61–1.24)
GFR, Estimated: >60 mL/min (ref >60)
Glucose, Bld: 115 mg/dL — ABNORMAL HIGH (ref 70–99)
Potassium: 3.9 mmol/L (ref 3.5–5.1)
Sodium: 135 mmol/L (ref 135–145)
Total Bilirubin: 0.3 mg/dL (ref 0.3–1.2)
Total Protein: 7.8 g/dL (ref 6.5–8.1)

## 2022-06-14 LAB — AMMONIA
Ammonia: 18 umol/L (ref 9–35)
Ammonia: 27 umol/L (ref 9–35)

## 2022-06-14 LAB — PHENYTOIN LEVEL, TOTAL: Phenytoin Lvl: 24.1 ug/mL — ABNORMAL HIGH (ref 10.0–20.0)

## 2022-06-14 LAB — ETHANOL: Alcohol, Ethyl (B): <10 mg/dL (ref <10)

## 2022-06-14 MED ORDER — PHENYTOIN SODIUM EXTENDED 100 MG PO CAPS
100.0000 mg | ORAL_CAPSULE | Freq: Two times a day (BID) | ORAL | Status: DC
  Administered 2022-06-15: 100 mg via ORAL
  Filled 2022-06-14: qty 1

## 2022-06-14 MED ORDER — ALBUTEROL SULFATE (2.5 MG/3ML) 0.083% IN NEBU: 2.5000 mg | INHALATION_SOLUTION | RESPIRATORY_TRACT | Status: DC | PRN

## 2022-06-14 MED ORDER — ACETAMINOPHEN 650 MG RE SUPP: 650.0000 mg | Freq: Four times a day (QID) | RECTAL | Status: DC | PRN

## 2022-06-14 MED ORDER — HEPARIN SODIUM (PORCINE) 5000 UNIT/ML IJ SOLN
5000.0000 [IU] | Freq: Three times a day (TID) | INTRAMUSCULAR | Status: DC
  Administered 2022-06-14 – 2022-06-22 (×22): 5000 [IU] via SUBCUTANEOUS
  Filled 2022-06-14 (×23): qty 1

## 2022-06-14 MED ORDER — SODIUM CHLORIDE 0.9 % IV SOLN
1.0000 g | Freq: Once | INTRAVENOUS | Status: AC
  Administered 2022-06-14: 1 g via INTRAVENOUS
  Filled 2022-06-14: qty 10

## 2022-06-14 MED ORDER — SODIUM CHLORIDE 0.45 % IV SOLN: INTRAVENOUS | Status: DC

## 2022-06-14 MED ORDER — ACETAMINOPHEN 325 MG PO TABS
650.0000 mg | ORAL_TABLET | Freq: Four times a day (QID) | ORAL | Status: DC | PRN
  Administered 2022-06-18 – 2022-06-19 (×2): 650 mg via ORAL
  Filled 2022-06-14 (×2): qty 2

## 2022-06-14 MED ORDER — LORAZEPAM 2 MG/ML IJ SOLN
0.5000 mg | Freq: Once | INTRAMUSCULAR | Status: AC
  Administered 2022-06-14: 0.5 mg via INTRAVENOUS
  Filled 2022-06-14: qty 1

## 2022-06-14 MED ORDER — ONDANSETRON HCL 4 MG PO TABS: 4.0000 mg | ORAL_TABLET | Freq: Four times a day (QID) | ORAL | Status: DC | PRN

## 2022-06-14 MED ORDER — ONDANSETRON HCL 4 MG/2ML IJ SOLN: 4.0000 mg | Freq: Four times a day (QID) | INTRAMUSCULAR | Status: DC | PRN
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
## 2022-06-14 NOTE — ED Triage Notes (Signed)
Patient BIB EMS from SNF([facility]) c/o AMS. Per report family notice patient being lethargic at the facility and wants to be check out in the hospital. Per report patient be able to ingage on conversation but unable to do with the family this afternoon. No report of fall. No report of fever and N/V.  Hx Dementia  BP 162/84 HR 70 RR 20 O2sat 100% on RA CBG 236

## 2022-06-14 NOTE — ED Notes (Signed)
ED TO INPATIENT HANDOFF REPORT  Name/Age/Gender [name] 66 y.o. male  Code Status Code Status History     Date Active Date Inactive Code Status Order ID Comments User Context   04/10/2022 0853 04/10/2022 1820 Full Code [id number]  Serafina Mitchell, MD Inpatient   02/06/2022 0912 02/14/2022 1759 Full Code [id number]  Serafina Mitchell, MD Inpatient   06/14/2021 1603 06/24/2021 2007 Full Code [id number]  Jonnie Finner, DO Inpatient   02/28/2021 0939 02/28/2021 1852 Full Code [id number]  Serafina Mitchell, MD Inpatient   06/11/2014 1956 06/13/2014 1325 Full Code [id number]  Ulyses Amor, PA-C Inpatient   10/18/2011 2332 10/19/2011 1836 Full Code [id number]  Ovidio Hanger, RN Inpatient       Home/SNF/Other Skilled nursing facility  Chief Complaint Complicated UTI (urinary tract infection) [N39.0]  Level of Care/Admitting Diagnosis ED Disposition     ED Disposition  Admit   Condition  --   Comment  Hospital Area: [facility] [[id number]]  Level of Care: Telemetry [5]  Admit to tele based on following criteria: Other see comments  Comments: UTI  May admit patient to [facility] or [facility] if equivalent level of care is available:: Yes  Covid Evaluation: Asymptomatic - no recent exposure (last 10 days) testing not required  Diagnosis: Complicated UTI (urinary tract infection) [361443]  Admitting Physician: Clance Boll [[id number]]  Attending Physician: Clance Boll [[id number]]  Certification:: I certify this patient will need inpatient services for at least 2 midnights  Estimated Length of Stay: 3          Medical History Past Medical History:  Diagnosis Date   AAA (abdominal aortic aneurysm) ([place])    Alcohol abuse    h/o, quit april 5th, 2013   Anxiety    Aortic aneurysm ([place])    Blood transfusion    Cardiomegaly    EF 25-30 % in 2005, EF 50-55% Oct 2013   Depression    Hyperlipidemia    Hypertension    Lumbar herniated disc     Memory deficits    Radiculopathy    Seizures ([place])    last seizure several years ago- per sister 40 or below at [facility] 07-12-17   TBI (traumatic brain injury) [facility])    Sept 1988   Urinary urgency     Allergies Allergies  Allergen Reactions   Isovue [Iopamidol] Hives    Pt broke out in one hive on his chest after contrast on 01/03/15.  Pt will need full premeds in the future per Dr Martinique.      IV Location/Drains/Wounds Patient Lines/Drains/Airways Status     Active Line/Drains/Airways     Name Placement date Placement time Site Days   Peripheral IV 06/14/22 20 G Anterior;Proximal;Right Forearm 06/14/22  1559  Forearm  less than 1   External Urinary Catheter 06/16/21  2200  --  363   External Urinary Catheter 02/07/22  1930  --  127   Incision (Closed) 06/11/14 Groin Bilateral;Left 06/11/14  0940  -- 2925   Incision (Closed) 02/07/22 Leg Right 02/07/22  1706  -- 127            Labs/Imaging Results for orders placed or performed during the hospital encounter of 06/14/22 (from the past 48 hour(s))  Urinalysis, Routine w reflex microscopic Urine, Clean Catch     Status: Abnormal   Collection Time: 06/14/22  3:37 PM  Result Value Ref Range   Color, Urine YELLOW YELLOW  APPearance TURBID (A) CLEAR   Specific Gravity, Urine 1.019 1.005 - 1.030   pH 5.0 5.0 - 8.0   Glucose, UA NEGATIVE NEGATIVE mg/dL   Hgb urine dipstick SMALL (A) NEGATIVE   Bilirubin Urine NEGATIVE NEGATIVE   Ketones, ur 5 (A) NEGATIVE mg/dL   Protein, ur 100 (A) NEGATIVE mg/dL   Nitrite NEGATIVE NEGATIVE   Leukocytes,Ua MODERATE (A) NEGATIVE   RBC / HPF 21-50 0 - 5 RBC/hpf   WBC, UA >50 (H) 0 - 5 WBC/hpf   Bacteria, UA RARE (A) NONE SEEN   WBC Clumps PRESENT     Comment: Performed at [facility], [place] [address], [place], [place] [address]  Rapid urine drug screen (hospital performed)     Status: Abnormal   Collection Time: 06/14/22  3:38 PM  Result Value Ref Range   Opiates NONE  DETECTED NONE DETECTED   Cocaine NONE DETECTED NONE DETECTED   Benzodiazepines NONE DETECTED NONE DETECTED   Amphetamines NONE DETECTED NONE DETECTED   Tetrahydrocannabinol NONE DETECTED NONE DETECTED   Barbiturates POSITIVE (A) NONE DETECTED    Comment: (NOTE) DRUG SCREEN FOR MEDICAL PURPOSES ONLY.  IF CONFIRMATION IS NEEDED FOR ANY PURPOSE, NOTIFY LAB WITHIN 5 DAYS.  LOWEST DETECTABLE LIMITS FOR URINE DRUG SCREEN Drug Class                     Cutoff (ng/mL) Amphetamine and metabolites    1000 Barbiturate and metabolites    200 Benzodiazepine                 200 Opiates and metabolites        300 Cocaine and metabolites        300 THC                            50 Performed at [facility], [place] [address], [place], [place] [address]   Lactic acid, plasma     Status: Abnormal   Collection Time: 06/14/22  3:48 PM  Result Value Ref Range   Lactic Acid, Venous 2.4 (HH) 0.5 - 1.9 mmol/L    Comment: CRITICAL RESULT CALLED TO, READ BACK BY AND VERIFIED WITH: Bufford Helms J. RN '@1634'$  ON 06/14/22 BY Benjaman Kindler C Performed at [facility], [place] [address], [place], [place] [address]   CBC with Differential     Status: Abnormal   Collection Time: 06/14/22  3:48 PM  Result Value Ref Range   WBC 10.5 4.0 - 10.5 K/uL   RBC 3.87 (L) 4.22 - 5.81 MIL/uL   Hemoglobin 10.9 (L) 13.0 - 17.0 g/dL   HCT 34.4 (L) 39.0 - 52.0 %   MCV 88.9 80.0 - 100.0 fL   MCH 28.2 26.0 - 34.0 pg   MCHC 31.7 30.0 - 36.0 g/dL   RDW 13.1 11.5 - 15.5 %   Platelets 436 (H) 150 - 400 K/uL   nRBC 0.0 0.0 - 0.2 %   Neutrophils Relative % 65 %   Neutro Abs 6.8 1.7 - 7.7 K/uL   Lymphocytes Relative 21 %   Lymphs Abs 2.2 0.7 - 4.0 K/uL   Monocytes Relative 11 %   Monocytes Absolute 1.2 (H) 0.1 - 1.0 K/uL   Eosinophils Relative 2 %   Eosinophils Absolute 0.2 0.0 - 0.5 K/uL   Basophils Relative 0 %   Basophils Absolute 0.0 0.0 - 0.1 K/uL   Immature Granulocytes  1 %   Abs Immature  Granulocytes 0.06 0.00 - 0.07 K/uL    Comment: Performed at [facility], [place] [address], [place], [place] [address]  Comprehensive metabolic panel     Status: Abnormal   Collection Time: 06/14/22  3:48 PM  Result Value Ref Range   Sodium 135 135 - 145 mmol/L   Potassium 3.9 3.5 - 5.1 mmol/L   Chloride 97 (L) 98 - 111 mmol/L   CO2 29 22 - 32 mmol/L   Glucose, Bld 115 (H) 70 - 99 mg/dL    Comment: Glucose reference range applies only to samples taken after fasting for at least 8 hours.   BUN 24 (H) 8 - 23 mg/dL   Creatinine, Ser 0.90 0.61 - 1.24 mg/dL   Calcium 8.9 8.9 - 10.3 mg/dL   Total Protein 7.8 6.5 - 8.1 g/dL   Albumin 2.8 (L) 3.5 - 5.0 g/dL   AST 32 15 - 41 U/L   ALT 23 0 - 44 U/L   Alkaline Phosphatase 91 38 - 126 U/L   Total Bilirubin 0.3 0.3 - 1.2 mg/dL   GFR, Estimated >60 >60 mL/min    Comment: (NOTE) Calculated using the CKD-EPI Creatinine Equation (2021)    Anion gap 9 5 - 15    Comment: Performed at [facility], [place] [address], [place], [place] [address]  Phenytoin level, total     Status: Abnormal   Collection Time: 06/14/22  3:48 PM  Result Value Ref Range   Phenytoin Lvl 24.1 (H) 10.0 - 20.0 ug/mL    Comment: Performed at [facility], [place] [address] [place], [place] [address]  Ethanol     Status: None   Collection Time: 06/14/22  3:48 PM  Result Value Ref Range   Alcohol, Ethyl (B) <10 <10 mg/dL    Comment: (NOTE) Lowest detectable limit for serum alcohol is 10 mg/dL.  For medical purposes only. Performed at [facility], [place] [address], [place], [place] [address]   Ammonia     Status: None   Collection Time: 06/14/22  3:48 PM  Result Value Ref Range   Ammonia 18 9 - 35 umol/L    Comment: Performed at [facility], [place] [address], [place], [place] [address]  Lactic acid, plasma     Status: Abnormal   Collection Time: 06/14/22  6:40 PM  Result Value Ref  Range   Lactic Acid, Venous 2.0 (HH) 0.5 - 1.9 mmol/L    Comment: CRITICAL RESULT CALLED TO, READ BACK BY AND VERIFIED WITH LIVINGSTON K. EMTP '@2022'$  ON 04/14/22 BY KERLANDIA C. Performed at [facility], [place] [address] [place], [place] [address]    CT Head Wo Contrast  Result Date: 06/14/2022 CLINICAL DATA:  Mental status change, unknown cause EXAM: CT HEAD WITHOUT CONTRAST TECHNIQUE: Contiguous axial images were obtained from the base of the skull through the vertex without intravenous contrast. RADIATION DOSE REDUCTION: This exam was performed according to the departmental dose-optimization program which includes automated exposure control, adjustment of the mA and/or kV according to patient size and/or use of iterative reconstruction technique. COMPARISON:  01/25/2022 FINDINGS: Brain: There is periventricular white matter decreased attenuation consistent with small vessel ischemic changes. Ventricles, sulci and cisterns are prominent consistent with age related involutional changes. No acute intracranial hemorrhage, mass effect or shift. No hydrocephalus. Left periventricular encephalomalacia consistent with an old lacunar CVA. Old lacunar CVA left cerebellum. Vascular: No hyperdense  vessel or unexpected calcification. Skull: Normal. Negative for fracture or focal lesion. Sinuses/Orbits: No acute finding. IMPRESSION: Atrophy and chronic small vessel ischemic changes. Chronic lacunar CVAs. No acute intracranial process identified. Electronically Signed   By: Sammie Bench M.D.   On: 06/14/2022 17:28    Pending Labs Unresulted Labs (From admission, onward)     Start     Ordered   06/14/22 2056  Lactic acid, plasma  Now then every 2 hours,   STAT        06/14/22 2055   06/14/22 1726  Urine Culture  Add-on,   AD       Question:  Indication  Answer:  Altered mental status (if no other cause identified)   06/14/22 1725            Vitals/Pain Today's Vitals   06/14/22  1853 06/14/22 1900 06/14/22 1905 06/14/22 2105  BP:   (!) 165/121 (!) 164/68  Pulse: 98 100 (!) 101 98  Resp: '14 17 20   '$ Temp:  98 F (36.7 C)    TempSrc:      SpO2: 98% 96% 97% 99%    Isolation Precautions No active isolations  Medications Medications  phenytoin (DILANTIN) ER capsule 100 mg (has no administration in time range)  cefTRIAXone (ROCEPHIN) 1 g in sodium chloride 0.9 % 100 mL IVPB (1 g Intravenous New Bag/Given 06/14/22 2046)  LORazepam (ATIVAN) injection 0.5 mg (0.5 mg Intravenous Given 06/14/22 2107)    Mobility manual wheelchair
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
## 2022-06-15 ENCOUNTER — Encounter (HOSPITAL_COMMUNITY): Payer: Self-pay | Admitting: Internal Medicine

## 2022-06-15 DIAGNOSIS — G9341 Metabolic encephalopathy: Secondary | ICD-10-CM | POA: Insufficient documentation

## 2022-06-15 DIAGNOSIS — L97429 Non-pressure chronic ulcer of left heel and midfoot with unspecified severity: Secondary | ICD-10-CM | POA: Insufficient documentation

## 2022-06-15 DIAGNOSIS — N390 Urinary tract infection, site not specified: Secondary | ICD-10-CM | POA: Insufficient documentation

## 2022-06-15 LAB — COMPREHENSIVE METABOLIC PANEL
ALT: 25 U/L (ref 0–44)
AST: 37 U/L (ref 15–41)
Albumin: 2.9 g/dL — ABNORMAL LOW (ref 3.5–5.0)
Alkaline Phosphatase: 91 U/L (ref 38–126)
Anion gap: 11 (ref 5–15)
BUN: 17 mg/dL (ref 8–23)
CO2: 24 mmol/L (ref 22–32)
Calcium: 8.6 mg/dL — ABNORMAL LOW (ref 8.9–10.3)
Chloride: 94 mmol/L — ABNORMAL LOW (ref 98–111)
Creatinine, Ser: 0.73 mg/dL (ref 0.61–1.24)
GFR, Estimated: >60 mL/min (ref >60)
Glucose, Bld: 137 mg/dL — ABNORMAL HIGH (ref 70–99)
Potassium: 3.8 mmol/L (ref 3.5–5.1)
Sodium: 129 mmol/L — ABNORMAL LOW (ref 135–145)
Total Bilirubin: 0.5 mg/dL (ref 0.3–1.2)
Total Protein: 7.7 g/dL (ref 6.5–8.1)

## 2022-06-15 LAB — PHENYTOIN LEVEL, TOTAL: Phenytoin Lvl: 16.4 ug/mL (ref 10.0–20.0)

## 2022-06-15 LAB — CBC
HCT: 33.6 % — ABNORMAL LOW (ref 39.0–52.0)
Hemoglobin: 10.5 g/dL — ABNORMAL LOW (ref 13.0–17.0)
MCH: 27.9 pg (ref 26.0–34.0)
MCHC: 31.3 g/dL (ref 30.0–36.0)
MCV: 89.4 fL (ref 80.0–100.0)
Platelets: 426 10*3/uL — ABNORMAL HIGH (ref 150–400)
RBC: 3.76 MIL/uL — ABNORMAL LOW (ref 4.22–5.81)
RDW: 13 % (ref 11.5–15.5)
WBC: 14.2 10*3/uL — ABNORMAL HIGH (ref 4.0–10.5)
nRBC: 0 % (ref 0.0–0.2)

## 2022-06-15 LAB — SEDIMENTATION RATE: Sed Rate: 100 mm/hr — ABNORMAL HIGH (ref 0–16)

## 2022-06-15 LAB — C-REACTIVE PROTEIN: CRP: 17.4 mg/dL — ABNORMAL HIGH (ref <1.0)

## 2022-06-15 LAB — TROPONIN I (HIGH SENSITIVITY)
Troponin I (High Sensitivity): 7 ng/L (ref <18)
Troponin I (High Sensitivity): 6 ng/L (ref <18)

## 2022-06-15 LAB — HIV ANTIBODY (ROUTINE TESTING W REFLEX): HIV Screen 4th Generation wRfx: NONREACTIVE

## 2022-06-15 LAB — D-DIMER, QUANTITATIVE: D-Dimer, Quant: 4.3 ug/mL-FEU — ABNORMAL HIGH (ref 0.00–0.50)

## 2022-06-15 LAB — CREATININE, SERUM
Creatinine, Ser: 0.72 mg/dL (ref 0.61–1.24)
GFR, Estimated: >60 mL/min (ref >60)

## 2022-06-15 MED ORDER — ASPIRIN 81 MG PO TBEC
81.0000 mg | DELAYED_RELEASE_TABLET | Freq: Every day | ORAL | Status: DC
  Administered 2022-06-15 – 2022-06-18 (×4): 81 mg via ORAL
  Filled 2022-06-15 (×4): qty 1

## 2022-06-15 MED ORDER — PRAVASTATIN SODIUM 40 MG PO TABS
80.0000 mg | ORAL_TABLET | Freq: Every day | ORAL | Status: DC
  Administered 2022-06-15 – 2022-06-21 (×7): 80 mg via ORAL
  Filled 2022-06-15 (×7): qty 2

## 2022-06-15 MED ORDER — PHENYTOIN 50 MG PO CHEW
200.0000 mg | CHEWABLE_TABLET | Freq: Every day | ORAL | Status: DC
  Administered 2022-06-16 – 2022-06-21 (×6): 200 mg via ORAL
  Filled 2022-06-15 (×8): qty 4

## 2022-06-15 MED ORDER — BUSPIRONE HCL 10 MG PO TABS
10.0000 mg | ORAL_TABLET | Freq: Three times a day (TID) | ORAL | Status: DC
  Administered 2022-06-15 – 2022-06-22 (×20): 10 mg via ORAL
  Filled 2022-06-15 (×21): qty 1

## 2022-06-15 MED ORDER — CLOPIDOGREL BISULFATE 75 MG PO TABS
75.0000 mg | ORAL_TABLET | Freq: Every day | ORAL | Status: DC
  Administered 2022-06-15 – 2022-06-18 (×4): 75 mg via ORAL
  Filled 2022-06-15 (×4): qty 1

## 2022-06-15 MED ORDER — PHENYTOIN 50 MG PO CHEW
100.0000 mg | CHEWABLE_TABLET | Freq: Every day | ORAL | Status: DC
  Administered 2022-06-16 – 2022-06-22 (×6): 100 mg via ORAL
  Filled 2022-06-15 (×7): qty 2

## 2022-06-15 MED ORDER — SODIUM CHLORIDE 0.9 % IV SOLN
1.0000 g | INTRAVENOUS | Status: DC
  Administered 2022-06-15: 1 g via INTRAVENOUS
  Filled 2022-06-15 (×2): qty 10

## 2022-06-15 MED ORDER — LORAZEPAM 2 MG/ML IJ SOLN
0.5000 mg | Freq: Three times a day (TID) | INTRAMUSCULAR | Status: AC | PRN
  Administered 2022-06-15 (×3): 0.5 mg via INTRAVENOUS
  Filled 2022-06-15 (×3): qty 1

## 2022-06-15 MED ORDER — SODIUM CHLORIDE 0.9 % IV SOLN: INTRAVENOUS | Status: DC

## 2022-06-15 MED ORDER — PNEUMOCOCCAL 20-VAL CONJ VACC 0.5 ML IM SUSY
0.5000 mL | PREFILLED_SYRINGE | INTRAMUSCULAR | Status: AC
  Administered 2022-06-17: 0.5 mL via INTRAMUSCULAR
  Filled 2022-06-15: qty 0.5

## 2022-06-15 MED ORDER — ATENOLOL 25 MG PO TABS
25.0000 mg | ORAL_TABLET | Freq: Every day | ORAL | Status: DC
  Administered 2022-06-15 – 2022-06-22 (×7): 25 mg via ORAL
  Filled 2022-06-15 (×8): qty 1

## 2022-06-15 NOTE — TOC Initial Note (Signed)
Transition of Care [facility]) - Initial/Assessment Note    Patient Details  Name: [name] MRN: [id number] Date of Birth: [birth year]  [facility] CM/SW Contact:    Vassie Moselle, LCSW Phone Number: 06/15/2022, 11:13 AM  Clinical Narrative:                 Pt coming from [place] where he is a LTC resident. CSW contacted Albina Billet at [facility] who confirms pt is able to return at discharge.  Spoke with pt's sister, Sydney Azure who confirms plan for pt to return at discharge. Per chart review pt was referred to OP palliative care through [facility] at discharge on prior admission however, pt's sister states she does not believe he is getting these services and declines referral for PC.   Expected Discharge Plan: Skilled Nursing Facility Barriers to Discharge: No Barriers Identified   Patient Goals and CMS Choice Patient states their goals for this hospitalization and ongoing recovery are:: For pt to return to SNF CMS Medicare.gov Compare Post Acute Care list provided to:: Patient Represenative (must comment) (Sister) Choice offered to / list presented to : Sibling [place] ownership interest in [facility].provided to::  (NA)    Expected Discharge Plan and Services In-house Referral: NA Discharge Planning Services: CM Consult Post Acute Care Choice: Nursing Home, [place] Living arrangements for the past 2 months: [place]                 DME Arranged: N/A DME Agency: NA                  Prior Living Arrangements/Services Living arrangements for the past 2 months: [place] Lives with:: Facility Resident Patient language and need for interpreter reviewed:: Yes Do you feel safe going back to the place where you live?: Yes      Need for Family Participation in Patient Care: Yes (Comment) Care giver support system in place?: Yes (comment)   Criminal Activity/Legal Involvement Pertinent to  Current Situation/Hospitalization: No - Comment as needed  Activities of Daily Living Home Assistive Devices/Equipment: Grab bars around toilet, Grab bars in shower, Shower chair with back, Wheelchair ADL Screening (condition at time of admission) Patient's cognitive ability adequate to safely complete daily activities?: No Is the patient deaf or have difficulty hearing?: No Does the patient have difficulty seeing, even when wearing glasses/contacts?: No Does the patient have difficulty concentrating, remembering, or making decisions?: Yes Patient able to express need for assistance with ADLs?: Yes Does the patient have difficulty dressing or bathing?: No Independently performs ADLs?: No Communication: Independent Dressing (OT): Needs assistance Is this a change from baseline?: Pre-admission baseline Grooming: Needs assistance Is this a change from baseline?: Pre-admission baseline Feeding: Independent Bathing: Needs assistance Is this a change from baseline?: Pre-admission baseline Toileting: Needs assistance Is this a change from baseline?: Pre-admission baseline In/Out Bed: Dependent Is this a change from baseline?: Pre-admission baseline Walks in Home: Dependent Is this a change from baseline?: Pre-admission baseline Does the patient have difficulty walking or climbing stairs?: Yes Weakness of Legs: Left Weakness of Arms/Hands: None  Permission Sought/Granted Permission sought to share information with : Facility Sport and exercise psychologist, Family Supports Permission granted to share information with : Yes, Verbal Permission Granted  Share Information with NAME: Delmore Sear  Permission granted to share info w AGENCY: [facility]  Permission granted to share info w Relationship: Sister  Permission granted to share info w Contact Information: [phone]  Emotional Assessment   Attitude/Demeanor/Rapport: Unable to Assess Affect (typically observed): Unable to  Assess Orientation: : Oriented to Self Alcohol / Substance Use: Not Applicable Psych Involvement: No (comment)  Admission diagnosis:  Delirium [R41.0] Acute cystitis with hematuria [A63.01] Complicated UTI (urinary tract infection) [N39.0] Elevated phenytoin level [R78.89] Patient Active Problem List   Diagnosis Date Noted   Complicated UTI (urinary tract infection) 06/14/2022   Anxiety    PAD (peripheral artery disease) ([place]) 02/06/2022   Protein-calorie malnutrition, severe 06/15/2021   Subdural hematoma ([place]) 06/14/2021   Dementia without behavioral disturbance ([place]) 04/25/2021   Lung nodule    AAA (abdominal aortic aneurysm) without rupture ([place]) 10/21/2012   Thoracic aneurysm without mention of rupture 10/21/2012   Hyperlipidemia 10/18/2012   Poor short term memory 10/18/2012   Depression 10/16/2012   Dry skin 10/16/2012   Urge urinary incontinence 08/26/2012   Lumbosacral radiculopathy at L4 07/30/2012   Hyperextension of knee or lower leg 05/08/2012   Shoulder pain, right 02/16/2012   Healthcare maintenance 02/16/2012   Cardiomyopathy, nonischemic ([place]) 02/14/2012   Short-term memory loss 02/14/2012   Acute hyponatremia 10/18/2011   Seizures ([place]) 10/18/2011   Hypertension 10/18/2011   TBI (traumatic brain injury) ([place]) 10/18/2011   H/O alcohol abuse 10/18/2011   Tobacco abuse 10/18/2011   PCP:  Jeanette Caprice, PA-C Pharmacy:  No Pharmacies Listed    Social Determinants of Health (SDOH) Social History: SDOH Screenings   Food Insecurity: No Food Insecurity (06/15/2022)  Housing: Low Risk  (06/15/2022)  Transportation Needs: No Transportation Needs (06/15/2022)  Utilities: Not At Risk (06/15/2022)  Tobacco Use: Medium Risk (06/15/2022)   SDOH Interventions:     Readmission Risk Interventions    06/15/2022   11:08 AM 02/14/2022   12:07 PM  Readmission Risk Prevention Plan  Transportation Screening Complete Complete  PCP or Specialist Appt  within 5-7 Days Complete   PCP or Specialist Appt within 3-5 Days  Complete  Home Care Screening Complete   Medication Review (RN CM) Complete   HRI or Home Care Consult  Complete  Social Work Consult for [place] Planning/Counseling  Complete  Palliative Care Screening  Complete  Medication Review Press photographer)  Complete

## 2022-06-15 NOTE — NC FL2 (Signed)
[place] LEVEL OF CARE FORM     IDENTIFICATION  Patient Name: [name] Birthdate: February 08, 1956 Sex: male Admission Date (Current Location): 06/14/2022  [place] and [place] Number:  [name] [id number] [place] and Address:  [facility],  [place], [place]      Provider Number: [id number]  Attending Physician Name and Address:  Chris Phi, DO  Relative Name and Phone Number:  [name], [name] [phone]    Current Level of Care: Hospital Recommended Level of Care: Nursing Facility Prior Approval Number:    Date Approved/Denied:   PASRR Number:    Discharge Plan: SNF    Current Diagnoses: Patient Active Problem List   Diagnosis Date Noted   Complicated UTI (urinary tract infection) 06/14/2022   Anxiety    PAD (peripheral artery disease) ([place]) 02/06/2022   Protein-calorie malnutrition, severe 06/15/2021   Subdural hematoma ([place]) 06/14/2021   Dementia without behavioral disturbance ([place]) 04/25/2021   Lung nodule    AAA (abdominal aortic aneurysm) without rupture ([place]) 10/21/2012   Thoracic aneurysm without mention of rupture 10/21/2012   Hyperlipidemia 10/18/2012   Poor short term memory 10/18/2012   Depression 10/16/2012   Dry skin 10/16/2012   Urge urinary incontinence 08/26/2012   Lumbosacral radiculopathy at L4 07/30/2012   Hyperextension of knee or lower leg 05/08/2012   Shoulder pain, right 02/16/2012   Healthcare maintenance 02/16/2012   Cardiomyopathy, nonischemic ([place]) 02/14/2012   Short-term memory loss 02/14/2012   Acute hyponatremia 10/18/2011   Seizures ([place]) 10/18/2011   Hypertension 10/18/2011   TBI (traumatic brain injury) ([place]) 10/18/2011   H/O alcohol abuse 10/18/2011   Tobacco abuse 10/18/2011    Orientation RESPIRATION BLADDER Height & Weight     Self  Normal Incontinent, External catheter Weight:   Height:     BEHAVIORAL SYMPTOMS/MOOD NEUROLOGICAL BOWEL NUTRITION  STATUS      Incontinent Diet (Regular)  AMBULATORY STATUS COMMUNICATION OF NEEDS Skin   Limited Assist Verbally                         Personal Care Assistance Level of Assistance  Bathing, Feeding, Dressing Bathing Assistance: Limited assistance Feeding assistance: Independent Dressing Assistance: Limited assistance     Functional Limitations Info  Sight, Hearing, Speech Sight Info: Impaired Hearing Info: Adequate Speech Info: Adequate    SPECIAL CARE FACTORS FREQUENCY                       Contractures Contractures Info: Not present    Additional Factors Info  Code Status, Allergies, Psychotropic Code Status Info: FULL Allergies Info: Isovue (Iopamidol) Psychotropic Info: See MAR         Current Medications (06/15/2022):  This is the current hospital active medication list Current Facility-Administered Medications  Medication Dose Route Frequency Provider Last Rate Last Admin   0.9 %  sodium chloride infusion   Intravenous Continuous Chris Phi, DO       acetaminophen (TYLENOL) tablet 650 mg  650 mg Oral Q6H PRN Clance Boll, MD       Or   acetaminophen (TYLENOL) suppository 650 mg  650 mg Rectal Q6H PRN Clance Boll, MD       albuterol (PROVENTIL) (2.5 MG/3ML) 0.083% nebulizer solution 2.5 mg  2.5 mg Nebulization Q2H PRN Myles Rosenthal A, MD       cefTRIAXone (ROCEPHIN) 1 g in sodium chloride 0.9 % 100 mL IVPB  1 g Intravenous Q24H Myles Rosenthal A, MD       heparin injection 5,000 Units  5,000 Units Subcutaneous Q8H Myles Rosenthal A, MD   5,000 Units at 06/14/22 2345   LORazepam (ATIVAN) injection 0.5 mg  0.5 mg Intravenous Q8H PRN Myles Rosenthal A, MD   0.5 mg at 06/15/22 0414   ondansetron (ZOFRAN) tablet 4 mg  4 mg Oral Q6H PRN Clance Boll, MD       Or   ondansetron [facility]) injection 4 mg  4 mg Intravenous Q6H PRN Clance Boll, MD       [START ON 06/16/2022] pneumococcal 20-valent conjugate vaccine  (PREVNAR 20) injection 0.5 mL  0.5 mL Intramuscular Tomorrow-1000 Chris Phi, DO         Discharge Medications: Please see discharge summary for a list of discharge medications.  Relevant Imaging Results:  Relevant Lab Results:   Additional Information [id number]  Chris Moselle, LCSW
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
## 2022-06-15 NOTE — Progress Notes (Signed)
PROGRESS NOTE    [name]  QMV:[id number] DOB: Apr 27, 1956 DOA: 06/14/2022 PCP: Jeanette Caprice, PA-C     Brief Narrative:  [name] is a 66 y.o. 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 Patient presents to ED from NH after family visited patient and noted that he was not his usual self. In addition there was question of a possible ground level fall, of note no evidence of fall or visible injury was noted per NH. Due to these issues, at family request patient was transferred to ED by EMS.   New events last 24 hours / Subjective: Patient asking me to take care of his money. He has mittens on and is confused. Sister is at bedside.  She is his primary caregiver.  Patient is unmarried, does not have any children.  Has 1 other brother who does not live in town.  Patient's sister states that at baseline, he is fully conversational.  He needs assistance in and out of wheelchair, but is able to feed himself usually.  His presentation today is not his normal.  Assessment & Plan:   Principal Problem:   Acute metabolic encephalopathy Active Problems:   Hyponatremia   Seizures (HCC)   Hypertension   TBI (traumatic brain injury) ([place])   Depression   Hyperlipidemia   Dementia without behavioral disturbance (HCC)   PAD (peripheral artery disease) (HCC)   Complicated UTI (urinary tract infection)   UTI (urinary tract infection)   Chronic ulcer of left heel (HCC)   Acute metabolic encephalopathy, in setting of underlying TBI, cognitive memory loss -Could be secondary to infection, supratherapeutic phenytoin level -Hold neurontin, remeron for now   Possible underlying UTI -Urine  culture is pending -Rocephin  Chronic left heel foot ulcer with underlying PAD  -Followed by vascular surgery outpatient -Aspirin, plavix  -Due to elevation in WBC, check sed rate, CRP, MRI left foot to rule out underlying osteomyelitis  Seizure disorder Supratherapeutic phenytoin level -At time of admission, EDP discussed with neurology who recommended holding patient's nighttime phenytoin dose -Discussed with pharmacy this morning, repeat phenytoin level today and resume med when level is < or = 20. His PTA dose is '150mg'$  in AM, '200mg'$  qhs (confirmed with [facility]). Could resume at '100mg'$  in AM and '200mg'$  qhs OR '300mg'$  qhs ER. In total goal would be to reduce by '50mg'$    Hyponatremia -IVF  Elevated D Dimer -No clinical sign of PE, remains stable on room air   HTN -Atenolol   HLD -Pravachol     DVT prophylaxis:  heparin injection 5,000 Units Start: 06/14/22 2230  Code Status: Full Family Communication: Sister at bedside  Disposition Plan:  Status is: Inpatient Remains inpatient appropriate because: MRI pending. IV antibiotics    Antimicrobials:  Anti-infectives (From admission, onward)    Start     Dose/Rate Route Frequency Ordered Stop   06/15/22 2100  cefTRIAXone (ROCEPHIN) 1 g in sodium chloride 0.9 % 100 mL IVPB        1 g 200 mL/hr over 30 Minutes Intravenous Every 24 hours 06/15/22 0335     06/14/22 1845  cefTRIAXone (ROCEPHIN) 1 g in sodium chloride 0.9 % 100 mL IVPB  1 g 200 mL/hr over 30 Minutes Intravenous  Once 06/14/22 1832 06/14/22 2116        Objective: Vitals:   06/14/22 2326 06/15/22 0359 06/15/22 0751 06/15/22 1141  BP: (!) 166/99 135/77 (!) 154/70 139/61  Pulse: (!) 102 (!) 106 97 93  Resp: '17 18 20 '$ (!) 22  Temp: 99.1 F (37.3 C) 99.3 F (37.4 C) 100.2 F (37.9 C) 98.5 F (36.9 C)  TempSrc: Oral Oral Oral Oral  SpO2: 100% 99% 97% 99%    Intake/Output Summary (Last 24 hours) at 06/15/2022 1248 Last data filed at 06/14/2022  2200 Gross per 24 hour  Intake 100 ml  Output 600 ml  Net -500 ml   There were no vitals filed for this visit.  Examination:  General exam: Appears calm, confused  Extremities: right AKA  Skin:      Data Reviewed: I have personally reviewed following labs and imaging studies  CBC: Recent Labs  Lab 06/14/22 1548 06/14/22 2301 06/15/22 0121  WBC 10.5 13.1* 14.2*  NEUTROABS 6.8  --   --   HGB 10.9* 10.4* 10.5*  HCT 34.4* 32.4* 33.6*  MCV 88.9 87.8 89.4  PLT 436* 422* 742*   Basic Metabolic Panel: Recent Labs  Lab 06/14/22 1548 06/14/22 2301 06/15/22 0121  NA 135  --  129*  K 3.9  --  3.8  CL 97*  --  94*  CO2 29  --  24  GLUCOSE 115*  --  137*  BUN 24*  --  17  CREATININE 0.90 0.72 0.73  CALCIUM 8.9  --  8.6*   GFR: CrCl cannot be calculated (Unknown ideal weight.). Liver Function Tests: Recent Labs  Lab 06/14/22 1548 06/15/22 0121  AST 32 37  ALT 23 25  ALKPHOS 91 91  BILITOT 0.3 0.5  PROT 7.8 7.7  ALBUMIN 2.8* 2.9*   No results for input(s): "LIPASE", "AMYLASE" in the last 168 hours. Recent Labs  Lab 06/14/22 1548 06/14/22 2301  AMMONIA 18 27   Coagulation Profile: No results for input(s): "INR", "PROTIME" in the last 168 hours. Cardiac Enzymes: No results for input(s): "CKTOTAL", "CKMB", "CKMBINDEX", "TROPONINI" in the last 168 hours. BNP (last 3 results) No results for input(s): "PROBNP" in the last 8760 hours. HbA1C: No results for input(s): "HGBA1C" in the last 72 hours. CBG: No results for input(s): "GLUCAP" in the last 168 hours. Lipid Profile: No results for input(s): "CHOL", "HDL", "LDLCALC", "TRIG", "CHOLHDL", "LDLDIRECT" in the last 72 hours. Thyroid Function Tests: No results for input(s): "TSH", "T4TOTAL", "FREET4", "T3FREE", "THYROIDAB" in the last 72 hours. Anemia Panel: No results for input(s): "VITAMINB12", "FOLATE", "FERRITIN", "TIBC", "IRON", "RETICCTPCT" in the last 72 hours. Sepsis Labs: Recent Labs  Lab  06/14/22 1548 06/14/22 1840 06/14/22 2056  LATICACIDVEN 2.4* 2.0* 1.0    No results found for this or any previous visit (from the past 240 hour(s)).    Radiology Studies: CT Head Wo Contrast  Result Date: 06/14/2022 CLINICAL DATA:  Mental status change, unknown cause EXAM: CT HEAD WITHOUT CONTRAST TECHNIQUE: Contiguous axial images were obtained from the base of the skull through the vertex without intravenous contrast. RADIATION DOSE REDUCTION: This exam was performed according to the departmental dose-optimization program which includes automated exposure control, adjustment of the mA and/or kV according to patient size and/or use of iterative reconstruction technique. COMPARISON:  01/25/2022 FINDINGS: Brain: There is periventricular white matter decreased attenuation consistent with small vessel ischemic changes. Ventricles, sulci and cisterns are prominent consistent  with age related involutional changes. No acute intracranial hemorrhage, mass effect or shift. No hydrocephalus. Left periventricular encephalomalacia consistent with an old lacunar CVA. Old lacunar CVA left cerebellum. Vascular: No hyperdense vessel or unexpected calcification. Skull: Normal. Negative for fracture or focal lesion. Sinuses/Orbits: No acute finding. IMPRESSION: Atrophy and chronic small vessel ischemic changes. Chronic lacunar CVAs. No acute intracranial process identified. Electronically Signed   By: Sammie Bench M.D.   On: 06/14/2022 17:28      Scheduled Meds:  aspirin EC  81 mg Oral Daily   atenolol  25 mg Oral Daily   busPIRone  10 mg Oral TID   clopidogrel  75 mg Oral Daily   heparin  5,000 Units Subcutaneous Q8H   [START ON 06/16/2022] pneumococcal 20-valent conjugate vaccine  0.5 mL Intramuscular Tomorrow-1000   pravastatin  80 mg Oral QHS   Continuous Infusions:  sodium chloride 100 mL/hr at 06/15/22 1124   cefTRIAXone (ROCEPHIN)  IV       LOS: 1 day   Time spent: 40 minutes   Dessa Phi, DO [facility] 06/15/2022, 12:48 PM   Available via Epic secure chat 7am-7pm After these hours, please refer to coverage provider listed on [email or web address]

## 2022-06-16 ENCOUNTER — Inpatient Hospital Stay (HOSPITAL_COMMUNITY): Payer: Medicaid Other

## 2022-06-16 DIAGNOSIS — G9341 Metabolic encephalopathy: Secondary | ICD-10-CM | POA: Diagnosis not present

## 2022-06-16 LAB — CBC
HCT: 30.3 % — ABNORMAL LOW (ref 39.0–52.0)
Hemoglobin: 9.7 g/dL — ABNORMAL LOW (ref 13.0–17.0)
MCH: 27.6 pg (ref 26.0–34.0)
MCHC: 32 g/dL (ref 30.0–36.0)
MCV: 86.3 fL (ref 80.0–100.0)
Platelets: 412 10*3/uL — ABNORMAL HIGH (ref 150–400)
RBC: 3.51 MIL/uL — ABNORMAL LOW (ref 4.22–5.81)
RDW: 13 % (ref 11.5–15.5)
WBC: 10.9 10*3/uL — ABNORMAL HIGH (ref 4.0–10.5)
nRBC: 0 % (ref 0.0–0.2)

## 2022-06-16 LAB — BASIC METABOLIC PANEL
Anion gap: 8 (ref 5–15)
BUN: 10 mg/dL (ref 8–23)
CO2: 24 mmol/L (ref 22–32)
Calcium: 8.5 mg/dL — ABNORMAL LOW (ref 8.9–10.3)
Chloride: 100 mmol/L (ref 98–111)
Creatinine, Ser: 0.61 mg/dL (ref 0.61–1.24)
GFR, Estimated: >60 mL/min (ref >60)
Glucose, Bld: 105 mg/dL — ABNORMAL HIGH (ref 70–99)
Potassium: 3.5 mmol/L (ref 3.5–5.1)
Sodium: 132 mmol/L — ABNORMAL LOW (ref 135–145)

## 2022-06-16 LAB — GLUCOSE, CAPILLARY: Glucose-Capillary: 99 mg/dL (ref 70–99)

## 2022-06-16 MED ORDER — LORAZEPAM 2 MG/ML IJ SOLN
0.5000 mg | Freq: Four times a day (QID) | INTRAMUSCULAR | Status: DC | PRN
  Administered 2022-06-16: 0.5 mg via INTRAVENOUS
  Filled 2022-06-16: qty 1

## 2022-06-16 MED ORDER — HALOPERIDOL LACTATE 5 MG/ML IJ SOLN
2.0000 mg | Freq: Four times a day (QID) | INTRAMUSCULAR | Status: DC | PRN
  Administered 2022-06-17: 2 mg via INTRAVENOUS
  Filled 2022-06-16: qty 1

## 2022-06-16 MED ORDER — VANCOMYCIN HCL IN DEXTROSE 1-5 GM/200ML-% IV SOLN
1000.0000 mg | Freq: Once | INTRAVENOUS | Status: AC
  Administered 2022-06-16: 1000 mg via INTRAVENOUS
  Filled 2022-06-16: qty 200

## 2022-06-16 MED ORDER — PIPERACILLIN-TAZOBACTAM 3.375 G IVPB
3.3750 g | Freq: Three times a day (TID) | INTRAVENOUS | Status: AC
  Administered 2022-06-16 – 2022-06-20 (×14): 3.375 g via INTRAVENOUS
  Filled 2022-06-16 (×15): qty 50

## 2022-06-16 MED ORDER — VANCOMYCIN HCL 750 MG/150ML IV SOLN
750.0000 mg | INTRAVENOUS | Status: AC
  Administered 2022-06-17 – 2022-06-20 (×3): 750 mg via INTRAVENOUS
  Filled 2022-06-16 (×4): qty 150

## 2022-06-16 NOTE — Progress Notes (Addendum)
Pharmacy Antibiotic Note  [name] is a 66 y.o. 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 Admitted with acute metabolic encephalopathy.  Initially on Rocephin.  Today, MR left heel ordered.  Zosyn initiated by MD and Pharmacy has been consulted for vancomycin dosing for osteomyelitis.  Plan: Vancomycin 1 g IV x 1 then 750 mg IV every 24 hours (Goal AUC 400-550, calculated AUC 438.7, SCr used: rounded up to 0.8) Serum creatinine daily x 3 Monitor clinical progress, renal function, vancomycin levels as indicated F/U C&S, abx deescalation / LOT   Weight: 44.6 kg (98 lb 5.2 oz)  Temp (24hrs), Avg:98.1 F (36.7 C), Min:97.7 F (36.5 C), Max:98.5 F (36.9 C)  Recent Labs  Lab 06/14/22 1548 06/14/22 1840 06/14/22 2056 06/14/22 2301 06/15/22 0121 06/16/22 0701  WBC 10.5  --   --  13.1* 14.2* 10.9*  CREATININE 0.90  --   --  0.72 0.73 0.61  LATICACIDVEN 2.4* 2.0* 1.0  --   --   --     Estimated Creatinine Clearance: 58.8 mL/min (by C-G formula based on SCr of 0.61 mg/dL).    Allergies  Allergen Reactions   Isovue [Iopamidol] Hives    Pt broke out in one hive on his chest after contrast on 01/03/15.  Pt will need full premeds in the future per Dr [place].      Antimicrobials this admission: 12/21 Rocephin >> 12/23 12/23 Zosyn >>  12/23 vancomycin >>  Dose adjustments this admission:   Microbiology results: 12/21 UCx: sent   Thank you for allowing pharmacy to be a part of this patient's care.  Royetta Asal, PharmD, BCPS Clinical Pharmacist [place] Please utilize Amion for appropriate phone number to reach the unit pharmacist ([place]) 06/16/2022 8:16 AM

## 2022-06-16 NOTE — Evaluation (Signed)
Occupational Therapy Evaluation Patient Details Name: [name] MRN: [id number] DOB: Jan 06, 1956 Today's Date: 06/16/2022   History of Present Illness Mr. [name] is a 66 yr old male admitted to the hospital with altered mental status. He was found to have acute metabolic encephalopathy. PMH: TBI, seizure disorder, HTN, HLD, AAA, PAD, R AKA   Clinical Impression   Mr. [name] presented with lethargy and decreased command follow. He nodded his head a couple times to respond to simple questions, otherwise he presented with limited active participation during the session. Per his medical chart, he is a long-term care resident of a nursing facility. Based on clinical judgement & his observed functional state this date, he likely requires increased assist with ADLs at his baseline & may near or at his baseline level of functioning in this regard. As such, OT will sign off and recommend he return to the nursing facility at discharge.      Recommendations for follow up therapy are one component of a multi-disciplinary discharge planning process, led by the attending physician.  Recommendations may be updated based on patient status, additional functional criteria and insurance authorization.   Follow Up Recommendations  Other (comment) (Return to long-term care at nursing facility)     Assistance Recommended at Discharge Frequent or constant Supervision/Assistance  Patient can return home with the following A lot of help with bathing/dressing/bathroom;Assistance with feeding;Direct supervision/assist for medications management;A lot of help with walking and/or transfers    Functional Status Assessment  Patient has not had a recent decline in their functional status  Equipment Recommendations  None recommended by OT       Precautions / Restrictions Restrictions Weight Bearing Restrictions: No Other Position/Activity Restrictions: R AKA      Mobility Bed Mobility      General bed  mobility comments: The patient required total assist for repositioning at bed level. He did not initiate bed mobility to prompt          ADL either performed or assessed with clinical judgement   ADL Overall ADL's : Needs assistance/impaired;At baseline based on clinical judgement             Pertinent Vitals/Pain Pain Assessment Pain Location: He shook his head to indicate "no" when asked if he was in any pain.     Hand Dominance  Unknown   Extremity/Trunk Assessment Upper Extremity Assessment RUE Deficits / Details: Patient did not initiate or actively participate when formal ROM testing was attempted. PROM appeared grossly WFL LUE Deficits / Details: Patient did not initiate or actively partciipate when formal ROM testing was attempted. 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 Contracture of L knee observed)       Communication     Cognition Arousal/Alertness: Lethargic Behavior During Therapy: Flat affect Overall Cognitive Status: History of cognitive impairments - at baseline (Patient has a history of TBI, per medical chart.)        General Comments: He did not respond to orientation questions, other than shaking his head to indicate "yes" when asked if his name was [name].  Minimal command follow                Home Living Family/patient expects to be discharged to:: Skilled nursing facility        Prior Functioning/Environment                 ADLs Comments: The patient was lethargic  and did not respond to questions regarding his PLOF and living situation. Per his medical chart, he is a long-term care resident of a nursing facility.  Based on clinical judgement & his observed functional state, he likely requires assist for ADLs at the facility.        OT Problem List: Decreased strength;Decreased range of motion;Decreased cognition;Impaired tone      OT  Treatment/Interventions:   No further OT treatment needs identified in the acute setting   OT Goals(Current goals can be found in the care plan section) Acute Rehab OT Goals Patient Stated Goal: Patient did not state  OT Frequency:         AM-PAC OT "6 Clicks" Daily Activity     Outcome Measure Help from another person eating meals?: A Lot Help from another person taking care of personal grooming?: A Lot Help from another person toileting, which includes using toliet, bedpan, or urinal?: Total Help from another person bathing (including washing, rinsing, drying)?: Total Help from another person to put on and taking off regular upper body clothing?: A Lot Help from another person to put on and taking off regular lower body clothing?: Total 6 Click Score: 9   End of Session Nurse Communication: Mobility status  Activity Tolerance: Patient limited by lethargy Patient left: in bed;with call bell/phone within reach;with bed alarm set  OT Visit Diagnosis: Muscle weakness (generalized) (M62.81)                Time: 5400-8676 OT Time Calculation (min): 15 min Charges:  OT General Charges $OT Visit: 1 Visit OT Evaluation $OT Eval Moderate Complexity: 1 Mod    Keirstyn Aydt L [place], OTR/L 06/16/2022, 4:03 PM

## 2022-06-16 NOTE — Progress Notes (Addendum)
PROGRESS NOTE    [name]  NUU:[id number] DOB: [birth year] DOA: 06/14/2022 PCP: Jeanette Caprice, PA-C     Brief Narrative:  [name] is a 66 y.o. 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 Patient presents to ED from NH after family visited patient and noted that he was not his usual self. In addition there was question of a possible ground level fall, of note no evidence of fall or visible injury was noted per NH. Due to these issues, at family request patient was transferred to ED by EMS. Initially thought his AMS was due to UTI. He was started on rocephin. However, due to patient's chronic left heel wound, osteomyelitis was also considered. He underwent MRI 12/23.   New events last 24 hours / Subjective: Patient is alert but not oriented to place or year.  This is not his baseline.  Sister is at bedside, states that his mentation has slightly improved but not to baseline yet.  No new issues overnight.  Awaiting MRI.  Assessment & Plan:   Principal Problem:   Acute metabolic encephalopathy Active Problems:   Hyponatremia   Seizures (HCC)   Hypertension   TBI (traumatic brain injury) ([place])   Depression   Hyperlipidemia   Dementia without behavioral disturbance (HCC)   PAD (peripheral artery disease) (HCC)   Complicated UTI (urinary tract infection)   UTI (urinary tract infection)   Chronic ulcer of left heel (HCC)   Acute metabolic encephalopathy, in setting of underlying TBI, cognitive memory loss -Could be secondary to infection, supratherapeutic phenytoin level -Hold neurontin, remeron for now   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 Trula Slade. No re-vascularization options noted from outpatient note -Aspirin, plavix  -CRP 17.4, sed rate 100 -Vancomycin, Zosyn -MRI positive for posterior heel ulcer with underlying osteomyelitis. Requested vascular surgery consult. Transfer pt to [facility].   Possible underlying UTI -Urine culture is pending -Zosyn as above  Seizure disorder Supratherapeutic phenytoin level -Phenytoin level normalized.  Decrease dose to resume at '100mg'$  in AM and '200mg'$  qhs  Hyponatremia -IVF -Improved   Elevated D Dimer -No clinical sign of PE, remains stable on room air   HTN -Atenolol   HLD -Pravachol     DVT prophylaxis:  heparin injection 5,000 Units Start: 06/14/22 2230  Code Status: Full Family Communication: Sister at bedside  Disposition Plan:  Status is: Inpatient Remains inpatient appropriate because: MRI pending. IV antibiotics    Antimicrobials:  Anti-infectives (From admission, onward)    Start     Dose/Rate Route Frequency Ordered Stop   06/17/22 1000  vancomycin (VANCOREADY) IVPB 750 mg/150 mL        750 mg 150 mL/hr over 60 Minutes Intravenous Every 24 hours 06/16/22 0752     06/16/22 0900  vancomycin (VANCOCIN) IVPB 1000 mg/200 mL premix        1,000 mg 200 mL/hr over 60 Minutes Intravenous  Once 06/16/22 0752 06/16/22 0959   06/16/22 0800  piperacillin-tazobactam (ZOSYN) IVPB 3.375 g        3.375 g 12.5 mL/hr over 240 Minutes Intravenous Every 8 hours 06/16/22 0728     06/15/22 2100  cefTRIAXone (ROCEPHIN) 1 g in sodium chloride 0.9 % 100 mL IVPB  Status:  Discontinued        1 g 200 mL/hr over 30 Minutes Intravenous Every 24 hours 06/15/22 0335 06/16/22 0728   06/14/22 1845  cefTRIAXone (ROCEPHIN) 1 g in sodium chloride 0.9 % 100 mL IVPB        1 g 200 mL/hr over 30 Minutes Intravenous  Once 06/14/22 1832 06/14/22 2116        Objective: Vitals:   06/15/22 1141 06/15/22 2048 06/16/22 0500  06/16/22 0543  BP: 139/61 (!) 162/75  (!) 144/72  Pulse: 93 94  88  Resp: (!) '22 16  18  '$ Temp: 98.5 F (36.9 C) 97.7 F (36.5 C)  98.2 F (36.8 C)  TempSrc: Oral Oral  Oral  SpO2: 99% 98%  96%  Weight:   44.6 kg     Intake/Output Summary (Last 24 hours) at 06/16/2022 1041 Last data filed at 06/16/2022 0930 Gross per 24 hour  Intake 890.61 ml  Output 1400 ml  Net -509.39 ml    Filed Weights   06/16/22 0500  Weight: 44.6 kg    Examination: General exam: Appears calm and comfortable  Respiratory system: Clear to auscultation. Respiratory effort normal. Cardiovascular system: S1 & S2 heard, RRR. No pedal edema. Gastrointestinal system: Abdomen is nondistended, soft and nontender. Normal bowel sounds heard. Central nervous system: Alert and oriented to self  Extremities: right AKA  Skin:     Data Reviewed: I have personally reviewed following labs and imaging studies  CBC: Recent Labs  Lab 06/14/22 1548 06/14/22 2301 06/15/22 0121 06/16/22 0701  WBC 10.5 13.1* 14.2* 10.9*  NEUTROABS 6.8  --   --   --   HGB 10.9* 10.4* 10.5* 9.7*  HCT 34.4* 32.4* 33.6* 30.3*  MCV 88.9 87.8 89.4 86.3  PLT 436* 422* 426* 412*    Basic Metabolic Panel: Recent Labs  Lab 06/14/22 1548 06/14/22 2301 06/15/22 0121 06/16/22 0701  NA 135  --  129* 132*  K 3.9  --  3.8 3.5  CL 97*  --  94* 100  CO2 29  --  24 24  GLUCOSE 115*  --  137* 105*  BUN 24*  --  17 10  CREATININE 0.90 0.72 0.73 0.61  CALCIUM 8.9  --  8.6* 8.5*    GFR: Estimated Creatinine Clearance: 58.8 mL/min (by C-G formula based on SCr of 0.61 mg/dL). Liver Function Tests: Recent Labs  Lab 06/14/22 1548 06/15/22 0121  AST 32 37  ALT 23 25  ALKPHOS 91 91  BILITOT 0.3 0.5  PROT 7.8 7.7  ALBUMIN 2.8* 2.9*    No results for input(s): "LIPASE", "AMYLASE" in the last 168 hours. Recent Labs  Lab 06/14/22 1548 06/14/22 2301  AMMONIA 18 27    Coagulation Profile: No results for input(s): "INR",  "PROTIME" in the last 168 hours. Cardiac Enzymes: No results for input(s): "CKTOTAL", "CKMB", "CKMBINDEX", "TROPONINI" in the last 168 hours. BNP (last 3 results) No results for input(s): "PROBNP" in the last 8760 hours. HbA1C: No results for input(s): "HGBA1C" in the last 72 hours. CBG: Recent Labs  Lab 06/16/22 0724  GLUCAP 99   Lipid Profile: No results for input(s): "CHOL", "HDL", "LDLCALC", "TRIG", "CHOLHDL", "LDLDIRECT" in the last 72 hours. Thyroid Function Tests: No results for input(s): "TSH", "T4TOTAL", "FREET4", "T3FREE", "THYROIDAB" in the last 72 hours. Anemia Panel: No results for input(s): "VITAMINB12", "FOLATE", "FERRITIN", "TIBC", "IRON", "RETICCTPCT" in the last  72 hours. Sepsis Labs: Recent Labs  Lab 06/14/22 1548 06/14/22 1840 06/14/22 2056  LATICACIDVEN 2.4* 2.0* 1.0     Recent Results (from the past 240 hour(s))  Urine Culture     Status: None (Preliminary result)   Collection Time: 06/14/22  3:37 PM   Specimen: Urine, Clean Catch  Result Value Ref Range Status   Specimen Description   Final    URINE, CLEAN CATCH Performed at [facility], [place] [address], [place], [place] [address]    Special Requests   Final    NONE Performed at [facility], [place] [address] [place], [place] [address]    Culture   Final    CULTURE REINCUBATED FOR BETTER GROWTH Performed at [facility], [place] [address], [place], [place] [address]    Report Status PENDING  Incomplete      Radiology Studies: CT Head Wo Contrast  Result Date: 06/14/2022 CLINICAL DATA:  Mental status change, unknown cause EXAM: CT HEAD WITHOUT CONTRAST TECHNIQUE: Contiguous axial images were obtained from the base of the skull through the vertex without intravenous contrast. RADIATION DOSE REDUCTION: This exam was performed according to the departmental dose-optimization program which includes automated exposure control, adjustment of the mA and/or kV  according to patient size and/or use of iterative reconstruction technique. COMPARISON:  01/25/2022 FINDINGS: Brain: There is periventricular white matter decreased attenuation consistent with small vessel ischemic changes. Ventricles, sulci and cisterns are prominent consistent with age related involutional changes. No acute intracranial hemorrhage, mass effect or shift. No hydrocephalus. Left periventricular encephalomalacia consistent with an old lacunar CVA. Old lacunar CVA left cerebellum. Vascular: No hyperdense vessel or unexpected calcification. Skull: Normal. Negative for fracture or focal lesion. Sinuses/Orbits: No acute finding. IMPRESSION: Atrophy and chronic small vessel ischemic changes. Chronic lacunar CVAs. No acute intracranial process identified. Electronically Signed   By: Sammie Bench M.D.   On: 06/14/2022 17:28      Scheduled Meds:  aspirin EC  81 mg Oral Daily   atenolol  25 mg Oral Daily   busPIRone  10 mg Oral TID   clopidogrel  75 mg Oral Daily   heparin  5,000 Units Subcutaneous Q8H   phenytoin  100 mg Oral Daily   phenytoin  200 mg Oral QHS   pneumococcal 20-valent conjugate vaccine  0.5 mL Intramuscular Tomorrow-1000   pravastatin  80 mg Oral QHS   Continuous Infusions:  sodium chloride 100 mL/hr at 06/15/22 1124   piperacillin-tazobactam (ZOSYN)  IV 3.375 g (06/16/22 0909)   [START ON 06/17/2022] vancomycin       LOS: 2 days   Time spent: 25 minutes   Chris Phi, DO [facility] 06/16/2022, 10:41 AM   Available via Epic secure chat 7am-7pm After these hours, please refer to coverage provider listed on [email or web address]

## 2022-06-17 DIAGNOSIS — G9341 Metabolic encephalopathy: Secondary | ICD-10-CM | POA: Diagnosis not present

## 2022-06-17 LAB — CBC
HCT: 28.7 % — ABNORMAL LOW (ref 39.0–52.0)
Hemoglobin: 9.3 g/dL — ABNORMAL LOW (ref 13.0–17.0)
MCH: 28.1 pg (ref 26.0–34.0)
MCHC: 32.4 g/dL (ref 30.0–36.0)
MCV: 86.7 fL (ref 80.0–100.0)
Platelets: 414 10*3/uL — ABNORMAL HIGH (ref 150–400)
RBC: 3.31 MIL/uL — ABNORMAL LOW (ref 4.22–5.81)
RDW: 13 % (ref 11.5–15.5)
WBC: 10.9 10*3/uL — ABNORMAL HIGH (ref 4.0–10.5)
nRBC: 0 % (ref 0.0–0.2)

## 2022-06-17 LAB — BASIC METABOLIC PANEL
Anion gap: 9 (ref 5–15)
BUN: 13 mg/dL (ref 8–23)
CO2: 23 mmol/L (ref 22–32)
Calcium: 8.6 mg/dL — ABNORMAL LOW (ref 8.9–10.3)
Chloride: 101 mmol/L (ref 98–111)
Creatinine, Ser: 0.77 mg/dL (ref 0.61–1.24)
GFR, Estimated: >60 mL/min (ref >60)
Glucose, Bld: 109 mg/dL — ABNORMAL HIGH (ref 70–99)
Potassium: 3.5 mmol/L (ref 3.5–5.1)
Sodium: 133 mmol/L — ABNORMAL LOW (ref 135–145)

## 2022-06-17 NOTE — Progress Notes (Signed)
PROGRESS NOTE    NICOLIS BOODY  IOX:[id number] DOB: 1955/11/16 DOA: 06/14/2022 PCP: Jeanette Caprice, PA-C     Brief Narrative:  [name] is a 66 y.o. 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 Patient presents to ED from NH after family visited patient and noted that he was not his usual self. In addition there was question of a possible ground level fall, of note no evidence of fall or visible injury was noted per NH. Due to these issues, at family request patient was transferred to ED by EMS. Initially thought his AMS was due to UTI. He was started on rocephin. However, due to patient's chronic left heel wound, osteomyelitis was also considered. He underwent MRI 12/23 which revealed osteomyelitis.   New events last 24 hours / Subjective: Patient doing about the same.  No new issues overnight.  Afebrile.  Currently awaiting transfer to [facility] for vascular surgery eval  Assessment & Plan:   Principal Problem:   Acute metabolic encephalopathy Active Problems:   Hyponatremia   Seizures (HCC)   Hypertension   TBI (traumatic brain injury) ([place])   Depression   Hyperlipidemia   Dementia without behavioral disturbance (HCC)   PAD (peripheral artery disease) (HCC)   Complicated UTI (urinary tract infection)   UTI (urinary tract infection)   Chronic ulcer of left heel (HCC)   Acute metabolic encephalopathy, in setting of underlying TBI, cognitive memory loss -Could be secondary to infection, supratherapeutic phenytoin level -Hold neurontin, remeron for now   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  Trula Slade. No re-vascularization options noted from outpatient note -Aspirin, plavix  -CRP 17.4, sed rate 100 -Vancomycin, Zosyn -MRI positive for posterior heel ulcer with underlying osteomyelitis. Requested vascular surgery consult. Transfer [facility] to [facility].   UTI ruled out  -Urine culture only showing 1000 colonies corynebacterium species   Seizure disorder Supratherapeutic phenytoin level -Phenytoin level normalized.  Decreased dose to resume at '100mg'$  in AM and '200mg'$  qhs  Hyponatremia -IVF -Improved   Elevated D Dimer -No clinical sign of PE, remains stable on room air   HTN -Atenolol   HLD -Pravachol     DVT prophylaxis:  heparin injection 5,000 Units Start: 06/14/22 2230  Code Status: Full Family Communication: Sister at bedside  Disposition Plan:  Status is: Inpatient Remains inpatient appropriate because: Transfer to [facility] for vascular surgery eval    Antimicrobials:  Anti-infectives (From admission, onward)    Start     Dose/Rate Route Frequency Ordered Stop   06/17/22 1000  vancomycin (VANCOREADY) IVPB 750 mg/150 mL        750 mg 150 mL/hr over 60 Minutes Intravenous Every 24 hours 06/16/22 0752     06/16/22 0900  vancomycin (VANCOCIN) IVPB 1000 mg/200 mL premix        1,000 mg 200 mL/hr over 60 Minutes Intravenous  Once 06/16/22 0752 06/16/22 0959   06/16/22 0800  piperacillin-tazobactam (ZOSYN) IVPB 3.375 g        3.375 g 12.5 mL/hr over 240 Minutes Intravenous Every 8 hours 06/16/22 0728     06/15/22 2100  cefTRIAXone (ROCEPHIN) 1 g in sodium chloride  0.9 % 100 mL IVPB  Status:  Discontinued        1 g 200 mL/hr over 30 Minutes Intravenous Every 24 hours 06/15/22 0335 06/16/22 0728   06/14/22 1845  cefTRIAXone (ROCEPHIN) 1 g in sodium chloride 0.9 % 100 mL IVPB        1 g 200 mL/hr over 30 Minutes Intravenous  Once 06/14/22 1832 06/14/22 2116        Objective: Vitals:   06/16/22 0543 06/16/22 1413 06/16/22 2103 06/17/22 0642  BP: (!)  144/72 (!) 147/83 (!) 145/76 129/60  Pulse: 88 90 91 79  Resp: '18 16 20 16  '$ Temp: 98.2 F (36.8 C) 98.3 F (36.8 C) 98.5 F (36.9 C) 99.6 F (37.6 C)  TempSrc: Oral Oral Oral Oral  SpO2: 96% 100% 98%   Weight:        Intake/Output Summary (Last 24 hours) at 06/17/2022 0919 Last data filed at 06/17/2022 0452 Gross per 24 hour  Intake 2338.12 ml  Output 150 ml  Net 2188.12 ml    Filed Weights   06/16/22 0500  Weight: 44.6 kg    Examination: General exam: Appears calm and comfortable  Respiratory system: Clear to auscultation. Respiratory effort normal. Cardiovascular system: S1 & S2 heard, RRR. No pedal edema. Gastrointestinal system: Abdomen is nondistended, soft and nontender. Normal bowel sounds heard. Extremities: right AKA   Data Reviewed: I have personally reviewed following labs and imaging studies  CBC: Recent Labs  Lab 06/14/22 1548 06/14/22 2301 06/15/22 0121 06/16/22 0701 06/17/22 0545  WBC 10.5 13.1* 14.2* 10.9* 10.9*  NEUTROABS 6.8  --   --   --   --   HGB 10.9* 10.4* 10.5* 9.7* 9.3*  HCT 34.4* 32.4* 33.6* 30.3* 28.7*  MCV 88.9 87.8 89.4 86.3 86.7  PLT 436* 422* 426* 412* 414*    Basic Metabolic Panel: Recent Labs  Lab 06/14/22 1548 06/14/22 2301 06/15/22 0121 06/16/22 0701 06/17/22 0545  NA 135  --  129* 132* 133*  K 3.9  --  3.8 3.5 3.5  CL 97*  --  94* 100 101  CO2 29  --  '24 24 23  '$ GLUCOSE 115*  --  137* 105* 109*  BUN 24*  --  '17 10 13  '$ CREATININE 0.90 0.72 0.73 0.61 0.77  CALCIUM 8.9  --  8.6* 8.5* 8.6*    GFR: Estimated Creatinine Clearance: 58.8 mL/min (by C-G formula based on SCr of 0.77 mg/dL). Liver Function Tests: Recent Labs  Lab 06/14/22 1548 06/15/22 0121  AST 32 37  ALT 23 25  ALKPHOS 91 91  BILITOT 0.3 0.5  PROT 7.8 7.7  ALBUMIN 2.8* 2.9*    No results for input(s): "LIPASE", "AMYLASE" in the last 168 hours. Recent Labs  Lab 06/14/22 1548 06/14/22 2301  AMMONIA 18 27    Coagulation Profile: No  results for input(s): "INR", "PROTIME" in the last 168 hours. Cardiac Enzymes: No results for input(s): "CKTOTAL", "CKMB", "CKMBINDEX", "TROPONINI" in the last 168 hours. BNP (last 3 results) No results for input(s): "PROBNP" in the last 8760 hours. HbA1C: No results for input(s): "HGBA1C" in the last 72 hours. CBG: Recent Labs  Lab 06/16/22 0724  GLUCAP 99    Lipid Profile: No results for input(s): "CHOL", "HDL", "LDLCALC", "TRIG", "CHOLHDL", "LDLDIRECT" in the last 72 hours. Thyroid Function Tests: No results for input(s): "TSH", "T4TOTAL", "FREET4", "T3FREE", "THYROIDAB" in the last 72 hours. Anemia Panel: No results for input(s): "VITAMINB12", "FOLATE", "FERRITIN", "TIBC", "IRON", "RETICCTPCT"  in the last 72 hours. Sepsis Labs: Recent Labs  Lab 06/14/22 1548 06/14/22 1840 06/14/22 2056  LATICACIDVEN 2.4* 2.0* 1.0     Recent Results (from the past 240 hour(s))  Urine Culture     Status: Abnormal   Collection Time: 06/14/22  3:37 PM   Specimen: Urine, Clean Catch  Result Value Ref Range Status   Specimen Description   Final    URINE, CLEAN CATCH Performed at [facility], [place] [address], [place], [place] [address]    Special Requests   Final    NONE Performed at [facility], [place] [address], [place], [place] [address]    Culture (A)  Final    1,000 COLONIES/mL DIPHTHEROIDS(CORYNEBACTERIUM SPECIES) Standardized susceptibility testing for this organism is not available. Performed at [facility], [place] [address], [place], [place] [address]    Report Status 06/16/2022 FINAL  Final      Radiology Studies: MR HEEL LEFT WO CONTRAST  Result Date: 06/16/2022 CLINICAL DATA:  Osteomyelitis suspected, foot, no prior imaging EXAM: MR OF THE LEFT HEEL WITHOUT CONTRAST TECHNIQUE: Multiplanar, multisequence MR imaging of the left heel was performed. No intravenous contrast was administered. COMPARISON:  Left foot radiograph  06/18/2021 FINDINGS: Extensive motion artifact degrades image quality. Bones/Joint/Cartilage There is subcortical marrow edema and mildly low T1 signal within the posterior calcaneus (sagittal stir image 16, axial T1 image 23). There is marrow edema within the lateral malleolus, with preserved T1 marrow signal. There is no tibiotalar or subtalar joint effusion. There is mild tibiotalar and posterior subtalar osteoarthritis with mild periarticular subchondral marrow edema. There is periarticular marrow edema at the tarsometatarsal joints related to arthro arthritis. Ligaments Poorly evaluated due to motion artifact. Muscles and Tendons Poorly evaluated due to extensive motion artifact. No obvious tendon tear or significant tenosynovitis. Soft tissues There is a posterior heel soft tissue ulcer, extending to the surface of the posterior calcaneus. No evidence of soft tissue abscess. IMPRESSION: Extensive motion artifact degrades image quality. Posterior heel soft tissue ulcer with underlying osteomyelitis of the posterior calcaneus. No evidence of soft tissue abscess. Focal marrow edema within the lateral malleolus, with preserved T1 marrow signal. This is nonspecific and could reflect reactive marrow change or early osteomyelitis if there is adjacent lateral soft tissue ulcer, correlate with exam. This could also be posttraumatic. Mild tibiotalar and posterior subtalar osteoarthritis. No significant joint effusion. 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 Electronically Signed   By: Maurine Simmering M.D.   On: 06/16/2022 12:12      Scheduled Meds:  aspirin EC  81 mg Oral Daily   atenolol  25 mg Oral Daily   busPIRone  10 mg Oral TID   clopidogrel  75 mg Oral Daily   heparin  5,000 Units Subcutaneous Q8H   phenytoin  100 mg Oral Daily   phenytoin  200 mg Oral QHS   pneumococcal 20-valent conjugate vaccine  0.5 mL Intramuscular Tomorrow-1000   pravastatin  80 mg Oral QHS   Continuous  Infusions:  sodium chloride 100 mL/hr at 06/15/22 1124   piperacillin-tazobactam (ZOSYN)  IV 3.375 g (06/17/22 0840)   vancomycin       LOS: 3 days   Time spent: 25 minutes   Dessa Phi, DO [facility] 06/17/2022, 9:19 AM   Available via Epic secure chat 7am-7pm After these hours, please refer to coverage provider listed on [email or web address]

## 2022-06-17 NOTE — Progress Notes (Signed)
Patient was transferred to [facility] via [place] - [address] [place] Report was given to the receiving nurse at [facility]

## 2022-06-18 DIAGNOSIS — I96 Gangrene, not elsewhere classified: Secondary | ICD-10-CM

## 2022-06-18 DIAGNOSIS — G40909 Epilepsy, unspecified, not intractable, without status epilepticus: Secondary | ICD-10-CM

## 2022-06-18 DIAGNOSIS — G9341 Metabolic encephalopathy: Secondary | ICD-10-CM | POA: Diagnosis not present

## 2022-06-18 DIAGNOSIS — T8781 Dehiscence of amputation stump: Secondary | ICD-10-CM | POA: Diagnosis not present

## 2022-06-18 DIAGNOSIS — M86172 Other acute osteomyelitis, left ankle and foot: Secondary | ICD-10-CM | POA: Diagnosis not present

## 2022-06-18 LAB — CBC
HCT: 31.9 % — ABNORMAL LOW (ref 39.0–52.0)
Hemoglobin: 10.3 g/dL — ABNORMAL LOW (ref 13.0–17.0)
MCH: 28.1 pg (ref 26.0–34.0)
MCHC: 32.3 g/dL (ref 30.0–36.0)
MCV: 87.2 fL (ref 80.0–100.0)
Platelets: 465 10*3/uL — ABNORMAL HIGH (ref 150–400)
RBC: 3.66 MIL/uL — ABNORMAL LOW (ref 4.22–5.81)
RDW: 12.9 % (ref 11.5–15.5)
WBC: 11.8 10*3/uL — ABNORMAL HIGH (ref 4.0–10.5)
nRBC: 0 % (ref 0.0–0.2)

## 2022-06-18 LAB — BASIC METABOLIC PANEL
Anion gap: 14 (ref 5–15)
BUN: 8 mg/dL (ref 8–23)
CO2: 20 mmol/L — ABNORMAL LOW (ref 22–32)
Calcium: 8.5 mg/dL — ABNORMAL LOW (ref 8.9–10.3)
Chloride: 100 mmol/L (ref 98–111)
Creatinine, Ser: 0.77 mg/dL (ref 0.61–1.24)
GFR, Estimated: >60 mL/min (ref >60)
Glucose, Bld: 114 mg/dL — ABNORMAL HIGH (ref 70–99)
Potassium: 3.7 mmol/L (ref 3.5–5.1)
Sodium: 134 mmol/L — ABNORMAL LOW (ref 135–145)

## 2022-06-18 LAB — GLUCOSE, CAPILLARY: Glucose-Capillary: 102 mg/dL — ABNORMAL HIGH (ref 70–99)

## 2022-06-18 MED ORDER — IBUPROFEN 200 MG PO TABS
400.0000 mg | ORAL_TABLET | Freq: Once | ORAL | Status: AC | PRN
  Administered 2022-06-18: 400 mg via ORAL
  Filled 2022-06-18 (×2): qty 2

## 2022-06-18 MED ORDER — OXYCODONE HCL 5 MG PO TABS: 5.0000 mg | ORAL_TABLET | Freq: Once | ORAL | Status: DC | PRN

## 2022-06-18 MED ORDER — SODIUM CHLORIDE 0.9 % IV SOLN: INTRAVENOUS | Status: AC

## 2022-06-18 NOTE — Consult Note (Addendum)
ASSESSMENT & PLAN   GANGRENE LEFT HEEL/OSTEO: This patient has undergone a previous arteriogram and was not feel to be a candidate for revascularization.  He has a knee contracture and the only option on the left is an above-the-knee amputation.  I discussed this with the patient and his sister.  She is coming to the hospital and I will also discussed this with her in person.  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  REASON FOR CONSULT:    Gangrenous wound of the left heel.   HPI:   [name] is a 66 y.o. male who presents with a gangrenous wound of the left heel.  He is undergone a previous arteriogram which showed he was not a candidate for revascularization.  He was transferred to [facility] to discuss amputation.  He has a right AKA.  He is nonambulatory.  He has a contracture of the left knee.  Past Medical History:  Diagnosis Date   AAA (abdominal aortic aneurysm) ([place])    Alcohol abuse    h/o, quit april 5th, 2013   Anxiety    Aortic aneurysm ([place])    Blood transfusion    Cardiomegaly    EF 25-30 % in 2005, EF 50-55% Oct 2013   Depression    Hyperlipidemia    Hypertension    Lumbar herniated disc    Memory deficits    Radiculopathy    Seizures ([place])    last seizure several years ago- per sister 72 or below at [facility] 07-12-17   TBI (traumatic brain injury) [facility])    Sept 1988   Urinary urgency     Family History  Problem Relation Age of Onset   Liver disease Mother    Hypertension Mother    Breast cancer Maternal Aunt    Esophageal cancer Cousin    Esophageal cancer Maternal Uncle    Colon cancer Neg Hx    Stomach cancer Neg Hx    Colon polyps Neg Hx    Rectal cancer Neg Hx     SOCIAL HISTORY: Social History   Tobacco Use   Smoking status: Former    Packs/day: 0.10    Types: Cigarettes    Passive exposure: Never   Smokeless tobacco: Never    Tobacco comments:    "as much as he can"- per Med Tech at [employer] 07/01/19  Substance Use Topics   Alcohol use: No    Alcohol/week: 0.0 standard drinks of alcohol    Comment: per sister, last drink was apr 5th--2013 hx of ETOH    Allergies  Allergen Reactions   Isovue [Iopamidol] Hives    Pt broke out in one hive on his chest after contrast on 01/03/15.  Pt will need full premeds in the future per Dr Martinique.      Current Facility-Administered Medications  Medication Dose Route Frequency Provider Last Rate Last Admin   0.9 %  sodium chloride infusion   Intravenous Continuous Dessa Phi, DO 75 mL/hr at 06/18/22 0314 New Bag at 06/18/22 0314   acetaminophen (TYLENOL) tablet 650 mg  650 mg Oral Q6H PRN Clance Boll, MD   650 mg at 06/18/22 6659   Or   acetaminophen (TYLENOL) suppository 650 mg  650 mg Rectal Q6H PRN Clance Boll, MD       albuterol (PROVENTIL) (2.5 MG/3ML) 0.083% nebulizer  solution 2.5 mg  2.5 mg Nebulization Q2H PRN Clance Boll, MD       aspirin EC tablet 81 mg  81 mg Oral Daily Dessa Phi, DO   81 mg at 06/17/22 0853   atenolol (TENORMIN) tablet 25 mg  25 mg Oral Daily Dessa Phi, DO   25 mg at 06/17/22 0853   busPIRone (BUSPAR) tablet 10 mg  10 mg Oral TID Dessa Phi, DO   10 mg at 06/17/22 2235   clopidogrel (PLAVIX) tablet 75 mg  75 mg Oral Daily Dessa Phi, DO   75 mg at 06/17/22 1324   haloperidol lactate (HALDOL) injection 2 mg  2 mg Intravenous Q6H PRN Dessa Phi, DO   2 mg at 06/17/22 1408   heparin injection 5,000 Units  5,000 Units Subcutaneous Q8H Myles Rosenthal A, MD   5,000 Units at 06/17/22 2333   ibuprofen (ADVIL) tablet 400 mg  400 mg Oral Once PRN Shela Leff, MD       ondansetron [facility]) tablet 4 mg  4 mg Oral Q6H PRN Clance Boll, MD       Or   ondansetron [facility]) injection 4 mg  4 mg Intravenous Q6H PRN Clance Boll, MD       phenytoin (DILANTIN) chewable tablet 100 mg  100 mg Oral  Daily Dessa Phi, DO   100 mg at 06/17/22 4010   phenytoin (DILANTIN) chewable tablet 200 mg  200 mg Oral QHS Dessa Phi, DO   200 mg at 06/17/22 2333   piperacillin-tazobactam (ZOSYN) IVPB 3.375 g  3.375 g Intravenous Q8H Dessa Phi, DO 12.5 mL/hr at 06/17/22 2333 3.375 g at 06/17/22 2333   pravastatin (PRAVACHOL) tablet 80 mg  80 mg Oral QHS Dessa Phi, DO   80 mg at 06/17/22 2235   vancomycin (VANCOREADY) IVPB 750 mg/150 mL  750 mg Intravenous Q24H Suzzanne Cloud, RPH 150 mL/hr at 06/17/22 1051 750 mg at 06/17/22 1051    REVIEW OF SYSTEMS:  '[X]'$  denotes positive finding, '[ ]'$  denotes negative finding Cardiac  Comments:  Chest pain or chest pressure:    Shortness of breath upon exertion:    Short of breath when lying flat:    Irregular heart rhythm:        Vascular    Pain in calf, thigh, or hip brought on by ambulation:    Pain in feet at night that wakes you up from your sleep:     Blood clot in your veins:    Leg swelling:         Pulmonary    Oxygen at home:    Productive cough:     Wheezing:         Neurologic    Sudden weakness in arms or legs:     Sudden numbness in arms or legs:     Sudden onset of difficulty speaking or slurred speech:    Temporary loss of vision in one eye:     Problems with dizziness:         Gastrointestinal    Blood in stool:     Vomited blood:         Genitourinary    Burning when urinating:     Blood in urine:        Psychiatric    Major depression:         Hematologic    Bleeding problems:    Problems with blood clotting too easily:  Skin    Rashes or ulcers:        Constitutional    Fever or chills:    -  PHYSICAL EXAM:   Vitals:   06/17/22 2320 06/18/22 0346 06/18/22 0532 06/18/22 0740  BP: (!) 164/83 (!) 131/103  (!) 169/83  Pulse: 94 91  88  Resp: '16 18  17  '$ Temp: 100.3 F (37.9 C) 98.8 F (37.1 C)  98.6 F (37 C)  TempSrc: Oral Oral  Oral  SpO2: 100% 98%    Weight:   43 kg    Body mass  index is 14.41 kg/m. GENERAL: The patient is a well-nourished male, in no acute distress. The vital signs are documented above. CARDIAC: There is a regular rate and rhythm.  VASCULAR: He has bilateral carotid bruits. He has diminished femoral pulses. He has exposed bone on his right AKA.  PULMONARY: There is good air exchange bilaterally without wheezing or rales. ABDOMEN: Soft and non-tender with normal pitched bowel sounds.  MUSCULOSKELETAL: He has a right AKA with exposed bone. NEUROLOGIC: No focal weakness or paresthesias are detected. SKIN: He has an extensive decubitus on his left heel.    DATA:    MRI LEFT HEEL: This shows osteomyelitis of the posterior calcaneus.  LABS: His white blood cell count is 11.8.  Hemoglobin 10.3.  Hematocrit 31.9.  Platelets 165,000.  Potassium is 3.7.  Deitra Mayo [facility]

## 2022-06-18 NOTE — Anesthesia Preprocedure Evaluation (Addendum)
Anesthesia Evaluation  Patient identified by MRN, date of birth, ID band Patient confused  General Assessment Comment: Awake    Reviewed: Allergy & Precautions, NPO status , Patient's Chart, lab work & pertinent test results, reviewed documented beta blocker date and time   History of Anesthesia Complications Negative for: history of anesthetic complications  Airway Mallampati: II  TM Distance: >3 FB Neck ROM: Limited    Dental  (+) Edentulous Lower, Edentulous Upper   Pulmonary former smoker   Pulmonary exam normal        Cardiovascular hypertension, Pt. on home beta blockers and Pt. on medications + Peripheral Vascular Disease  Normal cardiovascular exam   Hx TEVAR    Neuro/Psych Seizures -, Well Controlled,  PSYCHIATRIC DISORDERS Anxiety Depression   Dementia  TBI Memory deficits     GI/Hepatic negative GI ROS,,,(+)     substance abuse  alcohol use  Endo/Other  negative endocrine ROS    Renal/GU negative Renal ROS     Musculoskeletal negative musculoskeletal ROS (+)    Abdominal   Peds  Hematology  (+) Blood dyscrasia, anemia  On plavix    Anesthesia Other Findings   Reproductive/Obstetrics                             Anesthesia Physical Anesthesia Plan  ASA: 4  Anesthesia Plan: General   Post-op Pain Management: Tylenol PO (pre-op)* and Dilaudid IV   Induction: Intravenous  PONV Risk Score and Plan: 2 and Treatment may vary due to age or medical condition, Dexamethasone and Propofol infusion  Airway Management Planned: LMA  Additional Equipment: None  Intra-op Plan:   Post-operative Plan: Extubation in OR  Informed Consent: I have reviewed the patients History and Physical, chart, labs and discussed the procedure including the risks, benefits and alternatives for the proposed anesthesia with the patient or authorized representative who has indicated his/her  understanding and acceptance.     Dental advisory given and Consent reviewed with POA  Plan Discussed with: CRNA and Anesthesiologist  Anesthesia Plan Comments: (Consent obtained from sister at bedside )       Anesthesia Quick Evaluation

## 2022-06-18 NOTE — H&P (View-Only) (Signed)
ASSESSMENT & PLAN   GANGRENE LEFT HEEL/OSTEO: This patient has undergone a previous arteriogram and was not feel to be a candidate for revascularization.  He has a knee contracture and the only option on the left is an above-the-knee amputation.  I discussed this with the patient and his sister.  She is coming to the hospital and I will also discussed this with her in person.  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  REASON FOR CONSULT:    Gangrenous wound of the left heel.   HPI:   [name] is a 66 y.o. male who presents with a gangrenous wound of the left heel.  He is undergone a previous arteriogram which showed he was not a candidate for revascularization.  He was transferred to [facility] to discuss amputation.  He has a right AKA.  He is nonambulatory.  He has a contracture of the left knee.  Past Medical History:  Diagnosis Date   AAA (abdominal aortic aneurysm) ([place])    Alcohol abuse    h/o, quit april 5th, 2013   Anxiety    Aortic aneurysm ([place])    Blood transfusion    Cardiomegaly    EF 25-30 % in 2005, EF 50-55% Oct 2013   Depression    Hyperlipidemia    Hypertension    Lumbar herniated disc    Memory deficits    Radiculopathy    Seizures ([place])    last seizure several years ago- per sister 36 or below at [facility] 07-12-17   TBI (traumatic brain injury) [facility])    Sept 1988   Urinary urgency     Family History  Problem Relation Age of Onset   Liver disease Mother    Hypertension Mother    Breast cancer Maternal Aunt    Esophageal cancer Cousin    Esophageal cancer Maternal Uncle    Colon cancer Neg Hx    Stomach cancer Neg Hx    Colon polyps Neg Hx    Rectal cancer Neg Hx     SOCIAL HISTORY: Social History   Tobacco Use   Smoking status: Former    Packs/day: 0.10    Types: Cigarettes    Passive exposure: Never   Smokeless tobacco: Never    Tobacco comments:    "as much as he can"- per Med Tech at [employer] 07/01/19  Substance Use Topics   Alcohol use: No    Alcohol/week: 0.0 standard drinks of alcohol    Comment: per sister, last drink was apr 5th--2013 hx of ETOH    Allergies  Allergen Reactions   Isovue [Iopamidol] Hives    Pt broke out in one hive on his chest after contrast on 01/03/15.  Pt will need full premeds in the future per Dr Martinique.      Current Facility-Administered Medications  Medication Dose Route Frequency Provider Last Rate Last Admin   0.9 %  sodium chloride infusion   Intravenous Continuous Dessa Phi, DO 75 mL/hr at 06/18/22 0314 New Bag at 06/18/22 0314   acetaminophen (TYLENOL) tablet 650 mg  650 mg Oral Q6H PRN Clance Boll, MD   650 mg at 06/18/22 6295   Or   acetaminophen (TYLENOL) suppository 650 mg  650 mg Rectal Q6H PRN Clance Boll, MD       albuterol (PROVENTIL) (2.5 MG/3ML) 0.083% nebulizer  solution 2.5 mg  2.5 mg Nebulization Q2H PRN Clance Boll, MD       aspirin EC tablet 81 mg  81 mg Oral Daily Dessa Phi, DO   81 mg at 06/17/22 0853   atenolol (TENORMIN) tablet 25 mg  25 mg Oral Daily Dessa Phi, DO   25 mg at 06/17/22 0853   busPIRone (BUSPAR) tablet 10 mg  10 mg Oral TID Dessa Phi, DO   10 mg at 06/17/22 2235   clopidogrel (PLAVIX) tablet 75 mg  75 mg Oral Daily Dessa Phi, DO   75 mg at 06/17/22 2563   haloperidol lactate (HALDOL) injection 2 mg  2 mg Intravenous Q6H PRN Dessa Phi, DO   2 mg at 06/17/22 1408   heparin injection 5,000 Units  5,000 Units Subcutaneous Q8H Myles Rosenthal A, MD   5,000 Units at 06/17/22 2333   ibuprofen (ADVIL) tablet 400 mg  400 mg Oral Once PRN Shela Leff, MD       ondansetron [facility]) tablet 4 mg  4 mg Oral Q6H PRN Clance Boll, MD       Or   ondansetron [facility]) injection 4 mg  4 mg Intravenous Q6H PRN Clance Boll, MD       phenytoin (DILANTIN) chewable tablet 100 mg  100 mg Oral  Daily Dessa Phi, DO   100 mg at 06/17/22 8937   phenytoin (DILANTIN) chewable tablet 200 mg  200 mg Oral QHS Dessa Phi, DO   200 mg at 06/17/22 2333   piperacillin-tazobactam (ZOSYN) IVPB 3.375 g  3.375 g Intravenous Q8H Dessa Phi, DO 12.5 mL/hr at 06/17/22 2333 3.375 g at 06/17/22 2333   pravastatin (PRAVACHOL) tablet 80 mg  80 mg Oral QHS Dessa Phi, DO   80 mg at 06/17/22 2235   vancomycin (VANCOREADY) IVPB 750 mg/150 mL  750 mg Intravenous Q24H Suzzanne Cloud, RPH 150 mL/hr at 06/17/22 1051 750 mg at 06/17/22 1051    REVIEW OF SYSTEMS:  '[X]'$  denotes positive finding, '[ ]'$  denotes negative finding Cardiac  Comments:  Chest pain or chest pressure:    Shortness of breath upon exertion:    Short of breath when lying flat:    Irregular heart rhythm:        Vascular    Pain in calf, thigh, or hip brought on by ambulation:    Pain in feet at night that wakes you up from your sleep:     Blood clot in your veins:    Leg swelling:         Pulmonary    Oxygen at home:    Productive cough:     Wheezing:         Neurologic    Sudden weakness in arms or legs:     Sudden numbness in arms or legs:     Sudden onset of difficulty speaking or slurred speech:    Temporary loss of vision in one eye:     Problems with dizziness:         Gastrointestinal    Blood in stool:     Vomited blood:         Genitourinary    Burning when urinating:     Blood in urine:        Psychiatric    Major depression:         Hematologic    Bleeding problems:    Problems with blood clotting too easily:  Skin    Rashes or ulcers:        Constitutional    Fever or chills:    -  PHYSICAL EXAM:   Vitals:   06/17/22 2320 06/18/22 0346 06/18/22 0532 06/18/22 0740  BP: (!) 164/83 (!) 131/103  (!) 169/83  Pulse: 94 91  88  Resp: '16 18  17  '$ Temp: 100.3 F (37.9 C) 98.8 F (37.1 C)  98.6 F (37 C)  TempSrc: Oral Oral  Oral  SpO2: 100% 98%    Weight:   43 kg    Body mass  index is 14.41 kg/m. GENERAL: The patient is a well-nourished male, in no acute distress. The vital signs are documented above. CARDIAC: There is a regular rate and rhythm.  VASCULAR: He has bilateral carotid bruits. He has diminished femoral pulses. He has exposed bone on his right AKA.  PULMONARY: There is good air exchange bilaterally without wheezing or rales. ABDOMEN: Soft and non-tender with normal pitched bowel sounds.  MUSCULOSKELETAL: He has a right AKA with exposed bone. NEUROLOGIC: No focal weakness or paresthesias are detected. SKIN: He has an extensive decubitus on his left heel.    DATA:    MRI LEFT HEEL: This shows osteomyelitis of the posterior calcaneus.  LABS: His white blood cell count is 11.8.  Hemoglobin 10.3.  Hematocrit 31.9.  Platelets 165,000.  Potassium is 3.7.  Deitra Mayo [facility]

## 2022-06-18 NOTE — Progress Notes (Addendum)
PROGRESS NOTE    [name]  CZY:[id number] DOB: 12-Apr-1956 DOA: 06/14/2022 PCP: Jeanette Caprice, PA-C     Brief Narrative:  [name] is a 66 y.o. 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 Patient presents to ED from NH after family visited patient and noted that he was not his usual self. In addition there was question of a possible ground level fall, of note no evidence of fall or visible injury was noted per NH. Due to these issues, at family request patient was transferred to ED by EMS. Initially thought his AMS was due to UTI. He was started on rocephin. However, due to patient's chronic left heel wound, osteomyelitis was also considered. He underwent MRI 12/23 which revealed osteomyelitis.   Subjective: Complains of pain in the left foot area.  Denies any nausea or vomiting.  No abdominal pain.  Assessment & Plan:   Acute metabolic encephalopathy, in setting of underlying TBI, cognitive memory loss Could be secondary to infection, supratherapeutic phenytoin level. Neurontin and Remeron were placed on hold. Mentation appears to have improved.  Phenytoin dose was decreased.  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 Trula Slade. No re-vascularization options noted from outpatient note -Aspirin, plavix  -CRP 17.4, sed rate 100 -Vancomycin, Zosyn -MRI positive for posterior heel ulcer with underlying osteomyelitis.  Seen by vascular surgery.  Patient will need above-knee amputation.  Vascular surgery to discuss further with patient's sister.   Continue with broad-spectrum antibiotics for now.  Follow-up on  cultures.    UTI ruled out  -Urine culture only showing 1000 colonies corynebacterium species   Seizure disorder/Supratherapeutic phenytoin level -Phenytoin level normalized.  Decreased dose to resume at '100mg'$  in AM and '200mg'$  qhs  Hyponatremia Improved.  Noted to be on normal saline infusion.  Cut back on the rate.  Normocytic anemia Hemoglobin stable.  No evidence for overt bleeding.  Likely anemia of chronic disease.  Elevated D Dimer -No clinical sign of PE, remains stable on room air   Essential hypertension High readings noted.  Noted to be on atenolol which is being continued.  Hyperlipidemia  -Pravachol     DVT prophylaxis: Subcutaneous heparin Code Status: Full Family Communication: Sister at bedside  Disposition Plan: Here from skilled nursing facility.  Anticipate discharge back to SNF when medically improved.  Status is: Inpatient Remains inpatient appropriate because: Transfer to [facility] for vascular surgery eval    Antimicrobials:  Anti-infectives (From admission, onward)    Start     Dose/Rate Route Frequency Ordered Stop   06/17/22 1000  vancomycin (VANCOREADY) IVPB 750 mg/150 mL        750 mg 150 mL/hr over 60 Minutes Intravenous Every 24 hours 06/16/22 0752     06/16/22 0900  vancomycin (VANCOCIN) IVPB 1000 mg/200 mL premix        1,000 mg 200 mL/hr over 60 Minutes Intravenous  Once 06/16/22 0752 06/16/22 0959   06/16/22 0800  piperacillin-tazobactam (ZOSYN) IVPB 3.375 g        3.375 g 12.5 mL/hr over 240 Minutes Intravenous Every 8 hours 06/16/22 0728     06/15/22 2100  cefTRIAXone (ROCEPHIN) 1 g in sodium  chloride 0.9 % 100 mL IVPB  Status:  Discontinued        1 g 200 mL/hr over 30 Minutes Intravenous Every 24 hours 06/15/22 0335 06/16/22 0728   06/14/22 1845  cefTRIAXone (ROCEPHIN) 1 g in sodium chloride 0.9 % 100 mL IVPB        1 g 200 mL/hr over 30 Minutes Intravenous  Once 06/14/22 1832 06/14/22 2116        Objective: Vitals:   06/17/22  2320 06/18/22 0346 06/18/22 0532 06/18/22 0740  BP: (!) 164/83 (!) 131/103  (!) 169/83  Pulse: 94 91  88  Resp: '16 18  17  '$ Temp: 100.3 F (37.9 C) 98.8 F (37.1 C)  98.6 F (37 C)  TempSrc: Oral Oral  Oral  SpO2: 100% 98%  98%  Weight:   43 kg     Intake/Output Summary (Last 24 hours) at 06/18/2022 0955 Last data filed at 06/18/2022 0200 Gross per 24 hour  Intake 974 ml  Output 1251 ml  Net -277 ml    Filed Weights   06/16/22 0500 06/17/22 2055 06/18/22 0532  Weight: 44.6 kg 43 kg 43 kg    Examination:  General appearance: Awake alert.  In no distress Resp: Clear to auscultation bilaterally.  Normal effort Cardio: S1-S2 is normal regular.  No S3-S4.  No rubs murmurs or bruit GI: Abdomen is soft.  Nontender nondistended.  Bowel sounds are present normal.  No masses organomegaly Extremities: Gangrenous area noted over the left heel.  Exposed bone over the right AKA.  Pulses very poorly palpable over the left foot.    Data Reviewed: I have personally reviewed following labs and imaging studies  CBC: Recent Labs  Lab 06/14/22 1548 06/14/22 2301 06/15/22 0121 06/16/22 0701 06/17/22 0545 06/18/22 0304  WBC 10.5 13.1* 14.2* 10.9* 10.9* 11.8*  NEUTROABS 6.8  --   --   --   --   --   HGB 10.9* 10.4* 10.5* 9.7* 9.3* 10.3*  HCT 34.4* 32.4* 33.6* 30.3* 28.7* 31.9*  MCV 88.9 87.8 89.4 86.3 86.7 87.2  PLT 436* 422* 426* 412* 414* 465*    Basic Metabolic Panel: Recent Labs  Lab 06/14/22 1548 06/14/22 2301 06/15/22 0121 06/16/22 0701 06/17/22 0545 06/18/22 0304  NA 135  --  129* 132* 133* 134*  K 3.9  --  3.8 3.5 3.5 3.7  CL 97*  --  94* 100 101 100  CO2 29  --  '24 24 23 '$ 20*  GLUCOSE 115*  --  137* 105* 109* 114*  BUN 24*  --  '17 10 13 8  '$ CREATININE 0.90 0.72 0.73 0.61 0.77 0.77  CALCIUM 8.9  --  8.6* 8.5* 8.6* 8.5*    GFR: Estimated Creatinine Clearance: 56.7 mL/min (by C-G formula based on SCr of 0.77 mg/dL). Liver Function Tests: Recent Labs  Lab  06/14/22 1548 06/15/22 0121  AST 32 37  ALT 23 25  ALKPHOS 91 91  BILITOT 0.3 0.5  PROT 7.8 7.7  ALBUMIN 2.8* 2.9*     Recent Labs  Lab 06/14/22 1548 06/14/22 2301  AMMONIA 18 27    CBG: Recent Labs  Lab 06/16/22 0724 06/18/22 0812  GLUCAP 99 102*     Sepsis Labs: Recent Labs  Lab 06/14/22 1548 06/14/22 1840 06/14/22 2056  LATICACIDVEN 2.4* 2.0* 1.0     Recent Results (from the past 240 hour(s))  Urine Culture     Status: Abnormal (Preliminary result)   Collection Time: 06/14/22  3:37 PM   Specimen: Urine, Clean Catch  Result Value Ref Range Status   Specimen Description   Final    URINE, CLEAN CATCH Performed at [facility], [place] [address] [place], [place] [address]    Special Requests   Final    NONE Performed at [facility], [place] [address], [place], [place] [address]    Culture (A)  Final    1,000 COLONIES/mL DIPHTHEROIDS(CORYNEBACTERIUM SPECIES) >=100,000 COLONIES/mL VIRIDANS STREPTOCOCCUS Standardized susceptibility testing for this organism is not available. Performed at [facility], [place] [address], [place], [place] [address]    Report Status PENDING  Incomplete      Radiology Studies: MR HEEL LEFT WO CONTRAST  Result Date: 06/16/2022 CLINICAL DATA:  Osteomyelitis suspected, foot, no prior imaging EXAM: MR OF THE LEFT HEEL WITHOUT CONTRAST TECHNIQUE: Multiplanar, multisequence MR imaging of the left heel was performed. No intravenous contrast was administered. COMPARISON:  Left foot radiograph 06/18/2021 FINDINGS: Extensive motion artifact degrades image quality. Bones/Joint/Cartilage There is subcortical marrow edema and mildly low T1 signal within the posterior calcaneus (sagittal stir image 16, axial T1 image 23). There is marrow edema within the lateral malleolus, with preserved T1 marrow signal. There is no tibiotalar or subtalar joint effusion. There is mild tibiotalar and posterior subtalar  osteoarthritis with mild periarticular subchondral marrow edema. There is periarticular marrow edema at the tarsometatarsal joints related to arthro arthritis. Ligaments Poorly evaluated due to motion artifact. Muscles and Tendons Poorly evaluated due to extensive motion artifact. No obvious tendon tear or significant tenosynovitis. Soft tissues There is a posterior heel soft tissue ulcer, extending to the surface of the posterior calcaneus. No evidence of soft tissue abscess. IMPRESSION: Extensive motion artifact degrades image quality. Posterior heel soft tissue ulcer with underlying osteomyelitis of the posterior calcaneus. No evidence of soft tissue abscess. Focal marrow edema within the lateral malleolus, with preserved T1 marrow signal. This is nonspecific and could reflect reactive marrow change or early osteomyelitis if there is adjacent lateral soft tissue ulcer, correlate with exam. This could also be posttraumatic. Mild tibiotalar and posterior subtalar osteoarthritis. No significant joint effusion. 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 Electronically Signed   By: Maurine Simmering M.D.   On: 06/16/2022 12:12      Scheduled Meds:  aspirin EC  81 mg Oral Daily   atenolol  25 mg Oral Daily   busPIRone  10 mg Oral TID   clopidogrel  75 mg Oral Daily   heparin  5,000 Units Subcutaneous Q8H   phenytoin  100 mg Oral Daily   phenytoin  200 mg Oral QHS   pravastatin  80 mg Oral QHS   Continuous Infusions:  sodium chloride 75 mL/hr at 06/18/22 0314   piperacillin-tazobactam (ZOSYN)  IV 3.375 g (06/18/22 0953)   vancomycin 750 mg (06/18/22 0955)     LOS: 4 days    Bonnielee Haff,  [facility] 06/18/2022, 9:55 AM

## 2022-06-19 ENCOUNTER — Encounter (HOSPITAL_COMMUNITY): Payer: Self-pay | Admitting: Internal Medicine

## 2022-06-19 ENCOUNTER — Inpatient Hospital Stay (HOSPITAL_COMMUNITY): Payer: Medicaid Other | Admitting: Anesthesiology

## 2022-06-19 ENCOUNTER — Other Ambulatory Visit: Payer: Self-pay

## 2022-06-19 ENCOUNTER — Encounter (HOSPITAL_COMMUNITY)
Admission: EM | Disposition: A | Discharge status: Skilled Nursing Facility | Payer: Self-pay | Source: Home / Self Care | Attending: Internal Medicine

## 2022-06-19 ENCOUNTER — Inpatient Hospital Stay (HOSPITAL_COMMUNITY): Payer: Medicaid Other

## 2022-06-19 DIAGNOSIS — F418 Other specified anxiety disorders: Secondary | ICD-10-CM | POA: Diagnosis not present

## 2022-06-19 DIAGNOSIS — T8754 Necrosis of amputation stump, left lower extremity: Secondary | ICD-10-CM

## 2022-06-19 DIAGNOSIS — M86172 Other acute osteomyelitis, left ankle and foot: Secondary | ICD-10-CM | POA: Diagnosis not present

## 2022-06-19 DIAGNOSIS — I10 Essential (primary) hypertension: Secondary | ICD-10-CM

## 2022-06-19 DIAGNOSIS — G9341 Metabolic encephalopathy: Secondary | ICD-10-CM | POA: Diagnosis not present

## 2022-06-19 DIAGNOSIS — G40909 Epilepsy, unspecified, not intractable, without status epilepticus: Secondary | ICD-10-CM | POA: Diagnosis not present

## 2022-06-19 DIAGNOSIS — T8781 Dehiscence of amputation stump: Secondary | ICD-10-CM | POA: Diagnosis not present

## 2022-06-19 DIAGNOSIS — I96 Gangrene, not elsewhere classified: Secondary | ICD-10-CM | POA: Diagnosis not present

## 2022-06-19 DIAGNOSIS — Z87891 Personal history of nicotine dependence: Secondary | ICD-10-CM

## 2022-06-19 HISTORY — PX: AMPUTATION: SHX166

## 2022-06-19 LAB — PREPARE RBC (CROSSMATCH)

## 2022-06-19 LAB — BASIC METABOLIC PANEL
Anion gap: 7 (ref 5–15)
BUN: 5 mg/dL — ABNORMAL LOW (ref 8–23)
CO2: 24 mmol/L (ref 22–32)
Calcium: 8.4 mg/dL — ABNORMAL LOW (ref 8.9–10.3)
Chloride: 106 mmol/L (ref 98–111)
Creatinine, Ser: 0.71 mg/dL (ref 0.61–1.24)
GFR, Estimated: >60 mL/min (ref >60)
Glucose, Bld: 107 mg/dL — ABNORMAL HIGH (ref 70–99)
Potassium: 3.8 mmol/L (ref 3.5–5.1)
Sodium: 137 mmol/L (ref 135–145)

## 2022-06-19 LAB — CBC
HCT: 29.4 % — ABNORMAL LOW (ref 39.0–52.0)
Hemoglobin: 9.4 g/dL — ABNORMAL LOW (ref 13.0–17.0)
MCH: 28.1 pg (ref 26.0–34.0)
MCHC: 32 g/dL (ref 30.0–36.0)
MCV: 87.8 fL (ref 80.0–100.0)
Platelets: 479 10*3/uL — ABNORMAL HIGH (ref 150–400)
RBC: 3.35 MIL/uL — ABNORMAL LOW (ref 4.22–5.81)
RDW: 13.2 % (ref 11.5–15.5)
WBC: 12.1 10*3/uL — ABNORMAL HIGH (ref 4.0–10.5)
nRBC: 0 % (ref 0.0–0.2)

## 2022-06-19 LAB — POCT I-STAT EG7
Acid-base deficit: 1 mmol/L (ref 0.0–2.0)
Bicarbonate: 23.3 mmol/L (ref 20.0–28.0)
Calcium, Ion: 1.15 mmol/L (ref 1.15–1.40)
HCT: 23 % — ABNORMAL LOW (ref 39.0–52.0)
Hemoglobin: 7.8 g/dL — ABNORMAL LOW (ref 13.0–17.0)
O2 Saturation: 87 %
Potassium: 3.5 mmol/L (ref 3.5–5.1)
Sodium: 135 mmol/L (ref 135–145)
TCO2: 24 mmol/L (ref 22–32)
pCO2, Ven: 38.2 mmHg — ABNORMAL LOW (ref 44–60)
pH, Ven: 7.393 (ref 7.25–7.43)
pO2, Ven: 53 mmHg — ABNORMAL HIGH (ref 32–45)

## 2022-06-19 LAB — GLUCOSE, CAPILLARY
Glucose-Capillary: 155 mg/dL — ABNORMAL HIGH (ref 70–99)
Glucose-Capillary: 120 mg/dL — ABNORMAL HIGH (ref 70–99)

## 2022-06-19 SURGERY — AMPUTATION, ABOVE KNEE
Anesthesia: General | Site: Knee | Laterality: Bilateral

## 2022-06-19 MED ORDER — MIDAZOLAM HCL 5 MG/5ML IJ SOLN
INTRAMUSCULAR | Status: DC | PRN
  Administered 2022-06-19: 1 mg via INTRAVENOUS

## 2022-06-19 MED ORDER — CLOPIDOGREL BISULFATE 75 MG PO TABS
75.0000 mg | ORAL_TABLET | Freq: Every day | ORAL | Status: DC
  Administered 2022-06-20 – 2022-06-22 (×3): 75 mg via ORAL
  Filled 2022-06-19 (×4): qty 1

## 2022-06-19 MED ORDER — LACTATED RINGERS IV SOLN: INTRAVENOUS | Status: DC | PRN

## 2022-06-19 MED ORDER — MORPHINE SULFATE (PF) 2 MG/ML IV SOLN
2.0000 mg | INTRAVENOUS | Status: DC | PRN
  Administered 2022-06-19 – 2022-06-22 (×5): 2 mg via INTRAVENOUS
  Filled 2022-06-19 (×5): qty 1

## 2022-06-19 MED ORDER — BACITRACIN ZINC 500 UNIT/GM EX OINT
TOPICAL_OINTMENT | CUTANEOUS | Status: AC
  Filled 2022-06-19: qty 28.35

## 2022-06-19 MED ORDER — 0.9 % SODIUM CHLORIDE (POUR BTL) OPTIME
TOPICAL | Status: DC | PRN
  Administered 2022-06-19: 1000 mL

## 2022-06-19 MED ORDER — BACITRACIN ZINC 500 UNIT/GM EX OINT
TOPICAL_OINTMENT | CUTANEOUS | Status: DC | PRN
  Administered 2022-06-19 (×2): 1 via TOPICAL

## 2022-06-19 MED ORDER — FENTANYL CITRATE (PF) 250 MCG/5ML IJ SOLN
INTRAMUSCULAR | Status: AC
  Filled 2022-06-19: qty 5

## 2022-06-19 MED ORDER — OXYCODONE-ACETAMINOPHEN 5-325 MG PO TABS
1.0000 | ORAL_TABLET | ORAL | Status: DC | PRN
  Administered 2022-06-19 – 2022-06-22 (×8): 2 via ORAL
  Filled 2022-06-19 (×4): qty 2
  Filled 2022-06-19: qty 1
  Filled 2022-06-19 (×4): qty 2

## 2022-06-19 MED ORDER — PHENYLEPHRINE HCL-NACL 20-0.9 MG/250ML-% IV SOLN
INTRAVENOUS | Status: DC | PRN
  Administered 2022-06-19: 25 ug/min via INTRAVENOUS

## 2022-06-19 MED ORDER — OXYCODONE HCL 5 MG/5ML PO SOLN: 5.0000 mg | Freq: Once | ORAL | Status: DC | PRN

## 2022-06-19 MED ORDER — ONDANSETRON HCL 4 MG/2ML IJ SOLN
INTRAMUSCULAR | Status: DC | PRN
  Administered 2022-06-19: 4 mg via INTRAVENOUS

## 2022-06-19 MED ORDER — ACETAMINOPHEN 500 MG PO TABS
1000.0000 mg | ORAL_TABLET | Freq: Once | ORAL | Status: AC
  Administered 2022-06-19: 1000 mg via ORAL
  Filled 2022-06-19: qty 2

## 2022-06-19 MED ORDER — FENTANYL CITRATE (PF) 100 MCG/2ML IJ SOLN
INTRAMUSCULAR | Status: DC | PRN
  Administered 2022-06-19 (×3): 25 ug via INTRAVENOUS
  Administered 2022-06-19: 50 ug via INTRAVENOUS
  Administered 2022-06-19: 25 ug via INTRAVENOUS

## 2022-06-19 MED ORDER — ALBUMIN HUMAN 5 % IV SOLN: INTRAVENOUS | Status: DC | PRN

## 2022-06-19 MED ORDER — LIDOCAINE 2% (20 MG/ML) 5 ML SYRINGE
INTRAMUSCULAR | Status: DC | PRN
  Administered 2022-06-19: 40 mg via INTRAVENOUS

## 2022-06-19 MED ORDER — PROPOFOL 10 MG/ML IV BOLUS
INTRAVENOUS | Status: AC
  Filled 2022-06-19: qty 20

## 2022-06-19 MED ORDER — OXYCODONE HCL 5 MG PO TABS: 5.0000 mg | ORAL_TABLET | Freq: Once | ORAL | Status: DC | PRN

## 2022-06-19 MED ORDER — ONDANSETRON HCL 4 MG/2ML IJ SOLN
INTRAMUSCULAR | Status: AC
  Filled 2022-06-19: qty 2

## 2022-06-19 MED ORDER — DEXAMETHASONE SODIUM PHOSPHATE 10 MG/ML IJ SOLN
INTRAMUSCULAR | Status: DC | PRN
  Administered 2022-06-19: 5 mg via INTRAVENOUS

## 2022-06-19 MED ORDER — MIDAZOLAM HCL 2 MG/2ML IJ SOLN
INTRAMUSCULAR | Status: AC
  Filled 2022-06-19: qty 2

## 2022-06-19 MED ORDER — PHENYLEPHRINE 80 MCG/ML (10ML) SYRINGE FOR IV PUSH (FOR BLOOD PRESSURE SUPPORT)
PREFILLED_SYRINGE | INTRAVENOUS | Status: DC | PRN
  Administered 2022-06-19 (×3): 80 ug via INTRAVENOUS
  Administered 2022-06-19 (×3): 40 ug via INTRAVENOUS

## 2022-06-19 MED ORDER — ONDANSETRON HCL 4 MG/2ML IJ SOLN: 4.0000 mg | Freq: Once | INTRAMUSCULAR | Status: DC | PRN

## 2022-06-19 MED ORDER — ASPIRIN 81 MG PO TBEC
81.0000 mg | DELAYED_RELEASE_TABLET | Freq: Every day | ORAL | Status: DC
  Administered 2022-06-20 – 2022-06-22 (×3): 81 mg via ORAL
  Filled 2022-06-19 (×3): qty 1

## 2022-06-19 MED ORDER — PROPOFOL 10 MG/ML IV BOLUS
INTRAVENOUS | Status: DC | PRN
  Administered 2022-06-19: 40 mg via INTRAVENOUS
  Administered 2022-06-19: 60 mg via INTRAVENOUS

## 2022-06-19 MED ORDER — DEXAMETHASONE SODIUM PHOSPHATE 10 MG/ML IJ SOLN
INTRAMUSCULAR | Status: AC
  Filled 2022-06-19: qty 1

## 2022-06-19 MED ORDER — PHENYLEPHRINE 80 MCG/ML (10ML) SYRINGE FOR IV PUSH (FOR BLOOD PRESSURE SUPPORT)
PREFILLED_SYRINGE | INTRAVENOUS | Status: AC
  Filled 2022-06-19: qty 10

## 2022-06-19 MED ORDER — FENTANYL CITRATE (PF) 100 MCG/2ML IJ SOLN: 25.0000 ug | INTRAMUSCULAR | Status: DC | PRN

## 2022-06-19 SURGICAL SUPPLY — 62 items
BAG COUNTER SPONGE SURGICOUNT (BAG) ×1 IMPLANT
BANDAGE ESMARK 6X9 LF (GAUZE/BANDAGES/DRESSINGS) ×1 IMPLANT
BLADE SAW RECIP 87.9 MT (BLADE) ×1 IMPLANT
BNDG COHESIVE 6X5 TAN STRL LF (GAUZE/BANDAGES/DRESSINGS) ×1 IMPLANT
BNDG ELASTIC 4X5.8 VLCR STR LF (GAUZE/BANDAGES/DRESSINGS) ×2 IMPLANT
BNDG ESMARK 6X9 LF (GAUZE/BANDAGES/DRESSINGS)
BNDG GAUZE DERMACEA FLUFF 4 (GAUZE/BANDAGES/DRESSINGS) ×1 IMPLANT
BUR DISC 0.8X25 (BURR) IMPLANT
BUR STRYKR EGG 5.0 (BURR) IMPLANT
CANISTER SUCT 3000ML PPV (MISCELLANEOUS) ×1 IMPLANT
COVER SURGICAL LIGHT HANDLE (MISCELLANEOUS) ×1 IMPLANT
CUFF TOURN SGL QUICK 18X4 (TOURNIQUET CUFF) IMPLANT
CUFF TOURN SGL QUICK 24 (TOURNIQUET CUFF)
CUFF TOURN SGL QUICK 34 (TOURNIQUET CUFF)
CUFF TOURN SGL QUICK 42 (TOURNIQUET CUFF) IMPLANT
CUFF TRNQT CYL 24X4X16.5-23 (TOURNIQUET CUFF) IMPLANT
CUFF TRNQT CYL 34X4.125X (TOURNIQUET CUFF) IMPLANT
DRAIN CHANNEL 19F RND (DRAIN) IMPLANT
DRAPE HALF SHEET 40X57 (DRAPES) ×1 IMPLANT
DRAPE INCISE 23X17 IOBAN STRL (DRAPES) ×2
DRAPE INCISE 23X17 STRL (DRAPES) IMPLANT
DRAPE INCISE IOBAN 23X17 STRL (DRAPES) ×2 IMPLANT
DRAPE INCISE IOBAN 66X45 STRL (DRAPES) ×1 IMPLANT
DRAPE ORTHO SPLIT 77X108 STRL (DRAPES) ×2
DRAPE SURG ORHT 6 SPLT 77X108 (DRAPES) ×2 IMPLANT
DRAPE U-SHAPE 47X51 STRL (DRAPES) ×1 IMPLANT
DRSG ADAPTIC 3X8 NADH LF (GAUZE/BANDAGES/DRESSINGS) ×1 IMPLANT
ELECT CAUTERY BLADE 6.4 (BLADE) ×1 IMPLANT
ELECT REM PT RETURN 9FT ADLT (ELECTROSURGICAL) ×1
ELECTRODE REM PT RTRN 9FT ADLT (ELECTROSURGICAL) ×1 IMPLANT
EVACUATOR SILICONE 100CC (DRAIN) IMPLANT
GAUZE SPONGE 4X4 12PLY STRL (GAUZE/BANDAGES/DRESSINGS) ×1 IMPLANT
GLOVE BIO SURGEON STRL SZ7 (GLOVE) IMPLANT
GLOVE BIO SURGEON STRL SZ7.5 (GLOVE) ×1 IMPLANT
GLOVE BIOGEL PI IND STRL 8 (GLOVE) ×1 IMPLANT
GLOVE SURG POLY ORTHO LF SZ7.5 (GLOVE) IMPLANT
GLOVE SURG UNDER LTX SZ8 (GLOVE) ×1 IMPLANT
GOWN STRL REUS W/ TWL LRG LVL3 (GOWN DISPOSABLE) ×3 IMPLANT
GOWN STRL REUS W/TWL LRG LVL3 (GOWN DISPOSABLE) ×3
KIT BASIN OR (CUSTOM PROCEDURE TRAY) ×1 IMPLANT
KIT TURNOVER KIT B (KITS) ×1 IMPLANT
NS IRRIG 1000ML POUR BTL (IV SOLUTION) ×1 IMPLANT
PACK GENERAL/GYN (CUSTOM PROCEDURE TRAY) ×1 IMPLANT
PAD ARMBOARD 7.5X6 YLW CONV (MISCELLANEOUS) ×2 IMPLANT
RASP HELIOCORDIAL MED (MISCELLANEOUS) IMPLANT
SPONGE T-LAP 18X18 ~~LOC~~+RFID (SPONGE) IMPLANT
STAPLER VISISTAT 35W (STAPLE) ×1 IMPLANT
STOCKINETTE IMPERVIOUS LG (DRAPES) ×1 IMPLANT
SUT ETHILON 3 0 PS 1 (SUTURE) IMPLANT
SUT SILK 0 TIES 10X30 (SUTURE) ×1 IMPLANT
SUT SILK 2 0 (SUTURE) ×1
SUT SILK 2 0 SH CR/8 (SUTURE) ×1 IMPLANT
SUT SILK 2-0 18XBRD TIE 12 (SUTURE) ×1 IMPLANT
SUT SILK 3 0 (SUTURE)
SUT SILK 3-0 18XBRD TIE 12 (SUTURE) ×1 IMPLANT
SUT VIC AB 2-0 CT1 18 (SUTURE) ×1 IMPLANT
SUT VIC AB 3-0 SH 27 (SUTURE) ×2
SUT VIC AB 3-0 SH 27X BRD (SUTURE) IMPLANT
TOWEL GREEN STERILE (TOWEL DISPOSABLE) ×2 IMPLANT
TOWEL GREEN STERILE FF (TOWEL DISPOSABLE) ×1 IMPLANT
UNDERPAD 30X36 HEAVY ABSORB (UNDERPADS AND DIAPERS) ×1 IMPLANT
WATER STERILE IRR 1000ML POUR (IV SOLUTION) ×1 IMPLANT

## 2022-06-19 NOTE — Anesthesia Postprocedure Evaluation (Signed)
Anesthesia Post Note  Patient: [name]  Procedure(s) Performed: LEFT AMPUTATION ABOVE KNEE WITH RIGHT ABOVE KNEE REVISION AMPUTATION (Bilateral: Knee)     Patient location during evaluation: PACU Anesthesia Type: General Level of consciousness: awake Pain management: pain level controlled Vital Signs Assessment: post-procedure vital signs reviewed and stable Respiratory status: spontaneous breathing, nonlabored ventilation, respiratory function stable and patient connected to nasal cannula oxygen Cardiovascular status: blood pressure returned to baseline and stable Postop Assessment: no apparent nausea or vomiting Anesthetic complications: no   No notable events documented.  Last Vitals:  Vitals:   06/19/22 1050 06/19/22 1100  BP: 125/60 131/69  Pulse: 94 93  Resp: 14 20  Temp: 36.7 C 36.7 C  SpO2: 97% 98%    Last Pain:  Vitals:   06/19/22 1050  TempSrc: Oral  PainSc:                  Audry Pili

## 2022-06-19 NOTE — Progress Notes (Signed)
PROGRESS NOTE    [name]  ONG:[id number] DOB: October 01, 1955 DOA: 06/14/2022 PCP: Jeanette Caprice, PA-C     Brief Narrative:  [name] is a 66 y.o. 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 Patient presents to ED from NH after family visited patient and noted that he was not his usual self. In addition there was question of a possible ground level fall, of note no evidence of fall or visible injury was noted per NH. Due to these issues, at family request patient was transferred to ED by EMS. Initially thought his AMS was due to UTI. He was started on rocephin. However, due to patient's chronic left heel wound, osteomyelitis was also considered. He underwent MRI 12/23 which revealed osteomyelitis.   Subjective: Patient seen after he returned from the OR.  Still under the effects of anesthesia.  His sister was at the bedside.    Assessment & Plan:   Acute metabolic encephalopathy, in setting of underlying TBI, cognitive memory loss Could be secondary to infection, supratherapeutic phenytoin level. Neurontin and Remeron were placed on hold. Mentation appears to have improved.  Phenytoin dose was decreased.  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 Trula Slade. No re-vascularization options noted from outpatient note -Aspirin, plavix  -CRP 17.4, sed rate 100 -Vancomycin, Zosyn -MRI positive for posterior heel ulcer with underlying osteomyelitis.  Patient was seen by vascular surgery.  He will need above-knee amputation.  Patient underwent surgery this morning. Continue with broad-spectrum antibiotics for now.  No  cultures were sent. Should be able to discontinue antibiotics in 24 to 48 hours since we have source control.  UTI ruled out  -Urine culture only showing 1000 colonies corynebacterium species   Seizure disorder/Supratherapeutic phenytoin level -Phenytoin level normalized.  Decreased dose to resume at '100mg'$  in AM and '200mg'$  qhs  Hyponatremia Improved.  Noted to be on normal saline infusion.  Cut back on the rate.  Normocytic anemia Hemoglobin stable.  No evidence for overt bleeding.  Likely anemia of chronic disease.  Labs tomorrow.  Elevated D Dimer -No clinical sign of PE, remains stable on room air   Essential hypertension High readings noted.  Noted to be on atenolol which is being continued.  Hyperlipidemia  -Pravachol     DVT prophylaxis: Subcutaneous heparin Code Status: Full Family Communication: Sister updated Disposition Plan: Here from skilled nursing facility.  Anticipate discharge back to SNF when medically improved.  Status is: Inpatient Remains inpatient appropriate because: Osteomyelitis of the left heel requiring surgery   Antimicrobials:  Anti-infectives (From admission, onward)    Start     Dose/Rate Route Frequency Ordered Stop   06/17/22 1000  vancomycin (VANCOREADY) IVPB 750 mg/150 mL        750 mg 150 mL/hr over 60 Minutes Intravenous Every 24 hours 06/16/22 0752     06/16/22 0900  vancomycin (VANCOCIN) IVPB 1000 mg/200 mL premix        1,000 mg 200 mL/hr over 60 Minutes Intravenous  Once 06/16/22 0752 06/16/22 0959   06/16/22 0800  piperacillin-tazobactam (ZOSYN) IVPB 3.375 g        3.375 g 12.5 mL/hr over 240 Minutes Intravenous  Every 8 hours 06/16/22 0728     06/15/22 2100  cefTRIAXone (ROCEPHIN) 1 g in sodium chloride 0.9 % 100 mL IVPB  Status:  Discontinued        1 g 200 mL/hr over 30 Minutes Intravenous Every 24 hours 06/15/22 0335 06/16/22 0728   06/14/22 1845  cefTRIAXone (ROCEPHIN) 1 g in sodium chloride 0.9 % 100 mL IVPB        1  g 200 mL/hr over 30 Minutes Intravenous  Once 06/14/22 1832 06/14/22 2116        Objective: Vitals:   06/19/22 1035 06/19/22 1045 06/19/22 1050 06/19/22 1100  BP:  110/61 125/60 131/69  Pulse: 92 93 94 93  Resp: '17 17 14 20  '$ Temp: 98.8 F (37.1 C)  98 F (36.7 C) 98 F (36.7 C)  TempSrc: Oral  Oral   SpO2: 95% 97% 97% 98%  Weight:        Intake/Output Summary (Last 24 hours) at 06/19/2022 1211 Last data filed at 06/19/2022 1013 Gross per 24 hour  Intake 1100 ml  Output 1450 ml  Net -350 ml   Filed Weights   06/17/22 2055 06/18/22 0532 06/19/22 0459  Weight: 43 kg 43 kg 41.5 kg    Examination:  General appearance: Awake alert.  In no distress.  Distracted Resp: Clear to auscultation bilaterally.  Normal effort Cardio: S1-S2 is normal regular.  No S3-S4.  No rubs murmurs or bruit GI: Abdomen is soft.  Nontender nondistended.  Bowel sounds are present normal.  No masses organomegaly Extremities: Bilateral AKA stumps noted.    Data Reviewed: I have personally reviewed following labs and imaging studies  CBC: Recent Labs  Lab 06/14/22 1548 06/14/22 2301 06/15/22 0121 06/16/22 0701 06/17/22 0545 06/18/22 0304 06/19/22 0428 06/19/22 0944  WBC 10.5   < > 14.2* 10.9* 10.9* 11.8* 12.1*  --   NEUTROABS 6.8  --   --   --   --   --   --   --   HGB 10.9*   < > 10.5* 9.7* 9.3* 10.3* 9.4* 7.8*  HCT 34.4*   < > 33.6* 30.3* 28.7* 31.9* 29.4* 23.0*  MCV 88.9   < > 89.4 86.3 86.7 87.2 87.8  --   PLT 436*   < > 426* 412* 414* 465* 479*  --    < > = values in this interval not displayed.   Basic Metabolic Panel: Recent Labs  Lab 06/15/22 0121 06/16/22 0701 06/17/22 0545 06/18/22 0304 06/19/22 0428 06/19/22 0944  NA 129* 132* 133* 134* 137 135  K 3.8 3.5 3.5 3.7 3.8 3.5  CL 94* 100 101 100 106  --   CO2 '24 24 23 '$ 20* 24  --   GLUCOSE 137* 105* 109* 114* 107*  --   BUN '17 10 13 8 '$ 5*  --   CREATININE 0.73 0.61 0.77 0.77 0.71  --   CALCIUM 8.6* 8.5* 8.6* 8.5*  8.4*  --    GFR: Estimated Creatinine Clearance: 54.8 mL/min (by C-G formula based on SCr of 0.71 mg/dL).  Liver Function Tests: Recent Labs  Lab 06/14/22 1548 06/15/22 0121  AST 32 37  ALT 23 25  ALKPHOS 91 91  BILITOT 0.3 0.5  PROT 7.8 7.7  ALBUMIN 2.8* 2.9*    Recent Labs  Lab 06/14/22 1548 06/14/22 2301  AMMONIA 18 27   CBG: Recent Labs  Lab 06/16/22 0724 06/18/22 0812  GLUCAP 99 102*    Sepsis Labs: Recent  Labs  Lab 06/14/22 1548 06/14/22 1840 06/14/22 2056  LATICACIDVEN 2.4* 2.0* 1.0    Recent Results (from the past 240 hour(s))  Urine Culture     Status: Abnormal (Preliminary result)   Collection Time: 06/14/22  3:37 PM   Specimen: Urine, Clean Catch  Result Value Ref Range Status   Specimen Description   Final    URINE, CLEAN CATCH Performed at [facility], [place] [address], [place], [place] [address]    Special Requests   Final    NONE Performed at [facility], [place] [address] [place], [place] [address]    Culture (A)  Final    1,000 COLONIES/mL DIPHTHEROIDS(CORYNEBACTERIUM SPECIES) >=100,000 COLONIES/mL VIRIDANS STREPTOCOCCUS Standardized susceptibility testing for this organism is not available. Performed at [facility], [place] [address], [place], [place] [address]    Report Status PENDING  Incomplete      Radiology Studies: DG FEMUR 1V LEFT  Result Date: 06/19/2022 CLINICAL DATA:  Follow-up left above the knee amputation EXAM: LEFT FEMUR 1 VIEW COMPARISON:  None FINDINGS: Above the knee amputation of the left leg about 10 cm below the trochanteric region. Residual femoral hardware. No unexpected radiographic finding. IMPRESSION: Above the knee amputation of the left leg about 10 cm below the trochanteric region. Electronically Signed   By: Nelson Chimes M.D.   On: 06/19/2022 10:28      Scheduled Meds:  acetaminophen  1,000 mg Oral Once   [START ON 06/20/2022] aspirin EC  81 mg Oral Daily    atenolol  25 mg Oral Daily   busPIRone  10 mg Oral TID   [START ON 06/20/2022] clopidogrel  75 mg Oral Daily   heparin  5,000 Units Subcutaneous Q8H   phenytoin  100 mg Oral Daily   phenytoin  200 mg Oral QHS   pravastatin  80 mg Oral QHS   Continuous Infusions:  piperacillin-tazobactam (ZOSYN)  IV 3.375 g (06/19/22 0100)   vancomycin 750 mg (06/18/22 0955)     LOS: 5 days    Bonnielee Haff,  [facility] 06/19/2022, 12:11 PM

## 2022-06-19 NOTE — Progress Notes (Signed)
Pharmacy Antibiotic Note- follow-up  [name] is a 66 y.o. 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 Admitted with acute metabolic encephalopathy.  Initially on Rocephin.  Today, MR left heel ordered.  Zosyn initiated by MD and Pharmacy has been consulted for vancomycin dosing for osteomyelitis.  Plan: Vancomycin 750 mg IV every 24 hours. eAUC 438.7 mcg*hr/mL. (Scr used: rounded up to 0.8) Zosyn 3.375 grams iv q8h (ei)  Monitor clinical progress, renal function, vancomycin levels as indicated F/U C&S, abx deescalation / LOT   Weight: 41.5 kg (91 lb 7.9 oz)  Temp (24hrs), Avg:98.7 F (37.1 C), Min:98 F (36.7 C), Max:99.9 F (37.7 C)  Recent Labs  Lab 06/14/22 1548 06/14/22 1840 06/14/22 2056 06/14/22 2301 06/15/22 0121 06/16/22 0701 06/17/22 0545 06/18/22 0304 06/19/22 0428  WBC 10.5  --   --    < > 14.2* 10.9* 10.9* 11.8* 12.1*  CREATININE 0.90  --   --    < > 0.73 0.61 0.77 0.77 0.71  LATICACIDVEN 2.4* 2.0* 1.0  --   --   --   --   --   --    < > = values in this interval not displayed.     Estimated Creatinine Clearance: 54.8 mL/min (by C-G formula based on SCr of 0.71 mg/dL).    Allergies  Allergen Reactions   Isovue [Iopamidol] Hives    Pt broke out in one hive on his chest after contrast on 01/03/15.  Pt will need full premeds in the future per Dr [place].      Antimicrobials this admission: 12/21 Rocephin >> 12/23 12/23 Zosyn >>  12/23 vancomycin >>  Dose adjustments this admission:   Microbiology results: 12/21 UCx: >100K strep viridans / 1K corynebacterium sp. Susceptibilities to follow   Thank you for allowing pharmacy to be a part of  this patient's care.  Vaughan Basta BS, PharmD, BCPS Clinical Pharmacist 06/19/2022 11:25 AM  Contact: [phone] after 3 PM  "Be curious, not judgmental..." -Jamal Maes

## 2022-06-19 NOTE — Interval H&P Note (Signed)
History and Physical Interval Note:  06/19/2022 7:15 AM  [name]  has presented today for surgery, with the diagnosis of Gangren Left Foot.  The various methods of treatment have been discussed with the patient and family. After consideration of risks, benefits and other options for treatment, the patient has consented to  Procedure(s): LEFT AMPUTATION ABOVE KNEE WITH RIGHT ABOVE KNEE REVISION AMPUTATION (Bilateral) as a surgical intervention.  The patient's history has been reviewed, patient examined, no change in status, stable for surgery.  I have reviewed the patient's chart and labs.  Questions were answered to the patient's satisfaction.     Deitra Mayo

## 2022-06-19 NOTE — Op Note (Signed)
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 Scot Dock, MD  ASSIST: Luisa Dago, PA  ANESTHESIA: General  EBL: 200 cc  INDICATIONS:    TIMARION AGCAOILI is a 66 y.o. male who presented with a gangrenous wound on his left heel.  He underwent an arteriogram and was not a candidate for revascularization.  He has a contracture of the left knee.  He was also noted to have exposed bone on his right above-the-knee amputation.  I felt that this needed to be shortened.  FINDINGS:   He had a complex fracture in the past and had a femur rod which I had to cut through with a bur.  The muscle was well-perfused at this level.  TECHNIQUE:   The patient was taken to the operating room and received a general anesthetic.  Both legs were prepped and draped in usual sterile fashion.  On the right side I marked a fishmouth incision further proximal on the thigh.  A tourniquet was placed on the upper thigh.  The tourniquet was inflated to 300 mmHg.  Under tourniquet control the incision was carried down to the skin, subcutaneous tissue, fascia and muscle to the femur which was dissected free circumferentially.  The periosteum was elevated and the bone divided proximal to the level of skin division.  The superficial femoral artery and vein were individually suture-ligated.  There was a stent in the superficial femoral artery which was removed.  The wound was irrigated with copious amounts of saline.  The edges of the bone were rasped.  I closed a layer of 3-0 Vicryl over the bone.  The fascia was then closed with interrupted 2-0 Vicryl's.  The skin was closed with staples.  Attention was turned to the left leg.  A fishmouth  incision was marked about the same level on the left is on the right.  Tourniquet was placed on the upper thigh.  The leg was exsanguinated with an Esmarch bandage and the tourniquet inflated to 300 mmHg.  Under tourniquet control the incision was carried down to the skin and subcutaneous tissue and muscle to the femur which was dissected free circumferentially.  I began to divide the bone but it was clear there was a rod present.  I therefore had to wedge out some of the bone with the saw to allow exposure of the rod and was able to use a bur to cut through the rod.  We have tried to protect the tissue with moist laps.  Of note this patient had an old fracture with a very irregular fracture line which was all dissected free so that we could divide more proximally.  Hemostasis was obtained in the wound.  Again a layer of 3-0 Vicryl was closed over the bone.  The fascia was then closed with interrupted 2-0 Vicryl's.  The skin was closed with staples.  Sterile dressings were applied on both sides.  The patient tolerated the procedure well was transferred to the recovery room in stable condition.  All needle and sponge counts were correct.  Given the complexity of the case a first assistant was necessary in order to expedient the procedure and safely perform the technical aspects of the operation.  Deitra Mayo, MD, FACS [facility]  DATE OF DICTATION:   06/19/2022

## 2022-06-19 NOTE — Transfer of Care (Signed)
Immediate Anesthesia Transfer of Care Note  Patient: [name]  Procedure(s) Performed: LEFT AMPUTATION ABOVE KNEE WITH RIGHT ABOVE KNEE REVISION AMPUTATION (Bilateral: Knee)  Patient Location: PACU  Anesthesia Type:General  Level of Consciousness: awake and patient cooperative  Airway & Oxygen Therapy: Patient Spontanous Breathing  Post-op Assessment: Report given to RN and Post -op Vital signs reviewed and stable  Post vital signs: Reviewed and stable  Last Vitals:  Vitals Value Taken Time  BP    Temp    Pulse    Resp    SpO2      Last Pain:  Vitals:   06/19/22 0428  TempSrc: Oral  PainSc:          Complications: No notable events documented.

## 2022-06-19 NOTE — Anesthesia Procedure Notes (Signed)
Procedure Name: LMA Insertion Date/Time: 06/19/2022 7:40 AM  Performed by: Gwyndolyn Saxon, CRNAPre-anesthesia Checklist: Patient identified, Emergency Drugs available, Suction available and Patient being monitored Patient Re-evaluated:Patient Re-evaluated prior to induction Oxygen Delivery Method: Circle system utilized Preoxygenation: Pre-oxygenation with 100% oxygen Induction Type: IV induction Ventilation: Mask ventilation without difficulty LMA: LMA inserted LMA Size: 4.0 Number of attempts: 1 Placement Confirmation: positive ETCO2 and breath sounds checked- equal and bilateral Tube secured with: Tape Dental Injury: Teeth and Oropharynx as per pre-operative assessment

## 2022-06-19 NOTE — Progress Notes (Signed)
PT Cancellation Note  Patient Details Name: ETTORE TREBILCOCK MRN: [id number] DOB: 1956/02/21   Cancelled Treatment:    Reason Eval/Treat Not Completed: Patient at procedure or test/unavailable  Patient currently in the OR. Will proceed with evaluation when medically appropriate.    Shelly  Office [phone]  Rexanne Mano 06/19/2022, 7:57 AM

## 2022-06-20 ENCOUNTER — Encounter (HOSPITAL_COMMUNITY): Payer: Self-pay | Admitting: Vascular Surgery

## 2022-06-20 DIAGNOSIS — G9341 Metabolic encephalopathy: Secondary | ICD-10-CM | POA: Diagnosis not present

## 2022-06-20 LAB — CBC
HCT: 21.8 % — ABNORMAL LOW (ref 39.0–52.0)
Hemoglobin: 7.4 g/dL — ABNORMAL LOW (ref 13.0–17.0)
MCH: 28.6 pg (ref 26.0–34.0)
MCHC: 33.9 g/dL (ref 30.0–36.0)
MCV: 84.2 fL (ref 80.0–100.0)
Platelets: 349 10*3/uL (ref 150–400)
RBC: 2.59 MIL/uL — ABNORMAL LOW (ref 4.22–5.81)
RDW: 14.2 % (ref 11.5–15.5)
WBC: 13.2 10*3/uL — ABNORMAL HIGH (ref 4.0–10.5)
nRBC: 0 % (ref 0.0–0.2)

## 2022-06-20 LAB — BASIC METABOLIC PANEL
Anion gap: 8 (ref 5–15)
BUN: 8 mg/dL (ref 8–23)
CO2: 21 mmol/L — ABNORMAL LOW (ref 22–32)
Calcium: 7.8 mg/dL — ABNORMAL LOW (ref 8.9–10.3)
Chloride: 103 mmol/L (ref 98–111)
Creatinine, Ser: 0.73 mg/dL (ref 0.61–1.24)
GFR, Estimated: >60 mL/min (ref >60)
Glucose, Bld: 120 mg/dL — ABNORMAL HIGH (ref 70–99)
Potassium: 3.9 mmol/L (ref 3.5–5.1)
Sodium: 132 mmol/L — ABNORMAL LOW (ref 135–145)

## 2022-06-20 LAB — URINE CULTURE: Culture: 1000 — AB

## 2022-06-20 LAB — GLUCOSE, CAPILLARY
Glucose-Capillary: 131 mg/dL — ABNORMAL HIGH (ref 70–99)
Glucose-Capillary: 133 mg/dL — ABNORMAL HIGH (ref 70–99)

## 2022-06-20 LAB — VANCOMYCIN, PEAK: Vancomycin Pk: 12 ug/mL — ABNORMAL LOW (ref 30–40)

## 2022-06-20 MED ORDER — JUVEN PO PACK
1.0000 | PACK | Freq: Two times a day (BID) | ORAL | Status: DC
  Administered 2022-06-20 – 2022-06-22 (×4): 1 via ORAL
  Filled 2022-06-20 (×4): qty 1

## 2022-06-20 MED ORDER — ADULT MULTIVITAMIN W/MINERALS CH
1.0000 | ORAL_TABLET | Freq: Every day | ORAL | Status: DC
  Administered 2022-06-20 – 2022-06-22 (×3): 1 via ORAL
  Filled 2022-06-20 (×3): qty 1

## 2022-06-20 MED ORDER — BOOST PLUS PO LIQD
237.0000 mL | Freq: Three times a day (TID) | ORAL | Status: DC
  Administered 2022-06-20 – 2022-06-22 (×7): 237 mL via ORAL
  Filled 2022-06-20 (×9): qty 237

## 2022-06-20 NOTE — Evaluation (Signed)
Physical Therapy Evaluation and Discharge Patient Details Name: [name] MRN: [id number] DOB: 1956-04-24 Today's Date: 06/20/2022  History of Present Illness  [name] is a 66 yr old male admitted to the hospital with altered mental status. He was found to have acute metabolic encephalopathy and osteomyelitis of left heel. Underwent R AKA revision and L AKA on 12/26   PMH: TBI, seizure disorder, HTN, HLD, AAA, PAD, R AKA  Clinical Impression   Patient evaluated by Physical Therapy with no further acute PT needs identified. Patient awake and alert, however confused (likely near his baseline). Patient unable to provide history. Called [facility] (per chart where pt resides) and unable to get a staff member familiar with him to come to the phone to discuss his prior functional status. Based on prior left heel ulcer with left knee contracture, do not feel pt was able to assist with OOB mobility. He is likely at his baseline and can return to long-term carel.  PT is signing off. Thank you for this referral.        Recommendations for follow up therapy are one component of a multi-disciplinary discharge planning process, led by the attending physician.  Recommendations may be updated based on patient status, additional functional criteria and insurance authorization.  Follow Up Recommendations Long-term institutional care without follow-up therapy Can patient physically be transported by private vehicle: No    Assistance Recommended at Discharge Frequent or constant Supervision/Assistance  Patient can return home with the following   (will need lift for OOB)    Equipment Recommendations None recommended by PT  Recommendations for Other Services       Functional Status Assessment Patient has not had a recent decline in their functional status     Precautions / Restrictions Restrictions Weight Bearing Restrictions:  (bil AKA) Other Position/Activity Restrictions: R AKA       Mobility  Bed Mobility               General bed mobility comments: The patient required total assist for repositioning at bed level. He did not initiate bed mobility to prompt or even assist once PT initiated    Transfers                        Ambulation/Gait                  Stairs            Wheelchair Mobility    Modified Rankin (Stroke Patients Only)       Balance                                             Pertinent Vitals/Pain Pain Assessment Pain Assessment: Faces Faces Pain Scale: Hurts whole lot Pain Location: bil AKA with attempted ROM Pain Descriptors / Indicators: Grimacing, Moaning Pain Intervention(s): Limited activity within patient's tolerance, Monitored during session    Home Living Family/patient expects to be discharged to:: Other (Comment)                   Additional Comments: [facility] per chart    Prior Function Prior Level of Function : Patient poor historian/Family not available             Mobility Comments: Attempted to call [facility] with no clinical staff available to provide  prior functional status information; based on left heel wound and knee contracture on the left on admission, does not appear pt was very mobile       Hand Dominance        Extremity/Trunk Assessment   Upper Extremity Assessment Upper Extremity Assessment: Defer to OT evaluation    Lower Extremity Assessment Lower Extremity Assessment: RLE deficits/detail;LLE deficits/detail RLE Deficits / Details: in supine holding RLE in ~90 degrees of hip flesion and would not relax at hip to lower his leg LLE Deficits / Details: in supine holding RLE in ~90 degrees of hip flesion and would not relax at hip to lower his leg    Cervical / Trunk Assessment Cervical / Trunk Assessment: Normal  Communication   Communication:  (soft spoken)  Cognition Arousal/Alertness: Awake/alert Behavior During  Therapy: Flat affect Overall Cognitive Status: History of cognitive impairments - at baseline (Patient has a history of TBI, per medical chart.)                                 General Comments: able to state his name only; could not provide any history; not folloowing commands        General Comments      Exercises     Assessment/Plan    PT Assessment Patient does not need any further PT services  PT Problem List         PT Treatment Interventions      PT Goals (Current goals can be found in the Care Plan section)  Acute Rehab PT Goals Patient Stated Goal: unable PT Goal Formulation: Patient unable to participate in goal setting    Frequency       Co-evaluation               AM-PAC PT "6 Clicks" Mobility  Outcome Measure Help needed turning from your back to your side while in a flat bed without using bedrails?: Total Help needed moving from lying on your back to sitting on the side of a flat bed without using bedrails?: Total Help needed moving to and from a bed to a chair (including a wheelchair)?: Total Help needed standing up from a chair using your arms (e.g., wheelchair or bedside chair)?: Total Help needed to walk in hospital room?: Total Help needed climbing 3-5 steps with a railing? : Total 6 Click Score: 6    End of Session   Activity Tolerance: Patient limited by pain Patient left: in bed;with call bell/phone within reach;with bed alarm set Nurse Communication: Need for lift equipment;Other (comment) (not a PT candidate at this tie) PT Visit Diagnosis: Pain Pain - Right/Left:  (bil) Pain - part of body: Leg    Time: 3716-9678 PT Time Calculation (min) (ACUTE ONLY): 15 min   Charges:   PT Evaluation $PT Eval Low Complexity: [name], PT Acute Rehabilitation Services  Office [phone]   Rexanne Mano 06/20/2022, 9:29 AM

## 2022-06-20 NOTE — Progress Notes (Signed)
Initial Nutrition Assessment  DOCUMENTATION CODES:  Severe malnutrition in context of social or environmental circumstances, Underweight  INTERVENTION:  Continue current diet as ordered Adjust to automatic trays Boot Plus TID to provide 360kcal and 14g of protein per serving 1 packet Juven BID, each packet provides 95 calories, 2.5 grams of protein (collagen), and 9.8 grams of carbohydrate (3 grams sugar); also contains 7 grams of L-arginine and L-glutamine, 300 mg vitamin C, 15 mg vitamin E, 1.2 mcg vitamin B-12, 9.5 mg zinc, 200 mg calcium, and 1.5 g  Calcium Beta-hydroxy-Beta-methylbutyrate to support wound healing MVI with minerals daily  NUTRITION DIAGNOSIS:  Severe Malnutrition related to social / environmental circumstances (TBI and nursing facility resident) as evidenced by severe fat depletion, severe muscle depletion.  GOAL:  Patient will meet greater than or equal to 90% of their needs  MONITOR:  PO intake, Supplement acceptance, Skin, I & O's, Labs  REASON FOR ASSESSMENT:  Malnutrition Screening Tool (low BMI, wounds)    ASSESSMENT:  Pt with hx of TBI with cognitive impairment, hx EtOH abuse, hx AKA, HTN, and HLD presented to ED with AMS. MRI showed osteomyelitis of the left foot.  12/26 - Op, revision of right AKA, new left AKA  Pt resting in bed at the time of assessment. Awake and alert but very fidgety. Pulling on bandages and blankets. Pt reports that he has been eating "very good" and flowsheet does show this as well. Bandages present to bilateral thighs from new L AKA and revision of the right.   Adjusted BMI and IBW. Pt is underweight and meets criteria for severe malnutrition. Will adjust to automatic trays and add nutrition supplements. Pt reports that he likes chocolate.   Average Meal Intake: 12/23-12/25: 77% intake x 3 recorded meals  Nutritionally Relevant Medications: Scheduled Meds:  pravastatin  80 mg Oral QHS   Continuous Infusions:   piperacillin-tazobactam (ZOSYN)  IV 3.375 g (06/20/22 0852)   vancomycin 750 mg (06/20/22 0859)   PRN Meds: ondansetron  Labs Reviewed: Na 132 CBG ranges from 120-155 mg/dL over the last 24 hours  NUTRITION - FOCUSED PHYSICAL EXAM: Flowsheet Row Most Recent Value  Orbital Region Moderate depletion  Upper Arm Region Severe depletion  Thoracic and Lumbar Region Severe depletion  Buccal Region Moderate depletion  Temple Region Moderate depletion  Clavicle Bone Region Moderate depletion  Clavicle and Acromion Bone Region Severe depletion  Scapular Bone Region Severe depletion  Dorsal Hand Moderate depletion  Patellar Region Unable to assess  Anterior Thigh Region Unable to assess  Posterior Calf Region Unable to assess  Edema (RD Assessment) None  Hair Reviewed  Eyes Reviewed  Mouth Reviewed  Skin Reviewed  Nails Reviewed   Diet Order:   Diet Order             DIET DYS 3 Room service appropriate? No; Fluid consistency: Thin  Diet effective now                   EDUCATION NEEDS:  Not appropriate for education at this time  Skin:  Skin Assessment: Reviewed RN Assessment (surgical incisions from bilateral AKA)  Last BM:  12/25 - type 5  Height:  Ht Readings from Last 1 Encounters:  06/19/22 '5\' 8"'$  (1.727 m)    Weight:  Wt Readings from Last 1 Encounters:  06/20/22 38.4 kg    Ideal Body Weight:  56 kg (adjusted by 20% for bilateral AKA)  BMI:  Body mass index is 16.1 kg/m. (Adjusted  by 20%, 12/27 wt)  Estimated Nutritional Needs:  Kcal:  1500-1700 kcal/d Protein:  75-90g/d Fluid:  >/=1.5L/d    Ranell Patrick, RD, LDN Clinical Dietitian RD pager # available in [facility]  After hours/weekend pager # available in [facility]

## 2022-06-20 NOTE — Progress Notes (Signed)
PROGRESS NOTE    [name]  TLX:[id number] DOB: Jun 03, 1956 DOA: 06/14/2022 PCP: Jeanette Caprice, PA-C     Brief Narrative:  [name] is a 66 y.o. 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 Patient presents to ED from NH after family visited patient and noted that he was not his usual self. In addition there was question of a possible ground level fall, of note no evidence of fall or visible injury was noted per NH. Due to these issues, at family request patient was transferred to ED by EMS. Initially thought his AMS was due to UTI. He was started on rocephin. However, due to patient's chronic left heel wound, osteomyelitis was also considered. He underwent MRI 12/23 which revealed osteomyelitis.   Subjective: Patient seen after he returned from the OR.  Still under the effects of anesthesia.  His sister was at the bedside.    Assessment & Plan:   Acute metabolic encephalopathy, in setting of underlying TBI, cognitive memory loss Could be secondary to infection, supratherapeutic phenytoin level. Neurontin and Remeron were placed on hold. Mentation appears to have improved.  Phenytoin dose was decreased.  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 Trula Slade. No re-vascularization options noted from outpatient note -Aspirin, plavix outpatient meds -CRP 17.4, sed rate 100 -Vancomycin, Zosyn - discontinue in the next 24h given clean margins post operatively 12/26 -MRI positive for posterior heel ulcer with underlying osteomyelitis.  -AKA 12/26 w/ vascular surgery  UTI ruled out  -Urine culture only showing 1000  colonies corynebacterium species   Seizure disorder/Supratherapeutic phenytoin level -Phenytoin level normalized.  Decreased dose to resume at '100mg'$  in AM and '200mg'$  qhs  Hyponatremia Improved.  Noted to be on normal saline infusion.  Cut back on the rate.  Normocytic anemia Hemoglobin stable.  No evidence for overt bleeding.  Likely anemia of chronic disease.  Labs tomorrow.  Elevated D Dimer -No clinical sign of PE, remains stable on room air   Essential hypertension High readings noted.  Noted to be on atenolol which is being continued.  Hyperlipidemia  -Pravachol   DVT prophylaxis: Subcutaneous heparin Code Status: Full Family Communication: Sister updated Disposition Plan: Here from skilled nursing facility.  Anticipate discharge back to SNF when medically improved.  Status is: Inpatient Remains inpatient appropriate because: Osteomyelitis of the left heel requiring surgery and IV antibiotics  Antimicrobials:  Anti-infectives (From admission, onward)    Start     Dose/Rate Route Frequency Ordered Stop   06/17/22 1000  vancomycin (VANCOREADY) IVPB 750 mg/150 mL        750 mg 150 mL/hr over 60 Minutes Intravenous Every 24 hours 06/16/22 0752     06/16/22 0900  vancomycin (VANCOCIN) IVPB 1000 mg/200 mL premix        1,000 mg 200 mL/hr over 60 Minutes Intravenous  Once 06/16/22 0752 06/16/22 0959   06/16/22 0800  piperacillin-tazobactam (ZOSYN) IVPB 3.375 g        3.375 g 12.5 mL/hr over 240 Minutes Intravenous Every 8 hours 06/16/22 0728     06/15/22 2100  cefTRIAXone (ROCEPHIN) 1 g in sodium chloride 0.9 % 100 mL IVPB  Status:  Discontinued        1 g 200 mL/hr over 30 Minutes Intravenous Every 24 hours 06/15/22 0335 06/16/22 0728   06/14/22 1845  cefTRIAXone (ROCEPHIN) 1 g in sodium chloride 0.9 % 100 mL IVPB        1 g 200 mL/hr over 30 Minutes Intravenous  Once 06/14/22 1832 06/14/22 2116      Objective: Vitals:   06/19/22 2053 06/20/22 0005 06/20/22 0433  06/20/22 0700  BP: 132/60 (!) 173/72 (!) 173/78   Pulse: 98 99 (!) 103   Resp: '20 18 20   '$ Temp: 99.5 F (37.5 C) 99.3 F (37.4 C) 99.6 F (37.6 C)   TempSrc: Oral Oral Oral   SpO2: 100% 100% 100%   Weight:    38.4 kg  Height:        Intake/Output Summary (Last 24 hours) at 06/20/2022 0746 Last data filed at 06/19/2022 1330 Gross per 24 hour  Intake 1430 ml  Output 150 ml  Net 1280 ml    Filed Weights   06/18/22 0532 06/19/22 0459 06/20/22 0700  Weight: 43 kg 41.5 kg 38.4 kg   Examination:  General appearance: Awake alert.  In no distress.  Distracted Resp: Clear to auscultation bilaterally.  Normal effort Cardio: S1-S2 is normal regular.  No S3-S4.  No rubs murmurs or bruit GI: Abdomen is soft.  Nontender nondistended.  Bowel sounds are present normal.  No masses organomegaly Extremities: Bilateral AKA stumps noted.  Data Reviewed: I have personally reviewed following labs and imaging studies  CBC: Recent Labs  Lab 06/14/22 1548 06/14/22 2301 06/16/22 0701 06/17/22 0545 06/18/22 0304 06/19/22 0428 06/19/22 0944 06/20/22 0106  WBC 10.5   < > 10.9* 10.9* 11.8* 12.1*  --  13.2*  NEUTROABS 6.8  --   --   --   --   --   --   --   HGB 10.9*   < > 9.7* 9.3* 10.3* 9.4* 7.8* 7.4*  HCT 34.4*   < > 30.3* 28.7* 31.9* 29.4* 23.0* 21.8*  MCV 88.9   < > 86.3 86.7 87.2 87.8  --  84.2  PLT 436*   < > 412* 414* 465* 479*  --  349   < > = values in this interval not displayed.    Basic Metabolic Panel: Recent Labs  Lab 06/16/22 0701 06/17/22 0545 06/18/22 0304 06/19/22 0428 06/19/22 0944 06/20/22 0106  NA 132* 133* 134* 137 135 132*  K 3.5 3.5 3.7 3.8 3.5 3.9  CL 100 101 100 106  --  103  CO2 24 23 20* 24  --  21*  GLUCOSE 105* 109* 114* 107*  --  120*  BUN '10 13 8 '$ 5*  --  8  CREATININE 0.61 0.77 0.77 0.71  --  0.73  CALCIUM 8.5* 8.6* 8.5* 8.4*  --  7.8*    GFR: Estimated Creatinine Clearance: 50.7 mL/min (by C-G formula based on SCr of 0.73 mg/dL).  Liver  Function Tests: Recent Labs  Lab 06/14/22 1548 06/15/22 0121  AST 32 37  ALT 23 25  ALKPHOS 91 91  BILITOT 0.3 0.5  PROT 7.8 7.7  ALBUMIN 2.8* 2.9*     Recent Labs  Lab 06/14/22 1548 06/14/22 2301  AMMONIA 18 27    CBG: Recent Labs  Lab 06/16/22 0724 06/18/22 0812 06/19/22 1627 06/19/22 2152 06/20/22 0708  GLUCAP 99 102* 155* 120* 133*     Sepsis Labs: Recent Labs  Lab 06/14/22 1548 06/14/22 1840  06/14/22 2056  LATICACIDVEN 2.4* 2.0* 1.0     Recent Results (from the past 240 hour(s))  Urine Culture     Status: Abnormal (Preliminary result)   Collection Time: 06/14/22  3:37 PM   Specimen: Urine, Clean Catch  Result Value Ref Range Status   Specimen Description   Final    URINE, CLEAN CATCH Performed at [facility], [place] [address], [place], [place] [address]    Special Requests   Final    NONE Performed at [facility], [place] [address], [place], [place] [address]    Culture (A)  Final    1,000 COLONIES/mL DIPHTHEROIDS(CORYNEBACTERIUM SPECIES) >=100,000 COLONIES/mL VIRIDANS STREPTOCOCCUS Standardized susceptibility testing for this organism is not available. Performed at [facility], [place] [address], [place], [place] [address]    Report Status PENDING  Incomplete      Radiology Studies: DG FEMUR 1V LEFT  Result Date: 06/19/2022 CLINICAL DATA:  Follow-up left above the knee amputation EXAM: LEFT FEMUR 1 VIEW COMPARISON:  None FINDINGS: Above the knee amputation of the left leg about 10 cm below the trochanteric region. Residual femoral hardware. No unexpected radiographic finding. IMPRESSION: Above the knee amputation of the left leg about 10 cm below the trochanteric region. Electronically Signed   By: Nelson Chimes M.D.   On: 06/19/2022 10:28      Scheduled Meds:  aspirin EC  81 mg Oral Daily   atenolol  25 mg Oral Daily   busPIRone  10 mg Oral TID   clopidogrel  75 mg Oral Daily   heparin  5,000  Units Subcutaneous Q8H   phenytoin  100 mg Oral Daily   phenytoin  200 mg Oral QHS   pravastatin  80 mg Oral QHS   Continuous Infusions:  piperacillin-tazobactam (ZOSYN)  IV 3.375 g (06/19/22 2359)   vancomycin 750 mg (06/18/22 0955)     LOS: 6 days    Little Ishikawa, DO [facility] 06/20/2022, 7:46 AM

## 2022-06-20 NOTE — Progress Notes (Addendum)
  Progress Note  VASCULAR SURGERY ASSESSMENT & PLAN:   POD 1 REVISION RIGHT AKA / LEFT AKA: His dressings are dry.  We will plan for his first dressing change on Friday.  Updated sister.  Gae Gallop, MD 2:08 PM    06/20/2022 6:48 AM 1 Day Post-Op  Subjective:  resting comfortably; no complaints.  Sister at bedside.  Tm 99.6  Vitals:   06/20/22 0005 06/20/22 0433  BP: (!) 173/72 (!) 173/78  Pulse: 99 (!) 103  Resp: 18 20  Temp: 99.3 F (37.4 C) 99.6 F (37.6 C)  SpO2: 100% 100%    Physical Exam: Incisions:  bilateral AKA dressing in place and in tact.     CBC    Component Value Date/Time   WBC 13.2 (H) 06/20/2022 0106   RBC 2.59 (L) 06/20/2022 0106   HGB 7.4 (L) 06/20/2022 0106   HCT 21.8 (L) 06/20/2022 0106   PLT 349 06/20/2022 0106   MCV 84.2 06/20/2022 0106   MCH 28.6 06/20/2022 0106   MCHC 33.9 06/20/2022 0106   RDW 14.2 06/20/2022 0106   LYMPHSABS 2.2 06/14/2022 1548   MONOABS 1.2 (H) 06/14/2022 1548   EOSABS 0.2 06/14/2022 1548   BASOSABS 0.0 06/14/2022 1548    BMET    Component Value Date/Time   NA 132 (L) 06/20/2022 0106   NA 141 08/02/2021 1356   K 3.9 06/20/2022 0106   CL 103 06/20/2022 0106   CO2 21 (L) 06/20/2022 0106   GLUCOSE 120 (H) 06/20/2022 0106   BUN 8 06/20/2022 0106   BUN 20 08/02/2021 1356   CREATININE 0.73 06/20/2022 0106   CREATININE 0.73 12/31/2014 1330   CALCIUM 7.8 (L) 06/20/2022 0106   GFRNONAA >60 06/20/2022 0106   GFRNONAA >89 10/23/2011 1504   GFRAA >60 11/16/2019 1600   GFRAA >89 10/23/2011 1504    INR    Component Value Date/Time   INR 1.1 11/09/2021 1940     Intake/Output Summary (Last 24 hours) at 06/20/2022 0648 Last data filed at 06/19/2022 1330 Gross per 24 hour  Intake 1430 ml  Output 150 ml  Net 1280 ml     Assessment/Plan:  66 y.o. male is s/p left above knee amputation & revision of right above knee amputation 1 Day Post-Op   -doing well this morning.  No complaints of pain.    -dressings are clean and in tact; will leave dressing for a couple more days to help prevent spoilage of incisions.      [name], PA-C [facility] [phone] 06/20/2022 6:48 AM

## 2022-06-20 NOTE — TOC Progression Note (Signed)
Transition of Care [facility]) - Progression Note    Patient Details  Name: FULTON MERRY MRN: [id number] Date of Birth: 1956-02-02  [facility] CM/SW Contact  Jinger Neighbors, [place] Phone Number: 06/20/2022, 1:56 PM  Clinical Narrative:     CSW conversed with Crystal at [facility] who reports pt can return when medically stable, most likely 12/28  Expected Discharge Plan: [place] Barriers to Discharge: No Barriers Identified  Expected Discharge Plan and Services In-house Referral: NA Discharge Planning Services: CM Consult Post Acute Care Choice: Nursing Home, [place] Living arrangements for the past 2 months: [place]                 DME Arranged: N/A DME Agency: NA                   Social Determinants of Health (SDOH) Interventions SDOH Screenings   Food Insecurity: No Food Insecurity (06/15/2022)  Housing: Low Risk  (06/15/2022)  Transportation Needs: No Transportation Needs (06/15/2022)  Utilities: Not At Risk (06/15/2022)  Tobacco Use: Medium Risk (06/20/2022)    Readmission Risk Interventions    06/15/2022   11:08 AM 02/14/2022   12:07 PM  Readmission Risk Prevention Plan  Transportation Screening Complete Complete  PCP or Specialist Appt within 5-7 Days Complete   PCP or Specialist Appt within 3-5 Days  Complete  Home Care Screening Complete   Medication Review (RN CM) Complete   HRI or Home Care Consult  Complete  Social Work Consult for [place] Planning/Counseling  Complete  Palliative Care Screening  Complete  Medication Review Press photographer)  Complete

## 2022-06-21 DIAGNOSIS — G9341 Metabolic encephalopathy: Secondary | ICD-10-CM | POA: Diagnosis not present

## 2022-06-21 LAB — GLUCOSE, CAPILLARY: Glucose-Capillary: 116 mg/dL — ABNORMAL HIGH (ref 70–99)

## 2022-06-21 NOTE — Progress Notes (Signed)
PROGRESS NOTE    [name]  FBP:[id number] DOB: [birth year] DOA: 06/14/2022 PCP: Jeanette Caprice, PA-C     Brief Narrative:  [name] is a 66 y.o. 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 Patient presents to ED from NH after family visited patient and noted that he was not his usual self. In addition there was question of a possible ground level fall, of note no evidence of fall or visible injury was noted per NH. Due to these issues, at family request patient was transferred to ED by EMS. Initially thought his AMS was due to UTI. He was started on rocephin. However, due to patient's chronic left heel wound, osteomyelitis was also considered. He underwent MRI 12/23 which revealed osteomyelitis.   Subjective: No acute issues or events overnight, more awake alert and oriented today, sister at bedside agrees he is closer to baseline than previous.  Assessment & Plan:   Acute metabolic encephalopathy, in setting of underlying TBI, cognitive memory loss, resolving Cannot rule out polypharmacy Could be secondary to infection, supratherapeutic phenytoin level. Neurontin and Remeron were placed on hold -consider discontinuation at discharge Mentation appears to have improved.  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 Trula Slade. No re-vascularization options noted from outpatient note -Aspirin, plavix outpatient meds -CRP 17.4, sed rate 100 -Vancomycin, Zosyn - discontinue in the next 24h given clean margins post operatively 12/26 -MRI positive for posterior heel ulcer with underlying osteomyelitis.  -AKA 12/26 w/  vascular surgery  UTI ruled out  -Urine culture only showing 1000 colonies corynebacterium species   Seizure disorder/Supratherapeutic phenytoin level -Phenytoin level normalized.  Decreased dose to resume at '100mg'$  in AM and '200mg'$  qhs  Hyponatremia Improved.  Noted to be on normal saline infusion.  Cut back on the rate.  Normocytic anemia Hemoglobin stable.  No evidence for overt bleeding.  Likely anemia of chronic disease.  Labs tomorrow.  Elevated D Dimer -No clinical sign of PE, remains stable on room air   Essential hypertension High readings noted.  Noted to be on atenolol which is being continued.  Hyperlipidemia  -Pravachol   DVT prophylaxis: Subcutaneous heparin Code Status: Full Family Communication: Sister updated at length at bedside, she voices concern about being discharged "too soon" and does not have faith his current facility can perform dressing changes appropriately.  Disposition Plan: Return to nursing facility where he currently resides pending surgical clearance.  Status is: Inpatient Remains inpatient appropriate because: Osteomyelitis of the left heel requiring surgery and IV antibiotics  Antimicrobials:  Anti-infectives (From admission, onward)    Start     Dose/Rate Route Frequency Ordered Stop   06/17/22 1000  vancomycin (VANCOREADY) IVPB 750 mg/150 mL        750 mg 150 mL/hr over 60 Minutes Intravenous Every 24 hours 06/16/22 0752 06/20/22 2359   06/16/22 0900  vancomycin (VANCOCIN) IVPB 1000 mg/200 mL premix        1,000 mg 200 mL/hr over 60 Minutes Intravenous  Once 06/16/22 0752 06/16/22 0959   06/16/22 0800  piperacillin-tazobactam (ZOSYN) IVPB 3.375 g        3.375 g 12.5 mL/hr over 240 Minutes Intravenous Every  8 hours 06/16/22 0728 06/20/22 2113   06/15/22 2100  cefTRIAXone (ROCEPHIN) 1 g in sodium chloride 0.9 % 100 mL IVPB  Status:  Discontinued        1 g 200 mL/hr over 30 Minutes Intravenous Every 24 hours 06/15/22 0335 06/16/22 0728    06/14/22 1845  cefTRIAXone (ROCEPHIN) 1 g in sodium chloride 0.9 % 100 mL IVPB        1 g 200 mL/hr over 30 Minutes Intravenous  Once 06/14/22 1832 06/14/22 2116      Objective: Vitals:   06/20/22 1943 06/20/22 2317 06/21/22 0337 06/21/22 0651  BP: (!) 168/75 (!) 165/69 (!) 140/57   Pulse: 98 89 90   Resp: '15 16 17   '$ Temp: 98.9 F (37.2 C) 99.3 F (37.4 C) 98.4 F (36.9 C)   TempSrc: Oral Oral Oral   SpO2: 100% 98% 100%   Weight:    36.5 kg  Height:        Intake/Output Summary (Last 24 hours) at 06/21/2022 0744 Last data filed at 06/20/2022 2130 Gross per 24 hour  Intake 571.34 ml  Output --  Net 571.34 ml    Filed Weights   06/19/22 0459 06/20/22 0700 06/21/22 0651  Weight: 41.5 kg 38.4 kg 36.5 kg   Examination:  General appearance: Awake alert.  In no distress.  Distracted Resp: Clear to auscultation bilaterally.  Normal effort Cardio: S1-S2 is normal regular.  No S3-S4.  No rubs murmurs or bruit GI: Abdomen is soft.  Nontender nondistended.  Bowel sounds are present normal.  No masses organomegaly Extremities: Bilateral AKA stumps noted.  Data Reviewed: I have personally reviewed following labs and imaging studies  CBC: Recent Labs  Lab 06/14/22 1548 06/14/22 2301 06/16/22 0701 06/17/22 0545 06/18/22 0304 06/19/22 0428 06/19/22 0944 06/20/22 0106  WBC 10.5   < > 10.9* 10.9* 11.8* 12.1*  --  13.2*  NEUTROABS 6.8  --   --   --   --   --   --   --   HGB 10.9*   < > 9.7* 9.3* 10.3* 9.4* 7.8* 7.4*  HCT 34.4*   < > 30.3* 28.7* 31.9* 29.4* 23.0* 21.8*  MCV 88.9   < > 86.3 86.7 87.2 87.8  --  84.2  PLT 436*   < > 412* 414* 465* 479*  --  349   < > = values in this interval not displayed.    Basic Metabolic Panel: Recent Labs  Lab 06/16/22 0701 06/17/22 0545 06/18/22 0304 06/19/22 0428 06/19/22 0944 06/20/22 0106  NA 132* 133* 134* 137 135 132*  K 3.5 3.5 3.7 3.8 3.5 3.9  CL 100 101 100 106  --  103  CO2 24 23 20* 24  --  21*  GLUCOSE 105*  109* 114* 107*  --  120*  BUN '10 13 8 '$ 5*  --  8  CREATININE 0.61 0.77 0.77 0.71  --  0.73  CALCIUM 8.5* 8.6* 8.5* 8.4*  --  7.8*    GFR: Estimated Creatinine Clearance: 48.2 mL/min (by C-G formula based on SCr of 0.73 mg/dL).  Liver Function Tests: Recent Labs  Lab 06/14/22 1548 06/15/22 0121  AST 32 37  ALT 23 25  ALKPHOS 91 91  BILITOT 0.3 0.5  PROT 7.8 7.7  ALBUMIN 2.8* 2.9*     Recent Labs  Lab 06/14/22 1548 06/14/22 2301  AMMONIA 18 27    CBG: Recent Labs  Lab 06/19/22 1627 06/19/22 2152 06/20/22 0708  06/20/22 2330 06/21/22 0628  GLUCAP 155* 120* 133* 131* 116*     Sepsis Labs: Recent Labs  Lab 06/14/22 1548 06/14/22 1840 06/14/22 2056  LATICACIDVEN 2.4* 2.0* 1.0     Recent Results (from the past 240 hour(s))  Urine Culture     Status: Abnormal   Collection Time: 06/14/22  3:37 PM   Specimen: Urine, Clean Catch  Result Value Ref Range Status   Specimen Description   Final    URINE, CLEAN CATCH Performed at [facility], [place] [address], [place], [place] [address]    Special Requests   Final    NONE Performed at [facility], [place] [address] [place], [place] [address]    Culture (A)  Final    1,000 COLONIES/mL DIPHTHEROIDS(CORYNEBACTERIUM SPECIES) >=100,000 COLONIES/mL VIRIDANS STREPTOCOCCUS Standardized susceptibility testing for this organism is not available. Performed at [facility], [place] [address], [place],  [address]    Report Status 06/20/2022 FINAL  Final      Radiology Studies: DG FEMUR 1V LEFT  Result Date: 06/19/2022 CLINICAL DATA:  Follow-up left above the knee amputation EXAM: LEFT FEMUR 1 VIEW COMPARISON:  None FINDINGS: Above the knee amputation of the left leg about 10 cm below the trochanteric region. Residual femoral hardware. No unexpected radiographic finding. IMPRESSION: Above the knee amputation of the left leg about 10 cm below the trochanteric region.  Electronically Signed   By: Nelson Chimes M.D.   On: 06/19/2022 10:28      Scheduled Meds:  aspirin EC  81 mg Oral Daily   atenolol  25 mg Oral Daily   busPIRone  10 mg Oral TID   clopidogrel  75 mg Oral Daily   heparin  5,000 Units Subcutaneous Q8H   lactose free nutrition  237 mL Oral TID WC   multivitamin with minerals  1 tablet Oral Daily   nutrition supplement (JUVEN)  1 packet Oral BID BM   phenytoin  100 mg Oral Daily   phenytoin  200 mg Oral QHS   pravastatin  80 mg Oral QHS   Continuous Infusions:     LOS: 7 days    Little Ishikawa, DO [facility] 06/21/2022, 7:44 AM

## 2022-06-21 NOTE — Progress Notes (Signed)
Mobility Specialist: Progress Note   06/21/22 1730  Mobility  Activity Transferred from chair to bed  Level of Assistance +2 (takes two people)  Assistive Device MaxiMove  Activity Response Tolerated well  $Mobility charge 1 Mobility   Pt assisted back to bed per request with help from RN. C/o BLE pain, no rating given. No c/o dizziness. Pt back in bed with call bell at his side. Bed alarm is on.   [place] Samir Ishaq Mobility Specialist Please contact via SecureChat or Rehab office at [phone]

## 2022-06-21 NOTE — Progress Notes (Signed)
Mobility Specialist: Progress Note   06/21/22 1451  Mobility  Activity Transferred from bed to chair  Level of Assistance +2 (takes two people)  Assistive Device MaxiMove  Activity Response Tolerated well  $Mobility charge 1 Mobility   Pt received in the bed and assisted to the chair per request with help from RN. Co 6/10 LLE pain, RN aware. No c/o dizziness or SOB. Pt is in the chair with call bell in reach. Chair alarm is on.   [place] Tykeria Wawrzyniak Mobility Specialist Please contact via SecureChat or Rehab office at [phone]

## 2022-06-21 NOTE — Progress Notes (Addendum)
  Progress Note VASCULAR SURGERY ASSESSMENT & PLAN:   POD 2 REVISION RIGHT AKA / LEFT AKA: His dressings are dry.  We will plan for his first dressing change tomorrow.   Gae Gallop, MD 7:28 AM    06/21/2022 7:09 AM 2 Days Post-Op  Subjective:  no complaints; resting comfortably.  When asked how he's doing he says "I'm alive".  Says his pain is controlled.  Tm 100.9 now afebrile  Vitals:   06/20/22 2317 06/21/22 0337  BP: (!) 165/69 (!) 140/57  Pulse: 89 90  Resp: 16 17  Temp: 99.3 F (37.4 C) 98.4 F (36.9 C)  SpO2: 98% 100%    Physical Exam: Incisions:  bandages in place and are clean and dry  CBC    Component Value Date/Time   WBC 13.2 (H) 06/20/2022 0106   RBC 2.59 (L) 06/20/2022 0106   HGB 7.4 (L) 06/20/2022 0106   HCT 21.8 (L) 06/20/2022 0106   PLT 349 06/20/2022 0106   MCV 84.2 06/20/2022 0106   MCH 28.6 06/20/2022 0106   MCHC 33.9 06/20/2022 0106   RDW 14.2 06/20/2022 0106   LYMPHSABS 2.2 06/14/2022 1548   MONOABS 1.2 (H) 06/14/2022 1548   EOSABS 0.2 06/14/2022 1548   BASOSABS 0.0 06/14/2022 1548   BMET    Component Value Date/Time   NA 132 (L) 06/20/2022 0106   NA 141 08/02/2021 1356   K 3.9 06/20/2022 0106   CL 103 06/20/2022 0106   CO2 21 (L) 06/20/2022 0106   GLUCOSE 120 (H) 06/20/2022 0106   BUN 8 06/20/2022 0106   BUN 20 08/02/2021 1356   CREATININE 0.73 06/20/2022 0106   CREATININE 0.73 12/31/2014 1330   CALCIUM 7.8 (L) 06/20/2022 0106   GFRNONAA >60 06/20/2022 0106   GFRNONAA >89 10/23/2011 1504   GFRAA >60 11/16/2019 1600   GFRAA >89 10/23/2011 1504   INR  Assessment/Plan:  66 y.o. 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  Pain controlled -dressings bilateral stumps are clean and dry-will take down dressings tomorrow  [name], PA-C [facility] [phone] 06/21/2022 7:09 AM

## 2022-06-22 ENCOUNTER — Other Ambulatory Visit (HOSPITAL_COMMUNITY): Payer: Self-pay

## 2022-06-22 DIAGNOSIS — G9341 Metabolic encephalopathy: Secondary | ICD-10-CM | POA: Diagnosis not present

## 2022-06-22 LAB — BPAM RBC
Blood Product Expiration Date: 202401172359
Blood Product Expiration Date: 202401172359
Blood Product Expiration Date: 202401172359
ISSUE DATE / TIME: 202312221101
ISSUE DATE / TIME: 202312261023
ISSUE DATE / TIME: 202312261023
Unit Type and Rh: 6200
Unit Type and Rh: 6200
Unit Type and Rh: 6200

## 2022-06-22 LAB — TYPE AND SCREEN
ABO/RH(D): A POS
Antibody Screen: NEGATIVE
Unit division: 0
Unit division: 0
Unit division: 0

## 2022-06-22 LAB — GLUCOSE, CAPILLARY: Glucose-Capillary: 110 mg/dL — ABNORMAL HIGH (ref 70–99)

## 2022-06-22 LAB — PHENYTOIN LEVEL, TOTAL: Phenytoin Lvl: 6.5 ug/mL — ABNORMAL LOW (ref 10.0–20.0)

## 2022-06-22 LAB — SURGICAL PATHOLOGY

## 2022-06-22 MED ORDER — PHENYTOIN SODIUM EXTENDED 100 MG PO CAPS
100.0000 mg | ORAL_CAPSULE | ORAL | 0 refills | Status: DC
  Filled 2022-06-22: qty 90, fill #0

## 2022-06-22 NOTE — Progress Notes (Addendum)
Phenytoin Follow-Up Consult Indication: seizure disorder  Allergies  Allergen Reactions   Isovue [Iopamidol] Hives    Pt broke out in one hive on his chest after contrast on 01/03/15.  Pt will need full premeds in the future per Dr Martinique.      Patient Measurements: Height: '5\' 8"'$  (172.7 cm) Weight: 38.3 kg (84 lb 7 oz) IBW/kg (Calculated) : 68.4   Body mass index is 12.84 kg/m.   Vital signs: Temp: 98.4 F (36.9 C) (12/29 0832) Temp Source: Oral (12/29 0832) BP: 150/71 (12/29 0832) Pulse Rate: 100 (12/29 0832)  Labs: Lab Results  Component Value Date/Time   Phenytoin Lvl 6.5 (L) 06/22/2022 0906   Lab Results  Component Value Date   PHENYTOIN 6.5 (L) 06/22/2022   Estimated Creatinine Clearance: 50.5 mL/min (by C-G formula based on SCr of 0.73 mg/dL).   Medications:  Medications Prior to Admission  Medication Sig Dispense Refill Last Dose   acetaminophen (TYLENOL) 500 MG tablet Take 1,000 mg by mouth daily.   06/14/2022   Amino Acids-Protein Hydrolys (FEEDING SUPPLEMENT, PRO-STAT 64,) LIQD Take 30 mLs by mouth 2 (two) times daily.   06/14/2022   aspirin EC 81 MG tablet Take 1 tablet (81 mg total) by mouth daily. 150 tablet 11 06/14/2022   atenolol (TENORMIN) 50 MG tablet Take 1 tablet (50 mg total) by mouth daily. (Patient taking differently: Take 25 mg by mouth daily.) 30 tablet 2 06/14/2022 at 0900   busPIRone (BUSPAR) 10 MG tablet Take 10 mg by mouth 3 (three) times daily.   06/14/2022   cholecalciferol (GNP VITAMIN D3) 10 MCG (400 UNIT) TABS tablet Take 800 Units by mouth daily.   06/14/2022   clopidogrel (PLAVIX) 75 MG tablet Take 1 tablet (75 mg total) by mouth daily. 30 tablet 11 06/14/2022   gabapentin (NEURONTIN) 100 MG capsule Take 100-200 mg by mouth See admin instructions. 100 mg daily in the  morning and  200 mg at bedtime   06/14/2022   mirtazapine (REMERON) 15 MG tablet Take 7.5 mg by mouth at bedtime.   06/13/2022   phenytoin (DILANTIN INFATABS) 50 MG  tablet Chew 50 mg by mouth daily. Takes in the morning with 100 mg to get 150 mg total   06/14/2022   phenytoin (DILANTIN) 100 MG ER capsule Take 100-200 mg by mouth See admin instructions. Takes 100 mg daily in the morning (with the 50 mg tablet to get '150mg'$  dose); Takes 200 mg at bedtime   06/14/2022   pravastatin (PRAVACHOL) 40 MG tablet Take 80 mg by mouth at bedtime.   06/13/2022   Multiple Vitamins-Minerals (MULTIVITAMIN WITH MINERALS) tablet Take 1 tablet by mouth daily. With Iron   06/13/2022   Scheduled:   aspirin EC  81 mg Oral Daily   atenolol  25 mg Oral Daily   busPIRone  10 mg Oral TID   clopidogrel  75 mg Oral Daily   heparin  5,000 Units Subcutaneous Q8H   lactose free nutrition  237 mL Oral TID WC   multivitamin with minerals  1 tablet Oral Daily   nutrition supplement (JUVEN)  1 packet Oral BID BM   phenytoin  100 mg Oral Daily   phenytoin  200 mg Oral QHS   pravastatin  80 mg Oral QHS    Assessment: Corrected phenytoin level (if needed): 9.56 mcg / mL (based on albumin of 2.9 G / dL drawn 06/15/2022) Seizure activity: none noted in progress notes Significant potential drug interactions: none that  have not been present during his therapy PTA  Goals of care:  Total phenytoin level: 10-20 mcg/ml Free phenytoin level: 1-2 mcg/ml  Plan:  Patient has remained seizure from a review of current notes since 2015. I did send a secure chat to Dr. April Manson MD to alert of this information. We will continue on this dose (100 mg AM and 200 mg HS) unless Neuro contacts the inpatient team to make changes. I have a repeat level ordered as a reduction in serum level seems odd given the dose was reduced by 50 mg a day and level changed by a factor of almost 4. I have signed off of the consult after consultation with the hospitalist but will continue to monitor level ordered for 06/23/2022.   Thank you for the consult   Vaughan Basta BS, PharmD, BCPS Clinical Pharmacist 06/22/2022 10:34  AM  Contact: [phone] after 3 PM  "Be curious, not judgmental..." -Jamal Maes

## 2022-06-22 NOTE — Progress Notes (Signed)
During this RN rounds, pt's IV was in the bed, possibly removed by the patient. IV site is dry, not bleeding. Pt is not agitated but confused, refused to put it back and verbalized he is not in pain.

## 2022-06-22 NOTE — Discharge Summary (Signed)
Physician Discharge Summary  GEVIN PEREA LEX:[id number] DOB: 06/30/1955 DOA: 06/14/2022  PCP: Jeanette Caprice, PA-C  Admit date: 06/14/2022 Discharge date: 06/22/2022  Admitted From: SNF Disposition: Same  Recommendations for Outpatient Follow-up:  Follow up with PCP in 1-2 weeks Follow-up with vascular surgery as scheduled in 4 weeks for wound evaluation  Discharge Condition: Stable CODE STATUS: Full Diet recommendation: Regular diet as tolerated  Brief/Interim Summary: CREWE HEATHMAN is a 66 y.o. 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 Patient presents to ED from NH after family visited patient and noted that he was not his usual self. In addition there was question of a possible ground level fall, of note no evidence of fall or visible injury was noted per NH. Due to these issues, at family request patient was transferred to ED by EMS. Initially thought his AMS was due to UTI. He was started on rocephin. However, due to patient's chronic left heel wound, osteomyelitis was also considered. He underwent MRI 12/23 which revealed osteomyelitis.   Patient evaluated by vascular surgery in the setting of osteomyelitis with no revascularization options subsequently underwent left AKA 12/26 as well as further revision of right AKA.  Given clean margins patient's antibiotics were discontinued no further fevers or white count or issues.  Pain well-controlled.  Otherwise follow-up in 4 weeks with vascular surgery for follow-up and staple removal.  Otherwise stable and agreeable for discharge back to nursing facility where he currently resides long-term.  Of note patient's mental status was noted to  be depressed/altered at intake, unclear if this is due to infection versus polypharmacy.  Patient's home gabapentin and mirtazapine were discontinued and his Dilantin was decreased due to elevated levels.  Recommend close follow-up with PCP to reevaluate chronic medication changes as well as follow-up with neurology as previously scheduled for further Dilantin levels and management.  Discharge Diagnoses:  Principal Problem:   Acute metabolic encephalopathy Active Problems:   Hyponatremia   Seizures (HCC)   Hypertension   TBI (traumatic brain injury) ([place])   Depression   Hyperlipidemia   Dementia without behavioral disturbance (HCC)   PAD (peripheral artery disease) (HCC)   Complicated UTI (urinary tract infection)   UTI (urinary tract infection)   Chronic ulcer of left heel ([place])    Discharge Instructions   Allergies as of 06/22/2022       Reactions   Isovue [iopamidol] Hives   Pt broke out in one hive on his chest after contrast on 01/03/15.  Pt will need full premeds in the future per Dr Martinique.          Medication List     STOP taking these medications    Dilantin Infatabs 50 MG tablet Generic drug: phenytoin   gabapentin 100 MG capsule Commonly known as: NEURONTIN   mirtazapine 15 MG tablet Commonly known as: REMERON       TAKE these medications    acetaminophen 500 MG tablet Commonly known as: TYLENOL Take 1,000 mg by mouth daily.   aspirin EC 81 MG tablet Take 1 tablet (81 mg total) by mouth daily.   atenolol 50 MG tablet Commonly known as: TENORMIN Take 1 tablet (50 mg total) by mouth daily. What changed: how  much to take   busPIRone 10 MG tablet Commonly known as: BUSPAR Take 10 mg by mouth 3 (three) times daily.   clopidogrel 75 MG tablet Commonly known as: Plavix Take 1 tablet (75 mg total) by mouth daily.   feeding supplement (PRO-STAT 64) Liqd Take 30 mLs by mouth 2 (two) times daily.   GNP Vitamin D3 10 MCG (400 UNIT) Tabs  tablet Generic drug: cholecalciferol Take 800 Units by mouth daily.   multivitamin with minerals tablet Take 1 tablet by mouth daily. With Iron   phenytoin 100 MG ER capsule Commonly known as: DILANTIN Take 1-2 capsules (100-200 mg total) by mouth See admin instructions. Take 100 mg daily in the morning; Take 200 mg at bedtime What changed: additional instructions   pravastatin 40 MG tablet Commonly known as: PRAVACHOL Take 80 mg by mouth at bedtime.        Follow-up Information     [facility] Follow up in 4 week(s).   Specialty: Vascular Surgery Why: Office will call you to arrange your appt (sent). Contact information: [place] [id number] [phone]               Allergies  Allergen Reactions   Isovue [Iopamidol] Hives    Pt broke out in one hive on his chest after contrast on 01/03/15.  Pt will need full premeds in the future per Dr Martinique.      Consultations: Vascular surgery  Procedures/Studies: DG FEMUR 1V LEFT  Result Date: 06/19/2022 CLINICAL DATA:  Follow-up left above the knee amputation EXAM: LEFT FEMUR 1 VIEW COMPARISON:  None FINDINGS: Above the knee amputation of the left leg about 10 cm below the trochanteric region. Residual femoral hardware. No unexpected radiographic finding. IMPRESSION: Above the knee amputation of the left leg about 10 cm below the trochanteric region. Electronically Signed   By: Nelson Chimes M.D.   On: 06/19/2022 10:28   MR HEEL LEFT WO CONTRAST  Result Date: 06/16/2022 CLINICAL DATA:  Osteomyelitis suspected, foot, no prior imaging EXAM: MR OF THE LEFT HEEL WITHOUT CONTRAST TECHNIQUE: Multiplanar, multisequence MR imaging of the left heel was performed. No intravenous contrast was administered. COMPARISON:  Left foot radiograph 06/18/2021 FINDINGS: Extensive motion artifact degrades image quality. Bones/Joint/Cartilage There is subcortical marrow edema and mildly  low T1 signal within the posterior calcaneus (sagittal stir image 16, axial T1 image 23). There is marrow edema within the lateral malleolus, with preserved T1 marrow signal. There is no tibiotalar or subtalar joint effusion. There is mild tibiotalar and posterior subtalar osteoarthritis with mild periarticular subchondral marrow edema. There is periarticular marrow edema at the tarsometatarsal joints related to arthro arthritis. Ligaments Poorly evaluated due to motion artifact. Muscles and Tendons Poorly evaluated due to extensive motion artifact. No obvious tendon tear or significant tenosynovitis. Soft tissues There is a posterior heel soft tissue ulcer, extending to the surface of the posterior calcaneus. No evidence of soft tissue abscess. IMPRESSION: Extensive motion artifact degrades image quality. Posterior heel soft tissue ulcer with underlying osteomyelitis of the posterior calcaneus. No evidence of soft tissue abscess. Focal marrow edema within the lateral malleolus, with preserved T1 marrow signal. This is nonspecific and could reflect reactive marrow change or early osteomyelitis if there is adjacent lateral soft tissue ulcer, correlate with exam. This could also be posttraumatic. Mild tibiotalar and posterior subtalar osteoarthritis. No significant joint effusion. 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 Electronically Signed   By: Edison Nasuti  Chancy Milroy M.D.   On: 06/16/2022 12:12   CT Head Wo Contrast  Result Date: 06/14/2022 CLINICAL DATA:  Mental status change, unknown cause EXAM: CT HEAD WITHOUT CONTRAST TECHNIQUE: Contiguous axial images were obtained from the base of the skull through the vertex without intravenous contrast. RADIATION DOSE REDUCTION: This exam was performed according to the departmental dose-optimization program which includes automated exposure control, adjustment of the mA and/or kV according to patient size and/or use of iterative reconstruction technique.  COMPARISON:  01/25/2022 FINDINGS: Brain: There is periventricular white matter decreased attenuation consistent with small vessel ischemic changes. Ventricles, sulci and cisterns are prominent consistent with age related involutional changes. No acute intracranial hemorrhage, mass effect or shift. No hydrocephalus. Left periventricular encephalomalacia consistent with an old lacunar CVA. Old lacunar CVA left cerebellum. Vascular: No hyperdense vessel or unexpected calcification. Skull: Normal. Negative for fracture or focal lesion. Sinuses/Orbits: No acute finding. IMPRESSION: Atrophy and chronic small vessel ischemic changes. Chronic lacunar CVAs. No acute intracranial process identified. Electronically Signed   By: Sammie Bench M.D.   On: 06/14/2022 17:28     Subjective: No acute issues or events overnight  Discharge Exam: Vitals:   06/22/22 0832 06/22/22 1203  BP: (!) 150/71 (!) 156/78  Pulse: 100 92  Resp: 16 16  Temp: 98.4 F (36.9 C) 99.5 F (37.5 C)  SpO2: 100% 100%   Vitals:   06/22/22 0336 06/22/22 0500 06/22/22 0832 06/22/22 1203  BP: (!) 160/74  (!) 150/71 (!) 156/78  Pulse: 90  100 92  Resp: '19  16 16  '$ Temp: 98.3 F (36.8 C)  98.4 F (36.9 C) 99.5 F (37.5 C)  TempSrc: Oral  Oral   SpO2: 100%  100% 100%  Weight:  38.3 kg    Height:        General: Pt is alert, awake, not in acute distress Cardiovascular: RRR, S1/S2 +, no rubs, no gallops Respiratory: CTA bilaterally, no wheezing, no rhonchi Abdominal: Soft, NT, ND, bowel sounds + Extremities: no edema, no cyanosis, bilateral AKA  The results of significant diagnostics from this hospitalization (including imaging, microbiology, ancillary and laboratory) are listed below for reference.     Microbiology: Recent Results (from the past 240 hour(s))  Urine Culture     Status: Abnormal   Collection Time: 06/14/22  3:37 PM   Specimen: Urine, Clean Catch  Result Value Ref Range Status   Specimen Description    Final    URINE, CLEAN CATCH Performed at [facility], [place] [address] [place], [place] [address]    Special Requests   Final    NONE Performed at [facility], [place] [address], [place], [place] [address]    Culture (A)  Final    1,000 COLONIES/mL DIPHTHEROIDS(CORYNEBACTERIUM SPECIES) >=100,000 COLONIES/mL VIRIDANS STREPTOCOCCUS Standardized susceptibility testing for this organism is not available. Performed at [facility], [place] [address], [place],  [address]    Report Status 06/20/2022 FINAL  Final     Labs: BNP (last 3 results) No results for input(s): "BNP" in the last 8760 hours. Basic Metabolic Panel: Recent Labs  Lab 06/16/22 0701 06/17/22 0545 06/18/22 0304 06/19/22 0428 06/19/22 0944 06/20/22 0106  NA 132* 133* 134* 137 135 132*  K 3.5 3.5 3.7 3.8 3.5 3.9  CL 100 101 100 106  --  103  CO2 24 23 20* 24  --  21*  GLUCOSE 105* 109* 114* 107*  --  120*  BUN 10 13  8 5*  --  8  CREATININE 0.61 0.77 0.77 0.71  --  0.73  CALCIUM 8.5* 8.6* 8.5* 8.4*  --  7.8*   Liver Function Tests: No results for input(s): "AST", "ALT", "ALKPHOS", "BILITOT", "PROT", "ALBUMIN" in the last 168 hours. No results for input(s): "LIPASE", "AMYLASE" in the last 168 hours. No results for input(s): "AMMONIA" in the last 168 hours. CBC: Recent Labs  Lab 06/16/22 0701 06/17/22 0545 06/18/22 0304 06/19/22 0428 06/19/22 0944 06/20/22 0106  WBC 10.9* 10.9* 11.8* 12.1*  --  13.2*  HGB 9.7* 9.3* 10.3* 9.4* 7.8* 7.4*  HCT 30.3* 28.7* 31.9* 29.4* 23.0* 21.8*  MCV 86.3 86.7 87.2 87.8  --  84.2  PLT 412* 414* 465* 479*  --  349   Cardiac Enzymes: No results for input(s): "CKTOTAL", "CKMB", "CKMBINDEX", "TROPONINI" in the last 168 hours. BNP: Invalid input(s): "POCBNP" CBG: Recent Labs  Lab 06/19/22 2152 06/20/22 0708 06/20/22 2330 06/21/22 0628 06/22/22 0628  GLUCAP 120* 133* 131* 116* 110*   D-Dimer No results for input(s):  "DDIMER" in the last 72 hours. Hgb A1c No results for input(s): "HGBA1C" in the last 72 hours. Lipid Profile No results for input(s): "CHOL", "HDL", "LDLCALC", "TRIG", "CHOLHDL", "LDLDIRECT" in the last 72 hours. Thyroid function studies No results for input(s): "TSH", "T4TOTAL", "T3FREE", "THYROIDAB" in the last 72 hours.  Invalid input(s): "FREET3" Anemia work up No results for input(s): "VITAMINB12", "FOLATE", "FERRITIN", "TIBC", "IRON", "RETICCTPCT" in the last 72 hours. Urinalysis    Component Value Date/Time   COLORURINE YELLOW 06/14/2022 1537   APPEARANCEUR TURBID (A) 06/14/2022 1537   LABSPEC 1.019 06/14/2022 1537   PHURINE 5.0 06/14/2022 1537   GLUCOSEU NEGATIVE 06/14/2022 1537   HGBUR SMALL (A) 06/14/2022 1537   BILIRUBINUR NEGATIVE 06/14/2022 1537   BILIRUBINUR negative 07/31/2012 1658   KETONESUR 5 (A) 06/14/2022 1537   PROTEINUR 100 (A) 06/14/2022 1537   UROBILINOGEN 0.2 06/04/2014 1620   NITRITE NEGATIVE 06/14/2022 1537   LEUKOCYTESUR MODERATE (A) 06/14/2022 1537   Sepsis Labs Recent Labs  Lab 06/17/22 0545 06/18/22 0304 06/19/22 0428 06/20/22 0106  WBC 10.9* 11.8* 12.1* 13.2*   Microbiology Recent Results (from the past 240 hour(s))  Urine Culture     Status: Abnormal   Collection Time: 06/14/22  3:37 PM   Specimen: Urine, Clean Catch  Result Value Ref Range Status   Specimen Description   Final    URINE, CLEAN CATCH Performed at [facility], [place] [address] [place], [place] [address]    Special Requests   Final    NONE Performed at [facility], [place] [address], [place], [place] [address]    Culture (A)  Final    1,000 COLONIES/mL DIPHTHEROIDS(CORYNEBACTERIUM SPECIES) >=100,000 COLONIES/mL VIRIDANS STREPTOCOCCUS Standardized susceptibility testing for this organism is not available. Performed at [facility], [place] [address], [place], [place] [address]    Report Status 06/20/2022 FINAL  Final      Time coordinating discharge: Over 30 minutes  SIGNED:   Little Ishikawa, DO [facility] 06/22/2022, 12:14 PM Pager   If 7PM-7AM, please contact night-coverage [email or web address]
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
## 2022-06-22 NOTE — Progress Notes (Signed)
Attempted to call report to [place], no answer at this time.

## 2022-06-22 NOTE — TOC Transition Note (Signed)
Transition of Care [facility]) - CM/SW Discharge Note   Patient Details  Name: [name] MRN: [id number] Date of Birth: 09/29/1955  Transition of Care [facility]) CM/SW Contact:  Jinger Neighbors, LCSW Phone Number: 06/22/2022, 1:52 PM   Clinical Narrative:     Pt discharging back to [facility] via [facility].  Call to Report: [phone] Room #: 106  Final next level of care: Skilled Nursing Facility Barriers to Discharge: No Barriers Identified   Patient Goals and CMS Choice CMS Medicare.gov Compare Post Acute Care list provided to:: Patient Choice offered to / list presented to : Patient  Discharge Placement                Patient [facility] bed at: [facility] Patient to be transferred to facility by: [facility] Name of family member notified: Landis Martins vm Patient and family notified of of transfer: 06/22/22  Discharge Plan and Services Additional resources added to the After Visit Summary for   In-house Referral: NA Discharge Planning Services: CM Consult Post Acute Care Choice: Nursing Home, [facility]          DME Arranged: N/A DME Agency: NA                  Social Determinants of Health ([place]) Interventions SDOH Screenings   Food Insecurity: No Food Insecurity (06/15/2022)  Housing: Low Risk  (06/15/2022)  Transportation Needs: No Transportation Needs (06/15/2022)  Utilities: Not At Risk (06/15/2022)  Tobacco Use: Medium Risk (06/20/2022)     Readmission Risk Interventions    06/15/2022   11:08 AM 02/14/2022   12:07 PM  Readmission Risk Prevention Plan  Transportation Screening Complete Complete  PCP or Specialist Appt within 5-7 Days Complete   PCP or Specialist Appt within 3-5 Days  Complete  Home Care Screening Complete   Medication Review (RN CM) Complete   HRI or [place]  Complete  Social Work Consult for [place] Planning/Counseling  Complete  Palliative Care Screening  Complete  Medication Review Human resources officer)  Complete

## 2022-06-26 ENCOUNTER — Telehealth: Payer: Self-pay | Admitting: Podiatry

## 2022-06-26 NOTE — Telephone Encounter (Signed)
Pts sister - [name] called to cancel pts appt and wanted to let Dr Elisha Ponder know that he had his other foot amputated. She wanted you to be aware and that's why she is cancelling his appointment.

## 2022-07-02 ENCOUNTER — Other Ambulatory Visit (HOSPITAL_COMMUNITY): Payer: Medicaid Other

## 2022-07-02 ENCOUNTER — Telehealth: Payer: Self-pay

## 2022-07-02 NOTE — Telephone Encounter (Signed)
Due to some confusion about the necessity of appt rescheduling, this RN was asked to call the sister to explain.  Called pt's sister, [name], two identifiers used. Explained to pt that the pt's appt needed to be moved from the 9th to the 29th for the proper timeline for wound check. She expressed concern because the pt has fallen 3 times. Reassured her that if the wound care RN or provider there felt that there was worsening, they would call this office and request an earlier appt. Instructed her to switch back to pants from shorts to help pt stop unwrapping stump. Confirmed understanding.

## 2022-07-02 NOTE — Progress Notes (Deleted)
  POST OPERATIVE OFFICE NOTE    CC:  F/u for surgery  HPI:  This is a 67 y.o. 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 Scot Dock.    Pt returns today for follow up.  Pt states ***   Allergies  Allergen Reactions   Isovue [Iopamidol] Hives    Pt broke out in one hive on his chest after contrast on 01/03/15.  Pt will need full premeds in the future per Dr Martinique.      Current Outpatient Medications  Medication Sig Dispense Refill   acetaminophen (TYLENOL) 500 MG tablet Take 1,000 mg by mouth daily.     Amino Acids-Protein Hydrolys (FEEDING SUPPLEMENT, PRO-STAT 64,) LIQD Take 30 mLs by mouth 2 (two) times daily.     aspirin EC 81 MG tablet Take 1 tablet (81 mg total) by mouth daily. 150 tablet 11   atenolol (TENORMIN) 50 MG tablet Take 1 tablet (50 mg total) by mouth daily. (Patient taking differently: Take 25 mg by mouth daily.) 30 tablet 2   busPIRone (BUSPAR) 10 MG tablet Take 10 mg by mouth 3 (three) times daily.     cholecalciferol (GNP VITAMIN D3) 10 MCG (400 UNIT) TABS tablet Take 800 Units by mouth daily.     clopidogrel (PLAVIX) 75 MG tablet Take 1 tablet (75 mg total) by mouth daily. 30 tablet 11   Multiple Vitamins-Minerals (MULTIVITAMIN WITH MINERALS) tablet Take 1 tablet by mouth daily. With Iron     phenytoin (DILANTIN) 100 MG ER capsule Take 1-2 capsules (100-200 mg total) by mouth See admin instructions. Take 100 mg daily in the morning; Take 200 mg at bedtime 90 capsule 0   pravastatin (PRAVACHOL) 40 MG tablet Take 80 mg by mouth at bedtime.     No current facility-administered medications for this visit.     ROS:  See HPI  Physical Exam:  ***  Incision:  *** Extremities:  *** Neuro: *** Abdomen:  ***    Assessment/Plan:  This is a 67 y.o. male who is s/p: ***  -***   [name], [facility] MD:  Carlis Abbott

## 2022-07-03 ENCOUNTER — Ambulatory Visit: Payer: Medicare Other

## 2022-07-14 ENCOUNTER — Emergency Department (HOSPITAL_COMMUNITY): Payer: Medicare Other

## 2022-07-14 ENCOUNTER — Encounter (HOSPITAL_COMMUNITY): Payer: Self-pay | Admitting: Emergency Medicine

## 2022-07-14 ENCOUNTER — Inpatient Hospital Stay (HOSPITAL_COMMUNITY)
Admission: EM | Admit: 2022-07-14 | Discharge: 2022-07-19 | DRG: 917 | Disposition: A | Discharge status: Skilled Nursing Facility | Payer: Medicare Other | Source: Skilled Nursing Facility | Attending: Internal Medicine | Admitting: Internal Medicine

## 2022-07-14 DIAGNOSIS — F05 Delirium due to known physiological condition: Secondary | ICD-10-CM | POA: Diagnosis not present

## 2022-07-14 DIAGNOSIS — J019 Acute sinusitis, unspecified: Secondary | ICD-10-CM | POA: Diagnosis not present

## 2022-07-14 DIAGNOSIS — I739 Peripheral vascular disease, unspecified: Secondary | ICD-10-CM | POA: Diagnosis not present

## 2022-07-14 DIAGNOSIS — I714 Abdominal aortic aneurysm, without rupture, unspecified: Secondary | ICD-10-CM | POA: Diagnosis present

## 2022-07-14 DIAGNOSIS — F39 Unspecified mood [affective] disorder: Secondary | ICD-10-CM | POA: Diagnosis present

## 2022-07-14 DIAGNOSIS — G928 Other toxic encephalopathy: Secondary | ICD-10-CM | POA: Diagnosis not present

## 2022-07-14 DIAGNOSIS — Z1152 Encounter for screening for COVID-19: Secondary | ICD-10-CM

## 2022-07-14 DIAGNOSIS — T420X5A Adverse effect of hydantoin derivatives, initial encounter: Secondary | ICD-10-CM | POA: Diagnosis present

## 2022-07-14 DIAGNOSIS — Z87820 Personal history of traumatic brain injury: Secondary | ICD-10-CM | POA: Diagnosis not present

## 2022-07-14 DIAGNOSIS — G8929 Other chronic pain: Secondary | ICD-10-CM | POA: Insufficient documentation

## 2022-07-14 DIAGNOSIS — G929 Unspecified toxic encephalopathy: Secondary | ICD-10-CM | POA: Diagnosis present

## 2022-07-14 DIAGNOSIS — E43 Unspecified severe protein-calorie malnutrition: Secondary | ICD-10-CM | POA: Diagnosis present

## 2022-07-14 DIAGNOSIS — Z803 Family history of malignant neoplasm of breast: Secondary | ICD-10-CM

## 2022-07-14 DIAGNOSIS — R4182 Altered mental status, unspecified: Secondary | ICD-10-CM | POA: Diagnosis present

## 2022-07-14 DIAGNOSIS — G629 Polyneuropathy, unspecified: Secondary | ICD-10-CM | POA: Diagnosis not present

## 2022-07-14 DIAGNOSIS — Z89612 Acquired absence of left leg above knee: Secondary | ICD-10-CM | POA: Diagnosis not present

## 2022-07-14 DIAGNOSIS — Z800 Family history of malignant neoplasm of digestive organs: Secondary | ICD-10-CM

## 2022-07-14 DIAGNOSIS — E785 Hyperlipidemia, unspecified: Secondary | ICD-10-CM | POA: Diagnosis not present

## 2022-07-14 DIAGNOSIS — Z7982 Long term (current) use of aspirin: Secondary | ICD-10-CM

## 2022-07-14 DIAGNOSIS — T420X4A Poisoning by hydantoin derivatives, undetermined, initial encounter: Principal | ICD-10-CM

## 2022-07-14 DIAGNOSIS — Z8249 Family history of ischemic heart disease and other diseases of the circulatory system: Secondary | ICD-10-CM | POA: Diagnosis not present

## 2022-07-14 DIAGNOSIS — Z89611 Acquired absence of right leg above knee: Secondary | ICD-10-CM

## 2022-07-14 DIAGNOSIS — Z79899 Other long term (current) drug therapy: Secondary | ICD-10-CM

## 2022-07-14 DIAGNOSIS — Z7902 Long term (current) use of antithrombotics/antiplatelets: Secondary | ICD-10-CM

## 2022-07-14 DIAGNOSIS — Z91041 Radiographic dye allergy status: Secondary | ICD-10-CM

## 2022-07-14 DIAGNOSIS — T420X1A Poisoning by hydantoin derivatives, accidental (unintentional), initial encounter: Secondary | ICD-10-CM | POA: Diagnosis not present

## 2022-07-14 DIAGNOSIS — G40909 Epilepsy, unspecified, not intractable, without status epilepticus: Secondary | ICD-10-CM | POA: Diagnosis not present

## 2022-07-14 DIAGNOSIS — I10 Essential (primary) hypertension: Secondary | ICD-10-CM | POA: Diagnosis present

## 2022-07-14 DIAGNOSIS — Z681 Body mass index (BMI) 19 or less, adult: Secondary | ICD-10-CM

## 2022-07-14 LAB — URINALYSIS, ROUTINE W REFLEX MICROSCOPIC
Bacteria, UA: NONE SEEN
Bilirubin Urine: NEGATIVE
Glucose, UA: NEGATIVE mg/dL
Hgb urine dipstick: NEGATIVE
Ketones, ur: NEGATIVE mg/dL
Leukocytes,Ua: NEGATIVE
Nitrite: NEGATIVE
Protein, ur: NEGATIVE mg/dL
Specific Gravity, Urine: 1.016 (ref 1.005–1.030)
pH: 6 (ref 5.0–8.0)

## 2022-07-14 LAB — CBC WITH DIFFERENTIAL/PLATELET
Abs Immature Granulocytes: 0.06 10*3/uL (ref 0.00–0.07)
Basophils Absolute: 0 10*3/uL (ref 0.0–0.1)
Basophils Relative: 0 %
Eosinophils Absolute: 0.3 10*3/uL (ref 0.0–0.5)
Eosinophils Relative: 3 %
HCT: 35.3 % — ABNORMAL LOW (ref 39.0–52.0)
Hemoglobin: 10.6 g/dL — ABNORMAL LOW (ref 13.0–17.0)
Immature Granulocytes: 1 %
Lymphocytes Relative: 42 %
Lymphs Abs: 3.9 10*3/uL (ref 0.7–4.0)
MCH: 28 pg (ref 26.0–34.0)
MCHC: 30 g/dL (ref 30.0–36.0)
MCV: 93.4 fL (ref 80.0–100.0)
Monocytes Absolute: 0.8 10*3/uL (ref 0.1–1.0)
Monocytes Relative: 9 %
Neutro Abs: 4.1 10*3/uL (ref 1.7–7.7)
Neutrophils Relative %: 45 %
Platelets: 404 10*3/uL — ABNORMAL HIGH (ref 150–400)
RBC: 3.78 MIL/uL — ABNORMAL LOW (ref 4.22–5.81)
RDW: 14.4 % (ref 11.5–15.5)
WBC: 9.1 10*3/uL (ref 4.0–10.5)
nRBC: 0 % (ref 0.0–0.2)

## 2022-07-14 LAB — COMPREHENSIVE METABOLIC PANEL
ALT: 19 U/L (ref 0–44)
AST: 26 U/L (ref 15–41)
Albumin: 2.8 g/dL — ABNORMAL LOW (ref 3.5–5.0)
Alkaline Phosphatase: 96 U/L (ref 38–126)
Anion gap: 8 (ref 5–15)
BUN: 20 mg/dL (ref 8–23)
CO2: 28 mmol/L (ref 22–32)
Calcium: 8.6 mg/dL — ABNORMAL LOW (ref 8.9–10.3)
Chloride: 97 mmol/L — ABNORMAL LOW (ref 98–111)
Creatinine, Ser: 0.7 mg/dL (ref 0.61–1.24)
GFR, Estimated: >60 mL/min (ref >60)
Glucose, Bld: 113 mg/dL — ABNORMAL HIGH (ref 70–99)
Potassium: 3.9 mmol/L (ref 3.5–5.1)
Sodium: 133 mmol/L — ABNORMAL LOW (ref 135–145)
Total Bilirubin: 0.2 mg/dL — ABNORMAL LOW (ref 0.3–1.2)
Total Protein: 7.3 g/dL (ref 6.5–8.1)

## 2022-07-14 LAB — RESP PANEL BY RT-PCR (RSV, FLU A&B, COVID)  RVPGX2
Influenza A by PCR: NEGATIVE
Influenza B by PCR: NEGATIVE
Resp Syncytial Virus by PCR: NEGATIVE
SARS Coronavirus 2 by RT PCR: NEGATIVE

## 2022-07-14 LAB — AMMONIA: Ammonia: 27 umol/L (ref 9–35)

## 2022-07-14 LAB — PHENYTOIN LEVEL, TOTAL
Phenytoin Lvl: 36.3 ug/mL (ref 10.0–20.0)
Phenytoin Lvl: 38.6 ug/mL (ref 10.0–20.0)

## 2022-07-14 MED ORDER — SODIUM CHLORIDE 0.9 % IV SOLN: INTRAVENOUS | Status: AC

## 2022-07-14 MED ORDER — CLOPIDOGREL BISULFATE 75 MG PO TABS
75.0000 mg | ORAL_TABLET | Freq: Every day | ORAL | Status: DC
  Administered 2022-07-15 – 2022-07-19 (×5): 75 mg via ORAL
  Filled 2022-07-14 (×5): qty 1

## 2022-07-14 MED ORDER — GABAPENTIN 100 MG PO CAPS
200.0000 mg | ORAL_CAPSULE | Freq: Every day | ORAL | Status: DC
  Administered 2022-07-15 – 2022-07-18 (×5): 200 mg via ORAL
  Filled 2022-07-14 (×5): qty 2

## 2022-07-14 MED ORDER — BUSPIRONE HCL 10 MG PO TABS
10.0000 mg | ORAL_TABLET | Freq: Three times a day (TID) | ORAL | Status: DC
  Administered 2022-07-15 – 2022-07-19 (×14): 10 mg via ORAL
  Filled 2022-07-14 (×14): qty 1

## 2022-07-14 MED ORDER — AMOXICILLIN-POT CLAVULANATE 875-125 MG PO TABS
1.0000 | ORAL_TABLET | Freq: Two times a day (BID) | ORAL | Status: AC
  Administered 2022-07-15 – 2022-07-19 (×10): 1 via ORAL
  Filled 2022-07-14 (×10): qty 1

## 2022-07-14 MED ORDER — ACETAMINOPHEN 325 MG PO TABS
650.0000 mg | ORAL_TABLET | Freq: Four times a day (QID) | ORAL | Status: DC | PRN
  Administered 2022-07-16 – 2022-07-18 (×3): 650 mg via ORAL
  Filled 2022-07-14 (×3): qty 2

## 2022-07-14 MED ORDER — ACETAMINOPHEN 650 MG RE SUPP: 650.0000 mg | Freq: Four times a day (QID) | RECTAL | Status: DC | PRN

## 2022-07-14 MED ORDER — ASPIRIN 81 MG PO TBEC
81.0000 mg | DELAYED_RELEASE_TABLET | Freq: Every day | ORAL | Status: DC
  Administered 2022-07-15 – 2022-07-19 (×5): 81 mg via ORAL
  Filled 2022-07-14 (×5): qty 1

## 2022-07-14 MED ORDER — SODIUM CHLORIDE 0.9 % IV BOLUS
1000.0000 mL | Freq: Once | INTRAVENOUS | Status: AC
  Administered 2022-07-14: 1000 mL via INTRAVENOUS

## 2022-07-14 MED ORDER — DOCUSATE SODIUM 100 MG PO CAPS
100.0000 mg | ORAL_CAPSULE | Freq: Every day | ORAL | Status: DC
  Administered 2022-07-15 – 2022-07-17 (×3): 100 mg via ORAL
  Filled 2022-07-14 (×3): qty 1

## 2022-07-14 MED ORDER — ENOXAPARIN SODIUM 40 MG/0.4ML IJ SOSY
40.0000 mg | PREFILLED_SYRINGE | INTRAMUSCULAR | Status: DC
  Administered 2022-07-15 – 2022-07-16 (×2): 40 mg via SUBCUTANEOUS
  Filled 2022-07-14 (×2): qty 0.4

## 2022-07-14 MED ORDER — ATENOLOL 50 MG PO TABS
50.0000 mg | ORAL_TABLET | Freq: Every day | ORAL | Status: DC
  Administered 2022-07-15 – 2022-07-19 (×5): 50 mg via ORAL
  Filled 2022-07-14: qty 2
  Filled 2022-07-14 (×4): qty 1

## 2022-07-14 MED ORDER — PRAVASTATIN SODIUM 40 MG PO TABS
40.0000 mg | ORAL_TABLET | Freq: Every day | ORAL | Status: DC
  Administered 2022-07-15 – 2022-07-18 (×5): 40 mg via ORAL
  Filled 2022-07-14 (×5): qty 1
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
## 2022-07-14 NOTE — ED Notes (Signed)
Pt removed IV.

## 2022-07-14 NOTE — ED Provider Notes (Signed)
[place] Provider Note   CSN: [id number] Arrival date & time: 07/14/22  1330     History  Chief Complaint  Patient presents with   Altered Mental Status    [name] is a 67 y.o. male presenting to the emergency department with altered mental status.  Patient has a history of bilateral AKA's.  He is here from a living facility with his sister at the bedside, concerned about patient having increasing lethargy and confusion and weakness.  Patient reportedly had elevated Dilantin levels at 40 when drawn earlier this week.  They have been holding his Dilantin since then.  He has a history of a TBI. The patient was also discharged from the hospital 3 weeks ago after medical admission for Altered mental status which was thought to be due to urinary tract infection, and subsequently also diagnosed with osteomyelitis on left lower extremity, and underwent left AKA 12/26.   HPI     Home Medications Prior to Admission medications   Medication Sig Start Date End Date Taking? Authorizing Provider  acetaminophen (TYLENOL) 500 MG tablet Take 1,000 mg by mouth daily.    [provider]  Amino Acids-Protein Hydrolys (FEEDING SUPPLEMENT, PRO-STAT 64,) LIQD Take 30 mLs by mouth 2 (two) times daily.    [provider]  aspirin EC 81 MG tablet Take 1 tablet (81 mg total) by mouth daily. 08/17/21   Ulyses Amor, PA-C  atenolol (TENORMIN) 50 MG tablet Take 1 tablet (50 mg total) by mouth daily. Patient taking differently: Take 25 mg by mouth daily. 06/24/21   Oswald Hillock, MD  busPIRone (BUSPAR) 10 MG tablet Take 10 mg by mouth 3 (three) times daily.    [provider]  cholecalciferol (GNP VITAMIN D3) 10 MCG (400 UNIT) TABS tablet Take 800 Units by mouth daily.    [provider]  clopidogrel (PLAVIX) 75 MG tablet Take 1 tablet (75 mg total) by mouth daily. 08/17/21   Ulyses Amor, PA-C  Multiple  Vitamins-Minerals (MULTIVITAMIN WITH MINERALS) tablet Take 1 tablet by mouth daily. With Iron    [provider]  phenytoin (DILANTIN) 100 MG ER capsule Take 1-2 capsules (100-200 mg total) by mouth See admin instructions. Take 100 mg daily in the morning; Take 200 mg at bedtime 06/22/22   Little Ishikawa, MD  pravastatin (PRAVACHOL) 40 MG tablet Take 80 mg by mouth at bedtime.    [provider]      Allergies    Isovue [iopamidol]    Review of Systems   Review of Systems  Physical Exam Updated Vital Signs BP (!) 148/67 (BP Location: Right Arm)   Pulse 69   Temp 97.6 F (36.4 C) (Oral)   Ht '5\' 8"'$  (1.727 m)   Wt 40 kg   BMI 13.41 kg/m  Physical Exam Constitutional:      General: He is not in acute distress. HENT:     Head: Normocephalic and atraumatic.  Eyes:     Conjunctiva/sclera: Conjunctivae normal.     Pupils: Pupils are equal, round, and reactive to light.  Cardiovascular:     Rate and Rhythm: Normal rate and regular rhythm.  Pulmonary:     Effort: Pulmonary effort is normal. No respiratory distress.  Abdominal:     General: There is no distension.     Tenderness: There is no abdominal tenderness.  Skin:    General: Skin is warm and dry.  Comments: Bilateral AKA  Neurological:     General: No focal deficit present.     Mental Status: He is alert. Mental status is at baseline.  Psychiatric:        Mood and Affect: Mood normal.        Behavior: Behavior normal.     ED Results / Procedures / Treatments   Labs (all labs ordered are listed, but only abnormal results are displayed) Labs Reviewed  RESP PANEL BY RT-PCR (RSV, FLU A&B, COVID)  [id number]  PHENYTOIN LEVEL, TOTAL  COMPREHENSIVE METABOLIC PANEL  CBC WITH DIFFERENTIAL/PLATELET  URINALYSIS, ROUTINE W REFLEX MICROSCOPIC  AMMONIA    EKG None  Radiology No results found.  Procedures Procedures    Medications Ordered in ED Medications - No data to display  ED Course/  Medical Decision Making/ A&P                             Medical Decision Making Amount and/or Complexity of Data Reviewed Labs: ordered. Radiology: ordered.   This patient presents to the ED with concern for altered mental status. This involves an extensive number of treatment options, and is a complaint that carries with it a high risk of complications and morbidity.  The differential diagnosis includes supratherapeutic medication levels versus UTI versus metabolic encephalopathy versus other  Co-morbidities that complicate the patient evaluation: History of TBI and seizures at high risk of altered mental status episodes  Additional history obtained from patient's sister at bedside  External records from outside source obtained and reviewed including hospital discharge summary from Dec 2023  I ordered and personally interpreted labs.  The pertinent results include:  pending at the time of signout  I ordered imaging studies including CTH, DG chest, pending at signout   Test Considered: Lower suspicion for meningitis, and I do not see indication for emergent lumbar puncture at this time  Dispostion:  Signed out to EDP at 3 pm pending f/u on labs, imaging, reassessment.         Final Clinical Impression(s) / ED Diagnoses Final diagnoses:  None    Rx / DC Orders ED Discharge Orders     None         Jerome Otter, Carola Rhine, MD 07/14/22 [phone]

## 2022-07-14 NOTE — ED Notes (Signed)
Patient transported to CT 

## 2022-07-14 NOTE — ED Triage Notes (Signed)
Pt BIBGEMS from facility with confusion but did not want to go to hospital today.  Denies pain   Dilantin level elevated at 40 drawn on 07/11/2022  Hx TBI with forgetfulness, confused about family members  BP 142/68 HR 68 regular CBG 155 RR 16 99% room air

## 2022-07-14 NOTE — ED Provider Notes (Signed)
  Physical Exam  BP (!) 133/59   Pulse 71   Temp 98.3 F (36.8 C)   Resp 17   Ht '5\' 8"'$  (1.727 m)   Wt 40 kg   SpO2 100%   BMI 13.41 kg/m   Physical Exam  Procedures  Procedures  ED Course / MDM    Medical Decision Making Care assumed at 3 PM.  Patient is here with altered mental status.  Patient had supratherapeutic Dilantin level of 40 on the 17th.  Dilantin has been discontinued since then.  Patient has subcu fluids for the last 2 days and the level still is around 37.  Patient is persistently altered and was sent here for further evaluation.  Patient has no fevers.  Patient has recent BKA.  6:30 pm I examined the patient and looked at his BKA site and it does not look infected.  Patient CBC and CMP unremarkable.  Initial Dilantin level is 36.  I discussed with Dr. Quinn Axe who recommend repeating a level in case it was erroneous.  8:11 PM Repeat Dilantin is 38.  I talked to Dr. Regis Bill from neurology.  He recommend IV fluids and repeat Dilantin level in 24 hours.  He will see the patient as a consult.  Hospitalist to admit  CRITICAL CARE Performed by: Wandra Arthurs   Total critical care time: 30 minutes  Critical care time was exclusive of separately billable procedures and treating other patients.  Critical care was necessary to treat or prevent imminent or life-threatening deterioration.  Critical care was time spent personally by me on the following activities: development of treatment plan with patient and/or surrogate as well as nursing, discussions with consultants, evaluation of patient's response to treatment, examination of patient, obtaining history from patient or surrogate, ordering and performing treatments and interventions, ordering and review of laboratory studies, ordering and review of radiographic studies, pulse oximetry and re-evaluation of patient's condition.   Problems Addressed: Phenytoin poisoning of undetermined intent, initial encounter: acute  illness or injury  Amount and/or Complexity of Data Reviewed Labs: ordered. Decision-making details documented in ED Course. Radiology: ordered and independent interpretation performed. Decision-making details documented in ED Course.          Drenda Freeze, MD 07/14/22 2114

## 2022-07-15 ENCOUNTER — Other Ambulatory Visit: Payer: Self-pay

## 2022-07-15 DIAGNOSIS — Z7982 Long term (current) use of aspirin: Secondary | ICD-10-CM | POA: Diagnosis not present

## 2022-07-15 DIAGNOSIS — Z89611 Acquired absence of right leg above knee: Secondary | ICD-10-CM | POA: Diagnosis not present

## 2022-07-15 DIAGNOSIS — Z681 Body mass index (BMI) 19 or less, adult: Secondary | ICD-10-CM | POA: Diagnosis not present

## 2022-07-15 DIAGNOSIS — T420X5A Adverse effect of hydantoin derivatives, initial encounter: Secondary | ICD-10-CM | POA: Diagnosis present

## 2022-07-15 DIAGNOSIS — G629 Polyneuropathy, unspecified: Secondary | ICD-10-CM | POA: Diagnosis present

## 2022-07-15 DIAGNOSIS — T420X1A Poisoning by hydantoin derivatives, accidental (unintentional), initial encounter: Secondary | ICD-10-CM | POA: Diagnosis present

## 2022-07-15 DIAGNOSIS — G40909 Epilepsy, unspecified, not intractable, without status epilepticus: Secondary | ICD-10-CM

## 2022-07-15 DIAGNOSIS — E43 Unspecified severe protein-calorie malnutrition: Secondary | ICD-10-CM | POA: Diagnosis present

## 2022-07-15 DIAGNOSIS — Z1152 Encounter for screening for COVID-19: Secondary | ICD-10-CM | POA: Diagnosis not present

## 2022-07-15 DIAGNOSIS — E785 Hyperlipidemia, unspecified: Secondary | ICD-10-CM | POA: Diagnosis present

## 2022-07-15 DIAGNOSIS — F05 Delirium due to known physiological condition: Secondary | ICD-10-CM | POA: Diagnosis not present

## 2022-07-15 DIAGNOSIS — I714 Abdominal aortic aneurysm, without rupture, unspecified: Secondary | ICD-10-CM | POA: Diagnosis present

## 2022-07-15 DIAGNOSIS — F39 Unspecified mood [affective] disorder: Secondary | ICD-10-CM | POA: Diagnosis present

## 2022-07-15 DIAGNOSIS — I10 Essential (primary) hypertension: Secondary | ICD-10-CM | POA: Diagnosis present

## 2022-07-15 DIAGNOSIS — Z800 Family history of malignant neoplasm of digestive organs: Secondary | ICD-10-CM | POA: Diagnosis not present

## 2022-07-15 DIAGNOSIS — Z87820 Personal history of traumatic brain injury: Secondary | ICD-10-CM | POA: Diagnosis not present

## 2022-07-15 DIAGNOSIS — Z7902 Long term (current) use of antithrombotics/antiplatelets: Secondary | ICD-10-CM | POA: Diagnosis not present

## 2022-07-15 DIAGNOSIS — Z803 Family history of malignant neoplasm of breast: Secondary | ICD-10-CM | POA: Diagnosis not present

## 2022-07-15 DIAGNOSIS — Z8249 Family history of ischemic heart disease and other diseases of the circulatory system: Secondary | ICD-10-CM | POA: Diagnosis not present

## 2022-07-15 DIAGNOSIS — I739 Peripheral vascular disease, unspecified: Secondary | ICD-10-CM | POA: Diagnosis not present

## 2022-07-15 DIAGNOSIS — G8929 Other chronic pain: Secondary | ICD-10-CM | POA: Diagnosis present

## 2022-07-15 DIAGNOSIS — J019 Acute sinusitis, unspecified: Secondary | ICD-10-CM | POA: Diagnosis present

## 2022-07-15 DIAGNOSIS — Z89612 Acquired absence of left leg above knee: Secondary | ICD-10-CM | POA: Diagnosis not present

## 2022-07-15 DIAGNOSIS — R4182 Altered mental status, unspecified: Secondary | ICD-10-CM | POA: Diagnosis present

## 2022-07-15 DIAGNOSIS — G928 Other toxic encephalopathy: Secondary | ICD-10-CM | POA: Diagnosis present

## 2022-07-15 LAB — URINE CULTURE: Culture: NO GROWTH

## 2022-07-15 LAB — PHENYTOIN LEVEL, TOTAL: Phenytoin Lvl: 31.4 ug/mL (ref 10.0–20.0)

## 2022-07-15 LAB — FOLATE: Folate: 16.8 ng/mL (ref >5.9)

## 2022-07-15 MED ORDER — ORAL CARE MOUTH RINSE: 15.0000 mL | OROMUCOSAL | Status: DC | PRN

## 2022-07-15 NOTE — Progress Notes (Addendum)
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  Patient presented to the ED on 07/14/2022 from SNF for evaluation of confusion (baseline short term memory deficits without confusion) in approximately 1 week of decreased appetite, increased drowsiness, and an episode of significant confusion/paranoia with agitation on 1/20 prior to hospital transport.  Reportedly, Dilantin level was elevated at 41 drawn at his facility on 1/17 and his Dilantin medication has been held since. Dilantin level on ED arrival was elevated at 36.3 despite reports of holding Dilantin since 07/11/22.   Reportedly, patient's Dilantin levels had been low at his facility.  In December 2023 for AKA, patient's Dilantin level was found to be elevated and his home dose of Dilantin was decreased at discharge.  Patient was discharged 06/22/22 on phenytoin 100 mg in the morning and 200 mg at bedtime.  Due to low levels when checked at the facility, his dose has reportedly been increased to 200 mg in the morning and 300 mg in the evening.   Subjective: Contacted facility, pending fax of MAR and serum Dilantin levels for further investigation.  Patient intermittently agitated and verbally aggressive with staff on initial examination this morning but is redirectable. He does pull at lines and tubes. Reportedly did not sleep well overnight and was in the ED hallway for the majority of the night. Sister at bedside states that the patient is usually very "chill" and calm but the agitation is new. He would typically be able to feed himself as well but over the past week at his facility, he has been unable to feed himself.   Exam: Vitals:    07/15/22 0750 07/15/22 0932  BP: (!) 184/97   Pulse: 80   Resp: 18 18  Temp:  (!) 97.5 F (36.4 C)  SpO2: 98%    Gen: Sitting up in bed, intermittently pulling at cardiac monitoring in brief. Resp: non-labored breathing, no acute distress Abd: soft, non-tender  Neuro: Mental Status: Awake, alert to self, and is able to state that he is in [facility].  He does state that he is in a hospital called [facility]" then trails off.  He states that it is "20-something" when asked the year and states that it is June, July, August, September when asked the month.  He is unable to provide a clear or coherent history of present illness. Patient identifies his sister and calls her by name is soon as she walks through the door during assessment today. Patient is able to follow commands with repeat instruction with intermittent agitation. No aphasia noted. Cranial Nerves:PERRL, EOMI, VFF, face is symmetric resting and with movement, facial sensation intact and symmetric to light touch, hearing is intact to voice, head is midline.  Motor: Spontaneous, restless, antigravity movements of the right upper extremities present without unilateral weakness. Bilateral AKA.  Stumps elevate bilaterally. Sensory: Intact and symmetric to light touch throughout Gait: Deferred, patient with bilateral AKA  Pertinent Labs: CBC    Component Value Date/Time   WBC 9.1 07/14/2022 1541   RBC 3.78 (L) 07/14/2022 1541   HGB 10.6 (L) 07/14/2022 1541   HCT 35.3 (L) 07/14/2022 1541   PLT 404 (H) 07/14/2022  1541   MCV 93.4 07/14/2022 1541   MCH 28.0 07/14/2022 1541   MCHC 30.0 07/14/2022 1541   RDW 14.4 07/14/2022 1541   LYMPHSABS 3.9 07/14/2022 1541   MONOABS 0.8 07/14/2022 1541   EOSABS 0.3 07/14/2022 1541   BASOSABS 0.0 07/14/2022 1541   CMP     Component Value Date/Time   NA 133 (L) 07/14/2022 1541   NA 141 08/02/2021 1356   K 3.9 07/14/2022 1541   CL 97 (L) 07/14/2022 1541   CO2 28  07/14/2022 1541   GLUCOSE 113 (H) 07/14/2022 1541   BUN 20 07/14/2022 1541   BUN 20 08/02/2021 1356   CREATININE 0.70 07/14/2022 1541   CREATININE 0.73 12/31/2014 1330   CALCIUM 8.6 (L) 07/14/2022 1541   PROT 7.3 07/14/2022 1541   PROT 7.9 08/02/2021 1356   ALBUMIN 2.8 (L) 07/14/2022 1541   ALBUMIN 5.1 (H) 08/02/2021 1356   AST 26 07/14/2022 1541   ALT 19 07/14/2022 1541   ALKPHOS 96 07/14/2022 1541   BILITOT 0.2 (L) 07/14/2022 1541   BILITOT 0.2 08/02/2021 1356   GFRNONAA >60 07/14/2022 1541   GFRNONAA >89 10/23/2011 1504   GFRAA >60 11/16/2019 1600   GFRAA >89 10/23/2011 1504   Lab Results  Component Value Date   PHENYTOIN 38.6 (HH) 07/14/2022   Lab Results  Component Value Date   VITAMINB12 416 08/02/2021   Lab Results  Component Value Date   TSH 1.209 10/18/2011  Urinalysis    Component Value Date/Time   COLORURINE YELLOW 07/14/2022 1452   APPEARANCEUR CLEAR 07/14/2022 1452   LABSPEC 1.016 07/14/2022 1452   PHURINE 6.0 07/14/2022 1452   GLUCOSEU NEGATIVE 07/14/2022 1452   HGBUR NEGATIVE 07/14/2022 1452   BILIRUBINUR NEGATIVE 07/14/2022 1452   BILIRUBINUR negative 07/31/2012 [place] 07/14/2022 1452   PROTEINUR NEGATIVE 07/14/2022 1452   UROBILINOGEN 0.2 06/04/2014 1620   NITRITE NEGATIVE 07/14/2022 1452   LEUKOCYTESUR NEGATIVE 07/14/2022 1452   Ammonia 27  Imaging Reviewed: CTH 07/14/22: 1. No acute findings. 2. 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 Old right frontal infarct. 3. Moderate opacification of the sinuses compatible with extensive inflammatory change as there is a suggestion of air-fluid levels over the maxillary sinuses as findings could be seen with acute sinusitis.  Assessment: [name] is a 67 y.o. 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  He  presents from his facility with altered mental status in the setting of elevated dilantin levels to 40. His dilantin was held reportedly since 07/11/22 and patient remained confused so he was sent to the ED for further evaluation.   Spoke with sister, seems like he has not had a seizure since 2010 and despite that, his Dilantin dosage has been increased over the years as his levels were low. She also is unsure if the facility actually decreased his Dilantin dose as recommended at discharge from hospital in Dec 2023.  Impression:  Phenytoin toxicity with phenytoin level 40 > 38.6  Recommendations: - Obtain phenytoin level at 24 hours (ordered at 18:00 this evening) - Continue to hold phenytoin at this time, further recommendations pending PM phenytoin levels - Delirium precautions - Pending fax from [place] regarding his MAR, phenytoin levels, and dose adjustments - Encephalopathy labs: Vitamin B1, Folate levels pending    Addendum 13:00- Following discussion  with [facility] facility staff. Patient was receiving phenytoin as ordered at discharge in December 2023: 100 mg AM dose and 200 mg PM. Labs were collected 06/27/2021 and his phenytoin level was 4.3.  06/28/21 - 07/11/21 patient received 200 mg AM dose of phenytoin and 300 mg PM dose 07/11/21 phenytoin level 40 07/13/21 phenytoin level 46.4  Per facility staff, patient medications come in bubble packs. There was an order to discontinue phenytoin 07/11/21 and they are under the assumption that the phenytoin was removed from the bubble packs at this time but there is some possibility that this medication was not removed or a possible medication error occurred. Cannot 100% guarantee this medication was removed from the bubble pack as ordered.   Anibal Henderson, AGACNP-BC [facility] [phone]   Attending Neurohospitalist Addendum Patient seen and examined with APP/Resident. Agree with the history and physical as documented  above. Agree with the plan as documented, which I helped formulate. I have edited the note above to reflect my full findings and recommendations. I have independently reviewed the chart, obtained history, review of systems and examined the patient.I have personally reviewed pertinent head/neck/spine imaging (CT/MRI). Please feel free to call with any questions.  Possible medication error described in addendum above would explain why dilantin level remained supratherapeutic after the intention was to hold dilantin. This cannot be known for certain however because dilantin pharmacokinetics can be extremely complex. Repeat dilantin level pending for tonight. Continue to hold dilantin. Pharmacy consult for assistance checking levels and recalculating dose after no longer supratherapeutic.  -- Su Monks, MD [facility] [phone]  If 7pm- 7am, please page neurology on call as listed in [place].

## 2022-07-15 NOTE — ED Notes (Addendum)
Provider at bedside, speaking with pt's sister.

## 2022-07-15 NOTE — ED Notes (Signed)
NAD noted, respirations are equal bilaterally and unlabored at this time. Pt resting in gurney. Pt connected to CCM. Call light within reach.

## 2022-07-15 NOTE — ED Notes (Signed)
Pt removed all items attached to body including PIV. PT voided in brief and was cleaned and placed into new brief and gown.

## 2022-07-15 NOTE — ED Notes (Signed)
Pt getting agitated, pullling at IV and taking off cardiac monitoring.  RN re-wrapped arm w/ IV and verbally reassure pt.  Pt calm for a short while.  Pt requested urinal, RN attempted to help however it appeared we were too late.  RN and Tec took pt into a room and did a full linen change.  Pt enjoyed new warm blankets.

## 2022-07-15 NOTE — ED Notes (Signed)
Pt had bowel movement, pt cleaned and changed into clean brief, gown and bed sheets.

## 2022-07-15 NOTE — ED Notes (Addendum)
Warm blankets placed on pt. 

## 2022-07-15 NOTE — ED Notes (Signed)
This RN assumed care of patient and received off going transfer of care report from previous RN. Pt presented to the ED with new onset of confusion and general weakness from a SNF. Pt has history of TBI with baseline A&Ox4 with forgetfulness, bilateral AKA both were recently operated on- surgical incision sites are well appearing. PT is sleeping in gurney at this time, respirations are spontaneous, even, unlabored and symmetrical bilaterally. Pt skin tone is appropriate for ethnicity, mildly diaphoretic and warm. Pt connected to CCM, pulse ox and BP.  No family at bedside at this time.

## 2022-07-15 NOTE — Progress Notes (Signed)
[facility] PROGRESS NOTE   [name] JKD:[id number] DOB: 27-Feb-1956 DOA: 07/14/2022  PCP: Jeanette Caprice, PA-C  Brief History/Interval Summary: 67 y.o. 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  Admitted last month 12/21-12/29 for chronic left heel wound with osteomyelitis.  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  His altered mental status was felt to be due to infection versus polypharmacy.  His home gabapentin and mirtazapine were discontinued and dose of Dilantin was decreased due to elevated levels.   Presented due to elevated Dilantin levels at nursing facility with altered mental status.  Was hospitalized for further management.  Consultants: Neurology  Procedures: None    Subjective/Interval History: Patient opens his eyes when his name is called.  Not very communicative.  Not as agitated as he was last night according to reports.  Difficult to get him to cooperate with examination.    Assessment/Plan:  Phenytoin toxicity Patient presented with elevated levels at skilled nursing facility.  Dilantin has been held.  Neurology consulted to assist with management. IV fluids being continued.  Will need to recheck Dilantin levels on daily basis.  Continue to monitor on telemetry.  Acute encephalopathy, toxic Most likely due to Dilantin toxicity.  CT head was negative for acute findings.  Patient does have cognitive impairment at baseline due to history of traumatic brain injury. Seems to be better this morning.  Continue to monitor Ammonia level was normal.  Acute sinusitis Noted on CT scan. Started on Augmentin.  Seizure disorder Dilantin on hold  currently.  See discussion above.  Essential hypertension Continue atenolol.  Monitor blood pressures closely.  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 Staples to be removed 4 weeks from operative date which was 12/26.  So it can be removed on 1/22.  Will notify vascular surgery tomorrow.  History of mood disorder Continue BuSpar  Chronic pain/neuropathy Continue gabapentin.  DVT Prophylaxis: Lovenox Code Status: Full code Family Communication: No family at bedside Disposition Plan: Hopefully back to SNF when improved  Status is: Observation The patient will require care spanning > 2 midnights and should be moved to inpatient because: Dilantin toxicity      Medications: Scheduled:  amoxicillin-clavulanate  1 tablet Oral Q12H   aspirin EC  81 mg Oral Daily   atenolol  50 mg Oral Daily   busPIRone  10 mg Oral TID   clopidogrel  75 mg Oral Daily   docusate sodium  100 mg Oral Daily   enoxaparin (LOVENOX) injection  40 mg Subcutaneous Q24H   gabapentin  200 mg Oral QHS   pravastatin  40 mg Oral QHS   Continuous:  sodium chloride Stopped (07/15/22 0828)   [id number] **OR** acetaminophen  Antibiotics: Anti-infectives (From admission, onward)    Start     Dose/Rate Route Frequency Ordered Stop   07/14/22 2245  amoxicillin-clavulanate (AUGMENTIN) 875-125 MG per tablet 1 tablet        1 tablet Oral Every 12 hours 07/14/22 2232 07/19/22 2159       Objective:  Vital Signs  Vitals:   07/14/22 1658 07/14/22 2142 07/15/22  0519 07/15/22 0750  BP: (!) 133/59 129/77 (!) 141/67 (!) 184/97  Pulse: 71 75 70 80  Resp: '17 18 18 18  '$ Temp: 98.3 F (36.8 C) 98.6 F (37 C) 97.8 F (36.6 C)   TempSrc:  Oral    SpO2: 100% 100% 100% 98%  Weight:      Height:        Intake/Output Summary (Last 24 hours) at  07/15/2022 0840 Last data filed at 07/15/2022 1610 Gross per 24 hour  Intake 60 ml  Output --  Net 60 ml   Filed Weights   07/14/22 1336  Weight: 40 kg    General appearance: Opens his eyes when name is called.  Distracted.  Not as agitated as last night.  Not very communicative. Resp: Clear to auscultation bilaterally.  Normal effort Cardio: S1-S2 is normal regular.  No S3-S4.  No rubs murmurs or bruit GI: Abdomen is soft.  Nontender nondistended.  Bowel sounds are present normal.  No masses organomegaly Extremities: He is status post bilateral AKA.  Staples noted in both the stumps.  No drainage from incision site.  No erythema or warmth.  Lab Results:  Data Reviewed: I have personally reviewed following labs and reports of the imaging studies  CBC: Recent Labs  Lab 07/14/22 1541  WBC 9.1  NEUTROABS 4.1  HGB 10.6*  HCT 35.3*  MCV 93.4  PLT 404*    Basic Metabolic Panel: Recent Labs  Lab 07/14/22 1541  NA 133*  K 3.9  CL 97*  CO2 28  GLUCOSE 113*  BUN 20  CREATININE 0.70  CALCIUM 8.6*    GFR: Estimated Creatinine Clearance: 52.8 mL/min (by C-G formula based on SCr of 0.7 mg/dL).  Liver Function Tests: Recent Labs  Lab 07/14/22 1541  AST 26  ALT 19  ALKPHOS 96  BILITOT 0.2*  PROT 7.3  ALBUMIN 2.8*    Recent Labs  Lab 07/14/22 1541  AMMONIA 27     Recent Results (from the past 240 hour(s))  Resp panel by RT-PCR (RSV, Flu A&B, Covid) Anterior Nasal Swab     Status: None   Collection Time: 07/14/22  2:53 PM   Specimen: Anterior Nasal Swab  Result Value Ref Range Status   SARS Coronavirus 2 by RT PCR NEGATIVE NEGATIVE Final    Comment: (NOTE) SARS-CoV-2 target nucleic acids are NOT DETECTED.  The SARS-CoV-2 RNA is generally detectable in upper respiratory specimens during the acute phase of infection. The lowest concentration of SARS-CoV-2 viral copies this assay can detect is 138 copies/mL. A negative result does not preclude  SARS-Cov-2 infection and should not be used as the sole basis for treatment or other patient management decisions. A negative result may occur with  improper specimen collection/handling, submission of specimen other than nasopharyngeal swab, presence of viral mutation(s) within the areas targeted by this assay, and inadequate number of viral copies(<138 copies/mL). A negative result must be combined with clinical observations, patient history, and epidemiological information. The expected result is Negative.  Fact Sheet for Patients:  EntrepreneurPulse.com.au  Fact Sheet for Healthcare Providers:  IncredibleEmployment.be  This test is no t yet approved or cleared by the [place] FDA and  has been authorized for detection and/or diagnosis of SARS-CoV-2 by FDA under an Emergency Use Authorization (EUA). This EUA will remain  in effect (meaning this test can be used) for the duration of the COVID-19 declaration under Section 564(b)(1) of the Act, 21 U.S.C.section 360bbb-3(b)(1), unless the authorization is terminated  or revoked sooner.       Influenza A by PCR NEGATIVE NEGATIVE Final   Influenza B by PCR NEGATIVE NEGATIVE Final    Comment: (NOTE) The Xpert Xpress SARS-CoV-2/FLU/RSV plus assay is intended as an aid in the diagnosis of influenza from Nasopharyngeal swab specimens and should not be used as a sole basis for treatment. Nasal washings and aspirates are unacceptable for Xpert Xpress SARS-CoV-2/FLU/RSV testing.  Fact Sheet for Patients: EntrepreneurPulse.com.au  Fact Sheet for Healthcare Providers: IncredibleEmployment.be  This test is not yet approved or cleared by the [place] FDA and has been authorized for detection and/or diagnosis of SARS-CoV-2 by FDA under an Emergency Use Authorization (EUA). This EUA will remain in effect (meaning this test can be used) for the duration of  the COVID-19 declaration under Section 564(b)(1) of the Act, 21 U.S.C. section 360bbb-3(b)(1), unless the authorization is terminated or revoked.     Resp Syncytial Virus by PCR NEGATIVE NEGATIVE Final    Comment: (NOTE) Fact Sheet for Patients: EntrepreneurPulse.com.au  Fact Sheet for Healthcare Providers: IncredibleEmployment.be  This test is not yet approved or cleared by the [place] FDA and has been authorized for detection and/or diagnosis of SARS-CoV-2 by FDA under an Emergency Use Authorization (EUA). This EUA will remain in effect (meaning this test can be used) for the duration of the COVID-19 declaration under Section 564(b)(1) of the Act, 21 U.S.C. section 360bbb-3(b)(1), unless the authorization is terminated or revoked.  Performed at [facility], [place] [address] [place], [place] [address]       Radiology Studies: CT Head Wo Contrast  Result Date: 07/14/2022 CLINICAL DATA:  Altered mental status. EXAM: CT HEAD WITHOUT CONTRAST TECHNIQUE: Contiguous axial images were obtained from the base of the skull through the vertex without intravenous contrast. RADIATION DOSE REDUCTION: This exam was performed according to the departmental dose-optimization program which includes automated exposure control, adjustment of the mA and/or kV according to patient size and/or use of iterative reconstruction technique. COMPARISON:  06/14/2022 FINDINGS: Brain: Ventricles, cisterns and other CSF spaces are mildly prominent but unchanged and likely due to mild atrophy. There is chronic ischemic microvascular disease. Couple small old bilateral lacunar infarcts over the basal ganglia. Old right frontal infarct. No mass, mass effect, shift of midline structures or acute hemorrhage. No evidence of acute infarction. Vascular: No hyperdense vessel or unexpected calcification. Skull: Normal. Negative for fracture or focal lesion. Sinuses/Orbits: Orbits  are normal. There is moderate opacification of the sinuses compatible with extensive inflammatory change as there is a suggestion of air-fluid levels over the maxillary sinuses as findings could be seen with acute sinusitis. Other: None IMPRESSION: 1. No acute findings. 2. 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 Old right frontal infarct. 3. Moderate opacification of the sinuses compatible with extensive inflammatory change as there is a suggestion of air-fluid levels over the maxillary sinuses as findings could be seen with acute sinusitis. Electronically Signed   By: Marin Olp M.D.   On: 07/14/2022 15:43   DG Chest 2 View  Result Date: 07/14/2022 CLINICAL DATA:  Cough, congestion EXAM: CHEST - 2 VIEW COMPARISON:  11/09/2021 FINDINGS: No focal consolidation. No pleural effusion or pneumothorax. Heart and mediastinal contours are unremarkable. Thoracic aortic stent graft noted. No acute osseous abnormality. IMPRESSION: No active cardiopulmonary disease. Electronically Signed   By: Kathreen Devoid M.D.   On: 07/14/2022 15:30       LOS: 0 days   [facility]  Pager on [email or web address]  07/15/2022, 8:40 AM

## 2022-07-15 NOTE — Progress Notes (Signed)
Phenytoin Initial Consult Indication: Sz  Allergies  Allergen Reactions   Isovue [Iopamidol] Hives    Pt broke out in one hive on his chest after contrast on 01/03/15.  Pt will need full premeds in the future per Dr Martinique.      Patient Measurements: Height: '5\' 8"'$  (172.7 cm) Weight: 40 kg (88 lb 2.9 oz) IBW/kg (Calculated) : 68.4 TPN AdjBW (KG): 40 Body mass index is 13.41 kg/m.   Vital signs: Temp: 98.5 F (36.9 C) (01/21 1840) Temp Source: Oral (01/21 1840) BP: 149/86 (01/21 1840) Pulse Rate: 77 (01/21 1840)  Labs: Lab Results  Component Value Date/Time   Phenytoin Lvl 31.4 (HH) 07/15/2022 1800   Lab Results  Component Value Date   PHENYTOIN 31.4 ([place]) 07/15/2022   Estimated Creatinine Clearance: 52.8 mL/min (by C-G formula based on SCr of 0.7 mg/dL).   Medications:  Medications Prior to Admission  Medication Sig Dispense Refill Last Dose   acetaminophen (TYLENOL) 500 MG tablet Take 1,000 mg by mouth daily.   07/14/2022   Amino Acids-Protein Hydrolys (PRO-STAT) LIQD Take 30 mLs by mouth 2 (two) times daily.   07/14/2022   ascorbic acid (VITAMIN C) 500 MG tablet Take 500 mg by mouth daily.   07/13/2022   aspirin EC 81 MG tablet Take 1 tablet (81 mg total) by mouth daily. 150 tablet 11 07/14/2022   atenolol (TENORMIN) 50 MG tablet Take 1 tablet (50 mg total) by mouth daily. 30 tablet 2 07/14/2022 at 0830   busPIRone (BUSPAR) 10 MG tablet Take 10 mg by mouth 3 (three) times daily.   07/14/2022   cholecalciferol (GNP VITAMIN D3) 10 MCG (400 UNIT) TABS tablet Take 400 Units by mouth daily.   07/14/2022   clopidogrel (PLAVIX) 75 MG tablet Take 1 tablet (75 mg total) by mouth daily. 30 tablet 11 07/13/2022   docusate sodium (COLACE) 100 MG capsule Take 100 mg by mouth daily.   07/14/2022   ferrous gluconate (FERGON) 324 MG tablet Take 324 mg by mouth See admin instructions. 324 mg once daily on Monday, Thursday.   Past Week   gabapentin (NEURONTIN) 100 MG capsule Take 200 mg by mouth  at bedtime.   07/13/2022   guaifenesin (ROBITUSSIN) 100 MG/5ML syrup Take 100 mg by mouth 2 (two) times daily.   07/14/2022   Multiple Vitamins-Minerals (MULTIVITAMIN WITH MINERALS) tablet Take 1 tablet by mouth daily.   07/14/2022   oxyCODONE-acetaminophen (PERCOCET/ROXICET) 5-325 MG tablet Take 1 tablet by mouth every 4 (four) hours as needed (pain).   07/13/2022   pravastatin (PRAVACHOL) 40 MG tablet Take 40 mg by mouth at bedtime.   07/13/2022   phenytoin (DILANTIN) 100 MG ER capsule Take 1-2 capsules (100-200 mg total) by mouth See admin instructions. Take 100 mg daily in the morning; Take 200 mg at bedtime (Patient not taking: Reported on 07/14/2022) 90 capsule 0 Not Taking   Scheduled:   amoxicillin-clavulanate  1 tablet Oral Q12H   aspirin EC  81 mg Oral Daily   atenolol  50 mg Oral Daily   busPIRone  10 mg Oral TID   clopidogrel  75 mg Oral Daily   docusate sodium  100 mg Oral Daily   enoxaparin (LOVENOX) injection  40 mg Subcutaneous Q24H   gabapentin  200 mg Oral QHS   pravastatin  40 mg Oral QHS   Infusions:   sodium chloride 125 mL/hr at 07/15/22 1754    Assessment: Corrected phenytoin level (if needed): 47.58 Seizure activity: no  Significant potential drug interactions:   Goals of care:  Total phenytoin level: 10-20 mcg/ml Free phenytoin level: 1-2 mcg/ml  Plan:  Continue to hold phenytoin Daily level Restart when level is in goal range  Onnie Boer, PharmD, [place], AAHIVP, CPP Infectious Disease Pharmacist 07/15/2022 8:05 PM

## 2022-07-15 NOTE — ED Notes (Incomplete)
Patient took off
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
## 2022-07-15 NOTE — ED Notes (Signed)
New PIV placed, mitts placed on bilateral hands and PIV wrapped with coban to prevent pt from pulling PIV out again.

## 2022-07-16 DIAGNOSIS — R4182 Altered mental status, unspecified: Secondary | ICD-10-CM | POA: Diagnosis not present

## 2022-07-16 DIAGNOSIS — G40909 Epilepsy, unspecified, not intractable, without status epilepticus: Secondary | ICD-10-CM | POA: Diagnosis not present

## 2022-07-16 DIAGNOSIS — I739 Peripheral vascular disease, unspecified: Secondary | ICD-10-CM | POA: Diagnosis not present

## 2022-07-16 DIAGNOSIS — T420X1A Poisoning by hydantoin derivatives, accidental (unintentional), initial encounter: Secondary | ICD-10-CM | POA: Diagnosis not present

## 2022-07-16 LAB — BASIC METABOLIC PANEL
Anion gap: 11 (ref 5–15)
BUN: 13 mg/dL (ref 8–23)
CO2: 24 mmol/L (ref 22–32)
Calcium: 9 mg/dL (ref 8.9–10.3)
Chloride: 98 mmol/L (ref 98–111)
Creatinine, Ser: 0.69 mg/dL (ref 0.61–1.24)
GFR, Estimated: >60 mL/min (ref >60)
Glucose, Bld: 99 mg/dL (ref 70–99)
Potassium: 3.9 mmol/L (ref 3.5–5.1)
Sodium: 133 mmol/L — ABNORMAL LOW (ref 135–145)

## 2022-07-16 LAB — CBC
HCT: 34.8 % — ABNORMAL LOW (ref 39.0–52.0)
Hemoglobin: 11 g/dL — ABNORMAL LOW (ref 13.0–17.0)
MCH: 28.6 pg (ref 26.0–34.0)
MCHC: 31.6 g/dL (ref 30.0–36.0)
MCV: 90.4 fL (ref 80.0–100.0)
Platelets: 402 10*3/uL — ABNORMAL HIGH (ref 150–400)
RBC: 3.85 MIL/uL — ABNORMAL LOW (ref 4.22–5.81)
RDW: 14 % (ref 11.5–15.5)
WBC: 8.2 10*3/uL (ref 4.0–10.5)
nRBC: 0 % (ref 0.0–0.2)

## 2022-07-16 LAB — PHENYTOIN LEVEL, TOTAL: Phenytoin Lvl: 28.5 ug/mL — ABNORMAL HIGH (ref 10.0–20.0)

## 2022-07-16 LAB — MAGNESIUM: Magnesium: 1.8 mg/dL (ref 1.7–2.4)

## 2022-07-16 MED ORDER — ENSURE ENLIVE PO LIQD
237.0000 mL | Freq: Two times a day (BID) | ORAL | Status: DC
  Administered 2022-07-17 – 2022-07-19 (×4): 237 mL via ORAL

## 2022-07-16 MED ORDER — ENOXAPARIN SODIUM 30 MG/0.3ML IJ SOSY
30.0000 mg | PREFILLED_SYRINGE | INTRAMUSCULAR | Status: DC
  Administered 2022-07-17 – 2022-07-19 (×3): 30 mg via SUBCUTANEOUS
  Filled 2022-07-16 (×3): qty 0.3

## 2022-07-16 MED ORDER — HYDRALAZINE HCL 20 MG/ML IJ SOLN: 5.0000 mg | Freq: Four times a day (QID) | INTRAMUSCULAR | Status: DC | PRN

## 2022-07-16 MED ORDER — ADULT MULTIVITAMIN W/MINERALS CH
1.0000 | ORAL_TABLET | Freq: Every day | ORAL | Status: DC
  Administered 2022-07-17 – 2022-07-19 (×3): 1 via ORAL
  Filled 2022-07-16 (×3): qty 1

## 2022-07-16 NOTE — Progress Notes (Signed)
Pt confused and concompliant. Keeps pulling off mittens, condom catheter, telemetry and Ivs. Not redirectable. On call provider made aware. Safety observation ordered.

## 2022-07-16 NOTE — Progress Notes (Signed)
[facility]  HPI: This is a 67 y/o male with B AKA.  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   S/P Left AKA for left heel gangrenous non healing wound and revision of non healing right AKA on 06/19/22.       Subjective  - No complaints B AKA   Objective (!) 176/79 77 (!) 97.2 F (36.2 C) (Oral) 20 100%  Intake/Output Summary (Last 24 hours) at 07/16/2022 0802 Last data filed at 07/16/2022 0700 Gross per 24 hour  Intake 1676.74 ml  Output 1000 ml  Net 676.74 ml   Right AKA revision healing well.  Staples were removed at bedside patient tolerated this well. I left the left AKA staples in place.  The Incision is well healed and both AKA stumps appear viable. Lungs non labored breathing General no acute distress    Assessment/Planning:  S/P Left AKA for left heel gangrenous non healing wound and revision of non healing right AKA on 06/19/22. I will maintain the left AKA staples.  He tolerated the right staple removal, but he did not want me to continue with the left due to pain.  I decided we can take them out in 4 days if he is still here or he already has an appointment in out office if he gets discharged today.    Viable appearing B AKA No leukocytosis  Roxy Horseman 07/16/2022 8:02 AM --  Laboratory Lab Results: Recent Labs    07/14/22 1541 07/16/22 0341  WBC 9.1 8.2  HGB 10.6* 11.0*  HCT 35.3* 34.8*  PLT 404* 402*   BMET Recent Labs    07/14/22 1541 07/16/22 0341  NA 133* 133*  K 3.9 3.9  CL 97* 98  CO2 28 24  GLUCOSE 113* 99  BUN 20 13  CREATININE 0.70 0.69  CALCIUM 8.6* 9.0    COAG Lab Results  Component Value Date   INR 1.1 11/09/2021   INR 1.32 06/11/2014   INR 1.07 06/04/2014   No results found for: "PTT"

## 2022-07-16 NOTE — Progress Notes (Signed)
Initial Nutrition Assessment  DOCUMENTATION CODES:  Severe malnutrition in context of chronic illness, Underweight  INTERVENTION:  Liberalize diet from a heart healthy to a regular diet to provide widest variety of menu options to enhance nutritional adequacy Ensure Enlive po BID, each supplement provides 350 kcal and 20 grams of protein. MVI with minerals daily  NUTRITION DIAGNOSIS:  Severe Malnutrition related to chronic illness (TBI with cognitive impairment, PAD s/p B AKA) as evidenced by severe fat depletion, severe muscle depletion.  GOAL:  Patient will meet greater than or equal to 90% of their needs  MONITOR:  PO intake, Supplement acceptance, Labs, Weight trends  REASON FOR ASSESSMENT:  Malnutrition Screening Tool    ASSESSMENT:  Pt admitted for medical management of elevated dilantin levels and AMS. 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  Pt fidgeting in bed, reports that he is unable to get comfortable. No family present at time of visit.  He does not provide detailed nutrition related history. He states that he eats "normal" but provides no additional details.   No documented meal completions on file to review.  Reviewed weight history. Pt's weight on 04/10/22 was documented to be 48.1 kg. Current weight is noted to be 40 kg. Some weight loss likely r/t B AKA. Unclear how much of his weight loss is true dry weight loss versus amputations.    Medications: colace  Labs: sodium 133  NUTRITION - FOCUSED PHYSICAL EXAM: Flowsheet Row Most Recent Value  Orbital Region Severe depletion  Upper Arm Region Severe depletion  Thoracic and Lumbar Region Severe depletion  Buccal Region Severe depletion  Temple Region Severe depletion  Clavicle Bone Region Severe depletion  Clavicle and  Acromion Bone Region Severe depletion  Scapular Bone Region Severe depletion  Dorsal Hand Severe depletion  Patellar Region Unable to assess  Anterior Thigh Region Unable to assess  Posterior Calf Region Unable to assess  Edema (RD Assessment) None  Hair Reviewed  Eyes Reviewed  Mouth Reviewed  Skin Reviewed  Nails Reviewed       Diet Order:   Diet Order             Diet Heart Room service appropriate? Yes; Fluid consistency: Thin  Diet effective now                   EDUCATION NEEDS:  Not appropriate for education at this time  Skin:  Skin Assessment: Reviewed RN Assessment  Last BM:  1/22 (type 5)  Height:  Ht Readings from Last 1 Encounters:  07/14/22 '5\' 8"'$  (1.727 m)    Weight:  Wt Readings from Last 1 Encounters:  07/14/22 40 kg   Ideal Body Weight:  58.8 kg [adj for B AKA]  BMI:  Body mass index is 13.41 kg/m. [Adj for B AKA]  Estimated Nutritional Needs:   Kcal:  1600-1800  Protein:  75-90g  Fluid:  >/=1.6L  Clayborne Dana, RDN, LDN Clinical Nutrition

## 2022-07-16 NOTE — Progress Notes (Addendum)
[facility] PROGRESS NOTE   CRIMSON BEER TIW:[id number] DOB: 10-01-1955 DOA: 07/14/2022  PCP: Jeanette Caprice, PA-C  Brief History/Interval Summary: 67 y.o. 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  Admitted last month 12/21-12/29 for chronic left heel wound with osteomyelitis.  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  His altered mental status was felt to be due to infection versus polypharmacy.  His home gabapentin and mirtazapine were discontinued and dose of Dilantin was decreased due to elevated levels.   Presented due to elevated Dilantin levels at nursing facility with altered mental status.  Was hospitalized for further management.  Consultants: Neurology  Procedures: None    Subjective/Interval History: Overnight events noted.  Patient was agitated overnight.  Seems to be calm this morning.  He knew he was in [place].  He initially got the year incorrect and said 2023 but then corrected himself to say it was 2024.  Denies any pain issues currently.      Assessment/Plan:  Phenytoin toxicity Patient presented with elevated levels at skilled nursing facility.  Dilantin has been held.  Neurology consulted to assist with management. Dilantin levels are improving.  At peak it was 38.6.  Improved to 28.5 this morning.  But when corrected for albumin of 2.8 level is 32.2.  Continue to monitor.  Mentation seems to be improving Neurology to determine dosing of phenytoin going forward.  Acute encephalopathy, toxic Most likely due to Dilantin toxicity.  CT head was negative for acute findings.  Patient does have cognitive impairment at baseline due to history  of traumatic brain injury. Seems to be experiencing some sundowning.  Calm this morning.  Continue to reorient. Ammonia level was normal.  Acute sinusitis Noted on CT scan. Started on Augmentin.  Seizure disorder Dilantin on hold currently.  See discussion above.  Essential hypertension Blood pressure is poorly controlled partly because of agitation.  Continue atenolol for now.  May need to adjust dose or add agents if blood pressure remains elevated.  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 Vascular surgery notified of this patient.  Underwent staple removal from the right AKA stump.  Due to pain issues they will defer left AKA stump staple removal to outpatient setting.  Patient has an appointment with vascular team on 1/29.  History of mood disorder Continue BuSpar  Chronic pain/neuropathy Continue gabapentin.  DVT Prophylaxis: Lovenox Code Status: Full code Family Communication: No family at bedside Disposition Plan: Hopefully back to SNF when improved  Status is: Inpatient Remains inpatient appropriate because: Phenytoin toxicity      Medications: Scheduled:  amoxicillin-clavulanate  1 tablet Oral Q12H   aspirin EC  81 mg Oral Daily   atenolol  50 mg Oral Daily   busPIRone  10 mg Oral TID   clopidogrel  75 mg Oral Daily   docusate sodium  100 mg Oral Daily   enoxaparin (LOVENOX) injection  40 mg Subcutaneous Q24H   gabapentin  200 mg Oral QHS   pravastatin  40 mg Oral QHS   Continuous:   SNK:[id number] **OR** acetaminophen, mouth rinse  Antibiotics: Anti-infectives (From  admission, onward)    Start     Dose/Rate Route Frequency Ordered Stop   07/14/22 2245  amoxicillin-clavulanate (AUGMENTIN) 875-125 MG per tablet 1 tablet        1 tablet Oral Every 12 hours 07/14/22 2232 07/19/22 2159        Objective:  Vital Signs  Vitals:   07/15/22 1840 07/15/22 2244 07/16/22 0400 07/16/22 0802  BP: (!) 149/86 (!) 189/83 (!) 176/79 (!) 154/82  Pulse: 77   82  Resp: '19 16 20 18  '$ Temp: 98.5 F (36.9 C) 97.9 F (36.6 C) (!) 97.2 F (36.2 C) 98.3 F (36.8 C)  TempSrc: Oral Oral Oral Oral  SpO2: 98% 99% 100% 100%  Weight:      Height:        Intake/Output Summary (Last 24 hours) at 07/16/2022 0849 Last data filed at 07/16/2022 0700 Gross per 24 hour  Intake 1616.74 ml  Output 1000 ml  Net 616.74 ml    Filed Weights   07/14/22 1336  Weight: 40 kg    General appearance: Awake alert.  In no distress.  Mildly distracted Resp: Clear to auscultation bilaterally.  Normal effort Cardio: S1-S2 is normal regular.  No S3-S4.  No rubs murmurs or bruit GI: Abdomen is soft.  Nontender nondistended.  Bowel sounds are present normal.  No masses organomegaly Extremities: Bilateral AKA stumps noted.  Staples noted.  Lab Results:  Data Reviewed: I have personally reviewed following labs and reports of the imaging studies  CBC: Recent Labs  Lab 07/14/22 1541 07/16/22 0341  WBC 9.1 8.2  NEUTROABS 4.1  --   HGB 10.6* 11.0*  HCT 35.3* 34.8*  MCV 93.4 90.4  PLT 404* 402*     Basic Metabolic Panel: Recent Labs  Lab 07/14/22 1541 07/16/22 0341  NA 133* 133*  K 3.9 3.9  CL 97* 98  CO2 28 24  GLUCOSE 113* 99  BUN 20 13  CREATININE 0.70 0.69  CALCIUM 8.6* 9.0  MG  --  1.8     GFR: Estimated Creatinine Clearance: 52.8 mL/min (by C-G formula based on SCr of 0.69 mg/dL).  Liver Function Tests: Recent Labs  Lab 07/14/22 1541  AST 26  ALT 19  ALKPHOS 96  BILITOT 0.2*  PROT 7.3  ALBUMIN 2.8*     Recent Labs  Lab 07/14/22 1541  AMMONIA 27      Recent Results (from the past 240 hour(s))  Resp panel by RT-PCR (RSV, Flu A&B, Covid) Anterior Nasal Swab     Status: None   Collection Time: 07/14/22  2:53 PM   Specimen: Anterior Nasal Swab  Result Value Ref  Range Status   SARS Coronavirus 2 by RT PCR NEGATIVE NEGATIVE Final    Comment: (NOTE) SARS-CoV-2 target nucleic acids are NOT DETECTED.  The SARS-CoV-2 RNA is generally detectable in upper respiratory specimens during the acute phase of infection. The lowest concentration of SARS-CoV-2 viral copies this assay can detect is 138 copies/mL. A negative result does not preclude SARS-Cov-2 infection and should not be used as the sole basis for treatment or other patient management decisions. A negative result may occur with  improper specimen collection/handling, submission of specimen other than nasopharyngeal swab, presence of viral mutation(s) within the areas targeted by this assay, and inadequate number of viral copies(<138 copies/mL). A negative result must be combined with clinical observations, patient history, and epidemiological information. The expected result is Negative.  Fact Sheet for Patients:  EntrepreneurPulse.com.au  Fact Sheet  for Healthcare Providers:  IncredibleEmployment.be  This test is no t yet approved or cleared by the [place] and  has been authorized for detection and/or diagnosis of SARS-CoV-2 by FDA under an Emergency Use Authorization (EUA). This EUA will remain  in effect (meaning this test can be used) for the duration of the COVID-19 declaration under Section 564(b)(1) of the Act, 21 U.S.C.section 360bbb-3(b)(1), unless the authorization is terminated  or revoked sooner.       Influenza A by PCR NEGATIVE NEGATIVE Final   Influenza B by PCR NEGATIVE NEGATIVE Final    Comment: (NOTE) The Xpert Xpress SARS-CoV-2/FLU/RSV plus assay is intended as an aid in the diagnosis of influenza from Nasopharyngeal swab specimens and should not be used as a sole basis for treatment. Nasal washings and aspirates are unacceptable for Xpert Xpress SARS-CoV-2/FLU/RSV testing.  Fact Sheet for  Patients: EntrepreneurPulse.com.au  Fact Sheet for Healthcare Providers: IncredibleEmployment.be  This test is not yet approved or cleared by the [place] FDA and has been authorized for detection and/or diagnosis of SARS-CoV-2 by FDA under an Emergency Use Authorization (EUA). This EUA will remain in effect (meaning this test can be used) for the duration of the COVID-19 declaration under Section 564(b)(1) of the Act, 21 U.S.C. section 360bbb-3(b)(1), unless the authorization is terminated or revoked.     Resp Syncytial Virus by PCR NEGATIVE NEGATIVE Final    Comment: (NOTE) Fact Sheet for Patients: EntrepreneurPulse.com.au  Fact Sheet for Healthcare Providers: IncredibleEmployment.be  This test is not yet approved or cleared by the [place] FDA and has been authorized for detection and/or diagnosis of SARS-CoV-2 by FDA under an Emergency Use Authorization (EUA). This EUA will remain in effect (meaning this test can be used) for the duration of the COVID-19 declaration under Section 564(b)(1) of the Act, 21 U.S.C. section 360bbb-3(b)(1), unless the authorization is terminated or revoked.  Performed at [facility], [address] [place], [place] [address]   Urine Culture     Status: None   Collection Time: 07/14/22  4:47 PM   Specimen: Urine, Catheterized  Result Value Ref Range Status   Specimen Description URINE, CATHETERIZED  Final   Special Requests NONE  Final   Culture   Final    NO GROWTH Performed at [facility], [address] [place], [place] [address]    Report Status 07/15/2022 FINAL  Final      Radiology Studies: CT Head Wo Contrast  Result Date: 07/14/2022 CLINICAL DATA:  Altered mental status. EXAM: CT HEAD WITHOUT CONTRAST TECHNIQUE: Contiguous axial images were obtained from the base of the skull through the vertex without intravenous contrast. RADIATION  DOSE REDUCTION: This exam was performed according to the departmental dose-optimization program which includes automated exposure control, adjustment of the mA and/or kV according to patient size and/or use of iterative reconstruction technique. COMPARISON:  06/14/2022 FINDINGS: Brain: Ventricles, cisterns and other CSF spaces are mildly prominent but unchanged and likely due to mild atrophy. There is chronic ischemic microvascular disease. Couple small old bilateral lacunar infarcts over the basal ganglia. Old right frontal infarct. No mass, mass effect, shift of midline structures or acute hemorrhage. No evidence of acute infarction. Vascular: No hyperdense vessel or unexpected calcification. Skull: Normal. Negative for fracture or focal lesion. Sinuses/Orbits: Orbits are normal. There is moderate opacification of the sinuses compatible with extensive inflammatory change as there is a suggestion of air-fluid levels over the maxillary sinuses as findings could be  seen with acute sinusitis. Other: None IMPRESSION: 1. No acute findings. 2. 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 Old right frontal infarct. 3. Moderate opacification of the sinuses compatible with extensive inflammatory change as there is a suggestion of air-fluid levels over the maxillary sinuses as findings could be seen with acute sinusitis. Electronically Signed   By: Marin Olp M.D.   On: 07/14/2022 15:43   DG Chest 2 View  Result Date: 07/14/2022 CLINICAL DATA:  Cough, congestion EXAM: CHEST - 2 VIEW COMPARISON:  11/09/2021 FINDINGS: No focal consolidation. No pleural effusion or pneumothorax. Heart and mediastinal contours are unremarkable. Thoracic aortic stent graft noted. No acute osseous abnormality. IMPRESSION: No active cardiopulmonary disease. Electronically Signed   By: Kathreen Devoid M.D.   On: 07/14/2022 15:30       LOS: 1 day   [facility] Pager on [email or web address]  07/16/2022, 8:49  AM

## 2022-07-17 DIAGNOSIS — I739 Peripheral vascular disease, unspecified: Secondary | ICD-10-CM | POA: Diagnosis not present

## 2022-07-17 DIAGNOSIS — R4182 Altered mental status, unspecified: Secondary | ICD-10-CM | POA: Diagnosis not present

## 2022-07-17 DIAGNOSIS — G40909 Epilepsy, unspecified, not intractable, without status epilepticus: Secondary | ICD-10-CM | POA: Diagnosis not present

## 2022-07-17 DIAGNOSIS — T420X1A Poisoning by hydantoin derivatives, accidental (unintentional), initial encounter: Secondary | ICD-10-CM | POA: Diagnosis not present

## 2022-07-17 LAB — BASIC METABOLIC PANEL
Anion gap: 12 (ref 5–15)
BUN: 13 mg/dL (ref 8–23)
CO2: 25 mmol/L (ref 22–32)
Calcium: 9.3 mg/dL (ref 8.9–10.3)
Chloride: 97 mmol/L — ABNORMAL LOW (ref 98–111)
Creatinine, Ser: 0.65 mg/dL (ref 0.61–1.24)
GFR, Estimated: >60 mL/min (ref >60)
Glucose, Bld: 90 mg/dL (ref 70–99)
Potassium: 4 mmol/L (ref 3.5–5.1)
Sodium: 134 mmol/L — ABNORMAL LOW (ref 135–145)

## 2022-07-17 LAB — PHENYTOIN LEVEL, TOTAL: Phenytoin Lvl: 20 ug/mL (ref 10.0–20.0)

## 2022-07-17 LAB — MAGNESIUM: Magnesium: 1.9 mg/dL (ref 1.7–2.4)

## 2022-07-17 NOTE — Progress Notes (Signed)
Pt oriented x1. CSW LM with pt sister.  CSW confirmed with [facility] that pt is long term resident there. Lurline Idol, MSW, LCSW 1/23/20244:01 PM

## 2022-07-17 NOTE — Plan of Care (Signed)

## 2022-07-17 NOTE — Plan of Care (Signed)
  Problem: Nutrition: Goal: Adequate nutrition will be maintained Outcome: Progressing   Problem: Education: Goal: Knowledge of General Education information will improve Description: Including pain rating scale, medication(s)/side effects and non-pharmacologic comfort measures Outcome: Not Progressing   Problem: Health Behavior/Discharge Planning: Goal: Ability to manage health-related needs will improve Outcome: Not Progressing

## 2022-07-17 NOTE — Progress Notes (Signed)
Phenytoin Initial Consult Indication: Sz  Allergies  Allergen Reactions   Isovue [Iopamidol] Hives    Pt broke out in one hive on his chest after contrast on 01/03/15.  Pt will need full premeds in the future per Dr Martinique.      Patient Measurements: Height: '5\' 8"'$  (172.7 cm) Weight: 40 kg (88 lb 2.9 oz) IBW/kg (Calculated) : 68.4 TPN AdjBW (KG): 40 Body mass index is 13.41 kg/m.   Vital signs: Temp: 97.6 F (36.4 C) (01/23 0836) Temp Source: Oral (01/23 0836) BP: 176/79 (01/23 0836) Pulse Rate: 88 (01/23 0836)  Labs: Lab Results  Component Value Date/Time   Phenytoin Lvl 20.0 07/17/2022 0641   Lab Results  Component Value Date   PHENYTOIN 20.0 07/17/2022   Estimated Creatinine Clearance: 52.8 mL/min (by C-G formula based on SCr of 0.65 mg/dL).   Medications:  Medications Prior to Admission  Medication Sig Dispense Refill Last Dose   acetaminophen (TYLENOL) 500 MG tablet Take 1,000 mg by mouth daily.   07/14/2022   Amino Acids-Protein Hydrolys (PRO-STAT) LIQD Take 30 mLs by mouth 2 (two) times daily.   07/14/2022   ascorbic acid (VITAMIN C) 500 MG tablet Take 500 mg by mouth daily.   07/13/2022   aspirin EC 81 MG tablet Take 1 tablet (81 mg total) by mouth daily. 150 tablet 11 07/14/2022   atenolol (TENORMIN) 50 MG tablet Take 1 tablet (50 mg total) by mouth daily. 30 tablet 2 07/14/2022 at 0830   busPIRone (BUSPAR) 10 MG tablet Take 10 mg by mouth 3 (three) times daily.   07/14/2022   cholecalciferol (GNP VITAMIN D3) 10 MCG (400 UNIT) TABS tablet Take 400 Units by mouth daily.   07/14/2022   clopidogrel (PLAVIX) 75 MG tablet Take 1 tablet (75 mg total) by mouth daily. 30 tablet 11 07/13/2022   docusate sodium (COLACE) 100 MG capsule Take 100 mg by mouth daily.   07/14/2022   ferrous gluconate (FERGON) 324 MG tablet Take 324 mg by mouth See admin instructions. 324 mg once daily on Monday, Thursday.   Past Week   gabapentin (NEURONTIN) 100 MG capsule Take 200 mg by mouth at  bedtime.   07/13/2022   guaifenesin (ROBITUSSIN) 100 MG/5ML syrup Take 100 mg by mouth 2 (two) times daily.   07/14/2022   Multiple Vitamins-Minerals (MULTIVITAMIN WITH MINERALS) tablet Take 1 tablet by mouth daily.   07/14/2022   oxyCODONE-acetaminophen (PERCOCET/ROXICET) 5-325 MG tablet Take 1 tablet by mouth every 4 (four) hours as needed (pain).   07/13/2022   pravastatin (PRAVACHOL) 40 MG tablet Take 40 mg by mouth at bedtime.   07/13/2022   phenytoin (DILANTIN) 100 MG ER capsule Take 1-2 capsules (100-200 mg total) by mouth See admin instructions. Take 100 mg daily in the morning; Take 200 mg at bedtime (Patient not taking: Reported on 07/14/2022) 90 capsule 0 Not Taking   Scheduled:   amoxicillin-clavulanate  1 tablet Oral Q12H   aspirin EC  81 mg Oral Daily   atenolol  50 mg Oral Daily   busPIRone  10 mg Oral TID   clopidogrel  75 mg Oral Daily   docusate sodium  100 mg Oral Daily   enoxaparin (LOVENOX) injection  30 mg Subcutaneous Q24H   feeding supplement  237 mL Oral BID BM   gabapentin  200 mg Oral QHS   multivitamin with minerals  1 tablet Oral Daily   pravastatin  40 mg Oral QHS   Infusions:  Assessment: Corrected phenytoin level (if needed): 23 Seizure activity: no Significant potential drug interactions:   Goals of care:  Total phenytoin level: 10-20 mcg/ml Free phenytoin level: 1-2 mcg/ml  Plan:  Continue to hold phenytoin for 1 more day Daily level Restart when level is in goal range  Alanda Slim, PharmD, [facility] Clinical Pharmacist Please see AMION for all Pharmacists' Contact Phone Numbers 07/17/2022, 10:20 AM

## 2022-07-17 NOTE — Progress Notes (Signed)
[facility] PROGRESS NOTE   [name]:[id number] DOB: 1955/10/24 DOA: 07/14/2022  PCP: Jeanette Caprice, PA-C  Brief History/Interval Summary: 67 y.o. 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  Admitted last month 12/21-12/29 for chronic left heel wound with osteomyelitis.  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  His altered mental status was felt to be due to infection versus polypharmacy.  His home gabapentin and mirtazapine were discontinued and dose of Dilantin was decreased due to elevated levels.   Presented due to elevated Dilantin levels at nursing facility with altered mental status.  Was hospitalized for further management.  Consultants: Neurology  Procedures: None    Subjective/Interval History: Patient continues to require TeleSitter and mittens.  Distracted this morning.  Denies any pain issues.     Assessment/Plan:  Phenytoin toxicity Patient presented with elevated levels at skilled nursing facility.  Dilantin has been held.  Neurology consulted to assist with management.  Pharmacy also following. Dilantin levels are improving.  At peak it was 38.6.  Improved to 20.0 this morning.  When corrected for albumin of 2.8 it is 23.0. Resumption of phenytoin to be deferred to neurology and pharmacy.    Acute encephalopathy, toxic Most likely due to Dilantin toxicity.  CT head was negative for acute findings.  Patient does have cognitive impairment at baseline due to history of traumatic brain injury. Mentation seems to be back to baseline. Still has some sundowning.  Continue to monitor.  Continue to reorient.  Acute sinusitis Noted on CT  scan. 5-day course of Augmentin was prescribed  Seizure disorder Dilantin on hold currently.  See discussion above.  Essential hypertension Blood pressure is poorly controlled partly because of agitation.  Continue atenolol for now.  May need to adjust dose or add agents if blood pressure remains elevated. Stable for the most part.  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 Vascular surgery notified of this patient.  Underwent staple removal from the right AKA stump.  Due to pain issues they will defer left AKA stump staple removal to outpatient setting.  Patient has an appointment with vascular team on 1/29.  History of mood disorder Continue BuSpar  Chronic pain/neuropathy Continue gabapentin.  DVT Prophylaxis: Lovenox Code Status: Full code Family Communication: No family at bedside Disposition Plan: Hopefully back to SNF when improved  Status is: Inpatient Remains inpatient appropriate because: Phenytoin toxicity      Medications: Scheduled:  amoxicillin-clavulanate  1 tablet Oral Q12H   aspirin EC  81 mg Oral Daily   atenolol  50 mg Oral Daily   busPIRone  10 mg Oral TID   clopidogrel  75 mg Oral Daily   docusate sodium  100 mg Oral Daily   enoxaparin (LOVENOX) injection  30 mg Subcutaneous Q24H   feeding supplement  237 mL Oral BID BM   gabapentin  200 mg Oral QHS   multivitamin with minerals  1 tablet Oral Daily   pravastatin  40 mg Oral QHS   Continuous:   DDU:[id number] **OR** acetaminophen, hydrALAZINE, mouth rinse  Antibiotics: Anti-infectives (From admission, onward)  Start     Dose/Rate Route Frequency Ordered Stop   07/14/22 2245  amoxicillin-clavulanate (AUGMENTIN) 875-125 MG per tablet 1 tablet        1 tablet Oral Every 12 hours 07/14/22 2232 07/19/22 2159        Objective:  Vital Signs  Vitals:   07/16/22 0802 07/16/22 1300 07/16/22 1945 07/17/22 0836  BP: (!) 154/82 (!) 149/82 (!) 147/81 (!) 176/79  Pulse: 82 80 80 88  Resp: '18 15 14   '$ Temp: 98.3 F (36.8 C) 98.3 F (36.8 C)  97.6 F (36.4 C)  TempSrc: Oral Oral  Oral  SpO2: 100% 100% 100% 100%  Weight:      Height:       No intake or output data in the 24 hours ending 07/17/22 0907  Filed Weights   07/14/22 1336  Weight: 40 kg    General appearance: Awake alert.  In no distress.  Distracted Resp: Clear to auscultation bilaterally.  Normal effort Cardio: S1-S2 is normal regular.  No S3-S4.  No rubs murmurs or bruit GI: Abdomen is soft.  Nontender nondistended.  Bowel sounds are present normal.  No masses organomegaly Extremities: Bilateral AKA  Lab Results:  Data Reviewed: I have personally reviewed following labs and reports of the imaging studies  CBC: Recent Labs  Lab 07/14/22 1541 07/16/22 0341  WBC 9.1 8.2  NEUTROABS 4.1  --   HGB 10.6* 11.0*  HCT 35.3* 34.8*  MCV 93.4 90.4  PLT 404* 402*     Basic Metabolic Panel: Recent Labs  Lab 07/14/22 1541 07/16/22 0341 07/17/22 0641  NA 133* 133* 134*  K 3.9 3.9 4.0  CL 97* 98 97*  CO2 '28 24 25  '$ GLUCOSE 113* 99 90  BUN '20 13 13  '$ CREATININE 0.70 0.69 0.65  CALCIUM 8.6* 9.0 9.3  MG  --  1.8 1.9     GFR: Estimated Creatinine Clearance: 52.8 mL/min (by C-G formula based on SCr of 0.65 mg/dL).  Liver Function Tests: Recent Labs  Lab 07/14/22 1541  AST 26  ALT 19  ALKPHOS 96  BILITOT 0.2*  PROT 7.3  ALBUMIN 2.8*     Recent Labs  Lab 07/14/22 1541  AMMONIA 27      Recent Results (from the past 240 hour(s))  Resp panel by RT-PCR (RSV, Flu A&B, Covid) Anterior Nasal Swab     Status: None   Collection Time: 07/14/22  2:53 PM   Specimen: Anterior Nasal Swab  Result Value Ref Range Status   SARS Coronavirus 2 by RT PCR NEGATIVE NEGATIVE Final    Comment: (NOTE) SARS-CoV-2 target nucleic  acids are NOT DETECTED.  The SARS-CoV-2 RNA is generally detectable in upper respiratory specimens during the acute phase of infection. The lowest concentration of SARS-CoV-2 viral copies this assay can detect is 138 copies/mL. A negative result does not preclude SARS-Cov-2 infection and should not be used as the sole basis for treatment or other patient management decisions. A negative result may occur with  improper specimen collection/handling, submission of specimen other than nasopharyngeal swab, presence of viral mutation(s) within the areas targeted by this assay, and inadequate number of viral copies(<138 copies/mL). A negative result must be combined with clinical observations, patient history, and epidemiological information. The expected result is Negative.  Fact Sheet for Patients:  EntrepreneurPulse.com.au  Fact Sheet for Healthcare Providers:  IncredibleEmployment.be  This test is no t yet approved or cleared by the [place] and  has been authorized  for detection and/or diagnosis of SARS-CoV-2 by FDA under an Emergency Use Authorization (EUA). This EUA will remain  in effect (meaning this test can be used) for the duration of the COVID-19 declaration under Section 564(b)(1) of the Act, 21 U.S.C.section 360bbb-3(b)(1), unless the authorization is terminated  or revoked sooner.       Influenza A by PCR NEGATIVE NEGATIVE Final   Influenza B by PCR NEGATIVE NEGATIVE Final    Comment: (NOTE) The Xpert Xpress SARS-CoV-2/FLU/RSV plus assay is intended as an aid in the diagnosis of influenza from Nasopharyngeal swab specimens and should not be used as a sole basis for treatment. Nasal washings and aspirates are unacceptable for Xpert Xpress SARS-CoV-2/FLU/RSV testing.  Fact Sheet for Patients: EntrepreneurPulse.com.au  Fact Sheet for Healthcare Providers: IncredibleEmployment.be  This  test is not yet approved or cleared by the [place] FDA and has been authorized for detection and/or diagnosis of SARS-CoV-2 by FDA under an Emergency Use Authorization (EUA). This EUA will remain in effect (meaning this test can be used) for the duration of the COVID-19 declaration under Section 564(b)(1) of the Act, 21 U.S.C. section 360bbb-3(b)(1), unless the authorization is terminated or revoked.     Resp Syncytial Virus by PCR NEGATIVE NEGATIVE Final    Comment: (NOTE) Fact Sheet for Patients: EntrepreneurPulse.com.au  Fact Sheet for Healthcare Providers: IncredibleEmployment.be  This test is not yet approved or cleared by the [place] FDA and has been authorized for detection and/or diagnosis of SARS-CoV-2 by FDA under an Emergency Use Authorization (EUA). This EUA will remain in effect (meaning this test can be used) for the duration of the COVID-19 declaration under Section 564(b)(1) of the Act, 21 U.S.C. section 360bbb-3(b)(1), unless the authorization is terminated or revoked.  Performed at [facility], [place] [address] [place], [place] [address]   Urine Culture     Status: None   Collection Time: 07/14/22  4:47 PM   Specimen: Urine, Catheterized  Result Value Ref Range Status   Specimen Description URINE, CATHETERIZED  Final   Special Requests NONE  Final   Culture   Final    NO GROWTH Performed at [facility], [address] [place], [place] [address]    Report Status 07/15/2022 FINAL  Final      Radiology Studies: No results found.     LOS: 2 days   [facility] Pager on [email or web address]  07/17/2022, 9:07 AM

## 2022-07-18 DIAGNOSIS — T420X1A Poisoning by hydantoin derivatives, accidental (unintentional), initial encounter: Secondary | ICD-10-CM | POA: Diagnosis not present

## 2022-07-18 DIAGNOSIS — I739 Peripheral vascular disease, unspecified: Secondary | ICD-10-CM | POA: Diagnosis not present

## 2022-07-18 DIAGNOSIS — G40909 Epilepsy, unspecified, not intractable, without status epilepticus: Secondary | ICD-10-CM | POA: Diagnosis not present

## 2022-07-18 DIAGNOSIS — R4182 Altered mental status, unspecified: Secondary | ICD-10-CM | POA: Diagnosis not present

## 2022-07-18 LAB — ALBUMIN: Albumin: 2.9 g/dL — ABNORMAL LOW (ref 3.5–5.0)

## 2022-07-18 LAB — PHENYTOIN LEVEL, TOTAL: Phenytoin Lvl: 9.1 ug/mL — ABNORMAL LOW (ref 10.0–20.0)

## 2022-07-18 MED ORDER — PHENYTOIN SODIUM EXTENDED 100 MG PO CAPS
100.0000 mg | ORAL_CAPSULE | Freq: Every day | ORAL | Status: DC
  Administered 2022-07-18 – 2022-07-19 (×2): 100 mg via ORAL
  Filled 2022-07-18 (×2): qty 1

## 2022-07-18 MED ORDER — PHENYTOIN 50 MG PO CHEW
50.0000 mg | CHEWABLE_TABLET | Freq: Every day | ORAL | Status: DC
  Administered 2022-07-18 – 2022-07-19 (×2): 50 mg via ORAL
  Filled 2022-07-18 (×2): qty 1

## 2022-07-18 MED ORDER — OXYCODONE-ACETAMINOPHEN 5-325 MG PO TABS: 1.0000 | ORAL_TABLET | ORAL | 0 refills | Status: AC | PRN

## 2022-07-18 MED ORDER — PHENYTOIN SODIUM EXTENDED 100 MG PO CAPS
200.0000 mg | ORAL_CAPSULE | Freq: Every day | ORAL | Status: DC
  Administered 2022-07-18: 200 mg via ORAL
  Filled 2022-07-18 (×2): qty 2

## 2022-07-18 NOTE — Plan of Care (Signed)

## 2022-07-18 NOTE — NC FL2 (Signed)
[place] LEVEL OF CARE FORM     IDENTIFICATION  Patient Name: [name] Birthdate: Feb 07, 1956 Sex: male Admission Date (Current Location): 07/14/2022  [place] and [place] Number:  [name] [id number] [place] and Address:  [facility] [place], [place] [address] [place], [place] [address]      Provider Number: [id number]  Attending Physician Name and Address:  Bonnielee Haff, MD  Relative Name and Phone Number:  [place], Hinch [phone]    Current Level of Care: Hospital Recommended Level of Care: [place] Prior Approval Number:    Date Approved/Denied:   PASRR Number: [id number]  Discharge Plan: SNF    Current Diagnoses: Patient Active Problem List   Diagnosis Date Noted   Dilantin toxicity 07/15/2022   Phenytoin toxicity 07/14/2022   Seizure disorder ([place]) 07/14/2022   Acute sinusitis 07/14/2022   Chronic pain 38/03/1750   Acute metabolic encephalopathy 02/58/5277   UTI (urinary tract infection) 06/15/2022   Chronic ulcer of left heel ([place]) 82/42/3536   Complicated UTI (urinary tract infection) 06/14/2022   Anxiety    PVD (peripheral vascular disease) ([place]) 02/06/2022   Protein-calorie malnutrition, severe 06/15/2021   Subdural hematoma ([place]) 06/14/2021   Dementia without behavioral disturbance ([place]) 04/25/2021   Lung nodule    AAA (abdominal aortic aneurysm) without rupture ([place]) 10/21/2012   Thoracic aneurysm without mention of rupture 10/21/2012   Hyperlipidemia 10/18/2012   Poor short term memory 10/18/2012   Depression 10/16/2012   Dry skin 10/16/2012   Urge urinary incontinence 08/26/2012   Lumbosacral radiculopathy at L4 07/30/2012   Hyperextension of knee or lower leg 05/08/2012   Shoulder pain, right 02/16/2012   Healthcare maintenance 02/16/2012   Cardiomyopathy, nonischemic ([place]) 02/14/2012   Short-term memory loss 02/14/2012   Hyponatremia 10/18/2011    Seizures ([place]) 10/18/2011   Hypertension 10/18/2011   TBI (traumatic brain injury) ([place]) 10/18/2011   H/O alcohol abuse 10/18/2011   Tobacco abuse 10/18/2011    Orientation RESPIRATION BLADDER Height & Weight     Self  Normal Incontinent, External catheter Weight: 88 lb 2.9 oz (40 kg) Height:  '5\' 8"'$  (172.7 cm)  BEHAVIORAL SYMPTOMS/MOOD NEUROLOGICAL BOWEL NUTRITION STATUS    Convulsions/Seizures Incontinent Diet (see discharge summary)  AMBULATORY STATUS COMMUNICATION OF NEEDS Skin   Total Care Verbally Normal                       Personal Care Assistance Level of Assistance  Bathing, Feeding, Dressing Bathing Assistance: Maximum assistance Feeding assistance: Limited assistance Dressing Assistance: Maximum assistance     Functional Limitations Info  Sight, Hearing, Speech Sight Info: Adequate Hearing Info: Adequate Speech Info: Adequate    SPECIAL CARE FACTORS FREQUENCY                       Contractures Contractures Info: Not present    Additional Factors Info  Code Status, Allergies Code Status Info: full Allergies Info: Isovue (Iopamidol)           Current Medications (07/18/2022):  This is the current hospital active medication list Current Facility-Administered Medications  Medication Dose Route Frequency Provider Last Rate Last Admin   acetaminophen (TYLENOL) tablet 650 mg  650 mg Oral Q6H PRN Shela Leff, MD   650 mg at 07/17/22 2047   Or   acetaminophen (TYLENOL) suppository 650 mg  650 mg Rectal Q6H PRN Shela Leff, MD       amoxicillin-clavulanate (  AUGMENTIN) 875-125 MG per tablet 1 tablet  1 tablet Oral Q12H Shela Leff, MD   1 tablet at 07/18/22 0071   aspirin EC tablet 81 mg  81 mg Oral Daily Shela Leff, MD   81 mg at 07/18/22 0808   atenolol (TENORMIN) tablet 50 mg  50 mg Oral Daily Shela Leff, MD   50 mg at 07/18/22 2197   busPIRone (BUSPAR) tablet 10 mg  10 mg Oral TID Shela Leff, MD    10 mg at 07/18/22 5883   clopidogrel (PLAVIX) tablet 75 mg  75 mg Oral Daily Shela Leff, MD   75 mg at 07/18/22 2549   docusate sodium (COLACE) capsule 100 mg  100 mg Oral Daily Shela Leff, MD   100 mg at 07/17/22 0802   enoxaparin (LOVENOX) injection 30 mg  30 mg Subcutaneous Q24H Bonnielee Haff, MD   30 mg at 07/18/22 8264   feeding supplement (ENSURE ENLIVE / ENSURE PLUS) liquid 237 mL  237 mL Oral BID BM Bonnielee Haff, MD   237 mL at 07/18/22 0809   gabapentin (NEURONTIN) capsule 200 mg  200 mg Oral QHS Shela Leff, MD   200 mg at 07/17/22 2047   hydrALAZINE (APRESOLINE) injection 5 mg  5 mg Intravenous Q6H PRN Bonnielee Haff, MD       multivitamin with minerals tablet 1 tablet  1 tablet Oral Daily Bonnielee Haff, MD   1 tablet at 07/18/22 1583   Oral care mouth rinse  15 mL Mouth Rinse PRN Bonnielee Haff, MD       phenytoin (DILANTIN) ER capsule 100 mg  100 mg Oral Daily Alvira Philips, RPH   100 mg at 07/18/22 0935   And   phenytoin (DILANTIN) chewable tablet 50 mg  50 mg Oral Daily Alvira Philips, [place]   50 mg at 07/18/22 0940   phenytoin (DILANTIN) ER capsule 200 mg  200 mg Oral QHS [place], Donalynn Furlong, RPH       pravastatin (PRAVACHOL) tablet 40 mg  40 mg Oral QHS Shela Leff, MD   40 mg at 07/17/22 2048     Discharge Medications: Please see discharge summary for a list of discharge medications.  Relevant Imaging Results:  Relevant Lab Results:   Additional Information [id number]  Joanne Chars, LCSW

## 2022-07-18 NOTE — TOC Initial Note (Signed)
Transition of Care [facility]) - Initial/Assessment Note    Patient Details  Name: [name] MRN: [id number] Date of Birth: 01-24-1956  [facility] CM/SW Contact:    Joanne Chars, LCSW Phone Number: 07/18/2022, 10:40 AM  Clinical Narrative:   Pt oriented x1, CSW spoke with sister [name] who reports she is POA but not legal guardian.  She confirmed pt is from [place] long term, she does want pt to return to [place] at [facility].    CSW messaged Kristal/[facility] who confirmed pt can return whenever ready for DC.                 Expected Discharge Plan: Long Term Nursing Home Barriers to Discharge: Continued Medical Work up   Patient Goals and CMS Choice     Choice offered to / list presented to : Sibling (sister [name])      Expected Discharge Plan and Services In-house Referral: Clinical Social Work   Post Acute Care Choice: [place] Living arrangements for the past 2 months: [place]                                      Prior Living Arrangements/Services Living arrangements for the past 2 months: [place] Lives with:: Facility Resident              Current home services: Other (comment) (na)    Activities of Daily Living Home Assistive Devices/Equipment: Wheelchair ADL Screening (condition at time of admission) Patient's cognitive ability adequate to safely complete daily activities?: No Is the patient deaf or have difficulty hearing?: No Does the patient have difficulty seeing, even when wearing glasses/contacts?: No Does the patient have difficulty concentrating, remembering, or making decisions?: No Patient able to express need for assistance with ADLs?: No Does the patient have difficulty dressing or bathing?: No Independently performs ADLs?: No Communication: Independent Dressing (OT): Needs assistance Is this a change from baseline?: Pre-admission baseline Grooming: Needs  assistance Is this a change from baseline?: Pre-admission baseline Feeding: Needs assistance Is this a change from baseline?: Pre-admission baseline Bathing: Dependent Is this a change from baseline?: Pre-admission baseline Toileting: Dependent Is this a change from baseline?: Pre-admission baseline In/Out Bed: Dependent Is this a change from baseline?: Pre-admission baseline Walks in Home: Dependent Is this a change from baseline?: Pre-admission baseline Does the patient have difficulty walking or climbing stairs?: Yes Weakness of Legs: Both Weakness of Arms/Hands: None  Permission Sought/Granted                  Emotional Assessment Appearance:: Appears stated age Attitude/Demeanor/Rapport: Unable to Assess Affect (typically observed): Unable to Assess Orientation: : Oriented to Self      Admission diagnosis:  Dilantin toxicity [T42.0X1A] Phenytoin toxicity [T42.0X1A] Phenytoin poisoning of undetermined intent, initial encounter [T42.0X4A] Patient Active Problem List   Diagnosis Date Noted   Dilantin toxicity 07/15/2022   Phenytoin toxicity 07/14/2022   Seizure disorder ([place]) 07/14/2022   Acute sinusitis 07/14/2022   Chronic pain 12/16/7626   Acute metabolic encephalopathy 31/51/7616   UTI (urinary tract infection) 06/15/2022   Chronic ulcer of left heel ([place]) 07/37/1062   Complicated UTI (urinary tract infection) 06/14/2022   Anxiety    PVD (peripheral vascular disease) ([place]) 02/06/2022   Protein-calorie malnutrition, severe 06/15/2021   Subdural hematoma ([place]) 06/14/2021   Dementia without behavioral disturbance ([place]) 04/25/2021   Lung  nodule    AAA (abdominal aortic aneurysm) without rupture ([place]) 10/21/2012   Thoracic aneurysm without mention of rupture 10/21/2012   Hyperlipidemia 10/18/2012   Poor short term memory 10/18/2012   Depression 10/16/2012   Dry skin 10/16/2012   Urge urinary incontinence 08/26/2012   Lumbosacral radiculopathy at L4  07/30/2012   Hyperextension of knee or lower leg 05/08/2012   Shoulder pain, right 02/16/2012   Healthcare maintenance 02/16/2012   Cardiomyopathy, nonischemic ([place]) 02/14/2012   Short-term memory loss 02/14/2012   Hyponatremia 10/18/2011   Seizures ([place]) 10/18/2011   Hypertension 10/18/2011   TBI (traumatic brain injury) ([place]) 10/18/2011   H/O alcohol abuse 10/18/2011   Tobacco abuse 10/18/2011   PCP:  Jeanette Caprice, PA-C Pharmacy:  No Pharmacies Listed    Social Determinants of Health (SDOH) Social History: SDOH Screenings   Food Insecurity: No Food Insecurity (07/15/2022)  Housing: Low Risk  (07/15/2022)  Transportation Needs: No Transportation Needs (07/15/2022)  Utilities: Not At Risk (07/15/2022)  Tobacco Use: Medium Risk (07/14/2022)   SDOH Interventions:     Readmission Risk Interventions    06/15/2022   11:08 AM 02/14/2022   12:07 PM  Readmission Risk Prevention Plan  Transportation Screening Complete Complete  PCP or Specialist Appt within 5-7 Days Complete   PCP or Specialist Appt within 3-5 Days  Complete  Home Care Screening Complete   Medication Review (RN CM) Complete   HRI or Home Care Consult  Complete  Social Work Consult for [place] Planning/Counseling  Complete  Palliative Care Screening  Complete  Medication Review Press photographer)  Complete

## 2022-07-18 NOTE — Progress Notes (Signed)
[facility] PROGRESS NOTE   MAKENZIE VITTORIO EVO:[id number] DOB: Mar 22, 1956 DOA: 07/14/2022  PCP: Jeanette Caprice, PA-C  Brief History/Interval Summary: 67 y.o. 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  Admitted last month 12/21-12/29 for chronic left heel wound with osteomyelitis.  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  His altered mental status was felt to be due to infection versus polypharmacy.  His home gabapentin and mirtazapine were discontinued and dose of Dilantin was decreased due to elevated levels.   Presented due to elevated Dilantin levels at nursing facility with altered mental status.  Was hospitalized for further management.  Consultants: Neurology  Procedures: None    Subjective/Interval History: Patient seems to be doing better.  Off of mittens this morning.  Denies any pain issues.     Assessment/Plan:  Phenytoin toxicity Patient presented with elevated levels at skilled nursing facility.  Dilantin was held.  Neurology was consulted.  Pharmacy was also following. Dilantin levels gradually improved and down to a corrected level of 13.4 today. Okay to discontinue telemetry.    Seizure disorder Dilantin levels have improved.  Goal is to have a total phenytoin level between 10 to 20 mcg/mL.  Free phenytoin level should be between 1 to 2 mcg/mL.  Started on Dilantin 150 mg every morning and 200 mg nightly.  Levels to be checked in 5 to 7 days.  Dose increases should not exceed 100 mg at a time because a small dose adjustment may result in disproportionate concentration change.    Acute encephalopathy, toxic Most likely due to Dilantin toxicity.   CT head was negative for acute findings.  Patient does have cognitive impairment at baseline due to history of traumatic brain injury. Mentation seems to be back to baseline. Still has some sundowning.  Continue to monitor.  Continue to reorient.  Acute sinusitis Noted on CT scan. 5-day course of Augmentin was prescribed  Essential hypertension Blood pressure was elevated due to agitation.  Seems to have improved.  Continue to monitor.  Continue just atenolol for now.    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 Vascular surgery notified of this patient.  Underwent staple removal from the right AKA stump.  Due to pain issues they will defer left AKA stump staple removal to outpatient setting.  Patient has an appointment with vascular team on 1/29.  History of mood disorder Continue BuSpar  Chronic pain/neuropathy Continue gabapentin.  DVT Prophylaxis: Lovenox Code Status: Full code Family Communication: No family at bedside Disposition Plan: Anticipate discharge back to SNF on 1/25.  Status is: Inpatient Remains inpatient appropriate because: Phenytoin toxicity      Medications: Scheduled:  amoxicillin-clavulanate  1 tablet Oral Q12H   aspirin EC  81 mg Oral Daily   atenolol  50 mg Oral Daily   busPIRone  10 mg Oral TID   clopidogrel  75 mg Oral Daily   docusate sodium  100 mg Oral Daily   enoxaparin (LOVENOX) injection  30 mg Subcutaneous Q24H   feeding supplement  237 mL Oral BID BM   gabapentin  200 mg Oral QHS  multivitamin with minerals  1 tablet Oral Daily   phenytoin  100 mg Oral Daily   And   phenytoin  50 mg Oral Daily   phenytoin  200 mg Oral QHS   pravastatin  40 mg Oral QHS   Continuous:   YIF:OYDXAJOINOMVE **OR** acetaminophen, hydrALAZINE, mouth rinse  Antibiotics: Anti-infectives (From  admission, onward)    Start     Dose/Rate Route Frequency Ordered Stop   07/14/22 2245  amoxicillin-clavulanate (AUGMENTIN) 875-125 MG per tablet 1 tablet        1 tablet Oral Every 12 hours 07/14/22 2232 07/19/22 2159       Objective:  Vital Signs  Vitals:   07/17/22 1313 07/17/22 1918 07/18/22 0455 07/18/22 0810  BP: 131/73 139/66 (!) 149/71 (!) 142/72  Pulse: 84 80 79 78  Resp: '16 18 19 15  '$ Temp: 97.8 F (36.6 C) 98 F (36.7 C) 98.2 F (36.8 C) 98.2 F (36.8 C)  TempSrc: Oral Oral Oral Oral  SpO2: 98% 100% 100% 100%  Weight:      Height:       No intake or output data in the 24 hours ending 07/18/22 0956  Filed Weights   07/14/22 1336  Weight: 40 kg    General appearance: Awake alert.  In no distress.  Mildly distracted Resp: Clear to auscultation bilaterally.  Normal effort Cardio: S1-S2 is normal regular.  No S3-S4.  No rubs murmurs or bruit GI: Abdomen is soft.  Nontender nondistended.  Bowel sounds are present normal.  No masses organomegaly Extremities: Bilateral BKA   Lab Results:  Data Reviewed: I have personally reviewed following labs and reports of the imaging studies  CBC: Recent Labs  Lab 07/14/22 1541 07/16/22 0341  WBC 9.1 8.2  NEUTROABS 4.1  --   HGB 10.6* 11.0*  HCT 35.3* 34.8*  MCV 93.4 90.4  PLT 404* 402*     Basic Metabolic Panel: Recent Labs  Lab 07/14/22 1541 07/16/22 0341 07/17/22 0641  NA 133* 133* 134*  K 3.9 3.9 4.0  CL 97* 98 97*  CO2 '28 24 25  '$ GLUCOSE 113* 99 90  BUN '20 13 13  '$ CREATININE 0.70 0.69 0.65  CALCIUM 8.6* 9.0 9.3  MG  --  1.8 1.9     GFR: Estimated Creatinine Clearance: 52.8 mL/min (by C-G formula based on SCr of 0.65 mg/dL).  Liver Function Tests: Recent Labs  Lab 07/14/22 1541 07/18/22 0228  AST 26  --   ALT 19  --   ALKPHOS 96  --   BILITOT 0.2*  --   PROT 7.3  --   ALBUMIN 2.8* 2.9*     Recent Labs  Lab 07/14/22 1541  AMMONIA 27      Recent Results (from the past 240  hour(s))  Resp panel by RT-PCR (RSV, Flu A&B, Covid) Anterior Nasal Swab     Status: None   Collection Time: 07/14/22  2:53 PM   Specimen: Anterior Nasal Swab  Result Value Ref Range Status   SARS Coronavirus 2 by RT PCR NEGATIVE NEGATIVE Final    Comment: (NOTE) SARS-CoV-2 target nucleic acids are NOT DETECTED.  The SARS-CoV-2 RNA is generally detectable in upper respiratory specimens during the acute phase of infection. The lowest concentration of SARS-CoV-2 viral copies this assay can detect is 138 copies/mL. A negative result does not preclude SARS-Cov-2 infection and should not be used as the sole basis for treatment or other patient management decisions. A negative result may occur  with  improper specimen collection/handling, submission of specimen other than nasopharyngeal swab, presence of viral mutation(s) within the areas targeted by this assay, and inadequate number of viral copies(<138 copies/mL). A negative result must be combined with clinical observations, patient history, and epidemiological information. The expected result is Negative.  Fact Sheet for Patients:  EntrepreneurPulse.com.au  Fact Sheet for Healthcare Providers:  IncredibleEmployment.be  This test is no t yet approved or cleared by the [place] FDA and  has been authorized for detection and/or diagnosis of SARS-CoV-2 by FDA under an Emergency Use Authorization (EUA). This EUA will remain  in effect (meaning this test can be used) for the duration of the COVID-19 declaration under Section 564(b)(1) of the Act, 21 U.S.C.section 360bbb-3(b)(1), unless the authorization is terminated  or revoked sooner.       Influenza A by PCR NEGATIVE NEGATIVE Final   Influenza B by PCR NEGATIVE NEGATIVE Final    Comment: (NOTE) The Xpert Xpress SARS-CoV-2/FLU/RSV plus assay is intended as an aid in the diagnosis of influenza from Nasopharyngeal swab specimens and should not  be used as a sole basis for treatment. Nasal washings and aspirates are unacceptable for Xpert Xpress SARS-CoV-2/FLU/RSV testing.  Fact Sheet for Patients: EntrepreneurPulse.com.au  Fact Sheet for Healthcare Providers: IncredibleEmployment.be  This test is not yet approved or cleared by the [place] FDA and has been authorized for detection and/or diagnosis of SARS-CoV-2 by FDA under an Emergency Use Authorization (EUA). This EUA will remain in effect (meaning this test can be used) for the duration of the COVID-19 declaration under Section 564(b)(1) of the Act, 21 U.S.C. section 360bbb-3(b)(1), unless the authorization is terminated or revoked.     Resp Syncytial Virus by PCR NEGATIVE NEGATIVE Final    Comment: (NOTE) Fact Sheet for Patients: EntrepreneurPulse.com.au  Fact Sheet for Healthcare Providers: IncredibleEmployment.be  This test is not yet approved or cleared by the [place] FDA and has been authorized for detection and/or diagnosis of SARS-CoV-2 by FDA under an Emergency Use Authorization (EUA). This EUA will remain in effect (meaning this test can be used) for the duration of the COVID-19 declaration under Section 564(b)(1) of the Act, 21 U.S.C. section 360bbb-3(b)(1), unless the authorization is terminated or revoked.  Performed at [facility], [place] [address] [place], [place] [address]   Urine Culture     Status: None   Collection Time: 07/14/22  4:47 PM   Specimen: Urine, Catheterized  Result Value Ref Range Status   Specimen Description URINE, CATHETERIZED  Final   Special Requests NONE  Final   Culture   Final    NO GROWTH Performed at [facility], [address], [place], [place] [address]    Report Status 07/15/2022 FINAL  Final      Radiology Studies: No results found.     LOS: 3 days   [place] [employer] on  [email or web address]  07/18/2022, 9:56 AM

## 2022-07-18 NOTE — Progress Notes (Signed)
Phenytoin Initial Consult Indication: Sz  Allergies  Allergen Reactions   Isovue [Iopamidol] Hives    Pt broke out in one hive on his chest after contrast on 01/03/15.  Pt will need full premeds in the future per Dr Martinique.      Patient Measurements: Height: '5\' 8"'$  (172.7 cm) Weight: 40 kg (88 lb 2.9 oz) IBW/kg (Calculated) : 68.4 TPN AdjBW (KG): 40 Body mass index is 13.41 kg/m.   Vital signs: Temp: 98.2 F (36.8 C) (01/24 0455) Temp Source: Oral (01/24 0455) BP: 149/71 (01/24 0455) Pulse Rate: 79 (01/24 0455)  Labs: Lab Results  Component Value Date/Time   Phenytoin Lvl 9.1 (L) 07/18/2022 0228    Estimated Creatinine Clearance: 52.8 mL/min (by C-G formula based on SCr of 0.65 mg/dL).   Medications:  Scheduled:   amoxicillin-clavulanate  1 tablet Oral Q12H   aspirin EC  81 mg Oral Daily   atenolol  50 mg Oral Daily   busPIRone  10 mg Oral TID   clopidogrel  75 mg Oral Daily   docusate sodium  100 mg Oral Daily   enoxaparin (LOVENOX) injection  30 mg Subcutaneous Q24H   feeding supplement  237 mL Oral BID BM   gabapentin  200 mg Oral QHS   multivitamin with minerals  1 tablet Oral Daily   pravastatin  40 mg Oral QHS    Assessment: Appears patient had a 200 mg dose change in early Jan. Phenytoin has been on hold since admit. Phenytoin level is now within goal range.   Corrected phenytoin level (if needed): 13.4 Albumin: 2.9 Seizure activity: no Significant potential drug interactions:   Goals of care:  Total phenytoin level: 10-20 mcg/ml Free phenytoin level: 1-2 mcg/ml  Plan:  Phenytoin 150 mg PO qam and 200 mg qhs Check level in 5-7 days Dose increases should not exceed 100 mg at a time because a small dose adjustment may result in a disproportionate concentration change  Thank you for involving pharmacy in this patient's care.  Renold Genta, PharmD, BCPS Clinical Pharmacist Clinical phone for 07/18/2022 is [phone] 07/18/2022 8:03 AM

## 2022-07-19 DIAGNOSIS — T420X1A Poisoning by hydantoin derivatives, accidental (unintentional), initial encounter: Secondary | ICD-10-CM | POA: Diagnosis not present

## 2022-07-19 LAB — VITAMIN B1: Vitamin B1 (Thiamine): 140.9 nmol/L (ref 66.5–200.0)

## 2022-07-19 MED ORDER — PHENYTOIN 50 MG PO CHEW: 50.0000 mg | CHEWABLE_TABLET | Freq: Every day | ORAL | Status: DC

## 2022-07-19 MED ORDER — PHENYTOIN SODIUM EXTENDED 100 MG PO CAPS: 100.0000 mg | ORAL_CAPSULE | Freq: Every day | ORAL | Status: AC

## 2022-07-19 MED ORDER — PHENYTOIN SODIUM EXTENDED 200 MG PO CAPS: 200.0000 mg | ORAL_CAPSULE | Freq: Every day | ORAL | Status: AC

## 2022-07-19 NOTE — TOC Transition Note (Signed)
Transition of Care [facility]) - CM/SW Discharge Note   Patient Details  Name: [name] MRN: [id number] Date of Birth: 02-03-1956  Transition of Care [facility] CM/SW Contact:  Joanne Chars, LCSW Phone Number: 07/19/2022, 11:05 AM   Clinical Narrative:   Pt discharging to [facility], room 106.  RN call report to [phone].  1000: CSW confirmed with [facility] that they are able to receive pt today.   Final next level of care: Skilled Nursing Facility Barriers to Discharge: Barriers Resolved   Patient Goals and CMS Choice   Choice offered to / list presented to : Sibling (sister [name])  Discharge Placement                Patient chooses bed at:  [facility]) Patient to be transferred to facility by: [place] Name of family member notified: sister [name] Patient and family notified of of transfer: 07/19/22  Discharge Plan and Services Additional resources added to the After Visit Summary for   In-house Referral: Clinical Social Work   Post Acute Care Choice: [facility]                               Social Determinants of Health (Central) Interventions SDOH Screenings   Food Insecurity: No Food Insecurity (07/15/2022)  Housing: Low Risk  (07/15/2022)  Transportation Needs: No Transportation Needs (07/15/2022)  Utilities: Not At Risk (07/15/2022)  Tobacco Use: Medium Risk (07/14/2022)     Readmission Risk Interventions    06/15/2022   11:08 AM 02/14/2022   12:07 PM  Readmission Risk Prevention Plan  Transportation Screening Complete Complete  PCP or Specialist Appt within 5-7 Days Complete   PCP or Specialist Appt within 3-5 Days  Complete  Home Care Screening Complete   Medication Review (RN CM) Complete   HRI or [place]  Complete  Social Work Consult for [place] Planning/Counseling  Complete  Palliative Care Screening  Complete  Medication Review Press photographer)  Complete

## 2022-07-19 NOTE — Discharge Summary (Signed)
[facility]  Physician Discharge Summary   Patient ID: [name] MRN: [id number] DOB/AGE: Sep 29, 1955 67 y.o.  Admit date: 07/14/2022 Discharge date:   07/19/2022   PCP: Kurth-Bowen, Cornelia, PA-C  DISCHARGE DIAGNOSES:    Phenytoin toxicity   PVD (peripheral vascular disease) ([place])   Seizure disorder (H[facility])   Acute sinusitis   Chronic pain   RECOMMENDATIONS FOR OUTPATIENT FOLLOW UP: Please check Dilantin level in 4 to 6 days.  See below for further instructions   Home Health: SNF Equipment/Devices: None  CODE STATUS: Full code  DISCHARGE CONDITION: fair  Diet recommendation: Regular  INITIAL HISTORY: 67 y.o. 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  Admitted last month 12/21-12/29 for chronic left heel wound with osteomyelitis.  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  His altered mental status was felt to be due to infection versus polypharmacy.  His home gabapentin and mirtazapine were discontinued and dose of Dilantin was decreased due to elevated levels.   Presented due to elevated Dilantin levels at nursing facility with altered mental status.  Was hospitalized for further management.   Consultants: Neurology   HOSPITAL COURSE:     Phenytoin toxicity Patient presented with elevated levels at skilled nursing facility.  Dilantin was held.  Neurology was consulted.  Pharmacy was also following. Dilantin levels gradually improved and down to a corrected level of 13.4.   Seizure disorder Dilantin levels have improved.  Goal is to have a total phenytoin level between 10 to 20 mcg/mL.  Free phenytoin level should be between 1 to 2 mcg/mL.  Started on  Dilantin 150 mg every morning and 200 mg nightly.  Levels to be checked in 4 to 6 days.  Dose increases should not exceed 100 mg at a time because a small dose adjustment may result in disproportionate concentration change.     Acute encephalopathy, toxic Most likely due to Dilantin toxicity.  CT head was negative for acute findings.  Patient does have cognitive impairment at baseline due to history of traumatic brain injury. Mentation seems to be back to baseline.   Acute sinusitis Noted on CT scan. Completed course of Augmentin here in the hospital.     Essential hypertension Blood pressure was elevated due to agitation.  Seems to have improved. Continue just atenolol for now.     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 Vascular surgery notified of this patient.  Staples were removed from both of the lower extremity stumps.     History of mood disorder Continue BuSpar   Chronic pain/neuropathy Continue gabapentin.   Severe protein calorie malnutrition Nutrition Problem: Severe Malnutrition Etiology: chronic illness (TBI with cognitive impairment, PAD s/p B AKA)  Patient is stable.  Okay for discharge back to SNF today.  PERTINENT LABS:  The results of significant diagnostics from this hospitalization (including imaging, microbiology, ancillary and laboratory) are listed below for reference.    Microbiology: Recent Results (from the past 240 hour(s))  Resp panel by RT-PCR (RSV, Flu A&B, Covid) Anterior Nasal Swab     Status: None   Collection Time: 07/14/22  2:53 PM   Specimen: Anterior Nasal Swab  Result Value Ref Range Status   SARS Coronavirus 2 by RT PCR NEGATIVE NEGATIVE Final    Comment: (NOTE) SARS-CoV-2 target nucleic acids are NOT DETECTED.  The SARS-CoV-2 RNA is generally detectable in upper  respiratory specimens during the acute phase of infection. The lowest concentration of SARS-CoV-2 viral copies this assay can detect is 138 copies/mL. A negative result does not preclude SARS-Cov-2 infection and should not be used as the sole basis for treatment or other patient management decisions. A negative result may occur with  improper specimen collection/handling, submission of specimen other than nasopharyngeal swab, presence of viral mutation(s) within the areas targeted by this assay, and inadequate number of viral copies(<138 copies/mL). A negative result must be combined with clinical observations, patient history, and epidemiological information. The expected result is Negative.  Fact Sheet for Patients:  EntrepreneurPulse.com.au  Fact Sheet for Healthcare Providers:  IncredibleEmployment.be  This test is no t yet approved or cleared by the [place] FDA and  has been authorized for detection and/or diagnosis of SARS-CoV-2 by FDA under an Emergency Use Authorization (EUA). This EUA will remain  in effect (meaning this test can be used) for the duration of the COVID-19 declaration under Section 564(b)(1) of the Act, 21 U.S.C.section 360bbb-3(b)(1), unless the authorization is terminated  or revoked sooner.       Influenza A by PCR NEGATIVE NEGATIVE Final   Influenza B by PCR NEGATIVE NEGATIVE Final    Comment: (NOTE) The Xpert Xpress SARS-CoV-2/FLU/RSV plus assay is intended as an aid in the diagnosis of influenza from Nasopharyngeal swab specimens and should not be used as a sole basis for treatment. Nasal washings and aspirates are unacceptable for Xpert Xpress SARS-CoV-2/FLU/RSV testing.  Fact Sheet for Patients: EntrepreneurPulse.com.au  Fact Sheet for Healthcare Providers: IncredibleEmployment.be  This test is not yet approved or cleared by the [place] FDA and has been  authorized for detection and/or diagnosis of SARS-CoV-2 by FDA under an Emergency Use Authorization (EUA). This EUA will remain in effect (meaning this test can be used) for the duration of the COVID-19 declaration under Section 564(b)(1) of the Act, 21 U.S.C. section 360bbb-3(b)(1), unless the authorization is terminated or revoked.     Resp Syncytial Virus by PCR NEGATIVE NEGATIVE Final    Comment: (NOTE) Fact Sheet for Patients: EntrepreneurPulse.com.au  Fact Sheet for Healthcare Providers: IncredibleEmployment.be  This test is not yet approved or cleared by the [place] FDA and has been authorized for detection and/or diagnosis of SARS-CoV-2 by FDA under an Emergency Use Authorization (EUA). This EUA will remain in effect (meaning this test can be used) for the duration of the COVID-19 declaration under Section 564(b)(1) of the Act, 21 U.S.C. section 360bbb-3(b)(1), unless the authorization is terminated or revoked.  Performed at [facility], [place] [address], [place], [place] [address]   Urine Culture     Status: None   Collection Time: 07/14/22  4:47 PM   Specimen: Urine, Catheterized  Result Value Ref Range Status   Specimen Description URINE, CATHETERIZED  Final   Special Requests NONE  Final   Culture   Final    NO GROWTH Performed at [facility], [address] [place],  [address]    Report Status 07/15/2022 FINAL  Final     Labs:   Basic Metabolic Panel: Recent Labs  Lab 07/14/22 1541 07/16/22 0341 07/17/22 0641  NA 133* 133* 134*  K  3.9 3.9 4.0  CL 97* 98 97*  CO2 '28 24 25  '$ GLUCOSE 113* 99 90  BUN '20 13 13  '$ CREATININE 0.70 0.69 0.65  CALCIUM 8.6* 9.0 9.3  MG  --  1.8 1.9   Liver Function Tests: Recent Labs  Lab 07/14/22 1541 07/18/22 0228  AST 26  --   ALT 19  --   ALKPHOS 96  --   BILITOT 0.2*  --   PROT 7.3  --   ALBUMIN 2.8* 2.9*    Recent Labs  Lab 07/14/22 1541   AMMONIA 27   CBC: Recent Labs  Lab 07/14/22 1541 07/16/22 0341  WBC 9.1 8.2  NEUTROABS 4.1  --   HGB 10.6* 11.0*  HCT 35.3* 34.8*  MCV 93.4 90.4  PLT 404* 402*      IMAGING STUDIES CT Head Wo Contrast  Result Date: 07/14/2022 CLINICAL DATA:  Altered mental status. EXAM: CT HEAD WITHOUT CONTRAST TECHNIQUE: Contiguous axial images were obtained from the base of the skull through the vertex without intravenous contrast. RADIATION DOSE REDUCTION: This exam was performed according to the departmental dose-optimization program which includes automated exposure control, adjustment of the mA and/or kV according to patient size and/or use of iterative reconstruction technique. COMPARISON:  06/14/2022 FINDINGS: Brain: Ventricles, cisterns and other CSF spaces are mildly prominent but unchanged and likely due to mild atrophy. There is chronic ischemic microvascular disease. Couple small old bilateral lacunar infarcts over the basal ganglia. Old right frontal infarct. No mass, mass effect, shift of midline structures or acute hemorrhage. No evidence of acute infarction. Vascular: No hyperdense vessel or unexpected calcification. Skull: Normal. Negative for fracture or focal lesion. Sinuses/Orbits: Orbits are normal. There is moderate opacification of the sinuses compatible with extensive inflammatory change as there is a suggestion of air-fluid levels over the maxillary sinuses as findings could be seen with acute sinusitis. Other: None IMPRESSION: 1. No acute findings. 2. 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 Old right frontal infarct. 3. Moderate opacification of the sinuses compatible with extensive inflammatory change as there is a suggestion of air-fluid levels over the maxillary sinuses as findings could be seen with acute sinusitis. Electronically Signed   By: Marin Olp M.D.   On: 07/14/2022 15:43   DG Chest 2 View  Result Date: 07/14/2022 CLINICAL DATA:  Cough, congestion  EXAM: CHEST - 2 VIEW COMPARISON:  11/09/2021 FINDINGS: No focal consolidation. No pleural effusion or pneumothorax. Heart and mediastinal contours are unremarkable. Thoracic aortic stent graft noted. No acute osseous abnormality. IMPRESSION: No active cardiopulmonary disease. Electronically Signed   By: Kathreen Devoid M.D.   On: 07/14/2022 15:30   DG FEMUR 1V LEFT  Result Date: 06/19/2022 CLINICAL DATA:  Follow-up left above the knee amputation EXAM: LEFT FEMUR 1 VIEW COMPARISON:  None FINDINGS: Above the knee amputation of the left leg about 10 cm below the trochanteric region. Residual femoral hardware. No unexpected radiographic finding. IMPRESSION: Above the knee amputation of the left leg about 10 cm below the trochanteric region. Electronically Signed   By: Nelson Chimes M.D.   On: 06/19/2022 10:28    DISCHARGE EXAMINATION: Vitals:   07/18/22 1336 07/18/22 1931 07/19/22 0404 07/19/22 0837  BP: (!) 158/71 (!) 154/73 (!) 148/86 (!) 140/81  Pulse: 79 79 87 89  Resp: '17 18 18 17  '$ Temp: 98.9 F (37.2 C) 98 F (36.7 C) 98.5 F (36.9 C) 98.6 F (37 C)  TempSrc: Oral Oral Oral Oral  SpO2: 99%  99% 98% 99%  Weight:      Height:       General appearance: Awake alert.  In no distress Resp: Clear to auscultation bilaterally.  Normal effort Cardio: S1-S2 is normal regular.  No S3-S4.  No rubs murmurs or bruit GI: Abdomen is soft.  Nontender nondistended.  Bowel sounds are present normal.  No masses organomegaly   DISPOSITION: SNF  Discharge Instructions     Call MD for:  difficulty breathing, headache or visual disturbances   Complete by: As directed    Call MD for:  extreme fatigue   Complete by: As directed    Call MD for:  persistant dizziness or light-headedness   Complete by: As directed    Call MD for:  persistant nausea and vomiting   Complete by: As directed    Call MD for:  redness, tenderness, or signs of infection (pain, swelling, redness, odor or green/yellow discharge  around incision site)   Complete by: As directed    Call MD for:  temperature >100.4   Complete by: As directed    Diet general   Complete by: As directed    Discharge instructions   Complete by: As directed    Please review instructions on the discharge summary  You were cared for by a hospitalist during your hospital stay. If you have any questions about your discharge medications or the care you received while you were in the hospital after you are discharged, you can call the unit and asked to speak with the hospitalist on call if the hospitalist that took care of you is not available. Once you are discharged, your primary care physician will handle any further medical issues. Please note that NO REFILLS for any discharge medications will be authorized once you are discharged, as it is imperative that you return to your primary care physician (or establish a relationship with a primary care physician if you do not have one) for your aftercare needs so that they can reassess your need for medications and monitor your lab values. If you do not have a primary care physician, you can call [phone] for a physician referral.   Increase activity slowly   Complete by: As directed    No wound care   Complete by: As directed           Allergies as of 07/19/2022       Reactions   Isovue [iopamidol] Hives   Pt broke out in one hive on his chest after contrast on 01/03/15.  Pt will need full premeds in the future per Dr Martinique.          Medication List     TAKE these medications    acetaminophen 500 MG tablet Commonly known as: TYLENOL Take 1,000 mg by mouth daily.   ascorbic acid 500 MG tablet Commonly known as: VITAMIN C Take 500 mg by mouth daily.   aspirin EC 81 MG tablet Take 1 tablet (81 mg total) by mouth daily.   atenolol 50 MG tablet Commonly known as: TENORMIN Take 1 tablet (50 mg total) by mouth daily.   busPIRone 10 MG tablet Commonly known as: BUSPAR Take 10 mg  by mouth 3 (three) times daily.   clopidogrel 75 MG tablet Commonly known as: Plavix Take 1 tablet (75 mg total) by mouth daily.   docusate sodium 100 MG capsule Commonly known as: COLACE Take 100 mg by mouth daily.   ferrous gluconate 324 MG tablet Commonly known as:  FERGON Take 324 mg by mouth See admin instructions. 324 mg once daily on Monday, Thursday.   gabapentin 100 MG capsule Commonly known as: NEURONTIN Take 200 mg by mouth at bedtime.   GNP Vitamin D3 10 MCG (400 UNIT) Tabs tablet Generic drug: cholecalciferol Take 400 Units by mouth daily.   guaifenesin 100 MG/5ML syrup Commonly known as: ROBITUSSIN Take 100 mg by mouth 2 (two) times daily.   multivitamin with minerals tablet Take 1 tablet by mouth daily.   oxyCODONE-acetaminophen 5-325 MG tablet Commonly known as: PERCOCET/ROXICET Take 1 tablet by mouth every 4 (four) hours as needed (pain).   phenytoin 200 MG ER capsule Commonly known as: DILANTIN Take 1 capsule (200 mg total) by mouth at bedtime. What changed:  medication strength how much to take when to take this additional instructions   phenytoin 100 MG ER capsule Commonly known as: DILANTIN Take 1 capsule (100 mg total) by mouth daily. What changed: You were already taking a medication with the same name, and this prescription was added. Make sure you understand how and when to take each.   phenytoin 50 MG tablet Commonly known as: DILANTIN Chew 1 tablet (50 mg total) by mouth daily.   pravastatin 40 MG tablet Commonly known as: PRAVACHOL Take 40 mg by mouth at bedtime.   Pro-Stat Liqd Take 30 mLs by mouth 2 (two) times daily.          Contact information for after-discharge care     Destination     [facility] .   Service: Skilled Nursing Contact information: [address] [place] [phone]                     TOTAL DISCHARGE TIME: 45 minutes  [place]  [facility] Pager on [email or web address]  07/19/2022, 9:03 AM

## 2022-07-19 NOTE — Progress Notes (Signed)
[name] be D/C'd per MD order. Discussed with the patient and his sister and all questions fully answered. ? An After Visit Summary was printed and given to the patient's sister. ? Report called to nurse Marita Kansas at [facility]. All questions answered. Awaiting ambulance transfer.

## 2022-07-19 NOTE — Progress Notes (Signed)
Attempted to call report to [place]. Phone rang for 5 minutes with no answer. Will try again.

## 2022-07-23 ENCOUNTER — Ambulatory Visit (INDEPENDENT_AMBULATORY_CARE_PROVIDER_SITE_OTHER): Payer: Medicare Other | Admitting: Physician Assistant

## 2022-07-23 VITALS — BP 125/67 | HR 72 | Temp 98.2°F

## 2022-07-23 DIAGNOSIS — Z89611 Acquired absence of right leg above knee: Secondary | ICD-10-CM

## 2022-07-23 DIAGNOSIS — Z89612 Acquired absence of left leg above knee: Secondary | ICD-10-CM

## 2022-07-23 NOTE — Progress Notes (Signed)
POST OPERATIVE OFFICE NOTE    CC:  F/u for surgery  HPI:  This is a 67 y.o. 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 Scot Dock.  Pt was hospitalized last week and staples were removed from both AKA sites.      Pt returns today for follow up and here with his sister.  Pt here with his sister and he looks great! His bilateral AKA sites have healed nicely.   He is residing at [facility] and he likes his facility.    He has hx of endovascular repair of descending thoracic aortic aneurysm without left SCA coverage on 06/11/2014 by Dr. Trula Slade.     Allergies  Allergen Reactions   Isovue [Iopamidol] Hives    Pt broke out in one hive on his chest after contrast on 01/03/15.  Pt will need full premeds in the future per Dr Martinique.      Current Outpatient Medications  Medication Sig Dispense Refill   acetaminophen (TYLENOL) 500 MG tablet Take 1,000 mg by mouth daily.     Amino Acids-Protein Hydrolys (PRO-STAT) LIQD Take 30 mLs by mouth 2 (two) times daily.     ascorbic acid (VITAMIN C) 500 MG tablet Take 500 mg by mouth daily.     aspirin EC 81 MG tablet Take 1 tablet (81 mg total) by mouth daily. 150 tablet 11   atenolol (TENORMIN) 50 MG tablet Take 1 tablet (50 mg total) by mouth daily. 30 tablet 2   busPIRone (BUSPAR) 10 MG tablet Take 10 mg by mouth 3 (three) times daily.     cholecalciferol (GNP VITAMIN D3) 10 MCG (400 UNIT) TABS tablet Take 400 Units by mouth daily.     clopidogrel (PLAVIX) 75 MG tablet Take 1 tablet (75 mg total) by mouth daily. 30 tablet 11   docusate sodium (COLACE) 100 MG capsule Take 100 mg by mouth daily.     ferrous gluconate (FERGON) 324 MG tablet Take 324 mg by mouth See admin instructions. 324 mg once daily on Monday, Thursday.     gabapentin (NEURONTIN) 100 MG capsule Take 200 mg by mouth at bedtime.     guaifenesin (ROBITUSSIN) 100 MG/5ML syrup Take 100 mg by mouth 2 (two) times daily.     Multiple Vitamins-Minerals  (MULTIVITAMIN WITH MINERALS) tablet Take 1 tablet by mouth daily.     oxyCODONE-acetaminophen (PERCOCET/ROXICET) 5-325 MG tablet Take 1 tablet by mouth every 4 (four) hours as needed (pain). 20 tablet 0   phenytoin (DILANTIN) 100 MG ER capsule Take 1 capsule (100 mg total) by mouth daily.     phenytoin (DILANTIN) 200 MG ER capsule Take 1 capsule (200 mg total) by mouth at bedtime.     phenytoin (DILANTIN) 50 MG tablet Chew 1 tablet (50 mg total) by mouth daily.     pravastatin (PRAVACHOL) 40 MG tablet Take 40 mg by mouth at bedtime.     No current facility-administered medications for this visit.     ROS:  See HPI  Physical Exam:  Today's Vitals   07/23/22 1258  BP: 125/67  Pulse: 72  Temp: 98.2 F (36.8 C)  TempSrc: Temporal  SpO2: 100%   There is no height or weight on file to calculate BMI.   Incision:  bilateral AKA sites look good and healing nicely.       Assessment/Plan:  This is a 67 y.o. male who is s/p: revision of right AKA and left AKA on 06/19/2022  by Dr. Scot Dock.   He has hx of endovascular repair of descending thoracic aortic aneurysm without left SCA coverage on 06/11/2014 by Dr. Trula Slade.    -bilateral AKA sites healing nicely.  Staples were removed last week while he was hospitalized. -will have pt f/u in 6 months with CTA chest to evaluate TEVAR repair and see Dr. Trula Slade -they will call sooner if any issues before then.   [name], [facility] [phone]   Clinic MD:  Trula Slade

## 2022-07-25 ENCOUNTER — Emergency Department (HOSPITAL_COMMUNITY): Payer: Medicare Other

## 2022-07-25 ENCOUNTER — Ambulatory Visit: Payer: Medicaid Other | Admitting: Podiatry

## 2022-07-25 ENCOUNTER — Other Ambulatory Visit: Payer: Self-pay

## 2022-07-25 ENCOUNTER — Encounter (HOSPITAL_COMMUNITY): Payer: Self-pay

## 2022-07-25 ENCOUNTER — Emergency Department (HOSPITAL_COMMUNITY)
Admission: EM | Admit: 2022-07-25 | Discharge: 2022-07-25 | Disposition: A | Discharge status: Skilled Nursing Facility | Payer: Medicare Other | Attending: Emergency Medicine | Admitting: Emergency Medicine

## 2022-07-25 DIAGNOSIS — Z89611 Acquired absence of right leg above knee: Secondary | ICD-10-CM | POA: Insufficient documentation

## 2022-07-25 DIAGNOSIS — Z7982 Long term (current) use of aspirin: Secondary | ICD-10-CM | POA: Insufficient documentation

## 2022-07-25 DIAGNOSIS — R197 Diarrhea, unspecified: Secondary | ICD-10-CM | POA: Insufficient documentation

## 2022-07-25 DIAGNOSIS — R339 Retention of urine, unspecified: Secondary | ICD-10-CM | POA: Diagnosis not present

## 2022-07-25 DIAGNOSIS — I7123 Aneurysm of the descending thoracic aorta, without rupture: Secondary | ICD-10-CM | POA: Diagnosis not present

## 2022-07-25 DIAGNOSIS — A0472 Enterocolitis due to Clostridium difficile, not specified as recurrent: Secondary | ICD-10-CM

## 2022-07-25 DIAGNOSIS — Z89612 Acquired absence of left leg above knee: Secondary | ICD-10-CM | POA: Diagnosis not present

## 2022-07-25 DIAGNOSIS — I10 Essential (primary) hypertension: Secondary | ICD-10-CM | POA: Insufficient documentation

## 2022-07-25 DIAGNOSIS — Z7902 Long term (current) use of antithrombotics/antiplatelets: Secondary | ICD-10-CM | POA: Insufficient documentation

## 2022-07-25 LAB — CBC WITH DIFFERENTIAL/PLATELET
Abs Immature Granulocytes: 0.04 10*3/uL (ref 0.00–0.07)
Basophils Absolute: 0 10*3/uL (ref 0.0–0.1)
Basophils Relative: 1 %
Eosinophils Absolute: 0.2 10*3/uL (ref 0.0–0.5)
Eosinophils Relative: 3 %
HCT: 34.1 % — ABNORMAL LOW (ref 39.0–52.0)
Hemoglobin: 10.5 g/dL — ABNORMAL LOW (ref 13.0–17.0)
Immature Granulocytes: 1 %
Lymphocytes Relative: 47 %
Lymphs Abs: 3.4 10*3/uL (ref 0.7–4.0)
MCH: 28.5 pg (ref 26.0–34.0)
MCHC: 30.8 g/dL (ref 30.0–36.0)
MCV: 92.7 fL (ref 80.0–100.0)
Monocytes Absolute: 0.7 10*3/uL (ref 0.1–1.0)
Monocytes Relative: 10 %
Neutro Abs: 2.8 10*3/uL (ref 1.7–7.7)
Neutrophils Relative %: 38 %
Platelets: 405 10*3/uL — ABNORMAL HIGH (ref 150–400)
RBC: 3.68 MIL/uL — ABNORMAL LOW (ref 4.22–5.81)
RDW: 14.2 % (ref 11.5–15.5)
WBC: 7.2 10*3/uL (ref 4.0–10.5)
nRBC: 0 % (ref 0.0–0.2)

## 2022-07-25 LAB — URINALYSIS, ROUTINE W REFLEX MICROSCOPIC
Bacteria, UA: NONE SEEN
Bilirubin Urine: NEGATIVE
Glucose, UA: NEGATIVE mg/dL
Ketones, ur: NEGATIVE mg/dL
Nitrite: NEGATIVE
Protein, ur: 30 mg/dL — AB
RBC / HPF: >50 RBC/hpf (ref 0–5)
Specific Gravity, Urine: 1.024 (ref 1.005–1.030)
pH: 6 (ref 5.0–8.0)

## 2022-07-25 LAB — VALPROIC ACID LEVEL: Valproic Acid Lvl: <10 ug/mL — ABNORMAL LOW (ref 50.0–100.0)

## 2022-07-25 LAB — COMPREHENSIVE METABOLIC PANEL
ALT: 21 U/L (ref 0–44)
AST: 25 U/L (ref 15–41)
Albumin: 3.1 g/dL — ABNORMAL LOW (ref 3.5–5.0)
Alkaline Phosphatase: 91 U/L (ref 38–126)
Anion gap: 11 (ref 5–15)
BUN: 30 mg/dL — ABNORMAL HIGH (ref 8–23)
CO2: 25 mmol/L (ref 22–32)
Calcium: 8.6 mg/dL — ABNORMAL LOW (ref 8.9–10.3)
Chloride: 99 mmol/L (ref 98–111)
Creatinine, Ser: 0.6 mg/dL — ABNORMAL LOW (ref 0.61–1.24)
GFR, Estimated: >60 mL/min (ref >60)
Glucose, Bld: 101 mg/dL — ABNORMAL HIGH (ref 70–99)
Potassium: 3.8 mmol/L (ref 3.5–5.1)
Sodium: 135 mmol/L (ref 135–145)
Total Bilirubin: 0.2 mg/dL — ABNORMAL LOW (ref 0.3–1.2)
Total Protein: 7.1 g/dL (ref 6.5–8.1)

## 2022-07-25 LAB — C DIFFICILE QUICK SCREEN W PCR REFLEX
C Diff antigen: POSITIVE — AB
C Diff interpretation: DETECTED
C Diff toxin: POSITIVE — AB

## 2022-07-25 LAB — LACTIC ACID, PLASMA: Lactic Acid, Venous: 1.4 mmol/L (ref 0.5–1.9)

## 2022-07-25 LAB — PHENYTOIN LEVEL, TOTAL: Phenytoin Lvl: 14.3 ug/mL (ref 10.0–20.0)

## 2022-07-25 MED ORDER — TAMSULOSIN HCL 0.4 MG PO CAPS: 0.4000 mg | ORAL_CAPSULE | Freq: Every day | ORAL | Status: DC

## 2022-07-25 MED ORDER — VANCOMYCIN HCL 125 MG PO CAPS: 125.0000 mg | ORAL_CAPSULE | Freq: Four times a day (QID) | ORAL | 0 refills | Status: AC

## 2022-07-25 MED ORDER — LIDOCAINE-EPINEPHRINE-TETRACAINE (LET) TOPICAL GEL
3.0000 mL | Freq: Once | TOPICAL | Status: AC
  Administered 2022-07-25: 3 mL via TOPICAL
  Filled 2022-07-25: qty 3

## 2022-07-25 MED ORDER — TAMSULOSIN HCL 0.4 MG PO CAPS: 0.4000 mg | ORAL_CAPSULE | Freq: Every day | ORAL | 0 refills | Status: AC

## 2022-07-25 MED ORDER — VANCOMYCIN HCL 125 MG PO CAPS
125.0000 mg | ORAL_CAPSULE | Freq: Four times a day (QID) | ORAL | Status: DC
  Administered 2022-07-25: 125 mg via ORAL
  Filled 2022-07-25: qty 1

## 2022-07-25 NOTE — ED Notes (Signed)
Patient in room with foley team for coude cath placement. LET gel at bedside.

## 2022-07-25 NOTE — ED Notes (Signed)
Condom catheter place on patient for urine specimen.

## 2022-07-25 NOTE — ED Provider Notes (Signed)
[facility] Provider Note   CSN: [id number] Arrival date & time: 07/25/22  1312     History  No chief complaint on file.   [name] is a 67 y.o. male.  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  Admitted last month 12/21-12/29 for chronic left heel wound with osteomyelitis.  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  Dose was gradually decreased but he is being sent in from [facility] today after they recheck the levels and they were concerned that his Dilantin levels were too elevated.  Family asked that patient be sent here for further evaluation.  Patient has a history of dementia but currently reports he is just feeling just fine.  He denies pain, n/v or other symptoms.  7 days ago dilatin was 9        Home Medications Prior to Admission medications   Medication Sig Start Date End Date Taking? Authorizing Provider  acetaminophen (TYLENOL) 500 MG tablet Take 1,000 mg by mouth daily.    [provider]  Amino Acids-Protein Hydrolys (PRO-STAT) LIQD Take 30 mLs by mouth 2 (two) times daily.    [provider]  ascorbic acid (VITAMIN C) 500 MG tablet Take 500 mg by mouth daily.    [provider]  aspirin EC 81 MG tablet Take 1 tablet (81 mg total) by mouth daily. 08/17/21   Ulyses Amor, PA-C  atenolol (TENORMIN) 50 MG tablet Take 1 tablet (50 mg total) by mouth daily. 06/24/21   Oswald Hillock, MD  busPIRone (BUSPAR) 10 MG tablet Take 10 mg by mouth 3 (three) times daily.    [provider]  cholecalciferol (GNP VITAMIN D3) 10 MCG (400  UNIT) TABS tablet Take 400 Units by mouth daily.    [provider]  clopidogrel (PLAVIX) 75 MG tablet Take 1 tablet (75 mg total) by mouth daily. 08/17/21   Ulyses Amor, PA-C  docusate sodium (COLACE) 100 MG capsule Take 100 mg by mouth daily.    [provider]  ferrous gluconate (FERGON) 324 MG tablet Take 324 mg by mouth See admin instructions. 324 mg once daily on Monday, Thursday.    [provider]  gabapentin (NEURONTIN) 100 MG capsule Take 200 mg by mouth at bedtime. Patient not taking: Reported on 07/23/2022    [provider]  guaifenesin (ROBITUSSIN) 100 MG/5ML syrup Take 100 mg by mouth 2 (two) times daily.    [provider]  Multiple Vitamins-Minerals (MULTIVITAMIN WITH MINERALS) tablet Take 1 tablet by mouth daily.    [provider]  oxyCODONE-acetaminophen (PERCOCET/ROXICET) 5-325 MG tablet Take 1 tablet by mouth every 4 (four) hours as needed (pain). 07/18/22   Bonnielee Haff, MD  phenytoin (DILANTIN) 100 MG ER capsule Take 1 capsule (100 mg total) by mouth daily. 07/19/22   Bonnielee Haff, MD  phenytoin (DILANTIN) 200 MG ER capsule Take 1 capsule (200 mg total) by mouth at bedtime. 07/19/22   Bonnielee Haff, MD  phenytoin (DILANTIN) 50 MG tablet Chew 1 tablet (50 mg total) by mouth daily. Patient not taking: Reported on 07/23/2022 07/19/22  Bonnielee Haff, MD  pravastatin (PRAVACHOL) 40 MG tablet Take 40 mg by mouth at bedtime.    [provider]      Allergies    Isovue [iopamidol]    Review of Systems   Review of Systems  Physical Exam Updated Vital Signs SpO2 99%  Physical Exam Vitals and nursing note reviewed.  Constitutional:      General: He is not in acute distress.    Appearance: He is well-developed.  HENT:     Head: Normocephalic and atraumatic.  Eyes:     Conjunctiva/sclera: Conjunctivae normal.     Pupils: Pupils are equal, round, and reactive to light.  Cardiovascular:     Rate and  Rhythm: Normal rate and regular rhythm.     Heart sounds: No murmur heard. Pulmonary:     Effort: Pulmonary effort is normal. No respiratory distress.     Breath sounds: Normal breath sounds. No wheezing or rales.  Abdominal:     General: There is no distension.     Palpations: Abdomen is soft.     Tenderness: There is no abdominal tenderness. There is no guarding or rebound.  Musculoskeletal:        General: No tenderness. Normal range of motion.     Cervical back: Normal range of motion and neck supple.     Comments: Bilateral AKA  Skin:    General: Skin is warm and dry.     Findings: No erythema or rash.  Neurological:     Mental Status: He is alert and oriented to person, place, and time.     Comments: Awake and alert.  Moving all ext.  Orietned x2  Psychiatric:        Mood and Affect: Mood normal.        Behavior: Behavior normal.     ED Results / Procedures / Treatments   Labs (all labs ordered are listed, but only abnormal results are displayed) Labs Reviewed - No data to display  EKG None  Radiology No results found.  Procedures Procedures    Medications Ordered in ED Medications - No data to display  ED Course/ Medical Decision Making/ A&P                             Medical Decision Making Amount and/or Complexity of Data Reviewed Labs: ordered.   Pt with multiple medical problems and comorbidities and presenting today with a complaint that caries a high risk for morbidity and mortality.  Here today from his nursing facility due to reporting that his Dilantin level was too high.  Patient has no complaints today.  Last Dilantin level was checked 7 days ago and at that time it was mildly low at 9.  Will recheck today.  Unclear what it was at the nursing facility is not present in the chart.  3:34 PM On repeat evaluation patient's only member is now present and reports that there is been a little bit more going on today.  He reported at the facility that  he just did not feel quite right and was noted to have an episode of diarrhea.  She also says he has not really urinated since 8 AM this morning.  They tried to do an In-N-Out cath at the facility but after 3 tries they were not able to get anything and that is why they sent him here.  Patient states he thinks he has to urinate now  however was only able to put out a very small amount of urine and on bedside ultrasound he still has a significant amount of urine in his bladder.  He did have 1 episode of diarrhea here.  However this is now 2 episodes in the last 12 hours.  He was on antibiotics approximately a week ago he finished amoxicillin for a sinus infection.  This does make him at higher risk for C. difficile but no prior history of C. difficile.  He has no abdominal pain at this time.  Will give patient something to drink and see if he is able to urinate.  I independently interpreted patient's labs today and CBC with stable anemia but no other acute findings, phenytoin level today is normal at 14 and CMP without acute changes, lactic acid within normal limits and UA is pending.  Will check outpatient to Dr. Tinnie Gens who can follow-up on urine and see if patient is able to urinate or needs a Foley catheter.  All of this was discussed with his family member who is present at bedside.  Patient has otherwise prior to today had been stooling normally and low suspicion for fecal impaction.         Final Clinical Impression(s) / ED Diagnoses Final diagnoses:  None    Rx / DC Orders ED Discharge Orders     None         Blanchie Dessert, MD 07/25/22 1536

## 2022-07-25 NOTE — ED Provider Notes (Signed)
  Physical Exam  BP (!) 147/87 (BP Location: Right Arm)   Pulse 83   Temp 98.8 F (37.1 C) (Oral)   Resp 18   SpO2 99%   Physical Exam Constitutional:      General: He is not in acute distress.    Appearance: Normal appearance.  HENT:     Head: Normocephalic and atraumatic.     Nose: No congestion or rhinorrhea.  Eyes:     General:        Right eye: No discharge.        Left eye: No discharge.     Extraocular Movements: Extraocular movements intact.     Pupils: Pupils are equal, round, and reactive to light.  Cardiovascular:     Rate and Rhythm: Normal rate and regular rhythm.     Heart sounds: No murmur heard. Pulmonary:     Effort: No respiratory distress.     Breath sounds: No wheezing or rales.  Abdominal:     General: There is no distension.     Tenderness: There is no abdominal tenderness.  Musculoskeletal:        General: Normal range of motion.     Cervical back: Normal range of motion.  Skin:    General: Skin is warm and dry.  Neurological:     General: No focal deficit present.     Mental Status: He is alert.     Procedures  Procedures  ED Course / MDM    Medical Decision Making Amount and/or Complexity of Data Reviewed Labs: ordered. Radiology: ordered.  Risk Prescription drug management.   Patient received an handoff.  Urinary retention and diarrhea pending urinalysis at time of signout.  Urinalysis showing hematuria likely traumatic from previous Foley catheter placement attempts but no evidence of infection.  Patient was able to urinate with a condom catheter on but I am still seeing a PVR of approximately 400 cc on bedside ultrasound and thus a CT stone study was obtained to rule out hydronephrosis and bladder outlet obstruction that shows a thick-walled bladder concerning for either infection or bladder outlet obstruction.  With normal UA, lower suspicion for infection.  CT states that the bladder is not distended but there is clearly more than  250 cc of retained urine in the bladder and patient would benefit from relief of his bladder outlet obstruction with temporary catheterization, Flomax and outpatient follow-up.  Of note, we did send stool studies on the patient for his persistent diarrhea and he was found to be C. difficile positive for both antigen and toxin.  He was started on oral vancomycin and as he is hemodynamically stable and overall asymptomatic from an abdominal pain standpoint he is safe for discharged with a 10-day course of oral Vanco.  He will require outpatient follow-up with urology for removal of his Foley catheter and patient discharged       Teressa Lower, MD 07/26/22 1253

## 2022-07-25 NOTE — ED Triage Notes (Signed)
Pt BIB [facility] from [facility] after facility was concerned for elevated Dilantin levels and family asked patient to be sent out. Staff also wanted UA and were unable to get one. Patient reports that he feels "a little off". Hx of dementia. VSS.

## 2022-07-25 NOTE — ED Notes (Signed)
Patient's sister reports that the facility attempted 3 in and out catheterizations without success today around 12pm. Patient had been changed this morning at 7am with urine and 8am with diarrhea and has had no output since then.

## 2022-07-26 LAB — GASTROINTESTINAL PANEL BY PCR, STOOL (REPLACES STOOL CULTURE)

## 2022-08-07 ENCOUNTER — Encounter: Payer: Self-pay | Admitting: Neurology

## 2022-08-07 ENCOUNTER — Ambulatory Visit (INDEPENDENT_AMBULATORY_CARE_PROVIDER_SITE_OTHER): Payer: Medicare Other | Admitting: Neurology

## 2022-08-07 VITALS — BP 124/67 | HR 77 | Ht 66.0 in | Wt 78.0 lb

## 2022-08-07 DIAGNOSIS — G40909 Epilepsy, unspecified, not intractable, without status epilepticus: Secondary | ICD-10-CM | POA: Diagnosis not present

## 2022-08-07 DIAGNOSIS — R4189 Other symptoms and signs involving cognitive functions and awareness: Secondary | ICD-10-CM

## 2022-08-07 DIAGNOSIS — Z5181 Encounter for therapeutic drug level monitoring: Secondary | ICD-10-CM | POA: Diagnosis not present

## 2022-08-07 DIAGNOSIS — S069X9S Unspecified intracranial injury with loss of consciousness of unspecified duration, sequela: Secondary | ICD-10-CM

## 2022-08-07 NOTE — Patient Instructions (Signed)
Continue with Dilantin 150 mg in the morning and 200 mg in the evening  We will check a Dilantin level today  Please do not adjust the Dilantin dose. If you have any questions please call us at [phone].  Continue your other medications  Follow up in one year or sooner if worse
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
## 2022-08-10 LAB — ATN PROFILE
A -- Beta-amyloid 42/40 Ratio: 0.108 (ref >0.102)
Beta-amyloid 40: 275.04 pg/mL
Beta-amyloid 42: 29.64 pg/mL
N -- NfL, Plasma: 10.7 pg/mL — ABNORMAL HIGH (ref 0.00–4.61)
T -- p-tau181: 0.92 pg/mL (ref 0.00–0.97)

## 2022-08-10 LAB — PHENYTOIN LEVEL, TOTAL: Phenytoin (Dilantin), Serum: 4.9 ug/mL — ABNORMAL LOW (ref 10.0–20.0)

## 2022-08-10 NOTE — Progress Notes (Signed)
Spoke with sister [name], inform her that ATN results were not consistent with Alzheimer dementia and his phenytoin level was low but no need to increase the dose.  Follow up as indicated.   Dr. April Manson

## 2022-08-14 ENCOUNTER — Emergency Department (HOSPITAL_COMMUNITY)
Admission: EM | Admit: 2022-08-14 | Discharge: 2022-08-15 | Disposition: A | Discharge status: Home / Self Care | Payer: Medicare Other | Attending: Emergency Medicine | Admitting: Emergency Medicine

## 2022-08-14 ENCOUNTER — Encounter (HOSPITAL_COMMUNITY): Payer: Self-pay | Admitting: Emergency Medicine

## 2022-08-14 ENCOUNTER — Other Ambulatory Visit: Payer: Self-pay

## 2022-08-14 DIAGNOSIS — D72829 Elevated white blood cell count, unspecified: Secondary | ICD-10-CM | POA: Insufficient documentation

## 2022-08-14 DIAGNOSIS — I10 Essential (primary) hypertension: Secondary | ICD-10-CM | POA: Diagnosis not present

## 2022-08-14 DIAGNOSIS — F989 Unspecified behavioral and emotional disorders with onset usually occurring in childhood and adolescence: Secondary | ICD-10-CM | POA: Insufficient documentation

## 2022-08-14 DIAGNOSIS — R4689 Other symptoms and signs involving appearance and behavior: Secondary | ICD-10-CM

## 2022-08-14 DIAGNOSIS — Z7982 Long term (current) use of aspirin: Secondary | ICD-10-CM | POA: Insufficient documentation

## 2022-08-14 DIAGNOSIS — Z7902 Long term (current) use of antithrombotics/antiplatelets: Secondary | ICD-10-CM | POA: Insufficient documentation

## 2022-08-14 DIAGNOSIS — R45851 Suicidal ideations: Secondary | ICD-10-CM | POA: Insufficient documentation

## 2022-08-14 DIAGNOSIS — Z79899 Other long term (current) drug therapy: Secondary | ICD-10-CM | POA: Diagnosis not present

## 2022-08-14 LAB — CBC WITH DIFFERENTIAL/PLATELET
Abs Immature Granulocytes: 0.03 10*3/uL (ref 0.00–0.07)
Basophils Absolute: 0.1 10*3/uL (ref 0.0–0.1)
Basophils Relative: 1 %
Eosinophils Absolute: 0.5 10*3/uL (ref 0.0–0.5)
Eosinophils Relative: 6 %
HCT: 32.8 % — ABNORMAL LOW (ref 39.0–52.0)
Hemoglobin: 9.9 g/dL — ABNORMAL LOW (ref 13.0–17.0)
Immature Granulocytes: 0 %
Lymphocytes Relative: 39 %
Lymphs Abs: 3.3 10*3/uL (ref 0.7–4.0)
MCH: 28 pg (ref 26.0–34.0)
MCHC: 30.2 g/dL (ref 30.0–36.0)
MCV: 92.9 fL (ref 80.0–100.0)
Monocytes Absolute: 0.9 10*3/uL (ref 0.1–1.0)
Monocytes Relative: 10 %
Neutro Abs: 3.8 10*3/uL (ref 1.7–7.7)
Neutrophils Relative %: 44 %
Platelets: 616 10*3/uL — ABNORMAL HIGH (ref 150–400)
RBC: 3.53 MIL/uL — ABNORMAL LOW (ref 4.22–5.81)
RDW: 14.2 % (ref 11.5–15.5)
WBC: 8.5 10*3/uL (ref 4.0–10.5)
nRBC: 0 % (ref 0.0–0.2)

## 2022-08-14 LAB — RAPID URINE DRUG SCREEN, HOSP PERFORMED
Amphetamines: NOT DETECTED
Barbiturates: NOT DETECTED
Benzodiazepines: NOT DETECTED
Cocaine: NOT DETECTED
Opiates: NOT DETECTED
Tetrahydrocannabinol: NOT DETECTED

## 2022-08-14 LAB — COMPREHENSIVE METABOLIC PANEL
ALT: 22 U/L (ref 0–44)
AST: 21 U/L (ref 15–41)
Albumin: 2.6 g/dL — ABNORMAL LOW (ref 3.5–5.0)
Alkaline Phosphatase: 88 U/L (ref 38–126)
Anion gap: 5 (ref 5–15)
BUN: 12 mg/dL (ref 8–23)
CO2: 27 mmol/L (ref 22–32)
Calcium: 8.4 mg/dL — ABNORMAL LOW (ref 8.9–10.3)
Chloride: 105 mmol/L (ref 98–111)
Creatinine, Ser: 0.56 mg/dL — ABNORMAL LOW (ref 0.61–1.24)
GFR, Estimated: >60 mL/min (ref >60)
Glucose, Bld: 98 mg/dL (ref 70–99)
Potassium: 3.4 mmol/L — ABNORMAL LOW (ref 3.5–5.1)
Sodium: 137 mmol/L (ref 135–145)
Total Bilirubin: 0.2 mg/dL — ABNORMAL LOW (ref 0.3–1.2)
Total Protein: 6.7 g/dL (ref 6.5–8.1)

## 2022-08-14 NOTE — Discharge Instructions (Signed)
Your workup today was reassuring.  Given you have not had any thoughts of hurting yourself, hurting others or plan to hurt yourself I do not think you need to see a psychiatrist today.  If this becomes a regular problem you will need to see a behavioral health doctor or return to the ED if you are having active thoughts of self-harm.  If he has any fevers, confusion, change in mental baseline, new or concerning symptoms return to the ED.  Continue taking the Keflex for the UTI, continue following up outpatient as needed with the urologist if hematuria and urinary symptoms persist.  Of note, patient's platelet count was slightly elevated today.  Not a specific finding but something to trend if this stays persistent in the outpatient setting.

## 2022-08-14 NOTE — ED Provider Notes (Signed)
[facility] Provider Note   CSN: [facility]:[id number] Arrival date & time: 08/14/22  1920     History  Chief Complaint  Patient presents with   Psychiatric Evaluation    [name] is a 67 y.o. male.  HPI   Patient is a 67 year old male with medical history of TBI, memory deficit, hypertension, hyperlipidemia, epilepsy, bilateral AKA presenting to the emergency department due to suicidal ideations.  History is provided by the patient, his Darvin Neighbours and by the nursing home he resides as well as external chart review.  Patient 2 days ago was diagnosed with a UTI and started on Keflex.  At baseline he is pleasantly confused and per nursing home staff he has been at his typical baseline.  Earlier today he had an outburst that is very atypical for him.  He stated that he wanted to end his own life, states he was going to jump off a bridge or overdose because he did not see the point in living anymore.  Nursing home staff consulted MD who advised evaluation of the emergency department for psychiatric workup.  Patient does not have any history of SI, there have been no other changes in medicine other than the Keflex.  Patient has short-term memory loss at baseline, with me he denies any feelings of SI, he does not remember the events happening earlier today.  Home Medications Prior to Admission medications   Medication Sig Start Date End Date Taking? Authorizing Provider  acetaminophen (TYLENOL) 500 MG tablet Take 1,000 mg by mouth daily.    [provider]  Amino Acids-Protein Hydrolys (PRO-STAT) LIQD Take 30 mLs by mouth 2 (two) times daily.    [provider]  ascorbic acid (VITAMIN C) 500 MG tablet Take 500 mg by mouth daily.    [provider]  aspirin EC 81 MG tablet Take 1 tablet (81 mg total) by mouth daily. 08/17/21   Ulyses Amor, PA-C  atenolol (TENORMIN) 50 MG tablet Take 1 tablet (50 mg total) by mouth  daily. 06/24/21   Oswald Hillock, MD  busPIRone (BUSPAR) 10 MG tablet Take 10 mg by mouth 3 (three) times daily.    [provider]  cholecalciferol (GNP VITAMIN D3) 10 MCG (400 UNIT) TABS tablet Take 400 Units by mouth daily.    [provider]  clopidogrel (PLAVIX) 75 MG tablet Take 1 tablet (75 mg total) by mouth daily. 08/17/21   Ulyses Amor, PA-C  docusate sodium (COLACE) 100 MG capsule Take 100 mg by mouth daily.    [provider]  ferrous gluconate (FERGON) 324 MG tablet Take 324 mg by mouth See admin instructions. 324 mg once daily on Monday, Thursday.    [provider]  gabapentin (NEURONTIN) 100 MG capsule Take 200 mg by mouth at bedtime.    [provider]  guaifenesin (ROBITUSSIN) 100 MG/5ML syrup Take 100 mg by mouth 2 (two) times daily.    [provider]  Multiple Vitamins-Minerals (MULTIVITAMIN WITH MINERALS) tablet Take 1 tablet by mouth daily.    [provider]  oxyCODONE-acetaminophen (PERCOCET/ROXICET) 5-325 MG tablet Take 1 tablet by mouth every 4 (four) hours as needed (pain). 07/18/22   Bonnielee Haff, MD  phenytoin (DILANTIN) 100 MG ER capsule Take 1 capsule (100 mg total) by mouth daily. 07/19/22   Bonnielee Haff, MD  phenytoin (DILANTIN) 200 MG ER capsule Take 1 capsule (200 mg total) by mouth at bedtime. 07/19/22  Bonnielee Haff, MD  phenytoin (DILANTIN) 50 MG tablet Chew 1 tablet (50 mg total) by mouth daily. 07/19/22   Bonnielee Haff, MD  pravastatin (PRAVACHOL) 40 MG tablet Take 40 mg by mouth at bedtime.    [provider]  tamsulosin (FLOMAX) 0.4 MG CAPS capsule Take 1 capsule (0.4 mg total) by mouth daily. 07/25/22 08/24/22  Kommor, Debe Coder, MD      Allergies    Isovue [iopamidol]    Review of Systems   Review of Systems  Physical Exam Updated Vital Signs BP (!) 140/57 (BP Location: Right Arm)   Pulse 78   Temp 98.8 F (37.1 C)   Resp 16   SpO2 100%  Physical Exam Vitals and  nursing note reviewed. Exam conducted with a chaperone present.  Constitutional:      Appearance: Normal appearance.  HENT:     Head: Normocephalic and atraumatic.  Eyes:     General: No scleral icterus.       Right eye: No discharge.        Left eye: No discharge.     Extraocular Movements: Extraocular movements intact.     Pupils: Pupils are equal, round, and reactive to light.  Cardiovascular:     Rate and Rhythm: Normal rate and regular rhythm.     Pulses: Normal pulses.     Heart sounds: Normal heart sounds. No murmur heard.    No friction rub. No gallop.  Pulmonary:     Effort: Pulmonary effort is normal. No respiratory distress.     Breath sounds: Normal breath sounds.  Abdominal:     General: Abdomen is flat. Bowel sounds are normal. There is no distension.     Palpations: Abdomen is soft.     Tenderness: There is no abdominal tenderness.  Skin:    General: Skin is warm and dry.     Coloration: Skin is not jaundiced.  Neurological:     Mental Status: He is alert. Mental status is at baseline.     Coordination: Coordination normal.     Comments: Baseline, pleasantly confused  Psychiatric:     Comments: Patient adamantly denies any HI or SI.     ED Results / Procedures / Treatments   Labs (all labs ordered are listed, but only abnormal results are displayed) Labs Reviewed  COMPREHENSIVE METABOLIC PANEL - Abnormal; Notable for the following components:      Result Value   Potassium 3.4 (*)    Creatinine, Ser 0.56 (*)    Calcium 8.4 (*)    Albumin 2.6 (*)    Total Bilirubin 0.2 (*)    All other components within normal limits  CBC WITH DIFFERENTIAL/PLATELET - Abnormal; Notable for the following components:   RBC 3.53 (*)    Hemoglobin 9.9 (*)    HCT 32.8 (*)    Platelets 616 (*)    All other components within normal limits  ETHANOL  RAPID URINE DRUG SCREEN, HOSP PERFORMED  PHENYTOIN LEVEL, FREE AND TOTAL    EKG None  Radiology No results  found.  Procedures Procedures    Medications Ordered in ED Medications - No data to display  ED Course/ Medical Decision Making/ A&P Clinical Course as of 08/14/22 2308  Tue Aug 14, 2022  2229 Phenytoin level, free and total [HS]    Clinical Course User Index [HS] Sherrill Raring, PA-C  Medical Decision Making Amount and/or Complexity of Data Reviewed Labs: ordered. Decision-making details documented in ED Course.   Case presents to the emergency department due to [place] at care home.  On my evaluation he is adamantly denying any SI, Chrismon complicated by TBI and short-term memory issues.  I reviewed external medical records, patient's Sister [name] and Network engineer working at [employer] provided independent history as documented in the HPI.  I consider psychiatric evaluation but patient is not having any active SI or plan, does not wish to end own life. He does not appear encephalopathic, he is at his mental baseline per nursing home, sister and per patient.  He is not on no new medications other than the Keflex and is not septic.  I will check some basic labs, observe and reevaluate.  There is no leukocytosis, patient is anemic but baseline per chart review.  Slightly elevated platelet count.  No gross electrolyte derangement.  Phenytoin level collected and pending.  I discussed the workup with the patient's sister.  Given patient has not at his mental baseline, encephalopathic, not septic and denies adamantly any SI or HI I do not think he needs additional workup or psychiatric evaluation.  Will discharge back to home.        Final Clinical Impression(s) / ED Diagnoses Final diagnoses:  Behavioral change    Rx / DC Orders ED Discharge Orders     None         Sherrill Raring, Hershal Coria 08/14/22 2308    Blanchie Dessert, MD 08/15/22 2122

## 2022-08-14 NOTE — ED Triage Notes (Signed)
Pt BIB EMS from [facility] with c/o suicidal ideation x 6 months. Pt is bilateral AKA and has hx of a tbi.

## 2022-08-15 LAB — URINALYSIS, ROUTINE W REFLEX MICROSCOPIC
Bilirubin Urine: NEGATIVE
Glucose, UA: NEGATIVE mg/dL
Ketones, ur: NEGATIVE mg/dL
Nitrite: NEGATIVE
Protein, ur: 100 mg/dL — AB
RBC / HPF: >50 RBC/hpf (ref 0–5)
Specific Gravity, Urine: 1.021 (ref 1.005–1.030)
WBC, UA: >50 WBC/hpf (ref 0–5)
pH: 5 (ref 5.0–8.0)

## 2022-08-20 ENCOUNTER — Encounter: Payer: Self-pay | Admitting: Pulmonary Disease

## 2022-08-20 ENCOUNTER — Ambulatory Visit (INDEPENDENT_AMBULATORY_CARE_PROVIDER_SITE_OTHER): Payer: Medicare Other | Admitting: Pulmonary Disease

## 2022-08-20 VITALS — BP 126/72 | HR 75 | Ht <= 58 in | Wt 75.2 lb

## 2022-08-20 DIAGNOSIS — Z789 Other specified health status: Secondary | ICD-10-CM

## 2022-08-20 DIAGNOSIS — F79 Unspecified intellectual disabilities: Secondary | ICD-10-CM | POA: Diagnosis not present

## 2022-08-20 DIAGNOSIS — R911 Solitary pulmonary nodule: Secondary | ICD-10-CM

## 2022-08-20 DIAGNOSIS — Z87891 Personal history of nicotine dependence: Secondary | ICD-10-CM | POA: Diagnosis not present

## 2022-08-20 NOTE — Progress Notes (Signed)
  Interdisciplinary Goals of Care Family Meeting   Date carried out: 08/20/2022  Location of the meeting: Bedside  Member's involved: Physician, patient's healthcare power of attorney and patient  Durable Power of Attorney or acting medical decision maker: Patient's sister, healthcare power of attorney  Discussion: We discussed goals of care for [name] .  We discussed his multiple medical comorbidities.  Recent severe peripheral vascular disease requiring AKA.  Currently residing in a full care facility.  Also suffering from baseline cognitive decline.  Encouraged ongoing discussion with palliative care regarding CODE STATUS.  We briefly touched on this today.  She is going to continue to talk to him and family regarding this.  And is open for ongoing communication.  Code status: Full code  Disposition: SNF/LTAC  Time spent for the meeting: 16 mins   Garner Nash, DO  08/20/2022, 1:52 PM
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
## 2022-08-20 NOTE — Patient Instructions (Signed)
Thank you for visiting Dr. Valeta Harms at [facility]. Today we recommend the following:  Orders Placed This Encounter  Procedures   Amb Referral to Palliative Care    Return in about 1 year (around 08/21/2023), or if symptoms worsen or fail to improve, for with APP or Dr. Valeta Harms.    Please do your part to reduce the spread of COVID-19.

## 2022-08-23 ENCOUNTER — Encounter: Payer: Self-pay | Admitting: Internal Medicine

## 2022-09-05 ENCOUNTER — Non-Acute Institutional Stay: Payer: Medicare Other | Admitting: Hospice

## 2022-09-05 DIAGNOSIS — R569 Unspecified convulsions: Secondary | ICD-10-CM

## 2022-09-05 DIAGNOSIS — F32A Depression, unspecified: Secondary | ICD-10-CM

## 2022-09-05 DIAGNOSIS — Z515 Encounter for palliative care: Secondary | ICD-10-CM

## 2022-09-05 DIAGNOSIS — R4189 Other symptoms and signs involving cognitive functions and awareness: Secondary | ICD-10-CM

## 2022-09-05 NOTE — Progress Notes (Signed)
[facility] Consult Note Telephone: [phone]  Fax: [phone]  PATIENT NAME: [name] [address] [place] [address] [phone] (home)  DOB: 09-Feb-1956 MRN: [id number]  PRIMARY CARE PROVIDER:    Raymondo Band, MD,  [address] [place] [address] [phone]  REFERRING PROVIDER:   Raymondo Band, MD [place],  [place] [address] [phone]  RESPONSIBLE PARTY:   [name] Information     Name Relation Home Work [place] Sister [phone]        I met face to face with patient in the facility. Visit to build trust and highlight Palliative Medicine as specialized medical care for people living with serious illness, aimed at facilitating better quality of life through symptoms relief, assisting with advance care planning and complex medical decision making. NP called [name] and updated her on visit. She provided additional history as patient is unreliable historian due to cognitive impairment.   Palliative care team will continue to support patient, patient's family, and medical team.  ASSESSMENT AND / RECOMMENDATIONS:  ------------------------------------------------------------------------------------------------------------------------------------------------- Advance Care Planning: Our advance care planning conversation included a discussion about:    The value and importance of advance care planning  Difference between Hospice and Palliative care Exploration of goals of care in the event of a sudden injury or illness  Identification and preparation of a healthcare agent  Review and updating or creation of an  advance directive document .  CODE STATUS:Patient is a Full code  Goals of Care: Goals include to maximize quality of life and symptom management.   I spent  20 minutes providing this initial consultation.  More than 50% of the time in this consultation was spent on counseling patient and coordinating communication. --------------------------------------------------------------------------------------------------------------------------------------  Symptom Management/Plan:  Cognitive impairment: Worsening memory loss/confusion, impoverished thoughts likely related to history of TBI. Continue ongoing supportive care. Followed by Neurologist.  Anxiety/depression: Recurrent , continue buspirone and mirtazapine as ordered.  Use de-escalation techniques.  Encourage socialization and participation in facility activities. Seizure: Managed with phenytoin, gabapentin.  Follow-up with neurologist as planned/needed. Routine CBC CMP, phenytoin.  Follow up: Palliative care will continue to follow for complex medical decision making, advance care planning, and clarification of goals. Return 6 weeks or prn. Encouraged to call provider sooner with any concerns.   Family /Caregiver/Community Supports: Patient in SNF for ongoing care  HOSPICE ELIGIBILITY/DIAGNOSIS: TBD  Chief Complaint: Initial Palliative care visit  HISTORY OF PRESENT ILLNESS:  [name] is a 67 y.o. year old male  with multiple morbidities requiring close monitoring/management, and with high risk of complications and  mortality: Dementia, Severe peripheral vascular disease requiring bilateral AKA. History of TBI, lung nodule followed by pulmonologist, seizure disorder, last seizure per sister 2010.  Patient endorsed memory loss, denies pain/discomfort, FLACC O .  History obtained from review of EMR, discussion with primary team, caregiver, family and/or [name].  Review and summarization of Epic records shows history from other than patient. Rest of 10 point ROS asked and negative. Independent interpretation of tests and reviewed as needed, available labs, patient records, imaging, studies and related documents from the EMR.  PHYSICAL  EXAM: GEN: Frail, in no acute distress  Cardiac: S1 S2 Respiratory: Diminished in the bases, in no respiratory distress GI: soft, nontender, nondistended, + BS   MS: BAKA Skin: warm and dry, no rash to visible skin Neuro: generalized weakness, A and O x 1, memory  loss Psych: non-anxious affect  PAST MEDICAL HISTORY:  Active Ambulatory Problems    Diagnosis Date Noted   Hyponatremia 10/18/2011   Seizures ([place]) 10/18/2011   Hypertension 10/18/2011   TBI (traumatic brain injury) ([facility]) 10/18/2011   H/O alcohol abuse 10/18/2011   Tobacco abuse 10/18/2011   Cardiomyopathy, nonischemic ([facility]) 02/14/2012   Short-term memory loss 02/14/2012   Shoulder pain, right 02/16/2012   Healthcare maintenance 02/16/2012   Hyperextension of knee or lower leg 05/08/2012   Lumbosacral radiculopathy at L4 07/30/2012   Urge urinary incontinence 08/26/2012   Depression 10/16/2012   Dry skin 10/16/2012   Hyperlipidemia 10/18/2012   Poor short term memory 10/18/2012   AAA (abdominal aortic aneurysm) without rupture ([facility]) 10/21/2012   Thoracic aneurysm without mention of rupture 10/21/2012   Lung nodule    Subdural hematoma ([place] [facility]) 06/14/2021   Protein-calorie malnutrition, severe 06/15/2021   Dementia without behavioral disturbance ([place] [facility]) 04/25/2021   PVD (peripheral vascular disease) ([facility]) 02/06/2022   Anxiety    Complicated UTI (urinary tract infection) A999333   Acute metabolic encephalopathy [id number]   UTI (urinary tract infection) 06/15/2022   Chronic ulcer of left heel ([facility]) 06/15/2022   Phenytoin toxicity 07/14/2022   Seizure disorder ([place]) 07/14/2022   Acute sinusitis 07/14/2022   Chronic pain 07/14/2022   Dilantin toxicity 07/15/2022   Resolved Ambulatory Problems    Diagnosis Date Noted   Confusion 02/14/2012   Acute URI 05/08/2012   Thoracic aortic aneurysm ([facility]) 09/12/2012   Aneurysm of thoracic aorta ([facility]) 05/10/2014   Thoracic aortic aneurysm without rupture ([facility] [place]) 06/11/2014    Past Medical History:  Diagnosis Date   AAA (abdominal aortic aneurysm) (HCC)    Alcohol abuse    Aortic aneurysm (HCC)    Blood transfusion    Cardiomegaly    Lumbar herniated disc    Memory deficits    Radiculopathy    Urinary urgency     SOCIAL HX:  Social History   Tobacco Use   Smoking status: Former    Packs/day: 0.10    Types: Cigarettes    Passive exposure: Never   Smokeless tobacco: Never   Tobacco comments:    "as much as he can"- per Med Tech at [employer] 07/01/19  Substance Use Topics   Alcohol use: No    Alcohol/week: 0.0 standard drinks of alcohol    Comment: per sister, last drink was apr 5th--2013 hx of ETOH     FAMILY HX:  Family History  Problem Relation Age of Onset   Liver disease Mother    Hypertension Mother    Breast cancer Maternal Aunt    Esophageal cancer Cousin    Esophageal cancer Maternal Uncle    Colon cancer Neg Hx    Stomach cancer Neg Hx    Colon polyps Neg Hx    Rectal cancer Neg Hx       ALLERGIES:  Allergies  Allergen Reactions   Isovue [Iopamidol] Hives    Pt broke out in one hive on his chest after contrast on 01/03/15.  Pt will need full premeds in the future per Dr Martinique.        PERTINENT MEDICATIONS:  Outpatient Encounter Medications as of 09/05/2022  Medication Sig   acetaminophen (TYLENOL) 500 MG tablet Take 1,000 mg by mouth daily.   Amino Acids-Protein Hydrolys (PRO-STAT) LIQD Take 30 mLs by mouth 2 (two) times daily.   ascorbic acid (VITAMIN C) 500 MG tablet Take 500 mg by mouth daily.  aspirin EC 81 MG tablet Take 1 tablet (81 mg total) by mouth daily.   atenolol (TENORMIN) 50 MG tablet Take 1 tablet (50 mg total) by mouth daily.   busPIRone (BUSPAR) 10 MG tablet Take 10 mg by mouth 3 (three) times daily.   cholecalciferol (GNP VITAMIN D3) 10 MCG (400 UNIT) TABS tablet Take 400 Units by mouth daily.   clopidogrel (PLAVIX) 75 MG tablet Take 1 tablet (75 mg total) by mouth daily.   docusate sodium (COLACE) 100  MG capsule Take 100 mg by mouth daily.   ferrous gluconate (FERGON) 324 MG tablet Take 324 mg by mouth See admin instructions. 324 mg once daily on Monday, Thursday.   gabapentin (NEURONTIN) 100 MG capsule Take 200 mg by mouth at bedtime.   guaifenesin (ROBITUSSIN) 100 MG/5ML syrup Take 100 mg by mouth 2 (two) times daily.   Multiple Vitamins-Minerals (MULTIVITAMIN WITH MINERALS) tablet Take 1 tablet by mouth daily.   oxyCODONE-acetaminophen (PERCOCET/ROXICET) 5-325 MG tablet Take 1 tablet by mouth every 4 (four) hours as needed (pain).   phenytoin (DILANTIN) 100 MG ER capsule Take 1 capsule (100 mg total) by mouth daily.   phenytoin (DILANTIN) 200 MG ER capsule Take 1 capsule (200 mg total) by mouth at bedtime.   phenytoin (DILANTIN) 50 MG tablet Chew 1 tablet (50 mg total) by mouth daily.   pravastatin (PRAVACHOL) 40 MG tablet Take 40 mg by mouth at bedtime.   No facility-administered encounter medications on file as of 09/05/2022.     Thank you for the opportunity to participate in the care of Mr. Rohlik.  The palliative care team will continue to follow. Please call our office at [phone] if we can be of additional assistance.   Note: Portions of this note were generated with Lobbyist. Dictation errors may occur despite best attempts at proofreading.  Teodoro Spray, NP

## 2022-10-03 ENCOUNTER — Non-Acute Institutional Stay: Payer: Medicare Other | Admitting: Hospice

## 2022-10-03 DIAGNOSIS — R569 Unspecified convulsions: Secondary | ICD-10-CM

## 2022-10-03 DIAGNOSIS — F419 Anxiety disorder, unspecified: Secondary | ICD-10-CM

## 2022-10-03 DIAGNOSIS — Z515 Encounter for palliative care: Secondary | ICD-10-CM

## 2022-10-03 DIAGNOSIS — R4189 Other symptoms and signs involving cognitive functions and awareness: Secondary | ICD-10-CM

## 2022-10-03 NOTE — Progress Notes (Signed)
Therapist, nutritional Palliative Care Consult Note Telephone: [phone]  Fax: [phone]  PATIENT NAME: [name] [address] [place] [address] [phone] (home)  DOB: 1955-07-27 MRN: [id number]  PRIMARY CARE PROVIDER:    Karna Dupes, MD,  [address] [place] [address] [phone]  REFERRING PROVIDER:   Karna Dupes, MD [address] [place],  [place] [address] [phone]  RESPONSIBLE PARTY:   Rande Brunt Information     Name Relation Home Work [place] Sister [phone]        I met face to face with patient in the facility. Visit to build trust and highlight Palliative Medicine as specialized medical care for people living with serious illness, aimed at facilitating better quality of life through symptoms relief, assisting with advance care planning and complex medical decision making. NP called [name] and updated her on visit. She provided additional history as patient is unreliable historian due to cognitive impairment.   Palliative care team will continue to support patient, patient's family, and medical team.  ASSESSMENT AND / RECOMMENDATIONS:    CODE STATUS:Patient is a Full code  Goals of Care: Goals include to maximize quality of life and symptom management.   Symptom Management/Plan:  Cognitive impairment: memory loss/confusion at baseline, impoverished thoughts likely related to history of TBI. Continue ongoing supportive care.  Fall/safety precautions.  Followed by Neurologist.   Anxiety/depression: Recurrent, continue buspirone and mirtazapine as ordered.  Use de-escalation techniques.  Encourage socialization and participation in facility activities.  Seizure: No report of seizures since last visit.  Continue phenytoin, gabapentin.  Follow-up with neurologist as planned/needed. Routine CBC CMP, phenytoin.   Follow  up: Palliative care will continue to follow for complex medical decision making, advance care planning, and clarification of goals. Return 6 weeks or prn. Encouraged to call provider sooner with any concerns.   Family /Caregiver/Community Supports: Patient in SNF for ongoing care  HOSPICE ELIGIBILITY/DIAGNOSIS: TBD  Chief Complaint: Initial Palliative care visit  HISTORY OF PRESENT ILLNESS:  [name] is a 67 y.o. year old male  with multiple morbidities requiring close monitoring/management, and with high risk of complications and  mortality: Dementia, Severe peripheral vascular disease requiring bilateral AKA. History of TBI, lung nodule followed by pulmonologist, seizure disorder, last seizure per sister 2010.  Patient endorsed memory loss, denies pain/discomfort, FLACC O, nursing with no concerns today.Marland Kitchen  History obtained from review of EMR, discussion with primary team, caregiver, family and/or Mr. Dolman.  Review and summarization of Epic records shows history from other than patient. Rest of 10 point ROS asked and negative. Independent interpretation of tests and reviewed as needed, available labs, patient records, imaging, studies and related documents from the EMR.  PHYSICAL EXAM: GEN: in no acute distress  Cardiac: S1 S2 Respiratory: Lung sounds clear to auscultation, no adventitious lung sounds GI: soft, nontender, nondistended, + BS   MS: BAKA Skin: warm and dry, no rash to visible skin Neuro: generalized weakness, A and O x 1, memory loss Psych: non-anxious affect  PAST MEDICAL HISTORY:  Active Ambulatory Problems    Diagnosis Date Noted   Hyponatremia 10/18/2011   Seizures 10/18/2011   Hypertension 10/18/2011   TBI (traumatic brain injury) 10/18/2011   H/O alcohol abuse 10/18/2011   Tobacco abuse 10/18/2011   Cardiomyopathy, nonischemic 02/14/2012   Short-term memory loss 02/14/2012   Shoulder pain, right 02/16/2012   Healthcare maintenance 02/16/2012  Hyperextension of knee or lower leg 05/08/2012   Lumbosacral radiculopathy at L4 07/30/2012   Urge urinary incontinence 08/26/2012   Depression 10/16/2012   Dry skin 10/16/2012   Hyperlipidemia 10/18/2012   Poor short term memory 10/18/2012   AAA (abdominal aortic aneurysm) without rupture 10/21/2012   Thoracic aneurysm without mention of rupture 10/21/2012   Lung nodule    Subdural hematoma 06/14/2021   Protein-calorie malnutrition, severe 06/15/2021   Dementia without behavioral disturbance 04/25/2021   PVD (peripheral vascular disease) 02/06/2022   Anxiety    Complicated UTI (urinary tract infection) 06/14/2022   Acute metabolic encephalopathy 06/15/2022   UTI (urinary tract infection) 06/15/2022   Chronic ulcer of left heel 06/15/2022   Phenytoin toxicity 07/14/2022   Seizure disorder 07/14/2022   Acute sinusitis 07/14/2022   Chronic pain 07/14/2022   Dilantin toxicity 07/15/2022   Resolved Ambulatory Problems    Diagnosis Date Noted   Confusion 02/14/2012   Acute URI 05/08/2012   Thoracic aortic aneurysm 09/12/2012   Aneurysm of thoracic aorta 05/10/2014   Thoracic aortic aneurysm without rupture 06/11/2014   Past Medical History:  Diagnosis Date   AAA (abdominal aortic aneurysm) (HCC)    Alcohol abuse    Aortic aneurysm (HCC)    Blood transfusion    Cardiomegaly    Lumbar herniated disc    Memory deficits    Radiculopathy    Urinary urgency     SOCIAL HX:  Social History   Tobacco Use   Smoking status: Former    Packs/day: .1    Types: Cigarettes    Passive exposure: Never   Smokeless tobacco: Never   Tobacco comments:    "as much as he can"- [facility]  Substance Use Topics   Alcohol use: No    Alcohol/week: 0.0 standard drinks of alcohol    Comment: per sister, last drink was apr 5th--2013 hx of ETOH     FAMILY HX:  Family History  Problem Relation Age of Onset   Liver disease Mother    Hypertension Mother    Breast  cancer Maternal Aunt    Esophageal cancer Cousin    Esophageal cancer Maternal Uncle    Colon cancer Neg Hx    Stomach cancer Neg Hx    Colon polyps Neg Hx    Rectal cancer Neg Hx       ALLERGIES:  Allergies  Allergen Reactions   Isovue [Iopamidol] Hives    Pt broke out in one hive on his chest after contrast on 01/03/15.  Pt will need full premeds in the future per Dr SwazilandJordan.        PERTINENT MEDICATIONS:  Outpatient Encounter Medications as of 10/03/2022  Medication Sig   acetaminophen (TYLENOL) 500 MG tablet Take 1,000 mg by mouth daily.   Amino Acids-Protein Hydrolys (PRO-STAT) LIQD Take 30 mLs by mouth 2 (two) times daily.   ascorbic acid (VITAMIN C) 500 MG tablet Take 500 mg by mouth daily.   aspirin EC 81 MG tablet Take 1 tablet (81 mg total) by mouth daily.   atenolol (TENORMIN) 50 MG tablet Take 1 tablet (50 mg total) by mouth daily.   busPIRone (BUSPAR) 10 MG tablet Take 10 mg by mouth 3 (three) times daily.   cholecalciferol (GNP VITAMIN D3) 10 MCG (400 UNIT) TABS tablet Take 400 Units by mouth daily.   clopidogrel (PLAVIX) 75 MG tablet Take 1 tablet (75 mg total) by mouth daily.   docusate sodium (  COLACE) 100 MG capsule Take 100 mg by mouth daily.   ferrous gluconate (FERGON) 324 MG tablet Take 324 mg by mouth See admin instructions. 324 mg once daily on Monday, Thursday.   gabapentin (NEURONTIN) 100 MG capsule Take 200 mg by mouth at bedtime.   guaifenesin (ROBITUSSIN) 100 MG/5ML syrup Take 100 mg by mouth 2 (two) times daily.   Multiple Vitamins-Minerals (MULTIVITAMIN WITH MINERALS) tablet Take 1 tablet by mouth daily.   oxyCODONE-acetaminophen (PERCOCET/ROXICET) 5-325 MG tablet Take 1 tablet by mouth every 4 (four) hours as needed (pain).   phenytoin (DILANTIN) 100 MG ER capsule Take 1 capsule (100 mg total) by mouth daily.   phenytoin (DILANTIN) 200 MG ER capsule Take 1 capsule (200 mg total) by mouth at bedtime.   phenytoin (DILANTIN) 50 MG tablet Chew 1 tablet (50  mg total) by mouth daily.   pravastatin (PRAVACHOL) 40 MG tablet Take 40 mg by mouth at bedtime.   No facility-administered encounter medications on file as of 10/03/2022.   I spent  35 minutes providing this consultation; this includes time spent with patient/family, chart review and documentation. More than 50% of the time in this consultation was spent on counseling and coordinating communication   Thank you for the opportunity to participate in the care of Mr. Marotta.  The palliative care team will continue to follow. Please call our office at [phone] if we can be of additional assistance.   Note: Portions of this note were generated with Scientist, clinical (histocompatibility and immunogenetics). Dictation errors may occur despite best attempts at proofreading.  Rosaura Carpenter, NP

## 2022-10-11 ENCOUNTER — Emergency Department (HOSPITAL_COMMUNITY): Payer: Medicaid Other

## 2022-10-11 ENCOUNTER — Inpatient Hospital Stay (HOSPITAL_COMMUNITY)
Admission: EM | Admit: 2022-10-11 | Discharge: 2022-10-16 | DRG: 871 | Disposition: A | Discharge status: Skilled Nursing Facility | Payer: Medicaid Other | Source: Skilled Nursing Facility | Attending: Internal Medicine | Admitting: Internal Medicine

## 2022-10-11 DIAGNOSIS — Z7982 Long term (current) use of aspirin: Secondary | ICD-10-CM

## 2022-10-11 DIAGNOSIS — E785 Hyperlipidemia, unspecified: Secondary | ICD-10-CM | POA: Diagnosis present

## 2022-10-11 DIAGNOSIS — R6521 Severe sepsis with septic shock: Secondary | ICD-10-CM | POA: Diagnosis present

## 2022-10-11 DIAGNOSIS — Z803 Family history of malignant neoplasm of breast: Secondary | ICD-10-CM

## 2022-10-11 DIAGNOSIS — Z87820 Personal history of traumatic brain injury: Secondary | ICD-10-CM

## 2022-10-11 DIAGNOSIS — Y658 Other specified misadventures during surgical and medical care: Secondary | ICD-10-CM | POA: Diagnosis present

## 2022-10-11 DIAGNOSIS — I10 Essential (primary) hypertension: Secondary | ICD-10-CM | POA: Diagnosis present

## 2022-10-11 DIAGNOSIS — N3091 Cystitis, unspecified with hematuria: Secondary | ICD-10-CM

## 2022-10-11 DIAGNOSIS — Z79899 Other long term (current) drug therapy: Secondary | ICD-10-CM

## 2022-10-11 DIAGNOSIS — D62 Acute posthemorrhagic anemia: Secondary | ICD-10-CM | POA: Diagnosis present

## 2022-10-11 DIAGNOSIS — Z800 Family history of malignant neoplasm of digestive organs: Secondary | ICD-10-CM

## 2022-10-11 DIAGNOSIS — A419 Sepsis, unspecified organism: Secondary | ICD-10-CM

## 2022-10-11 DIAGNOSIS — E860 Dehydration: Secondary | ICD-10-CM | POA: Diagnosis present

## 2022-10-11 DIAGNOSIS — R6520 Severe sepsis without septic shock: Secondary | ICD-10-CM | POA: Diagnosis present

## 2022-10-11 DIAGNOSIS — Z91041 Radiographic dye allergy status: Secondary | ICD-10-CM

## 2022-10-11 DIAGNOSIS — F32A Depression, unspecified: Secondary | ICD-10-CM | POA: Diagnosis present

## 2022-10-11 DIAGNOSIS — B964 Proteus (mirabilis) (morganii) as the cause of diseases classified elsewhere: Secondary | ICD-10-CM | POA: Diagnosis present

## 2022-10-11 DIAGNOSIS — Z89612 Acquired absence of left leg above knee: Secondary | ICD-10-CM

## 2022-10-11 DIAGNOSIS — E871 Hypo-osmolality and hyponatremia: Secondary | ICD-10-CM | POA: Diagnosis present

## 2022-10-11 DIAGNOSIS — E876 Hypokalemia: Secondary | ICD-10-CM | POA: Diagnosis not present

## 2022-10-11 DIAGNOSIS — Z7902 Long term (current) use of antithrombotics/antiplatelets: Secondary | ICD-10-CM

## 2022-10-11 DIAGNOSIS — G9341 Metabolic encephalopathy: Secondary | ICD-10-CM | POA: Diagnosis present

## 2022-10-11 DIAGNOSIS — B9689 Other specified bacterial agents as the cause of diseases classified elsewhere: Secondary | ICD-10-CM | POA: Diagnosis present

## 2022-10-11 DIAGNOSIS — N390 Urinary tract infection, site not specified: Secondary | ICD-10-CM | POA: Diagnosis present

## 2022-10-11 DIAGNOSIS — S069XAA Unspecified intracranial injury with loss of consciousness status unknown, initial encounter: Secondary | ICD-10-CM | POA: Diagnosis present

## 2022-10-11 DIAGNOSIS — N179 Acute kidney failure, unspecified: Secondary | ICD-10-CM

## 2022-10-11 DIAGNOSIS — F0390 Unspecified dementia without behavioral disturbance: Secondary | ICD-10-CM | POA: Diagnosis present

## 2022-10-11 DIAGNOSIS — Z8249 Family history of ischemic heart disease and other diseases of the circulatory system: Secondary | ICD-10-CM

## 2022-10-11 DIAGNOSIS — Z1611 Resistance to penicillins: Secondary | ICD-10-CM | POA: Diagnosis present

## 2022-10-11 DIAGNOSIS — Z8679 Personal history of other diseases of the circulatory system: Secondary | ICD-10-CM

## 2022-10-11 DIAGNOSIS — T8383XA Hemorrhage of genitourinary prosthetic devices, implants and grafts, initial encounter: Secondary | ICD-10-CM | POA: Diagnosis present

## 2022-10-11 DIAGNOSIS — R7881 Bacteremia: Secondary | ICD-10-CM

## 2022-10-11 DIAGNOSIS — Z1152 Encounter for screening for COVID-19: Secondary | ICD-10-CM

## 2022-10-11 DIAGNOSIS — Z87891 Personal history of nicotine dependence: Secondary | ICD-10-CM

## 2022-10-11 DIAGNOSIS — A4159 Other Gram-negative sepsis: Principal | ICD-10-CM | POA: Diagnosis present

## 2022-10-11 DIAGNOSIS — Z89611 Acquired absence of right leg above knee: Secondary | ICD-10-CM

## 2022-10-11 LAB — CBC WITH DIFFERENTIAL/PLATELET
HCT: 28.3 % — ABNORMAL LOW (ref 39.0–52.0)
MCHC: 31.1 g/dL (ref 30.0–36.0)
Platelets: 152 10*3/uL (ref 150–400)

## 2022-10-11 LAB — COMPREHENSIVE METABOLIC PANEL
ALT: 20 U/L (ref 0–44)
AST: 33 U/L (ref 15–41)
Albumin: 2.8 g/dL — ABNORMAL LOW (ref 3.5–5.0)
Alkaline Phosphatase: 195 U/L — ABNORMAL HIGH (ref 38–126)
Anion gap: 11 (ref 5–15)
BUN: 29 mg/dL — ABNORMAL HIGH (ref 8–23)
CO2: 23 mmol/L (ref 22–32)
Calcium: 8.4 mg/dL — ABNORMAL LOW (ref 8.9–10.3)
Chloride: 102 mmol/L (ref 98–111)
Creatinine, Ser: 1.23 mg/dL (ref 0.61–1.24)
GFR, Estimated: >60 mL/min (ref >60)
Glucose, Bld: 96 mg/dL (ref 70–99)
Potassium: 3.6 mmol/L (ref 3.5–5.1)
Sodium: 136 mmol/L (ref 135–145)
Total Bilirubin: 0.5 mg/dL (ref 0.3–1.2)
Total Protein: 6 g/dL — ABNORMAL LOW (ref 6.5–8.1)

## 2022-10-11 LAB — LACTIC ACID, PLASMA: Lactic Acid, Venous: 2.8 mmol/L (ref 0.5–1.9)

## 2022-10-11 LAB — PROTIME-INR
INR: 1.3 — ABNORMAL HIGH (ref 0.8–1.2)
Prothrombin Time: 16.3 seconds — ABNORMAL HIGH (ref 11.4–15.2)

## 2022-10-11 LAB — APTT: aPTT: 34 seconds (ref 24–36)

## 2022-10-11 MED ORDER — VANCOMYCIN HCL 750 MG/150ML IV SOLN
750.0000 mg | Freq: Once | INTRAVENOUS | Status: AC
  Administered 2022-10-12: 750 mg via INTRAVENOUS
  Filled 2022-10-11: qty 150

## 2022-10-11 MED ORDER — LACTATED RINGERS IV BOLUS (SEPSIS)
250.0000 mL | Freq: Once | INTRAVENOUS | Status: AC
  Administered 2022-10-11: 250 mL via INTRAVENOUS

## 2022-10-11 MED ORDER — LORAZEPAM 2 MG/ML IJ SOLN
0.5000 mg | Freq: Once | INTRAMUSCULAR | Status: AC
  Administered 2022-10-11: 0.5 mg via INTRAVENOUS
  Filled 2022-10-11: qty 1

## 2022-10-11 MED ORDER — ACETAMINOPHEN 650 MG RE SUPP
650.0000 mg | Freq: Once | RECTAL | Status: AC
  Administered 2022-10-11: 650 mg via RECTAL
  Filled 2022-10-11: qty 1

## 2022-10-11 MED ORDER — LACTATED RINGERS IV BOLUS
500.0000 mL | Freq: Once | INTRAVENOUS | Status: AC
  Administered 2022-10-12: 500 mL via INTRAVENOUS

## 2022-10-11 MED ORDER — METRONIDAZOLE 500 MG/100ML IV SOLN
500.0000 mg | Freq: Once | INTRAVENOUS | Status: AC
  Administered 2022-10-11: 500 mg via INTRAVENOUS
  Filled 2022-10-11: qty 100

## 2022-10-11 MED ORDER — LACTATED RINGERS IV SOLN
INTRAVENOUS | Status: AC
  Administered 2022-10-11: 700 mL via INTRAVENOUS

## 2022-10-11 MED ORDER — LACTATED RINGERS IV BOLUS (SEPSIS): 1000.0000 mL | Freq: Once | INTRAVENOUS | Status: DC

## 2022-10-11 MED ORDER — SODIUM CHLORIDE 0.9 % IV SOLN
2.0000 g | Freq: Once | INTRAVENOUS | Status: AC
  Administered 2022-10-12: 2 g via INTRAVENOUS
  Filled 2022-10-11: qty 12.5

## 2022-10-11 NOTE — Progress Notes (Signed)
Pharmacy Antibiotic Note  [name] is a 67 y.o. male admitted on 10/11/2022 with sepsis.  Pharmacy has been consulted for vancomycin and cefepime dosing.  Plan: - Give vancomycin  IV x1 now - Give cefepime 2g IV x1 now  -Vancomycin levels as needed -F/u BMP to start maintenance dosing    Height:  (172.7 cm) Weight: 34.1 kg (75 lb 2.8 oz) IBW/kg (Calculated) : 68.4  Temp (24hrs), Avg:103.1 F (39.5 C), Min:103.1 F (39.5 C), Max:103.1 F (39.5 C)  No results for input(s): "WBC", "CREATININE", "LATICACIDVEN", "VANCOTROUGH", "VANCOPEAK", "VANCORANDOM", "GENTTROUGH", "GENTPEAK", "GENTRANDOM", "TOBRATROUGH", "TOBRAPEAK", "TOBRARND", "AMIKACINPEAK", "AMIKACINTROU", "AMIKACIN" in the last 168 hours.  CrCl cannot be calculated (Patient's most recent lab result is older than the maximum 21 days allowed.).    Allergies  Allergen Reactions   Isovue [Iopamidol] Hives    Pt broke out in one hive on his chest after contrast on 01/03/15.  Pt will need full premeds in the future per Dr Swaziland.      Antimicrobials this admission: 4/18 vancomycin >>  4/18 cefepime >>    Microbiology results: 4/18 BCx: collected 4/18 resp panel: needs to be collected    Thank you for allowing pharmacy to be a part of this patient's care.  Cherylin Mylar, PharmD PGY1 Pharmacy Resident 4/18/202411:03 PM

## 2022-10-11 NOTE — Sepsis Progress Note (Signed)
Elink monitoring for the code sepsis protocol.  

## 2022-10-11 NOTE — ED Triage Notes (Signed)
[employer] nursing, nurse reports for 3 days he has had hematuria with no medications given. They observed shivering tonight.  Medic reports temp 103.7  Capnogrpahy 25 Baseline is Aox4  Currently altered.  20 left forearm.  92 palp 130hr 32rr 154bgl

## 2022-10-12 DIAGNOSIS — A4159 Other Gram-negative sepsis: Secondary | ICD-10-CM | POA: Diagnosis present

## 2022-10-12 DIAGNOSIS — R6521 Severe sepsis with septic shock: Secondary | ICD-10-CM | POA: Diagnosis present

## 2022-10-12 DIAGNOSIS — E785 Hyperlipidemia, unspecified: Secondary | ICD-10-CM | POA: Diagnosis present

## 2022-10-12 DIAGNOSIS — Z87820 Personal history of traumatic brain injury: Secondary | ICD-10-CM | POA: Diagnosis not present

## 2022-10-12 DIAGNOSIS — Y658 Other specified misadventures during surgical and medical care: Secondary | ICD-10-CM | POA: Diagnosis present

## 2022-10-12 DIAGNOSIS — E876 Hypokalemia: Secondary | ICD-10-CM | POA: Diagnosis not present

## 2022-10-12 DIAGNOSIS — N3001 Acute cystitis with hematuria: Secondary | ICD-10-CM | POA: Diagnosis not present

## 2022-10-12 DIAGNOSIS — G9341 Metabolic encephalopathy: Secondary | ICD-10-CM | POA: Diagnosis present

## 2022-10-12 DIAGNOSIS — T8383XA Hemorrhage of genitourinary prosthetic devices, implants and grafts, initial encounter: Secondary | ICD-10-CM | POA: Diagnosis present

## 2022-10-12 DIAGNOSIS — Z7982 Long term (current) use of aspirin: Secondary | ICD-10-CM | POA: Diagnosis not present

## 2022-10-12 DIAGNOSIS — R6520 Severe sepsis without septic shock: Secondary | ICD-10-CM | POA: Diagnosis not present

## 2022-10-12 DIAGNOSIS — S069XAA Unspecified intracranial injury with loss of consciousness status unknown, initial encounter: Secondary | ICD-10-CM | POA: Diagnosis not present

## 2022-10-12 DIAGNOSIS — D62 Acute posthemorrhagic anemia: Secondary | ICD-10-CM | POA: Diagnosis present

## 2022-10-12 DIAGNOSIS — I10 Essential (primary) hypertension: Secondary | ICD-10-CM | POA: Diagnosis present

## 2022-10-12 DIAGNOSIS — E860 Dehydration: Secondary | ICD-10-CM | POA: Diagnosis present

## 2022-10-12 DIAGNOSIS — Z87891 Personal history of nicotine dependence: Secondary | ICD-10-CM | POA: Diagnosis not present

## 2022-10-12 DIAGNOSIS — Z1152 Encounter for screening for COVID-19: Secondary | ICD-10-CM | POA: Diagnosis not present

## 2022-10-12 DIAGNOSIS — Z89612 Acquired absence of left leg above knee: Secondary | ICD-10-CM | POA: Diagnosis not present

## 2022-10-12 DIAGNOSIS — Z89611 Acquired absence of right leg above knee: Secondary | ICD-10-CM | POA: Diagnosis not present

## 2022-10-12 DIAGNOSIS — F0390 Unspecified dementia without behavioral disturbance: Secondary | ICD-10-CM | POA: Diagnosis present

## 2022-10-12 DIAGNOSIS — E871 Hypo-osmolality and hyponatremia: Secondary | ICD-10-CM | POA: Diagnosis not present

## 2022-10-12 DIAGNOSIS — N390 Urinary tract infection, site not specified: Secondary | ICD-10-CM | POA: Diagnosis present

## 2022-10-12 DIAGNOSIS — N179 Acute kidney failure, unspecified: Secondary | ICD-10-CM | POA: Diagnosis not present

## 2022-10-12 DIAGNOSIS — F32A Depression, unspecified: Secondary | ICD-10-CM | POA: Diagnosis present

## 2022-10-12 DIAGNOSIS — Z8679 Personal history of other diseases of the circulatory system: Secondary | ICD-10-CM | POA: Diagnosis not present

## 2022-10-12 DIAGNOSIS — S069XAS Unspecified intracranial injury with loss of consciousness status unknown, sequela: Secondary | ICD-10-CM | POA: Diagnosis not present

## 2022-10-12 DIAGNOSIS — A419 Sepsis, unspecified organism: Secondary | ICD-10-CM | POA: Diagnosis not present

## 2022-10-12 DIAGNOSIS — R531 Weakness: Secondary | ICD-10-CM | POA: Diagnosis present

## 2022-10-12 DIAGNOSIS — Z1611 Resistance to penicillins: Secondary | ICD-10-CM | POA: Diagnosis present

## 2022-10-12 LAB — CBC WITH DIFFERENTIAL/PLATELET
Abs Immature Granulocytes: 0.01 10*3/uL (ref 0.00–0.07)
Abs Immature Granulocytes: 0.74 10*3/uL — ABNORMAL HIGH (ref 0.00–0.07)
Basophils Absolute: 0 10*3/uL (ref 0.0–0.1)
Basophils Absolute: 0 10*3/uL (ref 0.0–0.1)
Basophils Relative: 0 %
Eosinophils Absolute: 0 10*3/uL (ref 0.0–0.5)
Eosinophils Relative: 0 %
HCT: 23.7 % — ABNORMAL LOW (ref 39.0–52.0)
Hemoglobin: 8.8 g/dL — ABNORMAL LOW (ref 13.0–17.0)
Hemoglobin: 7.7 g/dL — ABNORMAL LOW (ref 13.0–17.0)
Immature Granulocytes: 3 %
Lymphocytes Relative: 3 %
Lymphs Abs: 0.7 10*3/uL (ref 0.7–4.0)
MCH: 27.6 pg (ref 26.0–34.0)
MCH: 28.5 pg (ref 26.0–34.0)
MCHC: 32.5 g/dL (ref 30.0–36.0)
MCV: 88.7 fL (ref 80.0–100.0)
MCV: 87.8 fL (ref 80.0–100.0)
Monocytes Absolute: 0 10*3/uL — ABNORMAL LOW (ref 0.1–1.0)
Monocytes Absolute: 1.5 10*3/uL — ABNORMAL HIGH (ref 0.1–1.0)
Monocytes Relative: 1 %
Monocytes Relative: 7 %
Neutro Abs: 4.4 10*3/uL (ref 1.7–7.7)
Neutro Abs: 19 10*3/uL — ABNORMAL HIGH (ref 1.7–7.7)
Neutrophils Relative %: 93 %
Neutrophils Relative %: 87 %
Platelets: 131 10*3/uL — ABNORMAL LOW (ref 150–400)
RBC: 3.19 MIL/uL — ABNORMAL LOW (ref 4.22–5.81)
RBC: 2.7 MIL/uL — ABNORMAL LOW (ref 4.22–5.81)
RDW: 15.1 % (ref 11.5–15.5)
RDW: 15.5 % (ref 11.5–15.5)
WBC: 4.8 10*3/uL (ref 4.0–10.5)
WBC: 22 10*3/uL — ABNORMAL HIGH (ref 4.0–10.5)
nRBC: 0 % (ref 0.0–0.2)
nRBC: 0 % (ref 0.0–0.2)

## 2022-10-12 LAB — URINALYSIS, ROUTINE W REFLEX MICROSCOPIC
Bacteria, UA: NONE SEEN
Bilirubin Urine: NEGATIVE
Glucose, UA: NEGATIVE mg/dL
Ketones, ur: NEGATIVE mg/dL
Nitrite: POSITIVE — AB
Protein, ur: >=300 mg/dL — AB
RBC / HPF: >50 RBC/hpf (ref 0–5)
Specific Gravity, Urine: 1.012 (ref 1.005–1.030)
WBC, UA: >50 WBC/hpf (ref 0–5)
pH: 8 (ref 5.0–8.0)

## 2022-10-12 LAB — LACTIC ACID, PLASMA
Lactic Acid, Venous: 2.8 mmol/L (ref 0.5–1.9)
Lactic Acid, Venous: 2.4 mmol/L (ref 0.5–1.9)
Lactic Acid, Venous: 2.4 mmol/L (ref 0.5–1.9)

## 2022-10-12 LAB — BLOOD CULTURE ID PANEL (REFLEXED) - BCID2

## 2022-10-12 LAB — PROCALCITONIN: Procalcitonin: 41.44 ng/mL

## 2022-10-12 LAB — GLUCOSE, CAPILLARY
Glucose-Capillary: 86 mg/dL (ref 70–99)
Glucose-Capillary: 84 mg/dL (ref 70–99)
Glucose-Capillary: 64 mg/dL — ABNORMAL LOW (ref 70–99)
Glucose-Capillary: 90 mg/dL (ref 70–99)
Glucose-Capillary: 94 mg/dL (ref 70–99)
Glucose-Capillary: 89 mg/dL (ref 70–99)

## 2022-10-12 LAB — RESP PANEL BY RT-PCR (RSV, FLU A&B, COVID)  RVPGX2
Influenza A by PCR: NEGATIVE
Influenza B by PCR: NEGATIVE
Resp Syncytial Virus by PCR: NEGATIVE
SARS Coronavirus 2 by RT PCR: NEGATIVE

## 2022-10-12 LAB — CULTURE, BLOOD (ROUTINE X 2)

## 2022-10-12 LAB — PSA: Prostatic Specific Antigen: 1.08 ng/mL (ref 0.00–4.00)

## 2022-10-12 LAB — CK: Total CK: 1106 U/L — ABNORMAL HIGH (ref 49–397)

## 2022-10-12 LAB — MRSA NEXT GEN BY PCR, NASAL: MRSA by PCR Next Gen: DETECTED — AB

## 2022-10-12 MED ORDER — DOCUSATE SODIUM 100 MG PO CAPS: 100.0000 mg | ORAL_CAPSULE | Freq: Two times a day (BID) | ORAL | Status: DC | PRN

## 2022-10-12 MED ORDER — SODIUM CHLORIDE 0.9 % IV SOLN
150.0000 mg | Freq: Every day | INTRAVENOUS | Status: DC
  Administered 2022-10-12: 150 mg via INTRAVENOUS
  Filled 2022-10-12 (×2): qty 3

## 2022-10-12 MED ORDER — LACTATED RINGERS IV BOLUS
1000.0000 mL | Freq: Once | INTRAVENOUS | Status: AC
  Administered 2022-10-12: 1000 mL via INTRAVENOUS

## 2022-10-12 MED ORDER — DEXTROSE 50 % IV SOLN
INTRAVENOUS | Status: AC
  Filled 2022-10-12: qty 50

## 2022-10-12 MED ORDER — SODIUM CHLORIDE 0.9 % IV SOLN
2.0000 g | INTRAVENOUS | Status: DC
  Administered 2022-10-12 – 2022-10-14 (×3): 2 g via INTRAVENOUS
  Filled 2022-10-12 (×3): qty 20

## 2022-10-12 MED ORDER — SODIUM CHLORIDE 0.9 % IV SOLN: 2.0000 g | INTRAVENOUS | Status: DC

## 2022-10-12 MED ORDER — SODIUM CHLORIDE 0.9 % IV SOLN
200.0000 mg | Freq: Every day | INTRAVENOUS | Status: DC
  Administered 2022-10-12: 200 mg via INTRAVENOUS
  Filled 2022-10-12: qty 4

## 2022-10-12 MED ORDER — ORAL CARE MOUTH RINSE: 15.0000 mL | OROMUCOSAL | Status: DC | PRN

## 2022-10-12 MED ORDER — VANCOMYCIN HCL 500 MG/100ML IV SOLN: 500.0000 mg | INTRAVENOUS | Status: DC

## 2022-10-12 MED ORDER — ASPIRIN 81 MG PO CHEW
81.0000 mg | CHEWABLE_TABLET | Freq: Every day | ORAL | Status: DC
  Administered 2022-10-13 – 2022-10-16 (×4): 81 mg via ORAL
  Filled 2022-10-12 (×4): qty 1

## 2022-10-12 MED ORDER — PRAVASTATIN SODIUM 40 MG PO TABS
40.0000 mg | ORAL_TABLET | Freq: Every day | ORAL | Status: DC
  Administered 2022-10-12: 40 mg via ORAL
  Filled 2022-10-12 (×2): qty 1

## 2022-10-12 MED ORDER — ENOXAPARIN SODIUM 30 MG/0.3ML IJ SOSY
30.0000 mg | PREFILLED_SYRINGE | Freq: Every day | INTRAMUSCULAR | Status: DC
  Administered 2022-10-12: 30 mg via SUBCUTANEOUS
  Filled 2022-10-12: qty 0.3

## 2022-10-12 MED ORDER — PHENYTOIN SODIUM EXTENDED 30 MG PO CAPS: 150.0000 mg | ORAL_CAPSULE | Freq: Every day | ORAL | Status: DC

## 2022-10-12 MED ORDER — POLYETHYLENE GLYCOL 3350 17 G PO PACK: 17.0000 g | PACK | Freq: Every day | ORAL | Status: DC | PRN

## 2022-10-12 MED ORDER — HEPARIN SODIUM (PORCINE) 5000 UNIT/ML IJ SOLN
5000.0000 [IU] | Freq: Two times a day (BID) | INTRAMUSCULAR | Status: DC
  Administered 2022-10-13 – 2022-10-14 (×3): 5000 [IU] via SUBCUTANEOUS
  Filled 2022-10-12 (×4): qty 1

## 2022-10-12 MED ORDER — SODIUM CHLORIDE 0.9 % IV SOLN
250.0000 mL | INTRAVENOUS | Status: DC
  Administered 2022-10-12: 250 mL via INTRAVENOUS

## 2022-10-12 MED ORDER — NOREPINEPHRINE 4 MG/250ML-% IV SOLN: 0.0000 ug/min | INTRAVENOUS | Status: DC

## 2022-10-12 MED ORDER — MUPIROCIN 2 % EX OINT
1.0000 | TOPICAL_OINTMENT | Freq: Two times a day (BID) | CUTANEOUS | Status: DC
  Administered 2022-10-12 – 2022-10-16 (×8): 1 via NASAL
  Filled 2022-10-12 (×3): qty 22

## 2022-10-12 MED ORDER — NOREPINEPHRINE 4 MG/250ML-% IV SOLN
2.0000 ug/min | INTRAVENOUS | Status: DC
  Administered 2022-10-12: 2 ug/min via INTRAVENOUS
  Filled 2022-10-12: qty 250

## 2022-10-12 MED ORDER — TAMSULOSIN HCL 0.4 MG PO CAPS
0.4000 mg | ORAL_CAPSULE | Freq: Every day | ORAL | Status: DC
  Administered 2022-10-13 – 2022-10-16 (×4): 0.4 mg via ORAL
  Filled 2022-10-12 (×4): qty 1

## 2022-10-12 MED ORDER — CHLORHEXIDINE GLUCONATE CLOTH 2 % EX PADS
6.0000 | MEDICATED_PAD | Freq: Every day | CUTANEOUS | Status: DC
  Administered 2022-10-12 – 2022-10-15 (×4): 6 via TOPICAL

## 2022-10-12 MED ORDER — PHENYTOIN SODIUM EXTENDED 100 MG PO CAPS: 200.0000 mg | ORAL_CAPSULE | Freq: Every day | ORAL | Status: DC

## 2022-10-12 NOTE — TOC CM/SW Note (Signed)
.   Transition of Care [facility]) Screening Note   Patient Details  Name: [name] Date of Birth: [birth year]   [facility] CM/SW Contact:    Elliot Cousin, RN Phone Number: [phone] 10/12/2022, 9:52 AM    Transition of Care Department [facility]) has reviewed patient. We will continue to monitor patient advancement through interdisciplinary progression rounds. CM reviewed chart. Patient admitted from [facility], TOCCM/CSW will continue to follow for dc needs.

## 2022-10-12 NOTE — Progress Notes (Signed)
Patient's blood cultures are growing 2 out of 4 bottles gram-negative rods, proteus   Will DC vancomycin, continue ceftriaxone.  Follow-up sensitivity results    Cheri Fowler, MD [facility] See Amion for pager If no response to pager, please call [phone] until 7pm After 7pm, Please call E-link [phone]
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
## 2022-10-12 NOTE — Progress Notes (Signed)
PHARMACY - PHYSICIAN COMMUNICATION CRITICAL VALUE ALERT - BLOOD CULTURE IDENTIFICATION (BCID)  Assessment:  [name] is an 67 y.o. male who presented to [facility] on 10/11/2022 with a chief complaint of AMS, generalized weakness, and hematuria. Presumed urinary source.    4/18 Bcx: 2/4 bottles GNRs- identified as Proteus spp. On BCID- no resistance mechanisms detected.  Name of physician (or Provider) Contacted: Dr. Merrily Pew   Current antibiotics: Ceftriaxone and vancomycin   Changes to prescribed antibiotics recommended:  Stop Vancomycin, continue ceftriaxone  Recommendations accepted by provider  Results for orders placed or performed during the hospital encounter of 10/11/22  Blood Culture ID Panel (Reflexed) (Collected: 10/11/2022 10:24 PM)  Result Value Ref Range   Enterococcus faecalis NOT DETECTED NOT DETECTED   Enterococcus Faecium NOT DETECTED NOT DETECTED   Listeria monocytogenes NOT DETECTED NOT DETECTED   Staphylococcus species NOT DETECTED NOT DETECTED   Staphylococcus aureus (BCID) NOT DETECTED NOT DETECTED   Staphylococcus epidermidis NOT DETECTED NOT DETECTED   Staphylococcus lugdunensis NOT DETECTED NOT DETECTED   Streptococcus species NOT DETECTED NOT DETECTED   Streptococcus agalactiae NOT DETECTED NOT DETECTED   Streptococcus pneumoniae NOT DETECTED NOT DETECTED   Streptococcus pyogenes NOT DETECTED NOT DETECTED   A.calcoaceticus-baumannii NOT DETECTED NOT DETECTED   Bacteroides fragilis NOT DETECTED NOT DETECTED   Enterobacterales DETECTED (A) NOT DETECTED   Enterobacter cloacae complex NOT DETECTED NOT DETECTED   Escherichia coli NOT DETECTED NOT DETECTED   Klebsiella aerogenes NOT DETECTED NOT DETECTED   Klebsiella oxytoca NOT DETECTED NOT DETECTED   Klebsiella pneumoniae NOT DETECTED NOT DETECTED   Proteus species DETECTED (A) NOT DETECTED   Salmonella species NOT DETECTED NOT DETECTED   Serratia marcescens NOT DETECTED NOT DETECTED   Haemophilus  influenzae NOT DETECTED NOT DETECTED   Neisseria meningitidis NOT DETECTED NOT DETECTED   Pseudomonas aeruginosa NOT DETECTED NOT DETECTED   Stenotrophomonas maltophilia NOT DETECTED NOT DETECTED   Candida albicans NOT DETECTED NOT DETECTED   Candida auris NOT DETECTED NOT DETECTED   Candida glabrata NOT DETECTED NOT DETECTED   Candida krusei NOT DETECTED NOT DETECTED   Candida parapsilosis NOT DETECTED NOT DETECTED   Candida tropicalis NOT DETECTED NOT DETECTED   Cryptococcus neoformans/gattii NOT DETECTED NOT DETECTED   CTX-M ESBL NOT DETECTED NOT DETECTED   Carbapenem resistance IMP NOT DETECTED NOT DETECTED   Carbapenem resistance KPC NOT DETECTED NOT DETECTED   Carbapenem resistance NDM NOT DETECTED NOT DETECTED   Carbapenem resist OXA 48 LIKE NOT DETECTED NOT DETECTED   Carbapenem resistance VIM NOT DETECTED NOT DETECTED    Jani Gravel, PharmD PGY-2 Infectious Diseases Resident  10/12/2022 2:39 PM
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
## 2022-10-12 NOTE — ED Provider Notes (Signed)
[place] EMERGENCY DEPARTMENT AT [facility] Provider Note   CSN: [id number] Arrival date & time: 10/11/22  2156     History Chief Complaint  Patient presents with   Weakness    [name] is a 67 y.o. male with history of AAA, TBI, hypertension, hyperlipidemia, currently living in long-term care facility presents to the ER for evaluation of hematuria for the past 3 days.  Majority of the history is obtained from EMS and nursing given the patient has underlying dementia.  They report they observed him shivering tonight and he was altered.  They report his baseline is A&O x 4 however sister disagrees with this. They called EMS.  EMS reports febrile at one 3.7 with a heart rate of 130 and blood pressure 92 systolic.  Patient does not have any complaints.  He denies any chest pain, back pain, belly pain, pain when he urinates.  Level 5 caveat due to patient's underlying dementia and acuity of the situation.  Had 1 L of IV fluids from EMS.  I was able to speak to the sister at bedside.  She reports that he was fine earlier today and the nursing facility did not mention that he was bleeding whenever he urinates until today.   Weakness      Home Medications Prior to Admission medications   Medication Sig Start Date End Date Taking? Authorizing Provider  acetaminophen (TYLENOL) 500 MG tablet Take 1,000 mg by mouth daily.    [provider]  Amino Acids-Protein Hydrolys (PRO-STAT) LIQD Take 30 mLs by mouth 2 (two) times daily.    [provider]  ascorbic acid (VITAMIN C) 500 MG tablet Take 500 mg by mouth daily.    [provider]  aspirin EC 81 MG tablet Take 1 tablet (81 mg total) by mouth daily. 08/17/21   Lars Mage, PA-C  atenolol (TENORMIN) 50 MG tablet Take 1 tablet (50 mg total) by mouth daily. 06/24/21   Meredeth Ide, MD  busPIRone (BUSPAR) 10 MG tablet Take 10 mg by mouth 3 (three) times daily.    [provider]   cholecalciferol (GNP VITAMIN D3) 10 MCG (400 UNIT) TABS tablet Take 400 Units by mouth daily.    [provider]  clopidogrel (PLAVIX) 75 MG tablet Take 1 tablet (75 mg total) by mouth daily. 08/17/21   Lars Mage, PA-C  docusate sodium (COLACE) 100 MG capsule Take 100 mg by mouth daily.    [provider]  ferrous gluconate (FERGON) 324 MG tablet Take 324 mg by mouth See admin instructions. 324 mg once daily on Monday, Thursday.    [provider]  gabapentin (NEURONTIN) 100 MG capsule Take 200 mg by mouth at bedtime.    [provider]  guaifenesin (ROBITUSSIN) 100 MG/5ML syrup Take 100 mg by mouth 2 (two) times daily.    [provider]  Multiple Vitamins-Minerals (MULTIVITAMIN WITH MINERALS) tablet Take 1 tablet by mouth daily.    [provider]  oxyCODONE-acetaminophen (PERCOCET/ROXICET) 5-325 MG tablet Take 1 tablet by mouth every 4 (four) hours as needed (pain). 07/18/22   Osvaldo Shipper, MD  phenytoin (DILANTIN) 100 MG ER capsule Take 1 capsule (100 mg total) by mouth daily. 07/19/22   Osvaldo Shipper, MD  phenytoin (DILANTIN) 200 MG ER capsule Take 1 capsule (200 mg total) by mouth at bedtime. 07/19/22   Osvaldo Shipper, MD  phenytoin (DILANTIN) 50 MG tablet Chew 1 tablet (50 mg total) by mouth  daily. 07/19/22   Osvaldo Shipper, MD  pravastatin (PRAVACHOL) 40 MG tablet Take 40 mg by mouth at bedtime.    [provider]      Allergies    Isovue [iopamidol]    Review of Systems   Review of Systems  Unable to perform ROS: Acuity of condition    Physical Exam Updated Vital Signs BP (!) 85/37   Pulse (!) 105   Temp (!) 103.1 F (39.5 C) (Rectal)   Resp 13   Ht  (1.727 m)   Wt 34.1 kg   SpO2 98%   BMI 11.43 kg/m  Physical Exam Vitals and nursing note reviewed. Exam conducted with a chaperone present Pennie Rushing, Charity fundraiser).  Constitutional:      Appearance: He is ill-appearing.     Comments: Patient is warm.  He is  oriented x 2 and awake, fidgeting with the cords and wiring attached to him.  HENT:     Head: Normocephalic and atraumatic.     Mouth/Throat:     Mouth: Mucous membranes are dry.  Neck:     Comments: Patient fully move his head side-to-side and flexion.  Unable to assess extension as patient does not not follow simple commands, however is awake Cardiovascular:     Rate and Rhythm: Tachycardia present.  Pulmonary:     Effort: Pulmonary effort is normal. No respiratory distress.     Breath sounds: Normal breath sounds.  Abdominal:     General: Bowel sounds are normal.     Palpations: Abdomen is soft.     Tenderness: There is no abdominal tenderness. There is no guarding or rebound.     Comments: Abdomen is soft and nontender to palpation.  Patient does not elicit any painful response with both light and deep palpation of the abdomen.  Normal active bowel sounds.  Genitourinary:    Comments: Bright red blood seen on the scrotum questionable from the meatus.  I do not see any active or brisk bleeding from the penis or meatus.  On his brief that removed from him for examining, it has darker blood saturated in the material.  I do not see any signs of any Fournier's. Musculoskeletal:     Cervical back: Normal range of motion.     Comments: Bilateral AKA.  Skin:    General: Skin is warm and dry.  Neurological:     Comments: Moving upper extremities.  Oriented x 2.     ED Results / Procedures / Treatments   Labs (all labs ordered are listed, but only abnormal results are displayed) Labs Reviewed  LACTIC ACID, PLASMA - Abnormal; Notable for the following components:      Result Value   Lactic Acid, Venous 2.8 (*)    All other components within normal limits  COMPREHENSIVE METABOLIC PANEL - Abnormal; Notable for the following components:   BUN 29 (*)    Calcium 8.4 (*)    Total Protein 6.0 (*)    Albumin 2.8 (*)    Alkaline Phosphatase 195 (*)    All other components within normal  limits  CBC WITH DIFFERENTIAL/PLATELET - Abnormal; Notable for the following components:   RBC 3.19 (*)    Hemoglobin 8.8 (*)    HCT 28.3 (*)    All other components within normal limits  PROTIME-INR - Abnormal; Notable for the following components:   Prothrombin Time 16.3 (*)    INR 1.3 (*)    All other components within normal limits  RESP  PANEL BY RT-PCR (RSV, FLU A&B, COVID)  RVPGX2  CULTURE, BLOOD (ROUTINE X 2)  CULTURE, BLOOD (ROUTINE X 2)  URINE CULTURE  APTT  LACTIC ACID, PLASMA  URINALYSIS, ROUTINE W REFLEX MICROSCOPIC  PROCALCITONIN    EKG None  Radiology DG Chest Port 1 View  Result Date: 10/11/2022 CLINICAL DATA:  Questionable sepsis. Evaluate for acute abnormality. EXAM: PORTABLE CHEST 1 VIEW COMPARISON:  PA Lat 07/14/2022 FINDINGS: Arch and descending thoracic aortic stent graft is again noted. The cardiac size normal. The lungs are hypoexpanded. There is increased linear atelectasis in the bases. No focal pneumonia is evident, but with limited view of the bases. The mediastinal configuration is stable. Multiple overlying monitor wires.  Thoracic cage is intact. IMPRESSION: 1. Low lung volumes with increased linear atelectasis in the bases. No focal pneumonia is evident. 2. Aortic stent graft. Electronically Signed   By: Almira Bar M.D.   On: 10/11/2022 23:14    Procedures .Critical Care  Performed by: Achille Rich, PA-C Authorized by: Achille Rich, PA-C   Critical care provider statement:    Critical care time (minutes):  60   Critical care was necessary to treat or prevent imminent or life-threatening deterioration of the following conditions:  Circulatory failure and sepsis   Critical care was time spent personally by me on the following activities:  Development of treatment plan with patient or surrogate, discussions with consultants, evaluation of patient's response to treatment, examination of patient, ordering and review of laboratory studies, ordering  and review of radiographic studies, ordering and performing treatments and interventions, pulse oximetry, re-evaluation of patient's condition, review of old charts and obtaining history from patient or surrogate   Care discussed with: admitting provider      Medications Ordered in ED Medications  lactated ringers infusion (700 mLs Intravenous New Bag/Given 10/11/22 2323)  metroNIDAZOLE (FLAGYL) IVPB 500 mg (500 mg Intravenous New Bag/Given 10/11/22 2323)  vancomycin (VANCOREADY) IVPB 750 mg/150 mL (has no administration in time range)  ceFEPIme (MAXIPIME) 2 g in sodium chloride 0.9 % 100 mL IVPB (has no administration in time range)  lactated ringers bolus 500 mL (has no administration in time range)  lactated ringers bolus 250 mL (0 mLs Intravenous Stopped 10/11/22 2331)  acetaminophen (TYLENOL) suppository 650 mg (650 mg Rectal Given 10/11/22 2325)  LORazepam (ATIVAN) injection 0.5 mg (0.5 mg Intravenous Given 10/11/22 2325)    ED Course/ Medical Decision Making/ A&P    Medical Decision Making Amount and/or Complexity of Data Reviewed Labs: ordered. Radiology: ordered. ECG/medicine tests: ordered.  Risk OTC drugs. Prescription drug management. Decision regarding hospitalization.   67 y.o. male presents to the ER for evaluation of hematuria, AMS. Differential diagnosis includes but is not limited to Cystitis, urinary calculi, renal cell carcinoma, bladder cancer, glomerulonephritis, polycystic kidneys, anticoagulant usage, interstitial nephritis, BPH, prostate cancer, sepsis. Vital signs show hypotensive systolically in the 80s to 90s, tachycardic in the 110s to 100s, febrile at 103.1 rectally, mild tachypnea, satting well on room air. Physical exam as noted above.   Given patient's initial set of vital signs, ED sepsis was ordered with broad-spectrum antibiotics cefepime, Flagyl, vancomycin.  Sepsis fluids ordered of 250 mL given the sepsis bolus per kilogram was going to be 1250 mL.   PR Tylenol ordered.  Sister reports that he was being fidgety and was requesting Ativan.  Ativan 0.5 ordered.  I independently reviewed and interpreted the patient's labs. CMP shows elevated BUN 29, creatinine at 1.23 although previously was at 0.56.  Mildly decreased calcium, total protein, and albumin.  He does have a newly elevated alk phos at 195.  PT/INR mildly elevated at 1.3 and 16.3.  aPTT within normal limits.  CBC shows no leukocytosis.  Anemia with a hemoglobin of 8.8 which appears to be worse and that is patient's baseline around 9.9-11.  New thrombocytopenia from patient's usual thrombocytosis.  Negative for COVID, flu, RSV.  Urinalysis shows turbid urine with large amount of hemoglobin.  There is greater than 300 protein and is nitrite positive with mod amount of leukocytes.  Greater than 50 red blood cells and greater than 50 white blood cells seen however no bacteria.  Urine culture pending.  Blood cultures pending. Lactic acid persistently at 2.8 x2.  Confusion and fever, likely from hemorrhagic cystitis.  CXR shows 1. Low lung volumes with increased linear atelectasis in the bases. No focal pneumonia is evident. 2. Aortic stent graft.   Still persistently hypotensive, will order another 500 LR bolus.  Hypotension could be from bilateral AKA's the patient only weighs 34 kg and is 3 foot 1 already.  He does have previous blood pressures showing systolically in the 120s.  Given his AKI and hematuria, could be fluid down.  Will order additional fluids.  The patient has received over of fluids and is still hypotensive, however HR is improving. Fever is improving. He has had approx 300cc of urine output with the fluids. I consulted critical care and spoke with Dr. Tonia Brooms. He recommended additional IV fluids and can call if he hasn't improved.   Attending assessed at bedside.   Overall, the patient is received over 4500 mL of fluid and is systolically in the 90s with maps in this low  60s. Afebrile now. HR in the 90s. I called critical care again and spoke with Dr. Tonia Brooms who recommended pressors and ICU admission. Levophed ordered.   I discharged lab and imaging results with sister at bedside as well as plan for admission.   I discussed this case with my attending physician who cosigned this note including patient's presenting symptoms, physical exam, and planned diagnostics and interventions. Attending physician stated agreement with plan or made changes to plan which were implemented.   Attending physician assessed patient at bedside.  Portions of this report may have been transcribed using voice recognition software. Every effort was made to ensure accuracy; however, inadvertent computerized transcription errors may be present.   Final Clinical Impression(s) / ED Diagnoses Final diagnoses:  Sepsis with acute renal failure and septic shock, due to unspecified organism, unspecified acute renal failure type  Hemorrhagic cystitis  AKI (acute kidney injury)    Rx / DC Orders ED Discharge Orders     None         Achille Rich, PA-C 10/12/22 5621    Gilda Crease, MD 10/17/22 0110

## 2022-10-12 NOTE — Progress Notes (Signed)
An USGPIV (ultrasound guided PIV) has been placed for short-term vasopressor infusion. A correctly placed ivWatch must be used when administering Vasopressors. Should this treatment be needed beyond 72 hours, central line access should be obtained.  It will be the responsibility of the bedside nurse to follow best practice to prevent extravasations.   

## 2022-10-12 NOTE — Progress Notes (Signed)
PHARMACY - PHYSICIAN COMMUNICATION CRITICAL VALUE ALERT - BLOOD CULTURE IDENTIFICATION (BCID)  Assessment:  [name] is an 67 y.o. male who presented to [facility] on 10/11/2022 with a chief complaint of AMS, generalized weakness, and hematuria. Presumed urinary source.    4/18 Bcx: 2/4 bottles GNRs- identified as Proteus spp. On BCID- no resistance mechanisms detected.  PM UPDATE: another set has resulted so blood cultures are now 4/4 GNR (same morphology as prior result)  Name of physician (or Provider) Contacted: Dr. Merrily Pew   Current antibiotics: Ceftriaxone  Changes to prescribed antibiotics recommended:  Continue ceftriaxone 2g IV q24h as previously recommended  Results for orders placed or performed during the hospital encounter of 10/11/22  Blood Culture ID Panel (Reflexed) (Collected: 10/11/2022 10:24 PM)  Result Value Ref Range   Enterococcus faecalis NOT DETECTED NOT DETECTED   Enterococcus Faecium NOT DETECTED NOT DETECTED   Listeria monocytogenes NOT DETECTED NOT DETECTED   Staphylococcus species NOT DETECTED NOT DETECTED   Staphylococcus aureus (BCID) NOT DETECTED NOT DETECTED   Staphylococcus epidermidis NOT DETECTED NOT DETECTED   Staphylococcus lugdunensis NOT DETECTED NOT DETECTED   Streptococcus species NOT DETECTED NOT DETECTED   Streptococcus agalactiae NOT DETECTED NOT DETECTED   Streptococcus pneumoniae NOT DETECTED NOT DETECTED   Streptococcus pyogenes NOT DETECTED NOT DETECTED   A.calcoaceticus-baumannii NOT DETECTED NOT DETECTED   Bacteroides fragilis NOT DETECTED NOT DETECTED   Enterobacterales DETECTED (A) NOT DETECTED   Enterobacter cloacae complex NOT DETECTED NOT DETECTED   Escherichia coli NOT DETECTED NOT DETECTED   Klebsiella aerogenes NOT DETECTED NOT DETECTED   Klebsiella oxytoca NOT DETECTED NOT DETECTED   Klebsiella pneumoniae NOT DETECTED NOT DETECTED   Proteus species DETECTED (A) NOT DETECTED   Salmonella species NOT DETECTED NOT  DETECTED   Serratia marcescens NOT DETECTED NOT DETECTED   Haemophilus influenzae NOT DETECTED NOT DETECTED   Neisseria meningitidis NOT DETECTED NOT DETECTED   Pseudomonas aeruginosa NOT DETECTED NOT DETECTED   Stenotrophomonas maltophilia NOT DETECTED NOT DETECTED   Candida albicans NOT DETECTED NOT DETECTED   Candida auris NOT DETECTED NOT DETECTED   Candida glabrata NOT DETECTED NOT DETECTED   Candida krusei NOT DETECTED NOT DETECTED   Candida parapsilosis NOT DETECTED NOT DETECTED   Candida tropicalis NOT DETECTED NOT DETECTED   Cryptococcus neoformans/gattii NOT DETECTED NOT DETECTED   CTX-M ESBL NOT DETECTED NOT DETECTED   Carbapenem resistance IMP NOT DETECTED NOT DETECTED   Carbapenem resistance KPC NOT DETECTED NOT DETECTED   Carbapenem resistance NDM NOT DETECTED NOT DETECTED   Carbapenem resist OXA 48 LIKE NOT DETECTED NOT DETECTED   Carbapenem resistance VIM NOT DETECTED NOT DETECTED    Rexford Maus, PharmD, BCPS 10/12/2022 4:51 PM

## 2022-10-13 ENCOUNTER — Inpatient Hospital Stay (HOSPITAL_COMMUNITY): Payer: Medicaid Other

## 2022-10-13 DIAGNOSIS — R6521 Severe sepsis with septic shock: Secondary | ICD-10-CM

## 2022-10-13 DIAGNOSIS — N3001 Acute cystitis with hematuria: Secondary | ICD-10-CM

## 2022-10-13 DIAGNOSIS — A419 Sepsis, unspecified organism: Secondary | ICD-10-CM | POA: Diagnosis not present

## 2022-10-13 LAB — BASIC METABOLIC PANEL
Anion gap: 9 (ref 5–15)
BUN: 17 mg/dL (ref 8–23)
CO2: 23 mmol/L (ref 22–32)
Calcium: 8 mg/dL — ABNORMAL LOW (ref 8.9–10.3)
Chloride: 105 mmol/L (ref 98–111)
Creatinine, Ser: 0.84 mg/dL (ref 0.61–1.24)
GFR, Estimated: >60 mL/min (ref >60)
Glucose, Bld: 91 mg/dL (ref 70–99)
Potassium: 3.2 mmol/L — ABNORMAL LOW (ref 3.5–5.1)
Sodium: 137 mmol/L (ref 135–145)

## 2022-10-13 LAB — CBC
HCT: 25.9 % — ABNORMAL LOW (ref 39.0–52.0)
Hemoglobin: 8.4 g/dL — ABNORMAL LOW (ref 13.0–17.0)
MCH: 28.1 pg (ref 26.0–34.0)
MCHC: 32.4 g/dL (ref 30.0–36.0)
MCV: 86.6 fL (ref 80.0–100.0)
Platelets: 152 10*3/uL (ref 150–400)
RBC: 2.99 MIL/uL — ABNORMAL LOW (ref 4.22–5.81)
RDW: 15.6 % — ABNORMAL HIGH (ref 11.5–15.5)
WBC: 18.1 10*3/uL — ABNORMAL HIGH (ref 4.0–10.5)
nRBC: 0 % (ref 0.0–0.2)

## 2022-10-13 LAB — MAGNESIUM: Magnesium: 1.5 mg/dL — ABNORMAL LOW (ref 1.7–2.4)

## 2022-10-13 LAB — GLUCOSE, CAPILLARY
Glucose-Capillary: 105 mg/dL — ABNORMAL HIGH (ref 70–99)
Glucose-Capillary: 104 mg/dL — ABNORMAL HIGH (ref 70–99)

## 2022-10-13 LAB — CK: Total CK: 2049 U/L — ABNORMAL HIGH (ref 49–397)

## 2022-10-13 LAB — PHOSPHORUS: Phosphorus: 1.5 mg/dL — ABNORMAL LOW (ref 2.5–4.6)

## 2022-10-13 LAB — CULTURE, BLOOD (ROUTINE X 2): Special Requests: ADEQUATE

## 2022-10-13 LAB — URINE CULTURE

## 2022-10-13 MED ORDER — POTASSIUM CHLORIDE 10 MEQ/100ML IV SOLN
10.0000 meq | INTRAVENOUS | Status: AC
  Administered 2022-10-13 (×2): 10 meq via INTRAVENOUS
  Filled 2022-10-13 (×2): qty 100

## 2022-10-13 MED ORDER — PHENYTOIN SODIUM EXTENDED 30 MG PO CAPS
150.0000 mg | ORAL_CAPSULE | Freq: Every day | ORAL | Status: DC
  Administered 2022-10-13 – 2022-10-15 (×3): 150 mg via ORAL
  Filled 2022-10-13 (×4): qty 5

## 2022-10-13 MED ORDER — POTASSIUM PHOSPHATES 15 MMOLE/5ML IV SOLN
45.0000 mmol | Freq: Once | INTRAVENOUS | Status: AC
  Administered 2022-10-13: 45 mmol via INTRAVENOUS
  Filled 2022-10-13: qty 15

## 2022-10-13 MED ORDER — LACTATED RINGERS IV SOLN: INTRAVENOUS | Status: DC

## 2022-10-13 MED ORDER — PHENYTOIN SODIUM EXTENDED 100 MG PO CAPS
200.0000 mg | ORAL_CAPSULE | Freq: Every day | ORAL | Status: DC
  Administered 2022-10-14 – 2022-10-15 (×3): 200 mg via ORAL
  Filled 2022-10-13 (×5): qty 2

## 2022-10-13 MED ORDER — MAGNESIUM SULFATE 4 GM/100ML IV SOLN
4.0000 g | Freq: Once | INTRAVENOUS | Status: AC
  Administered 2022-10-13: 4 g via INTRAVENOUS
  Filled 2022-10-13: qty 100

## 2022-10-13 MED ORDER — BUSPIRONE HCL 10 MG PO TABS
10.0000 mg | ORAL_TABLET | Freq: Three times a day (TID) | ORAL | Status: DC
  Administered 2022-10-13 – 2022-10-16 (×10): 10 mg via ORAL
  Filled 2022-10-13 (×12): qty 1

## 2022-10-13 MED ORDER — FERROUS GLUCONATE 324 (38 FE) MG PO TABS
324.0000 mg | ORAL_TABLET | ORAL | Status: DC
  Administered 2022-10-15: 324 mg via ORAL
  Filled 2022-10-13: qty 1

## 2022-10-13 MED ORDER — GABAPENTIN 100 MG PO CAPS
200.0000 mg | ORAL_CAPSULE | Freq: Every day | ORAL | Status: DC
  Administered 2022-10-13 – 2022-10-15 (×3): 200 mg via ORAL
  Filled 2022-10-13 (×3): qty 2

## 2022-10-13 NOTE — Progress Notes (Signed)
eLink Physician-Brief Progress Note Patient Name: TREJUAN MATHERNE DOB: 24-Aug-1955 MRN: [id number]   Date of Service  10/13/2022  HPI/Events of Note   K 3.2, Phosphorus 1.5, Magnesium 1.5.  Patient NPO, has Peripheral IV access.   eICU Interventions  magnesium, potassium phosphate and potassium chloride ordered     Intervention Category Minor Interventions: Electrolytes abnormality - evaluation and management  Kensington Rios 10/13/2022, 3:02 AM

## 2022-10-13 NOTE — Progress Notes (Signed)
NAME:  [name], MRN:  [id number], DOB:  [birth year], LOS: 1 ADMISSION DATE:  10/11/2022, CONSULTATION DATE: 10/12/2022 REFERRING MD:  Achille Rich , CHIEF COMPLAINT: Generalized weakness and altered mental status  History of Present Illness:  Patient is encephalopathic so most of the history is taken from chart review   67 year old male with history of TBI, AAA repair, hypertension and hyperlipidemia was brought in with from skilled care nursing facility complaint of altered mental status, generalized weakness and hematuria for few days.  In the emergency department patient was noted to be shivering, he spiked fever with Tmax 103, looked severely dry, was given 4 L of IV fluid but his blood pressure remained low, was started on vasopressor support, received broad-spectrum antibiotics, PCCM was consulted for help evaluation medical management.   Pertinent  Medical History   Past Medical History:  Diagnosis Date   AAA (abdominal aortic aneurysm) (HCC)    Alcohol abuse    h/o, quit april 5th, 2013   Anxiety    Aortic aneurysm (HCC)    Blood transfusion    Cardiomegaly    EF 25-30 % in 2005, EF 50-55% Oct 2013   Depression    Hyperlipidemia    Hypertension    Lumbar herniated disc    Memory deficits    Radiculopathy    Seizures (HCC)    last seizure several years ago- per sister 39 or below at [facility] 07-12-17   TBI (traumatic brain injury) [facility])    Sept 1988   Urinary urgency      Significant Hospital Events: Including procedures, antibiotic start and stop dates in addition to other pertinent events     Interim History / Subjective:   Off pressors 2.2 L urine output  Objective   Blood pressure 119/65, pulse 84, temperature 98.6 F (37 C), temperature source Oral, resp. rate 13, height  (1.727 m), weight 41.2 kg, SpO2 100 %.        Intake/Output Summary (Last 24 hours) at 10/13/2022 0720 Last data filed at 10/13/2022 0600 Gross per 24 hour  Intake 5038.74 ml   Output 2200 ml  Net 2838.74 ml    Filed Weights   10/11/22 2209 10/13/22 0500  Weight: 34.1 kg 41.2 kg    Examination: Physical exam: General: Acute on chronically ill-appearing male, lying on the bed HEENT: [place]/AT, eyes anicteric.  Severely dry mucus membranes Neuro: Oriented to name and place, pleasantly confused?  Baseline, Chest: Clear breath sounds, no wheezes or rhonchi Heart: Regular rate and rhythm, no murmurs or gallops Abdomen: Soft, nontender, nondistended, bowel sounds present Skin: No rash Bilateral AKA  CT renal 06/2022 at showed tiny bladder calculus Labs show hypokalemia, hypophosphatemia and hypomagnesemia, decreased leukocytosis, stable anemia  Resolved Hospital Problem list     Assessment & Plan:  Septic shock -Proteus bacteremia Acute urinary tract infection  Continue IV ceftriaxone, DC vancomycin Follow sensitivity and adjust antibiotics if needed  Acute kidney injury due to severe dehydration-resolved Patient baseline serum creatinine is around 1.6, he presented with serum creatinine of 1.2 Hypokalemia hypophosphatemia and hypomagnesemia will be repleted  Gross hematuria likely due to traumatic Foley catheter placement Patient does have gross hematuria in Foley bag Started on tamsulosin PSA is low Urine cleared  Acute on chronic blood loss anemia Patient baseline hemoglobin is around 9 Closely monitor H&H and transfuse if less than 7  Acute septic encephalopathy-resolved History of TBI Patient is on extended release phenytoin for seizure prophylaxis, will resume  AAA repair PAD Patient is on aspirin Plavix and statin Will resume aspirin, holding Plavix in the setting of gross hematuria Resume statin  Can transfer to floor and to triad  Best Practice (right click and "Reselect all SmartList Selections" daily)   Diet/type: Will resume oral diet  DVT prophylaxis: LMWH GI prophylaxis: N/A Lines: N/A Foley:  Yes, and it is still  needed Code Status:  full code Last date of multidisciplinary goals of care discussion [pending]  Labs   CBC: Recent Labs  Lab 10/11/22 2240 10/12/22 0605 10/13/22 0020  WBC 4.8 22.0* 18.1*  NEUTROABS 4.4 19.0*  --   HGB 8.8* 7.7* 8.4*  HCT 28.3* 23.7* 25.9*  MCV 88.7 87.8 86.6  PLT 152 131* 152     Basic Metabolic Panel: Recent Labs  Lab 10/11/22 2240 10/13/22 0020  NA 136 137  K 3.6 3.2*  CL 102 105  CO2 23 23  GLUCOSE 96 91  BUN 29* 17  CREATININE 1.23 0.84  CALCIUM 8.4* 8.0*  MG  --  1.5*  PHOS  --  1.5*    GFR: Estimated Creatinine Clearance: 51.1 mL/min (by C-G formula based on SCr of 0.84 mg/dL). Recent Labs  Lab 10/11/22 2240 10/12/22 0208 10/12/22 0605 10/12/22 1357 10/13/22 0020  PROCALCITON 41.44  --   --   --   --   WBC 4.8  --  22.0*  --  18.1*  LATICACIDVEN 2.8* 2.8* 2.4* 2.4*  --      Liver Function Tests: Recent Labs  Lab 10/11/22 2240  AST 33  ALT 20  ALKPHOS 195*  BILITOT 0.5  PROT 6.0*  ALBUMIN 2.8*    No results for input(s): "LIPASE", "AMYLASE" in the last 168 hours. No results for input(s): "AMMONIA" in the last 168 hours.  ABG    Component Value Date/Time   PHART 7.464 (H) 06/04/2014 1620   PCO2ART 44.2 06/04/2014 1620   PO2ART 77.2 (L) 06/04/2014 1620   HCO3 23.3 06/19/2022 0944   TCO2 24 06/19/2022 0944   ACIDBASEDEF 1.0 06/19/2022 0944   O2SAT 87 06/19/2022 0944     Coagulation Profile: Recent Labs  Lab 10/11/22 2240  INR 1.3*     Cardiac Enzymes: Recent Labs  Lab 10/12/22 0209  CKTOTAL 1,106*     HbA1C: Hgb A1c MFr Bld  Date/Time Value Ref Range Status  10/16/2012 04:48 PM 5.9 (H) <5.7 % Final    Comment:                                                                           According to the ADA Clinical Practice Recommendations for 2011, when HbA1c is used as a screening test:     >=6.5%   Diagnostic of Diabetes Mellitus            (if abnormal result is confirmed)   5.7-6.4%    Increased risk of developing Diabetes Mellitus   References:Diagnosis and Classification of Diabetes Mellitus,Diabetes Care,2011,34(Suppl 1):S62-S69 and Standards of Medical Care in         Diabetes - 2011,Diabetes Care,2011,34 (Suppl 1):S11-S61.      CBG: Recent Labs  Lab 10/12/22 1133 10/12/22 1527 10/12/22 1735 10/12/22 1916 10/12/22 2316  GLUCAP 84 64* 90 94 89    Cyril Mourning MD. FCCP. [facility] Pager : [phone]  If no response to pager , please call [phone] until 7 pm After 7:00 pm call Elink  [phone]   10/13/2022

## 2022-10-14 DIAGNOSIS — I10 Essential (primary) hypertension: Secondary | ICD-10-CM

## 2022-10-14 DIAGNOSIS — E871 Hypo-osmolality and hyponatremia: Secondary | ICD-10-CM

## 2022-10-14 DIAGNOSIS — R6520 Severe sepsis without septic shock: Secondary | ICD-10-CM | POA: Diagnosis not present

## 2022-10-14 DIAGNOSIS — A419 Sepsis, unspecified organism: Secondary | ICD-10-CM | POA: Diagnosis not present

## 2022-10-14 DIAGNOSIS — S069XAA Unspecified intracranial injury with loss of consciousness status unknown, initial encounter: Secondary | ICD-10-CM

## 2022-10-14 DIAGNOSIS — F0390 Unspecified dementia without behavioral disturbance: Secondary | ICD-10-CM | POA: Diagnosis not present

## 2022-10-14 DIAGNOSIS — S069XAS Unspecified intracranial injury with loss of consciousness status unknown, sequela: Secondary | ICD-10-CM

## 2022-10-14 LAB — URINE CULTURE: Culture: >=100000 — AB

## 2022-10-14 LAB — CBC WITH DIFFERENTIAL/PLATELET
Abs Immature Granulocytes: 0.06 10*3/uL (ref 0.00–0.07)
Basophils Absolute: 0 10*3/uL (ref 0.0–0.1)
Basophils Relative: 0 %
Eosinophils Absolute: 0.1 10*3/uL (ref 0.0–0.5)
Eosinophils Relative: 1 %
HCT: 25.8 % — ABNORMAL LOW (ref 39.0–52.0)
Hemoglobin: 8.7 g/dL — ABNORMAL LOW (ref 13.0–17.0)
Immature Granulocytes: 1 %
Lymphocytes Relative: 15 %
Lymphs Abs: 1.8 10*3/uL (ref 0.7–4.0)
MCH: 27.9 pg (ref 26.0–34.0)
MCHC: 33.7 g/dL (ref 30.0–36.0)
MCV: 82.7 fL (ref 80.0–100.0)
Monocytes Absolute: 1.3 10*3/uL — ABNORMAL HIGH (ref 0.1–1.0)
Monocytes Relative: 10 %
Neutro Abs: 9.1 10*3/uL — ABNORMAL HIGH (ref 1.7–7.7)
Neutrophils Relative %: 73 %
Platelets: 157 10*3/uL (ref 150–400)
RBC: 3.12 MIL/uL — ABNORMAL LOW (ref 4.22–5.81)
RDW: 15.4 % (ref 11.5–15.5)
WBC: 12.4 10*3/uL — ABNORMAL HIGH (ref 4.0–10.5)
nRBC: 0 % (ref 0.0–0.2)

## 2022-10-14 LAB — BASIC METABOLIC PANEL
Anion gap: 6 (ref 5–15)
BUN: 7 mg/dL — ABNORMAL LOW (ref 8–23)
CO2: 23 mmol/L (ref 22–32)
Calcium: 7.7 mg/dL — ABNORMAL LOW (ref 8.9–10.3)
Chloride: 103 mmol/L (ref 98–111)
Creatinine, Ser: 0.69 mg/dL (ref 0.61–1.24)
GFR, Estimated: >60 mL/min (ref >60)
Glucose, Bld: 129 mg/dL — ABNORMAL HIGH (ref 70–99)
Potassium: 3.6 mmol/L (ref 3.5–5.1)
Sodium: 132 mmol/L — ABNORMAL LOW (ref 135–145)

## 2022-10-14 LAB — GLUCOSE, CAPILLARY: Glucose-Capillary: 123 mg/dL — ABNORMAL HIGH (ref 70–99)

## 2022-10-14 LAB — CULTURE, BLOOD (ROUTINE X 2)

## 2022-10-14 LAB — PHOSPHORUS: Phosphorus: 2.9 mg/dL (ref 2.5–4.6)

## 2022-10-14 LAB — MAGNESIUM: Magnesium: 2.2 mg/dL (ref 1.7–2.4)

## 2022-10-14 MED ORDER — ENOXAPARIN SODIUM 30 MG/0.3ML IJ SOSY
30.0000 mg | PREFILLED_SYRINGE | INTRAMUSCULAR | Status: DC
  Administered 2022-10-14 – 2022-10-15 (×2): 30 mg via SUBCUTANEOUS
  Filled 2022-10-14 (×2): qty 0.3

## 2022-10-14 NOTE — Progress Notes (Signed)
PROGRESS NOTE    Chris AROCHO  WGN:[id number] DOB: Nov 05, 1955 DOA: 10/11/2022 PCP: Karna Dupes, MD    Brief Narrative:  Patient is a 67 year old male with past medical history of TBI, AAA repair, hypertension and hyperlipidemia was brought in from skilled nursing facility with complaints of altered mental status and hematuria.  In the ED, patient was febrile volume depleted with the low blood pressure and received IV fluids.  Patient was started on vasopressor and was admitted to the ICU.  Subsequently, vasopressors were discontinued. patient was noted to have Proteus UTI.  Patient was subsequently considered stable for transfer out of the ICU.  Assessment and plan   Septic shock -Proteus/providencia bacteremia Acute urinary tract infection  Continue IV Rocephin.  Sensitive to cephalosporin Zosyn but resistant to ampicillin and Unasyn blood cultures positive for Proteus and providencia..  Urine culture with Proteus and Providencia.  Renal ultrasound on 10/13/2022 with Foley catheter in place with notable ultrasound.  Discussed with infectious disease for further evaluation plan/guidance on antibiotic.  Mild leukocytosis noted.  No fever.  Acute kidney injury due to severe dehydration-improved after hydration.  Latest creatinine at 0.6.  Has a Foley catheter in place.  Hypokalemia hypophosphatemia and hypomagnesemia  has been replenished and improved.   Gross hematuria likely due to traumatic Foley catheter placement Continue tamsulosin.  Urine is clearing up.  Will try Foley catheter removal likely by tomorrow.  Acute on chronic blood loss anemia  Baseline hemoglobin of 9.0.  Hemoglobin today at 8.7.  Continue to monitor.  Has hematuria.  Acute septic encephalopathy-resolved History of TBI/dementia On extended release phenytoin.   AAA repair/PAD Continue aspirin.  Holding Plavix due to gross hematuria.  Continue statins.    DVT prophylaxis: enoxaparin (LOVENOX) injection  30 mg Start: 10/14/22 2200 SCDs Start: 10/12/22 0719   Code Status:     Code Status: Full Code  Disposition: Skilled nursing facility likely in 2 to 3 days  Status is: Inpatient  Remains inpatient appropriate because: Hematuria, bacteremia, IV antibiotics   Family Communication: None at bedside  Consultants:  PCCM   Procedures:  Foley catheter placement  Antimicrobials:  Rocephin IV  Anti-infectives (From admission, onward)    Start     Dose/Rate Route Frequency Ordered Stop   10/13/22 1000  vancomycin (VANCOREADY) IVPB 500 mg/100 mL  Status:  Discontinued        500 mg 100 mL/hr over 60 Minutes Intravenous Every 48 hours 10/12/22 0139 10/12/22 1444   10/12/22 2200  ceFEPIme (MAXIPIME) 2 g in sodium chloride 0.9 % 100 mL IVPB  Status:  Discontinued        2 g 200 mL/hr over 30 Minutes Intravenous Every 24 hours 10/12/22 0139 10/12/22 0719   10/12/22 2200  cefTRIAXone (ROCEPHIN) 2 g in sodium chloride 0.9 % 100 mL IVPB        2 g 200 mL/hr over 30 Minutes Intravenous Every 24 hours 10/12/22 0719     10/11/22 2330  vancomycin (VANCOREADY) IVPB 750 mg/150 mL        750 mg 150 mL/hr over 60 Minutes Intravenous  Once 10/11/22 2256 10/12/22 0231   10/11/22 2330  ceFEPIme (MAXIPIME) 2 g in sodium chloride 0.9 % 100 mL IVPB        2 g 200 mL/hr over 30 Minutes Intravenous  Once 10/11/22 2256 10/12/22 0117   10/11/22 2230  metroNIDAZOLE (FLAGYL) IVPB 500 mg        500 mg 100 mL/hr  over 60 Minutes Intravenous  Once 10/11/22 2219 10/12/22 0117      Subjective: Today, patient was seen and examined at bedside.  Patient denies any fever, chills, nausea, vomiting or abdominal pain.  Objective: Vitals:   10/14/22 0500 10/14/22 0544 10/14/22 1050 10/14/22 1335  BP:  137/69 137/72 114/66  Pulse:  80 88 94  Resp:  18    Temp:   98.6 F (37 C) 98.3 F (36.8 C)  TempSrc:   Oral   SpO2:  99% 98% 98%  Weight: 41.7 kg     Height:        Intake/Output Summary (Last 24 hours)  at 10/14/2022 1542 Last data filed at 10/14/2022 1110 Gross per 24 hour  Intake 332.19 ml  Output 3250 ml  Net -2917.81 ml   Filed Weights   10/11/22 2209 10/13/22 0500 10/14/22 0500  Weight: 34.1 kg 41.2 kg 41.7 kg    Physical Examination: Body mass index is 13.98 kg/m.   General: Thinly built, not in obvious distress, awake and Communicative HENT:   Mild pallor noted. Oral mucosa is moist.  Chest:  Clear breath sounds.   No crackles or wheezes.  CVS: S1 &S2 heard. No murmur.  Regular rate and rhythm. Abdomen: Soft, nontender, nondistended.  Bowel sounds are heard.  Foley catheter in place.  Urinary bag with clear urine. Extremities: No cyanosis, clubbing or edema.  Peripheral pulses are palpable.  Bilateral above-knee amputation. Psych: Alert, awake and oriented, flat affect. CNS:  No cranial nerve deficits.  Moves upper extremities. Skin: Warm and dry.   Data Reviewed:   CBC: Recent Labs  Lab 10/11/22 2240 10/12/22 0605 10/13/22 0020 10/14/22 0323  WBC 4.8 22.0* 18.1* 12.4*  NEUTROABS 4.4 19.0*  --  9.1*  HGB 8.8* 7.7* 8.4* 8.7*  HCT 28.3* 23.7* 25.9* 25.8*  MCV 88.7 87.8 86.6 82.7  PLT 152 131* 152 157    Basic Metabolic Panel: Recent Labs  Lab 10/11/22 2240 10/13/22 0020 10/14/22 0323  NA 136 137 132*  K 3.6 3.2* 3.6  CL 102 105 103  CO2 GLUCOSE 96 91 129*  BUN 29* 17 7*  CREATININE 1.23 0.84 0.69  CALCIUM 8.4* 8.0* 7.7*  MG  --  1.5* 2.2  PHOS  --  1.5* 2.9    Liver Function Tests: Recent Labs  Lab 10/11/22 2240  AST 33  ALT 20  ALKPHOS 195*  BILITOT 0.5  PROT 6.0*  ALBUMIN 2.8*     Radiology Studies: US RENAL  Result Date: 10/13/2022 CLINICAL DATA:  Acute cystitis. EXAM: RENAL / URINARY TRACT ULTRASOUND COMPLETE COMPARISON:  CT scan 07/25/2022 FINDINGS: Right Kidney: Renal measurements: 9.0 x 4.4 x 4.5 cm = volume: 93 mL. Normal renal cortical thickness and echogenicity without focal lesions or hydronephrosis. Left Kidney:  Renal measurements: 9.5 x 4.5 x 4.0 cm = volume: 109 mL. Normal renal cortical thickness and echogenicity without focal lesions or hydronephrosis. Bladder: Foley catheter in place.  No bladder lesions or bladder calculi. Other: None. IMPRESSION: 1. Normal renal ultrasound examination. 2. Foley catheter in the bladder. No bladder lesions or bladder calculi. Electronically Signed   By: Rudie Meyer M.D.   On: 10/13/2022 10:36      LOS: 2 days     Joycelyn Das, MD [facility] Available via Epic secure chat 7am-7pm After these hours, please refer to coverage provider listed on [email or web address] 10/14/2022, 3:42 PM

## 2022-10-14 NOTE — Hospital Course (Signed)
Patient is a 67 year old male with past medical history of TBI, AAA repair, hypertension and hyperlipidemia was brought in from skilled nursing facility with complaints of altered mental status and hematuria.  In the ED patient was febrile volume depleted with the low blood pressure and received IV fluids.  Patient was started on vasopressor and was admitted to the ICU.  Subsequently patient presents with discontinued.  Patient was noted to have Proteus UTI.  Patient was subsequently considered stable for transfer out of the ICU.  Assessment and plan   Septic shock -Proteus bacteremia Acute urinary tract infection  Continue IV Rocephin.  Blood cultures positive.  Urine culture with Proteus and Providencia.  acute kidney injury due to severe dehydration-improved after hydration.  Latest creatinine at 0.6.  Hypokalemia hypophosphatemia and hypomagnesemia  has been replenished and improved.   Gross hematuria likely due to traumatic Foley catheter placement Continue tamsulosin.  Acute on chronic blood loss anemia Baseline hemoglobin of 9.0.  Hemoglobin today at 8.7.  Acute septic encephalopathy-resolved History of TBI On extended release phenytoin.   AAA repair/PAD Continue aspirin.  Holding Plavix due to gross hematuria.  Continue statins.

## 2022-10-15 DIAGNOSIS — N179 Acute kidney failure, unspecified: Secondary | ICD-10-CM

## 2022-10-15 DIAGNOSIS — A419 Sepsis, unspecified organism: Secondary | ICD-10-CM

## 2022-10-15 DIAGNOSIS — R7881 Bacteremia: Secondary | ICD-10-CM

## 2022-10-15 DIAGNOSIS — F0390 Unspecified dementia without behavioral disturbance: Secondary | ICD-10-CM | POA: Diagnosis not present

## 2022-10-15 DIAGNOSIS — E871 Hypo-osmolality and hyponatremia: Secondary | ICD-10-CM | POA: Diagnosis not present

## 2022-10-15 DIAGNOSIS — N390 Urinary tract infection, site not specified: Secondary | ICD-10-CM | POA: Diagnosis not present

## 2022-10-15 DIAGNOSIS — R6521 Severe sepsis with septic shock: Secondary | ICD-10-CM | POA: Diagnosis not present

## 2022-10-15 DIAGNOSIS — I10 Essential (primary) hypertension: Secondary | ICD-10-CM | POA: Diagnosis not present

## 2022-10-15 LAB — BASIC METABOLIC PANEL
Anion gap: 7 (ref 5–15)
BUN: 6 mg/dL — ABNORMAL LOW (ref 8–23)
CO2: 23 mmol/L (ref 22–32)
Calcium: 7.9 mg/dL — ABNORMAL LOW (ref 8.9–10.3)
Chloride: 102 mmol/L (ref 98–111)
Creatinine, Ser: 0.68 mg/dL (ref 0.61–1.24)
GFR, Estimated: >60 mL/min (ref >60)
Glucose, Bld: 99 mg/dL (ref 70–99)
Potassium: 4 mmol/L (ref 3.5–5.1)
Sodium: 132 mmol/L — ABNORMAL LOW (ref 135–145)

## 2022-10-15 LAB — MAGNESIUM: Magnesium: 1.9 mg/dL (ref 1.7–2.4)

## 2022-10-15 LAB — CBC
HCT: 27.6 % — ABNORMAL LOW (ref 39.0–52.0)
Hemoglobin: 9 g/dL — ABNORMAL LOW (ref 13.0–17.0)
MCH: 27.7 pg (ref 26.0–34.0)
MCHC: 32.6 g/dL (ref 30.0–36.0)
MCV: 84.9 fL (ref 80.0–100.0)
Platelets: 175 10*3/uL (ref 150–400)
RBC: 3.25 MIL/uL — ABNORMAL LOW (ref 4.22–5.81)
RDW: 15.2 % (ref 11.5–15.5)
WBC: 8.7 10*3/uL (ref 4.0–10.5)
nRBC: 0 % (ref 0.0–0.2)

## 2022-10-15 LAB — CULTURE, BLOOD (ROUTINE X 2): Special Requests: ADEQUATE

## 2022-10-15 LAB — PHENYTOIN LEVEL, FREE AND TOTAL
Phenytoin, Free: 2.4 ug/mL — ABNORMAL HIGH (ref 1.0–2.0)
Phenytoin, Total: 18.6 ug/mL (ref 10.0–20.0)

## 2022-10-15 LAB — GLUCOSE, CAPILLARY: Glucose-Capillary: 96 mg/dL (ref 70–99)

## 2022-10-15 MED ORDER — DOCUSATE SODIUM 100 MG PO CAPS
100.0000 mg | ORAL_CAPSULE | Freq: Every day | ORAL | Status: DC
  Administered 2022-10-15 – 2022-10-16 (×2): 100 mg via ORAL
  Filled 2022-10-15 (×2): qty 1

## 2022-10-15 MED ORDER — ADULT MULTIVITAMIN W/MINERALS CH
1.0000 | ORAL_TABLET | Freq: Every day | ORAL | Status: DC
  Administered 2022-10-15 – 2022-10-16 (×2): 1 via ORAL
  Filled 2022-10-15 (×2): qty 1

## 2022-10-15 MED ORDER — VITAMIN C 500 MG PO TABS
500.0000 mg | ORAL_TABLET | Freq: Every day | ORAL | Status: DC
  Administered 2022-10-15 – 2022-10-16 (×2): 500 mg via ORAL
  Filled 2022-10-15 (×2): qty 1

## 2022-10-15 MED ORDER — PRAVASTATIN SODIUM 40 MG PO TABS
40.0000 mg | ORAL_TABLET | Freq: Every day | ORAL | Status: DC
  Administered 2022-10-15: 40 mg via ORAL
  Filled 2022-10-15: qty 1

## 2022-10-15 MED ORDER — ACETAMINOPHEN 500 MG PO TABS
1000.0000 mg | ORAL_TABLET | Freq: Every day | ORAL | Status: DC
  Administered 2022-10-15 – 2022-10-16 (×2): 1000 mg via ORAL
  Filled 2022-10-15 (×2): qty 2

## 2022-10-15 MED ORDER — GUAIFENESIN 100 MG/5ML PO LIQD
100.0000 mg | Freq: Two times a day (BID) | ORAL | Status: DC
  Administered 2022-10-15 – 2022-10-16 (×3): 100 mg via ORAL
  Filled 2022-10-15 (×3): qty 15

## 2022-10-15 MED ORDER — ATENOLOL 50 MG PO TABS
50.0000 mg | ORAL_TABLET | Freq: Every day | ORAL | Status: DC
  Administered 2022-10-15 – 2022-10-16 (×2): 50 mg via ORAL
  Filled 2022-10-15 (×2): qty 1

## 2022-10-15 MED ORDER — CLOPIDOGREL BISULFATE 75 MG PO TABS
75.0000 mg | ORAL_TABLET | Freq: Every day | ORAL | Status: DC
  Administered 2022-10-15 – 2022-10-16 (×2): 75 mg via ORAL
  Filled 2022-10-15 (×2): qty 1

## 2022-10-15 MED ORDER — CIPROFLOXACIN HCL 500 MG PO TABS
500.0000 mg | ORAL_TABLET | Freq: Two times a day (BID) | ORAL | Status: DC
  Administered 2022-10-15 – 2022-10-16 (×2): 500 mg via ORAL
  Filled 2022-10-15 (×3): qty 1

## 2022-10-15 MED ORDER — OXYCODONE-ACETAMINOPHEN 5-325 MG PO TABS
1.0000 | ORAL_TABLET | ORAL | Status: DC | PRN
  Administered 2022-10-15: 1 via ORAL
  Filled 2022-10-15: qty 1

## 2022-10-15 NOTE — Consult Note (Addendum)
[facility]    Date of Admission:  10/11/2022     Reason for Consult: Urinary tract infection and bacteremia     Referring Physician: [facility]  Current antibiotics: Ceftriaxone  ASSESSMENT:    67 y.o. male admitted with:  Urinary tract infection with secondary bacteremia: Patient presented with sepsis and found to have urine and blood cultures positive for Providencia stuartii and Proteus mirabilis.  Improved on broad-spectrum antibiotics. Acute kidney injury: Patient presented with creatinine of 1.23 which has improved back to baseline. Septic shock: Secondary to #1 with resolved sepsis physiology.  RECOMMENDATIONS:    Switch antibiotics to Ciprofloxacin per pharmacy dosing recommendations Plan for 7 days total duration of therapy.  Today is day #4.  End date for antibiotics = 10/18/2022 Will sign off, please call as needed   Principal Problem:   Severe sepsis Active Problems:   Hyponatremia   Hypertension   TBI (traumatic brain injury)   Dementia without behavioral disturbance   MEDICATIONS:    Scheduled Meds:  aspirin  81 mg Oral Daily   busPIRone  10 mg Oral TID   Chlorhexidine Gluconate Cloth  6 each Topical Daily   enoxaparin (LOVENOX) injection  30 mg Subcutaneous Q24H   ferrous gluconate  324 mg Oral Once per day on Mon Thu   gabapentin  200 mg Oral QHS   mupirocin ointment  1 Application Nasal BID   phenytoin  150 mg Oral Daily   phenytoin  200 mg Oral QHS   tamsulosin  0.4 mg Oral Daily   Continuous Infusions:  sodium chloride Stopped (10/13/22 1101)   cefTRIAXone (ROCEPHIN)  IV Stopped (10/14/22 2348)   lactated ringers 100 mL/hr at 10/15/22 0612   PRN Meds:.docusate sodium, mouth rinse, polyethylene glycol  HPI:    [name] is a 67 y.o. male with past medical history as noted below who was admitted 10/11/2022 from his skilled nursing facility with hematuria and encephalopathy.  He was found to be  febrile and septic requiring vasopressor support.  He was initially admitted to the ICU but improved on IV antibiotics and discharged to the floor.  He was found to have positive blood and urine cultures for Providencia stuartii and Proteus mirabilis.  He is currently on ceftriaxone.  His leukocytosis and fever curve has improved.  He had an ultrasound that showed normal kidneys as well as a Foley catheter in the bladder.  The Foley catheter has since been removed.  Patient was seen this morning and denies any fevers or chills.  He reports that he needs to urinate.  He reports no abdominal pain or diarrhea.   Past Medical History:  Diagnosis Date   AAA (abdominal aortic aneurysm) (HCC)    Alcohol abuse    h/o, quit april 5th, 2013   Anxiety    Aortic aneurysm (HCC)    Blood transfusion    Cardiomegaly    EF 25-30 % in 2005, EF 50-55% Oct 2013   Depression    Hyperlipidemia    Hypertension    Lumbar herniated disc    Memory deficits    Radiculopathy    Seizures (HCC)    last seizure several years ago- per sister 28 or below at [facility] 07-12-17   TBI (traumatic brain injury) [facility])    Sept 1988   Urinary urgency     Social History   Tobacco Use   Smoking status: Former    Packs/day: .1  Types: Cigarettes    Passive exposure: Never   Smokeless tobacco: Never   Tobacco comments:    "as much as he can"- per [facility] at [facility] 07/01/19  Vaping Use   Vaping Use: Never used  Substance Use Topics   Alcohol use: No    Alcohol/week: 0.0 standard drinks of alcohol    Comment: per sister, last drink was apr 5th--2013 hx of ETOH   Drug use: No    Family History  Problem Relation Age of Onset   Liver disease Mother    Hypertension Mother    Breast cancer Maternal Aunt    Esophageal cancer Cousin    Esophageal cancer Maternal Uncle    Colon cancer Neg Hx    Stomach cancer Neg Hx    Colon polyps Neg Hx    Rectal cancer Neg Hx     Allergies  Allergen Reactions   Isovue  [Iopamidol] Hives    Pt broke out in one hive on his chest after contrast on 01/03/15.  Pt will need full premeds in the future per Dr [place].      Review of Systems  All other systems reviewed and are negative.   OBJECTIVE:   Blood pressure (!) 158/70, pulse 94, temperature 99.6 F (37.6 C), temperature source Oral, resp. rate 16, height 5\' 8"  (1.727 m), weight 39.7 kg, SpO2 94 %. Body mass index is 13.31 kg/m.  Physical Exam Constitutional:      General: He is not in acute distress. HENT:     Head: Normocephalic and atraumatic.  Eyes:     Extraocular Movements: Extraocular movements intact.     Conjunctiva/sclera: Conjunctivae normal.  Cardiovascular:     Rate and Rhythm: Normal rate and regular rhythm.  Pulmonary:     Effort: Pulmonary effort is normal. No respiratory distress.  Abdominal:     General: There is no distension.     Palpations: Abdomen is soft.  Musculoskeletal:     Cervical back: Normal range of motion and neck supple.     Comments: Bilateral amputations above the knee  Skin:    General: Skin is warm and dry.  Neurological:     General: No focal deficit present.     Mental Status: He is alert.     Cranial Nerves: No cranial nerve deficit.  Psychiatric:        Mood and Affect: Mood normal.        Behavior: Behavior normal.      Lab Results: Lab Results  Component Value Date   WBC 8.7 10/15/2022   HGB 9.0 (L) 10/15/2022   HCT 27.6 (L) 10/15/2022   MCV 84.9 10/15/2022   PLT 175 10/15/2022    Lab Results  Component Value Date   NA 132 (L) 10/15/2022   K 4.0 10/15/2022   CO2 23 10/15/2022   GLUCOSE 99 10/15/2022   BUN 6 (L) 10/15/2022   CREATININE 0.68 10/15/2022   CALCIUM 7.9 (L) 10/15/2022   GFRNONAA >60 10/15/2022   GFRAA >60 11/16/2019    Lab Results  Component Value Date   ALT 20 10/11/2022   AST 33 10/11/2022   ALKPHOS 195 (H) 10/11/2022   BILITOT 0.5 10/11/2022       Component Value Date/Time   CRP 17.4 (H) 06/15/2022  1056       Component Value Date/Time   ESRSEDRATE 100 (H) 06/15/2022 1056    I have reviewed the micro and lab results in Epic.  Imaging: No  results found.   Imaging  independently reviewed in Epic.  Vedia Coffer for Infectious Disease [facility] [phone] pager 10/15/2022, 11:14 AM

## 2022-10-15 NOTE — Progress Notes (Addendum)
PROGRESS NOTE    [name]  ZOX:[id number] DOB: [birth year] DOA: 10/11/2022 PCP: Karna Dupes, MD    Brief Narrative:  Patient is a 67 year old male with past medical history of TBI, AAA repair, hypertension and hyperlipidemia was brought in from skilled nursing facility with complaints of altered mental status and hematuria.  In the ED, patient was febrile volume depleted with the low blood pressure and received IV fluids.  Patient was started on vasopressor and was admitted to the ICU.  Subsequently, vasopressors were discontinued. patient was noted to have Proteus UTI.  Patient was subsequently considered stable for transfer out of the ICU.  Assessment and plan   Septic shock -Proteus/providencia bacteremia Acute urinary tract infection Patient was initially on IV Rocephin.  Sensitive to cephalosporin Zosyn but resistant to ampicillin and Unasyn. Blood cultures positive for Proteus and providencia..  Urine culture with Proteus and Providencia.  Renal ultrasound on 10/13/2022 with Foley catheter in place.  ID was consulted and plan is to transition to ciprofloxacin until 10/18/2022.  [facility] has seen [facility] in the past and encouraged to keep that appointment.  There is concern for prostatic enlargement.  Acute kidney injury due to severe dehydration-improved after hydration.  Latest creatinine at 0.6.  Has a Foley catheter in place.  Will discontinue Foley catheter.  Hypokalemia hypophosphatemia and hypomagnesemia  has been replenished and improved.   Gross hematuria likely due to traumatic Foley catheter placement Continue tamsulosin.  Urine is clearing up.  Will try Foley catheter removal and voiding trial today.  Acute on chronic blood loss anemia  Baseline hemoglobin of 9.0.  Hemoglobin today at 9.0.  Acute septic encephalopathy-resolved History of TBI/dementia On extended release phenytoin.  Will continue.  Essential hypertension.  Will resume atenolol from  home.   AAA repair/PAD Continue aspirin.  Will restart Plavix.  Continue statins.   DVT prophylaxis: enoxaparin (LOVENOX) injection 30 mg Start: 10/14/22 2200 SCDs Start: 10/12/22 0719   Code Status:     Code Status: Full Code  Disposition:  Skilled nursing facility likely on 10/16/2022.    Status is: Inpatient  Remains inpatient appropriate because: IV antibiotics, bacteremia, skin nursing facility placement.   Family Communication:  Spoke with the patient's sister on the phone and updated her about the clinical condition of the patient.  Consultants:  PCCM Infectious disease.  Procedures:  Foley catheter placement and removal  Antimicrobials:  Rocephin IV  Anti-infectives (From admission, onward)    Start     Dose/Rate Route Frequency Ordered Stop   10/15/22 2000  ciprofloxacin (CIPRO) tablet 500 mg        500 mg Oral 2 times daily 10/15/22 1149 10/19/22 0759   10/13/22 1000  vancomycin (VANCOREADY) IVPB 500 mg/100 mL  Status:  Discontinued        500 mg 100 mL/hr over 60 Minutes Intravenous Every 48 hours 10/12/22 0139 10/12/22 1444   10/12/22 2200  ceFEPIme (MAXIPIME) 2 g in sodium chloride 0.9 % 100 mL IVPB  Status:  Discontinued        2 g 200 mL/hr over 30 Minutes Intravenous Every 24 hours 10/12/22 0139 10/12/22 0719   10/12/22 2200  cefTRIAXone (ROCEPHIN) 2 g in sodium chloride 0.9 % 100 mL IVPB  Status:  Discontinued        2 g 200 mL/hr over 30 Minutes Intravenous Every 24 hours 10/12/22 0719 10/15/22 1149   10/11/22 2330  vancomycin (VANCOREADY) IVPB 750 mg/150 mL  750 mg 150 mL/hr over 60 Minutes Intravenous  Once 10/11/22 2256 10/12/22 0231   10/11/22 2330  ceFEPIme (MAXIPIME) 2 g in sodium chloride 0.9 % 100 mL IVPB        2 g 200 mL/hr over 30 Minutes Intravenous  Once 10/11/22 2256 10/12/22 0117   10/11/22 2230  metroNIDAZOLE (FLAGYL) IVPB 500 mg        500 mg 100 mL/hr over 60 Minutes Intravenous  Once 10/11/22 2219 10/12/22 0117       Subjective:  Today, patient was seen and examined with denies any fever, chills, nausea, vomiting, abdominal pain.  Plan for voiding trial today.    Objective: Vitals:   10/14/22 2033 10/15/22 0500 10/15/22 0513 10/15/22 0735  BP: 135/75  (!) 151/75 (!) 158/70  Pulse: 99   94  Resp: Temp: 98.9 F (37.2 C)  98.1 F (36.7 C) 99.6 F (37.6 C)  TempSrc: Oral   Oral  SpO2: 95%   94%  Weight:  39.7 kg    Height:        Intake/Output Summary (Last 24 hours) at 10/15/2022 1240 Last data filed at 10/15/2022 1109 Gross per 24 hour  Intake 1935.92 ml  Output 2900 ml  Net -964.08 ml    Filed Weights   10/13/22 0500 10/14/22 0500 10/15/22 0500  Weight: 41.2 kg 41.7 kg 39.7 kg    Physical Examination: Body mass index is 13.31 kg/m.   General: Alert awake and Communicative, thinly built, HENT:   Mild pallor noted. Oral mucosa is moist.  Chest:  Clear breath sounds.   No crackles or wheezes.  CVS: S1 &S2 heard. No murmur.  Regular rate and rhythm. Abdomen: Soft, nontender, nondistended.  Bowel sounds are heard.  Foley catheter in place.   Extremities:  Bilateral above-knee amputation. Psych: Alert, awake and oriented, flat affect. CNS:  No cranial nerve deficits.  Moves upper extremities. Skin: Warm and dry.   Data Reviewed:   CBC: Recent Labs  Lab 10/11/22 2240 10/12/22 0605 10/13/22 0020 10/14/22 0323 10/15/22 0403  WBC 4.8 22.0* 18.1* 12.4* 8.7  NEUTROABS 4.4 19.0*  --  9.1*  --   HGB 8.8* 7.7* 8.4* 8.7* 9.0*  HCT 28.3* 23.7* 25.9* 25.8* 27.6*  MCV 88.7 87.8 86.6 82.7 84.9  PLT 152 131* 152 157 175     Basic Metabolic Panel: Recent Labs  Lab 10/11/22 2240 10/13/22 0020 10/14/22 0323 10/15/22 0403  NA 136 137 132* 132*  K 3.6 3.2* 3.6 4.0  CL 102 105 103 102  CO2 GLUCOSE 96 91 129* 99  BUN 29* 17 7* 6*  CREATININE 1.23 0.84 0.69 0.68  CALCIUM 8.4* 8.0* 7.7* 7.9*  MG  --  1.5* 2.2 1.9  PHOS  --  1.5* 2.9  --      Liver  Function Tests: Recent Labs  Lab 10/11/22 2240  AST 33  ALT 20  ALKPHOS 195*  BILITOT 0.5  PROT 6.0*  ALBUMIN 2.8*      Radiology Studies: No results found.    LOS: 3 days     Joycelyn Das, MD [facility] Available via Epic secure chat 7am-7pm After these hours, please refer to coverage provider listed on [email or web address] 10/15/2022, 12:40 PM

## 2022-10-15 NOTE — Progress Notes (Signed)
PT Cancellation Note  Patient Details Name: [name] MRN: [id number] DOB: 1955/09/09   Cancelled Treatment:    Reason Eval/Treat Not Completed: PT screened, no needs identified, will sign off  Chart reviewed.  Noted last admission 05/2022 pt from long term care and with contractures/bed bound.  Pt with hx of bil AKA and still from long term care.  Pt bed bound/hoyer level and is at baseline.  No acute PT needs.   Anise Salvo, PT Acute Rehab [facility] [phone]  Rayetta Humphrey 10/15/2022, 10:21 AM

## 2022-10-16 DIAGNOSIS — F0390 Unspecified dementia without behavioral disturbance: Secondary | ICD-10-CM | POA: Diagnosis not present

## 2022-10-16 DIAGNOSIS — A419 Sepsis, unspecified organism: Secondary | ICD-10-CM | POA: Diagnosis not present

## 2022-10-16 DIAGNOSIS — N179 Acute kidney failure, unspecified: Secondary | ICD-10-CM | POA: Diagnosis not present

## 2022-10-16 DIAGNOSIS — I10 Essential (primary) hypertension: Secondary | ICD-10-CM | POA: Diagnosis not present

## 2022-10-16 LAB — CBC
HCT: 31.9 % — ABNORMAL LOW (ref 39.0–52.0)
Hemoglobin: 10.2 g/dL — ABNORMAL LOW (ref 13.0–17.0)
MCH: 27.4 pg (ref 26.0–34.0)
MCHC: 32 g/dL (ref 30.0–36.0)
MCV: 85.8 fL (ref 80.0–100.0)
Platelets: 200 10*3/uL (ref 150–400)
RBC: 3.72 MIL/uL — ABNORMAL LOW (ref 4.22–5.81)
RDW: 15.2 % (ref 11.5–15.5)
WBC: 8.1 10*3/uL (ref 4.0–10.5)
nRBC: 0 % (ref 0.0–0.2)

## 2022-10-16 LAB — BASIC METABOLIC PANEL
Anion gap: 8 (ref 5–15)
BUN: 6 mg/dL — ABNORMAL LOW (ref 8–23)
CO2: 25 mmol/L (ref 22–32)
Calcium: 8.7 mg/dL — ABNORMAL LOW (ref 8.9–10.3)
Chloride: 101 mmol/L (ref 98–111)
Creatinine, Ser: 0.72 mg/dL (ref 0.61–1.24)
GFR, Estimated: >60 mL/min (ref >60)
Glucose, Bld: 88 mg/dL (ref 70–99)
Potassium: 3.8 mmol/L (ref 3.5–5.1)
Sodium: 134 mmol/L — ABNORMAL LOW (ref 135–145)

## 2022-10-16 LAB — MAGNESIUM: Magnesium: 2 mg/dL (ref 1.7–2.4)

## 2022-10-16 MED ORDER — ENSURE ENLIVE PO LIQD
237.0000 mL | Freq: Two times a day (BID) | ORAL | Status: DC
  Administered 2022-10-16 (×2): 237 mL via ORAL

## 2022-10-16 MED ORDER — PHENYTOIN SODIUM EXTENDED 100 MG PO CAPS
100.0000 mg | ORAL_CAPSULE | Freq: Every day | ORAL | Status: DC
  Administered 2022-10-16: 100 mg via ORAL
  Filled 2022-10-16: qty 1

## 2022-10-16 MED ORDER — CIPROFLOXACIN HCL 500 MG PO TABS: 500.0000 mg | ORAL_TABLET | Freq: Two times a day (BID) | ORAL | 0 refills | Status: AC

## 2022-10-16 MED ORDER — POLYETHYLENE GLYCOL 3350 17 G PO PACK: 17.0000 g | PACK | Freq: Every day | ORAL | Status: AC | PRN

## 2022-10-16 MED ORDER — TAMSULOSIN HCL 0.4 MG PO CAPS: 0.4000 mg | ORAL_CAPSULE | Freq: Every day | ORAL | Status: AC

## 2022-10-16 NOTE — Evaluation (Signed)
Occupational Therapy Evaluation Patient Details Name: [name] MRN: [id number] DOB: [birth year] Today's Date: 10/16/2022   History of Present Illness [name] is a 67 y.o. male admitted 10/11/2022 from his skilled nursing facility with hematuria and encephalopathy.  He was found to be febrile and septic requiring vasopressor support.  He was initially admitted to the ICU but improved on IV antibiotics and discharged to the floor. 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   Clinical Impression   Per chart, pt was bed bound at baseline. Pt reports he was able to complete grooming and toileting after setup assistance. Pt with TBI and cognitive limitations at baseline. Pt currently functioning at baseline. Patient evaluated by Occupational Therapy with no further acute OT needs identified. All education has been completed and the patient has no further questions. See below for any follow-up Occupational Therapy or equipment needs. OT to sign off. Thank you for referral.        Recommendations for follow up therapy are one component of a multi-disciplinary discharge planning process, led by the attending physician.  Recommendations may be updated based on patient status, additional functional criteria and insurance authorization.   Assistance Recommended at Discharge Frequent or constant Supervision/Assistance  Patient can return home with the following A lot of help with walking and/or transfers;A lot of help with bathing/dressing/bathroom    Functional Status Assessment  Patient has not had a recent decline in their functional status  Equipment Recommendations       Recommendations for Other Services       Precautions / Restrictions Precautions Precautions: Fall (bilateral AKA)      Mobility Bed Mobility Overal bed mobility: Needs Assistance              General bed mobility comments: pt assisted with RUE on bed frame to assist with repositioning in bed    Transfers                   General transfer comment: dependent      Balance                                           ADL either performed or assessed with clinical judgement   ADL Overall ADL's : Needs assistance/impaired                                       General ADL Comments: pt requiring setup assistance for bed level grooming, eating and urination with urinal. pt would require assistance with placement of bedpan for BM     Vision         Perception     Praxis      Pertinent Vitals/Pain Pain Assessment Pain Assessment: No/denies pain     Hand Dominance Right   Extremity/Trunk Assessment Upper Extremity Assessment Upper Extremity Assessment: Generalized weakness;LUE deficits/detail;RUE deficits/detail (grossly 3/5) RUE Deficits / Details: able to use functionally, grossly 3+/5 LUE Deficits / Details: pain in shoulder with external rotation. Shoulder flexion ROM ~150*, MMT 3+/5 grossly. limited assessement 2/2 pain   Lower Extremity Assessment Lower Extremity Assessment:  (bilateral AKA)       Communication  Cognition Arousal/Alertness: Awake/alert Behavior During Therapy: WFL for tasks assessed/performed Overall Cognitive Status: History of cognitive impairments - at baseline                                 General Comments: pt with hx of TBI, likely at baseline. Pt oriented to self. not oriented to date (stated may) and year (stated 2023).     General Comments  vss    Exercises     Shoulder Instructions      Home Living Family/patient expects to be discharged to:: Skilled nursing facility                                 Additional Comments: [facility] per chart      Prior Functioning/Environment Prior Level of Function : Patient poor historian/Family  not available             Mobility Comments: per chart, pt is bed bound at baseline ADLs Comments: pt reports he is able to complete toileting after setup with use of urinal, reports independence with bed level ADL after setup.        OT Problem List: Decreased cognition      OT Treatment/Interventions:      OT Goals(Current goals can be found in the care plan section) Acute Rehab OT Goals Patient Stated Goal: to go back to facility OT Goal Formulation: With patient Time For Goal Achievement: 10/30/22 Potential to Achieve Goals: Good  OT Frequency:      Co-evaluation              AM-PAC OT "6 Clicks" Daily Activity     Outcome Measure Help from another person eating meals?: A Little Help from another person taking care of personal grooming?: A Little Help from another person toileting, which includes using toliet, bedpan, or urinal?: A Lot Help from another person bathing (including washing, rinsing, drying)?: A Lot Help from another person to put on and taking off regular upper body clothing?: A Little Help from another person to put on and taking off regular lower body clothing?: A Little 6 Click Score: 16   End of Session Nurse Communication: Mobility status  Activity Tolerance: Patient tolerated treatment well Patient left: in bed;with call bell/phone within reach;with bed alarm set  OT Visit Diagnosis: Other abnormalities of gait and mobility (R26.89);Other symptoms and signs involving cognitive function                Time: 1610-9604 OT Time Calculation (min): 9 min Charges:  OT General Charges $OT Visit: 1 Visit OT Evaluation $OT Eval Low Complexity: 1 Low  Yuki Purves OTR/L Acute Rehabilitation Services Office: [phone]   Rebeca Alert 10/16/2022, 10:13 AM

## 2022-10-16 NOTE — Progress Notes (Signed)
Patient's sister [name] made aware about his discharge to [facility].

## 2022-10-16 NOTE — TOC Transition Note (Signed)
Transition of Care [facility]) - CM/SW Discharge Note   Patient Details  Name: [name] MRN: [id number] Date of Birth: May 08, 1956  [facility] CM/SW Contact:  Korynne Dols A [place], LCSWA Phone Number: 10/16/2022, 10:52 AM   Clinical Narrative:     [facility] will [facility] to: [facility]  Anticipated DC date:  10/16/22  Family notified: Jinny Sanders  Transport by: Sharin Mons      Per MD patient ready for DC to  [place]. RN, patient, patient's family, and facility notified of DC. Discharge Summary and FL2 sent to facility. RN to call report prior to discharge ( Report # [phone], bed # 106B). DC packet on chart. Ambulance transport requested for patient.     CSW will sign off for now as social work intervention is no longer needed. Please consult [place] again if new needs arise.   Final next level of care: Skilled Nursing Facility Barriers to Discharge: No Barriers Identified   Patient Goals and CMS Choice      Discharge Placement                Patient chooses bed at: [facility] Patient to be transferred to facility by: [facility] Name of family member notified: Jinny Sanders Patient and family notified of of transfer: 10/16/22  Discharge Plan and Services Additional resources added to the After Visit Summary for                                       Social Determinants of Health (SDOH) Interventions SDOH Screenings   Food Insecurity: No Food Insecurity (07/15/2022)  Housing: Low Risk  (07/15/2022)  Transportation Needs: No Transportation Needs (07/15/2022)  Utilities: Not At Risk (07/15/2022)  Tobacco Use: Medium Risk (08/20/2022)     Readmission Risk Interventions    06/15/2022   11:08 AM 02/14/2022   12:07 PM  Readmission Risk Prevention Plan  Transportation Screening Complete Complete  PCP or Specialist Appt within 5-7 Days Complete   PCP or Specialist Appt within 3-5 Days  Complete  Home Care Screening Complete   Medication Review (RN  CM) Complete   HRI or Home Care Consult  Complete  Social Work Consult for Recovery Care Planning/Counseling  Complete  Palliative Care Screening  Complete  Medication Review Oceanographer)  Complete

## 2022-10-16 NOTE — Plan of Care (Signed)
  Problem: Education: Goal: Knowledge of General Education information will improve Description: Including pain rating scale, medication(s)/side effects and non-pharmacologic comfort measures Outcome: Not Progressing   Problem: Health Behavior/Discharge Planning: Goal: Ability to manage health-related needs will improve Outcome: Not Progressing   

## 2022-10-16 NOTE — Discharge Summary (Signed)
Physician Discharge Summary  [name] ZOX:[id number] DOB: 1956/04/15 DOA: 10/11/2022  PCP: Karna Dupes, MD  Admit date: 10/11/2022 Discharge date: 10/16/2022  Admitted From: Skilled facility  Discharge disposition: Skilled nursing facility  Recommendations for Outpatient Follow-Up:   Follow up with your primary care provider at the skilled nursing facility in 3 to 5 days Check CBC, BMP, magnesium in the next visit Follow-up with urology as outpatient.  Discharge Diagnosis:   Principal Problem:   Severe sepsis Active Problems:   Hyponatremia   Hypertension   TBI (traumatic brain injury)   Dementia without behavioral disturbance   AKI (acute kidney injury)   Bacteremia   Sepsis with acute renal failure and septic shock  Discharge Condition: Improved.  Diet recommendation: Low sodium, heart healthy.   Wound care: None.  Code status: Full.   History of Present Illness:   Patient is a 66 year old male with past medical history of TBI, AAA repair, hypertension and hyperlipidemia was brought in from skilled nursing facility with complaints of altered mental status and hematuria. In the ED, patient was febrile volume depleted with the low blood pressure and received IV fluids. Patient was started on vasopressor and was admitted to the ICU. Subsequently, vasopressors were discontinued. patient was noted to have Proteus UTI. Patient was subsequently considered stable for transfer out of the ICU.   Hospital Course:   Following conditions were addressed during hospitalization as listed below,  Septic shock -Proteus/providencia bacteremia Acute urinary tract infection Patient was initially on IV Rocephin.  Sensitive to cephalosporin Zosyn but resistant to ampicillin and Unasyn. Blood cultures positive for Proteus and providencia..  Urine culture with Proteus and Providencia.  Renal ultrasound on 10/13/2022 with Foley catheter in place.  ID was consulted and plan is  to transition to ciprofloxacin until 10/18/2022.  [facility] has seen [facility] in [facility] past and encouraged to keep that appointment.  There is concern for prostatic enlargement.  At this time patient has been discontinued from Foley catheter.   Acute kidney injury due to severe dehydration-improved after hydration.  Latest creatinine at 0.7.     Hypokalemia hypophosphatemia and hypomagnesemia  has been replenished and improved.   Gross hematuria likely due to traumatic Foley catheter placement Continue tamsulosin.  Hematuria has cleared.  Of Foley catheter.    Acute on chronic blood loss anemia  Baseline hemoglobin of 9.0.  Hemoglobin today at 10.2.   Acute septic encephalopathy-resolved History of TBI/dementia On extended release phenytoin.  Will continue.  Free phenytoin level was elevated so dosing was adjusted.   Essential hypertension.  Thinly atenolol   AAA repair/PAD Continue aspirin, Plavix, statins.   Disposition.  At this time, patient is stable for disposition to skilled nursing facility.  Spoke with the patient's daughter on the phone for 20-24  Medical Consultants:   Infectious disease PCCM  Procedures:    Foley catheter placement and removal Subjective:   Today, patient was seen and examined at bedside.  No interval complaints reported.  Was able to urinate by himself.  Discharge Exam:   Vitals:   10/16/22 0521 10/16/22 0811  BP: 132/87 136/75  Pulse: 80 87  Resp: 16 18  Temp: 98.3 F (36.8 C) (!) 97.5 F (36.4 C)  SpO2: 100% 93%   Vitals:   10/15/22 2324 10/16/22 0500 10/16/22 0521 10/16/22 0811  BP: (!) 151/97  132/87 136/75  Pulse: 79  80 87  Resp: 15  16 18   Temp: 98.2 F (36.8 C)  98.3 F (36.8 C) (!) 97.5 F (36.4 C)  TempSrc: Oral  Oral Oral  SpO2: 99%  100% 93%  Weight:  35.9 kg    Height:       Body mass index is 12.03 kg/m.  General: Alert awake, not in obvious distress, thinly built, HENT: pupils equally reacting to light,  mild pallor noted.  Oral mucosa is moist.  Chest:  Clear breath sounds.  Diminished breath sounds bilaterally. No crackles or wheezes.  CVS: S1 &S2 heard. No murmur.  Regular rate and rhythm. Abdomen: Soft, nontender, nondistended.  Bowel sounds are heard.   Extremities: Bilateral above-knee amputation, Psych: Alert, awake and Communicative, flat affect, CNS:  No cranial nerve deficits.  Moves upper extremities. Skin: Warm and dry.  No rashes noted.  The results of significant diagnostics from this hospitalization (including imaging, microbiology, ancillary and laboratory) are listed below for reference.     Diagnostic Studies:   DG Chest Port 1 View  Result Date: 10/11/2022 CLINICAL DATA:  Questionable sepsis. Evaluate for acute abnormality. EXAM: PORTABLE CHEST 1 VIEW COMPARISON:  PA Lat 07/14/2022 FINDINGS: Arch and descending thoracic aortic stent graft is again noted. The cardiac size normal. The lungs are hypoexpanded. There is increased linear atelectasis in the bases. No focal pneumonia is evident, but with limited view of the bases. The mediastinal configuration is stable. Multiple overlying monitor wires.  Thoracic cage is intact. IMPRESSION: 1. Low lung volumes with increased linear atelectasis in the bases. No focal pneumonia is evident. 2. Aortic stent graft. Electronically Signed   By: Almira Bar M.D.   On: 10/11/2022 23:14     Labs:   Basic Metabolic Panel: Recent Labs  Lab 10/11/22 2240 10/13/22 0020 10/14/22 0323 10/15/22 0403 10/16/22 0446  NA 136 137 132* 132* 134*  K 3.6 3.2* 3.6 4.0 3.8  CL 102 105 103 102 101  CO2 23 23 23 23 25   GLUCOSE 96 91 129* 99 88  BUN 29* 17 7* 6* 6*  CREATININE 1.23 0.84 0.69 0.68 0.72  CALCIUM 8.4* 8.0* 7.7* 7.9* 8.7*  MG  --  1.5* 2.2 1.9 2.0  PHOS  --  1.5* 2.9  --   --    GFR Estimated Creatinine Clearance: 46.7 mL/min (by C-G formula based on SCr of 0.72 mg/dL). Liver Function Tests: Recent Labs  Lab 10/11/22 2240   AST 33  ALT 20  ALKPHOS 195*  BILITOT 0.5  PROT 6.0*  ALBUMIN 2.8*   No results for input(s): "LIPASE", "AMYLASE" in the last 168 hours. No results for input(s): "AMMONIA" in the last 168 hours. Coagulation profile Recent Labs  Lab 10/11/22 2240  INR 1.3*    CBC: Recent Labs  Lab 10/11/22 2240 10/12/22 0605 10/13/22 0020 10/14/22 0323 10/15/22 0403 10/16/22 0446  WBC 4.8 22.0* 18.1* 12.4* 8.7 8.1  NEUTROABS 4.4 19.0*  --  9.1*  --   --   HGB 8.8* 7.7* 8.4* 8.7* 9.0* 10.2*  HCT 28.3* 23.7* 25.9* 25.8* 27.6* 31.9*  MCV 88.7 87.8 86.6 82.7 84.9 85.8  PLT 152 131* 152 157 175 200   Cardiac Enzymes: Recent Labs  Lab 10/12/22 0209 10/13/22 0016  CKTOTAL 1,106* 2,049*   BNP: Invalid input(s): "POCBNP" CBG: Recent Labs  Lab 10/12/22 2316 10/13/22 0800 10/13/22 1123 10/14/22 1150 10/15/22 1151  GLUCAP 89 105* 104* 123* 96   D-Dimer No results for input(s): "DDIMER" in the last 72 hours. Hgb A1c No results for input(s): "HGBA1C" in the last  72 hours. Lipid Profile No results for input(s): "CHOL", "HDL", "LDLCALC", "TRIG", "CHOLHDL", "LDLDIRECT" in the last 72 hours. Thyroid function studies No results for input(s): "TSH", "T4TOTAL", "T3FREE", "THYROIDAB" in the last 72 hours.  Invalid input(s): "FREET3" Anemia work up No results for input(s): "VITAMINB12", "FOLATE", "FERRITIN", "TIBC", "IRON", "RETICCTPCT" in the last 72 hours. Microbiology Recent Results (from the past 240 hour(s))  Resp panel by RT-PCR (RSV, Flu A&B, Covid) Anterior Nasal Swab     Status: None   Collection Time: 10/11/22 10:19 PM   Specimen: Anterior Nasal Swab  Result Value Ref Range Status   SARS Coronavirus 2 by RT PCR NEGATIVE NEGATIVE Final   Influenza A by PCR NEGATIVE NEGATIVE Final   Influenza B by PCR NEGATIVE NEGATIVE Final    Comment: (NOTE) The Xpert Xpress SARS-CoV-2/FLU/RSV plus assay is intended as an aid in the diagnosis of influenza from Nasopharyngeal swab  specimens and should not be used as a sole basis for treatment. Nasal washings and aspirates are unacceptable for Xpert Xpress SARS-CoV-2/FLU/RSV testing.  Fact Sheet for Patients: BloggerCourse.com  Fact Sheet for Healthcare Providers: SeriousBroker.it  This test is not yet approved or cleared by the [place] FDA and has been authorized for detection and/or diagnosis of SARS-CoV-2 by FDA under an Emergency Use Authorization (EUA). This EUA will remain in effect (meaning this test can be used) for the duration of the COVID-19 declaration under Section 564(b)(1) of the Act, 21 U.S.C. section 360bbb-3(b)(1), unless the authorization is terminated or revoked.     Resp Syncytial Virus by PCR NEGATIVE NEGATIVE Final    Comment: (NOTE) Fact Sheet for Patients: BloggerCourse.com  Fact Sheet for Healthcare Providers: SeriousBroker.it  This test is not yet approved or cleared by the [place] FDA and has been authorized for detection and/or diagnosis of SARS-CoV-2 by FDA under an Emergency Use Authorization (EUA). This EUA will remain in effect (meaning this test can be used) for the duration of the COVID-19 declaration under Section 564(b)(1) of the Act, 21 U.S.C. section 360bbb-3(b)(1), unless the authorization is terminated or revoked.  Performed at [facility], [address], [place], [place] [address]   Remove and replace urinary cath (placed > 5 days) then obtain urine culture from new indwelling urinary catheter.     Status: Abnormal   Collection Time: 10/11/22 10:20 PM   Specimen: Urine, Catheterized  Result Value Ref Range Status   Specimen Description URINE, CATHETERIZED  Final   Special Requests   Final    NONE Performed at [facility], [address] [place], [place] [address]    Culture (A)  Final    >=100,000 COLONIES/mL PROTEUS  MIRABILIS >=100,000 COLONIES/mL PROVIDENCIA STUARTII    Report Status 10/14/2022 FINAL  Final   Organism ID, Bacteria PROTEUS MIRABILIS (A)  Final   Organism ID, Bacteria PROVIDENCIA STUARTII (A)  Final      Susceptibility   Proteus mirabilis - MIC*    AMPICILLIN <=2 SENSITIVE Sensitive     CEFAZOLIN <=4 SENSITIVE Sensitive     CEFEPIME <=0.12 SENSITIVE Sensitive     CEFTRIAXONE <=0.25 SENSITIVE Sensitive     CIPROFLOXACIN <=0.25 SENSITIVE Sensitive     GENTAMICIN <=1 SENSITIVE Sensitive     IMIPENEM 2 SENSITIVE Sensitive     NITROFURANTOIN RESISTANT Resistant     TRIMETH/SULFA <=20 SENSITIVE Sensitive     AMPICILLIN/SULBACTAM <=2 SENSITIVE Sensitive     PIP/TAZO <=4 SENSITIVE Sensitive     * >=100,000 COLONIES/mL  PROTEUS MIRABILIS   Providencia stuartii - MIC*    AMPICILLIN RESISTANT Resistant     CEFEPIME <=0.12 SENSITIVE Sensitive     CEFTRIAXONE <=0.25 SENSITIVE Sensitive     CIPROFLOXACIN <=0.25 SENSITIVE Sensitive     GENTAMICIN RESISTANT Resistant     IMIPENEM 1 SENSITIVE Sensitive     NITROFURANTOIN 256 RESISTANT Resistant     TRIMETH/SULFA <=20 SENSITIVE Sensitive     AMPICILLIN/SULBACTAM 16 INTERMEDIATE Intermediate     PIP/TAZO <=4 SENSITIVE Sensitive     * >=100,000 COLONIES/mL PROVIDENCIA STUARTII  Blood Culture (routine x 2)     Status: Abnormal   Collection Time: 10/11/22 10:24 PM   Specimen: BLOOD  Result Value Ref Range Status   Specimen Description BLOOD SITE NOT SPECIFIED  Final   Special Requests   Final    BOTTLES DRAWN AEROBIC AND ANAEROBIC Blood Culture adequate volume   Culture  Setup Time   Final    GRAM NEGATIVE RODS IN BOTH AEROBIC AND ANAEROBIC BOTTLES CRITICAL RESULT CALLED TO, READ BACK BY AND VERIFIED WITH: A. PAYTES ON 10/12/22 @ 1429 BY ADL Performed at [facility], [address], [place], [place] [address]    Culture PROVIDENCIA STUARTII PROTEUS MIRABILIS  (A)  Final   Report Status 10/15/2022 FINAL  Final   Organism ID,  Bacteria PROVIDENCIA STUARTII  Final   Organism ID, Bacteria PROTEUS MIRABILIS  Final      Susceptibility   Proteus mirabilis - MIC*    AMPICILLIN <=2 SENSITIVE Sensitive     CEFEPIME <=0.12 SENSITIVE Sensitive     CEFTAZIDIME <=1 SENSITIVE Sensitive     CEFTRIAXONE <=0.25 SENSITIVE Sensitive     CIPROFLOXACIN <=0.25 SENSITIVE Sensitive     GENTAMICIN <=1 SENSITIVE Sensitive     IMIPENEM 2 SENSITIVE Sensitive     TRIMETH/SULFA <=20 SENSITIVE Sensitive     AMPICILLIN/SULBACTAM <=2 SENSITIVE Sensitive     PIP/TAZO <=4 SENSITIVE Sensitive     * PROTEUS MIRABILIS   Providencia stuartii - MIC*    AMPICILLIN RESISTANT Resistant     CEFEPIME <=0.12 SENSITIVE Sensitive     CEFTAZIDIME <=1 SENSITIVE Sensitive     CEFTRIAXONE <=0.25 SENSITIVE Sensitive     CIPROFLOXACIN <=0.25 SENSITIVE Sensitive     GENTAMICIN RESISTANT Resistant     IMIPENEM 1 SENSITIVE Sensitive     TRIMETH/SULFA <=20 SENSITIVE Sensitive     AMPICILLIN/SULBACTAM 16 INTERMEDIATE Intermediate     PIP/TAZO <=4 SENSITIVE Sensitive     * PROVIDENCIA STUARTII  Blood Culture ID Panel (Reflexed)     Status: Abnormal   Collection Time: 10/11/22 10:24 PM  Result Value Ref Range Status   Enterococcus faecalis NOT DETECTED NOT DETECTED Final   Enterococcus Faecium NOT DETECTED NOT DETECTED Final   Listeria monocytogenes NOT DETECTED NOT DETECTED Final   Staphylococcus species NOT DETECTED NOT DETECTED Final   Staphylococcus aureus (BCID) NOT DETECTED NOT DETECTED Final   Staphylococcus epidermidis NOT DETECTED NOT DETECTED Final   Staphylococcus lugdunensis NOT DETECTED NOT DETECTED Final   Streptococcus species NOT DETECTED NOT DETECTED Final   Streptococcus agalactiae NOT DETECTED NOT DETECTED Final   Streptococcus pneumoniae NOT DETECTED NOT DETECTED Final   Streptococcus pyogenes NOT DETECTED NOT DETECTED Final   A.calcoaceticus-baumannii NOT DETECTED NOT DETECTED Final   Bacteroides fragilis NOT DETECTED NOT DETECTED  Final   Enterobacterales DETECTED (A) NOT DETECTED Final    Comment: Enterobacterales represent a large order of gram negative bacteria, not a single organism. CRITICAL  RESULT CALLED TO, READ BACK BY AND VERIFIED WITH:  C/ PHARMD A. PAYTES 10/12/22 1429 A. LAFRANCE    Enterobacter cloacae complex NOT DETECTED NOT DETECTED Final   Escherichia coli NOT DETECTED NOT DETECTED Final   Klebsiella aerogenes NOT DETECTED NOT DETECTED Final   Klebsiella oxytoca NOT DETECTED NOT DETECTED Final   Klebsiella pneumoniae NOT DETECTED NOT DETECTED Final   Proteus species DETECTED (A) NOT DETECTED Final    Comment: CRITICAL RESULT CALLED TO, READ BACK BY AND VERIFIED WITH:  C/ PHARMD A. PAYTES 10/12/22 1429 A. LAFRANCE    Salmonella species NOT DETECTED NOT DETECTED Final   Serratia marcescens NOT DETECTED NOT DETECTED Final   Haemophilus influenzae NOT DETECTED NOT DETECTED Final   Neisseria meningitidis NOT DETECTED NOT DETECTED Final   Pseudomonas aeruginosa NOT DETECTED NOT DETECTED Final   Stenotrophomonas maltophilia NOT DETECTED NOT DETECTED Final   Candida albicans NOT DETECTED NOT DETECTED Final   Candida auris NOT DETECTED NOT DETECTED Final   Candida glabrata NOT DETECTED NOT DETECTED Final   Candida krusei NOT DETECTED NOT DETECTED Final   Candida parapsilosis NOT DETECTED NOT DETECTED Final   Candida tropicalis NOT DETECTED NOT DETECTED Final   Cryptococcus neoformans/gattii NOT DETECTED NOT DETECTED Final   CTX-M ESBL NOT DETECTED NOT DETECTED Final   Carbapenem resistance IMP NOT DETECTED NOT DETECTED Final   Carbapenem resistance KPC NOT DETECTED NOT DETECTED Final   Carbapenem resistance NDM NOT DETECTED NOT DETECTED Final   Carbapenem resist OXA 48 LIKE NOT DETECTED NOT DETECTED Final   Carbapenem resistance VIM NOT DETECTED NOT DETECTED Final    Comment: Performed at [facility], [address], [place], [place] [address]  Blood Culture (routine x 2)     Status: Abnormal    Collection Time: 10/11/22 10:40 PM   Specimen: BLOOD LEFT HAND  Result Value Ref Range Status   Specimen Description BLOOD LEFT HAND  Final   Special Requests   Final    BOTTLES DRAWN AEROBIC AND ANAEROBIC Blood Culture adequate volume   Culture  Setup Time   Final    GRAM NEGATIVE RODS IN BOTH AEROBIC AND ANAEROBIC BOTTLES CRITICAL RESULT CALLED TO, READ BACK BY AND VERIFIED WITH: PHARMD MARY ASHLYN ON 10/12/22 @ 1649 BY DRT    Culture (A)  Final    PROTEUS MIRABILIS PROVIDENCIA STUARTII SUSCEPTIBILITIES PERFORMED ON PREVIOUS CULTURE WITHIN THE LAST 5 DAYS.    Report Status 10/15/2022 FINAL  Final  MRSA Next Gen by PCR, Nasal     Status: Abnormal   Collection Time: 10/12/22 10:45 AM   Specimen: Nasal Mucosa; Nasal Swab  Result Value Ref Range Status   MRSA by PCR Next Gen DETECTED (A) NOT DETECTED Final    Comment: RESULT CALLED TO, READ BACK BY AND VERIFIED WITH:  Tyna Jaksch, RN 10/12/22 1515 A. LAFRANCE (NOTE) The GeneXpert MRSA Assay (FDA approved for NASAL specimens only), is one component of a comprehensive MRSA colonization surveillance program. It is not intended to diagnose MRSA infection nor to guide or monitor treatment for MRSA infections. Test performance is not FDA approved in patients less than 39 years old. Performed at [facility], [address] [place], [place] [address]      Discharge Instructions:   Discharge Instructions     Call MD for:  temperature >100.4   Complete by: As directed    Diet - low sodium heart healthy   Complete by: As directed  Discharge instructions   Complete by: As directed    Complete the course of antibiotic.  Follow-up with primary care provider at the skilled nursing facility in 3 to 5 days.   Increase activity slowly   Complete by: As directed       Allergies as of 10/16/2022       Reactions   Isovue [iopamidol] Hives   Pt broke out in one hive on his chest after contrast on 01/03/15.  Pt will need  full premeds in the future per Dr [place].          Medication List     STOP taking these medications    phenytoin 50 MG tablet Commonly known as: DILANTIN       TAKE these medications    acetaminophen 500 MG tablet Commonly known as: TYLENOL Take 1,000 mg by mouth daily.   ascorbic acid 500 MG tablet Commonly known as: VITAMIN C Take 500 mg by mouth daily.   aspirin EC 81 MG tablet Take 1 tablet (81 mg total) by mouth daily.   atenolol 50 MG tablet Commonly known as: TENORMIN Take 1 tablet (50 mg total) by mouth daily.   busPIRone 10 MG tablet Commonly known as: BUSPAR Take 10 mg by mouth 3 (three) times daily.   ciprofloxacin 500 MG tablet Commonly known as: CIPRO Take 1 tablet (500 mg total) by mouth 2 (two) times daily for 2 days.   clopidogrel 75 MG tablet Commonly known as: Plavix Take 1 tablet (75 mg total) by mouth daily.   docusate sodium 100 MG capsule Commonly known as: COLACE Take 100 mg by mouth daily.   ferrous gluconate 324 MG tablet Commonly known as: FERGON Take 324 mg by mouth See admin instructions. 324 mg once daily on Monday, Thursday.   gabapentin 100 MG capsule Commonly known as: NEURONTIN Take 200 mg by mouth at bedtime.   GNP Vitamin D3 10 MCG (400 UNIT) Tabs tablet Generic drug: cholecalciferol Take 400 Units by mouth daily.   guaifenesin 100 MG/5ML syrup Commonly known as: ROBITUSSIN Take 100 mg by mouth 2 (two) times daily.   multivitamin with minerals tablet Take 1 tablet by mouth daily.   oxyCODONE-acetaminophen 5-325 MG tablet Commonly known as: PERCOCET/ROXICET Take 1 tablet by mouth every 4 (four) hours as needed (pain).   phenytoin 200 MG ER capsule Commonly known as: DILANTIN Take 1 capsule (200 mg total) by mouth at bedtime.   phenytoin 100 MG ER capsule Commonly known as: DILANTIN Take 1 capsule (100 mg total) by mouth daily.   polyethylene glycol 17 g packet Commonly known as: MIRALAX /  GLYCOLAX Take 17 g by mouth daily as needed for moderate constipation.   pravastatin 40 MG tablet Commonly known as: PRAVACHOL Take 40 mg by mouth at bedtime.   Pro-Stat Liqd Take 30 mLs by mouth 2 (two) times daily.   tamsulosin 0.4 MG Caps capsule Commonly known as: FLOMAX Take 1 capsule (0.4 mg total) by mouth daily.          Time coordinating discharge: 39 minutes  Signed:  Rajanee Schuelke  [facility] 10/16/2022, 9:15 AM

## 2022-11-05 IMAGING — CT CT ANGIO CHEST
4 of 12 series · 11 of 37 positions shown · IV contrast (iopamidol)
Comparison: March 31, 2018

CLINICAL DATA: Thoracic aortic aneurysm follow-up, history of AAA
repair in 0574.

EXAM:
CT ANGIOGRAPHY CHEST WITHOUT AND WITH WITH CONTRAST
TECHNIQUE: Multidetector CT imaging of the chest was performed using the
standard protocol during bolus administration of intravenous
contrast. Multiplanar CT image reconstructions and MIPs were
obtained to evaluate the vascular anatomy. Precontrast images were
also acquired through the chest.
Creatinine was obtained on site at [HOSPITAL] at [REDACTED].
Results: Creatinine 1.1 mg/dL.
CONTRAST:  75mL [id number] IOPAMIDOL ([id number]) INJECTION 76%

[Series 6: chest w/o 2.00 br40 s3 axial · axial · non-contrast · 0.50mm/px · z∈[+1508,+1628]mm · 2 of 180 slices shown]
[im 60/180  lung]
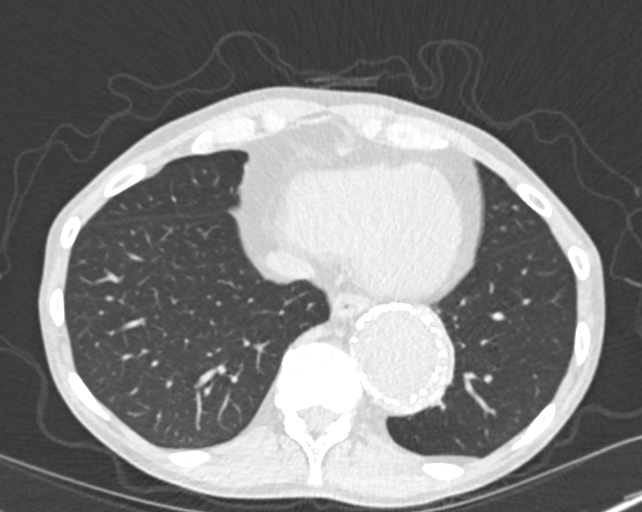
[im 120/180  lung]
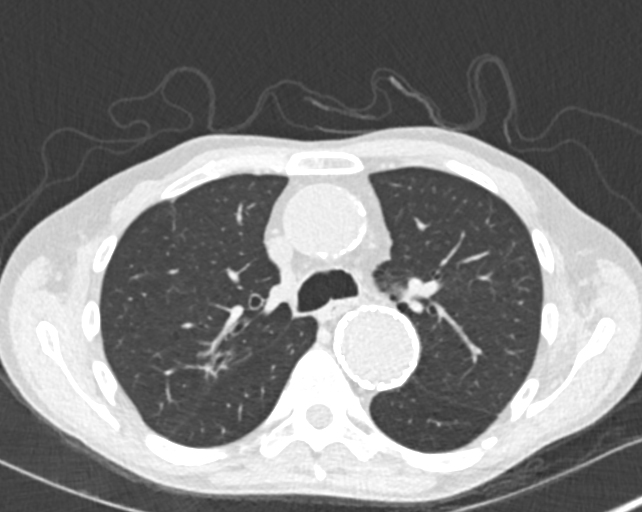

[Series 10: cta thorax 2.00 bv36 s3 axial arterial · axial · arterial · 0.45mm/px · z∈[+1476,+1658]mm · 3 of 183 slices shown]
[im 46/183  lung]
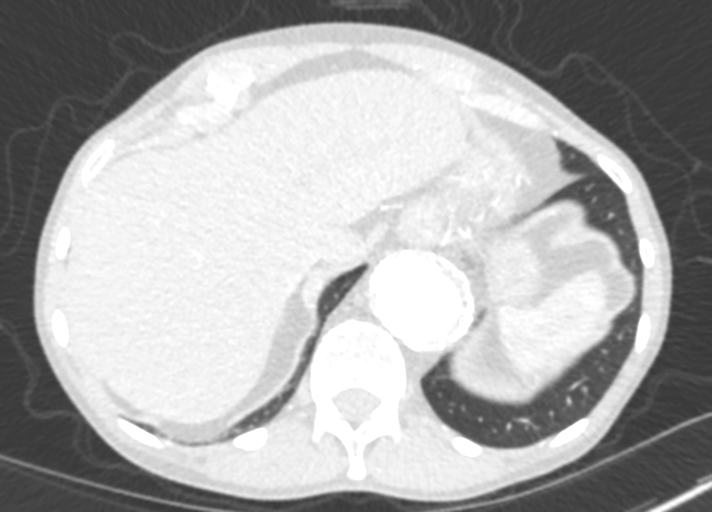
[im 92/183  mediastinal]
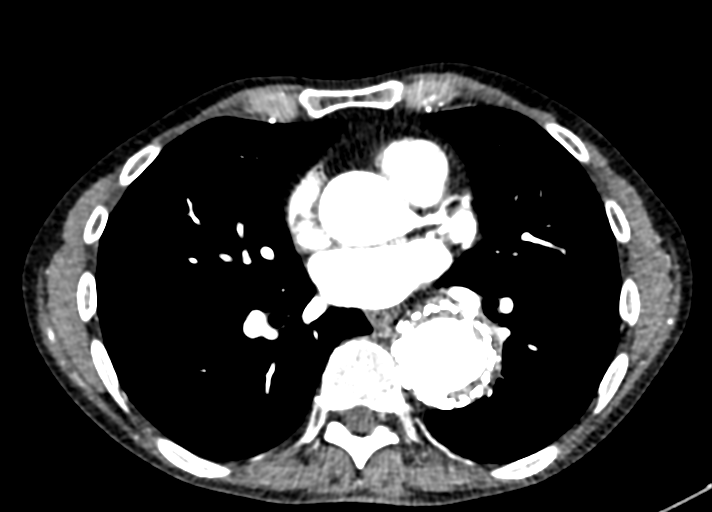
[im 137/183  lung]
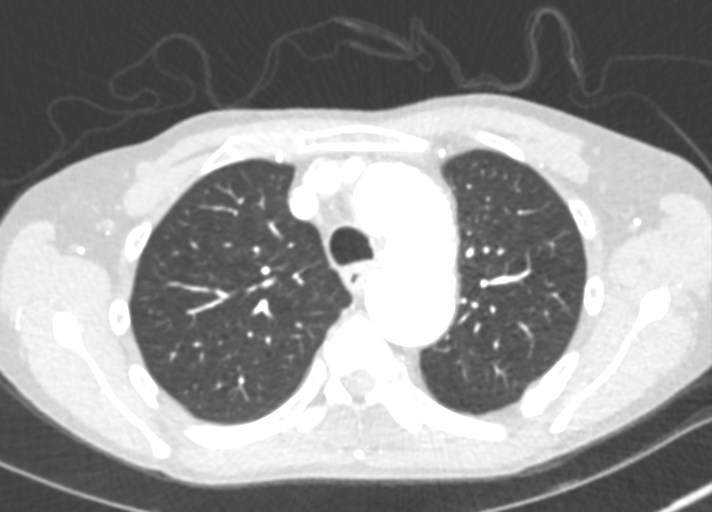

[Series 12: cta thorax 2.00 br60 s3 axial lungs art · axial · 0.45mm/px · z∈[+1476,+1658]mm · 3 of 183 slices shown]
[im 46/183  lung]
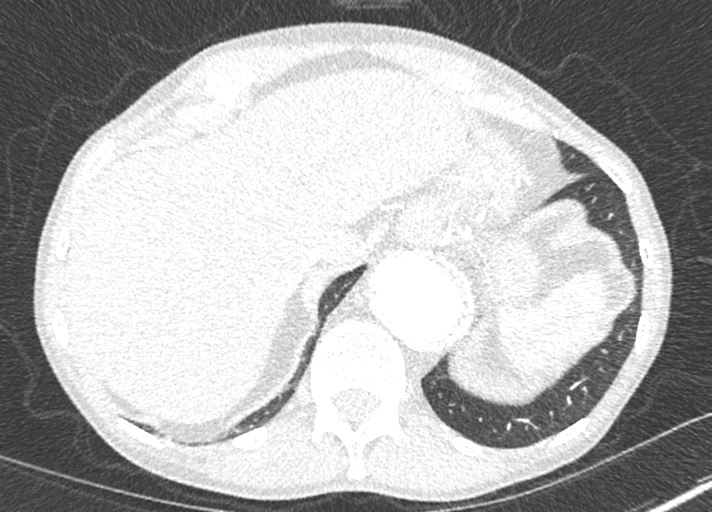
[im 92/183  lung]
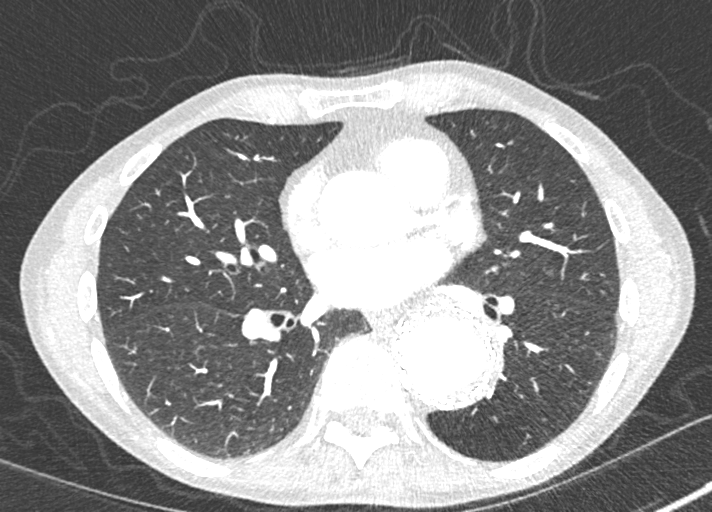
[im 137/183  lung]
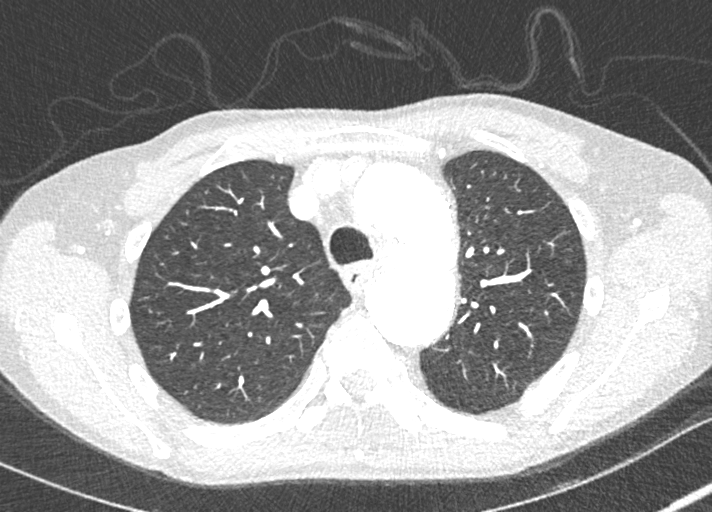

[Series 23: cta stent delays 2.00 bv36 s3 axial delays · axial · 0.45mm/px · z∈[+1475,+1657]mm · 3 of 183 slices shown]
[im 46/183  lung]
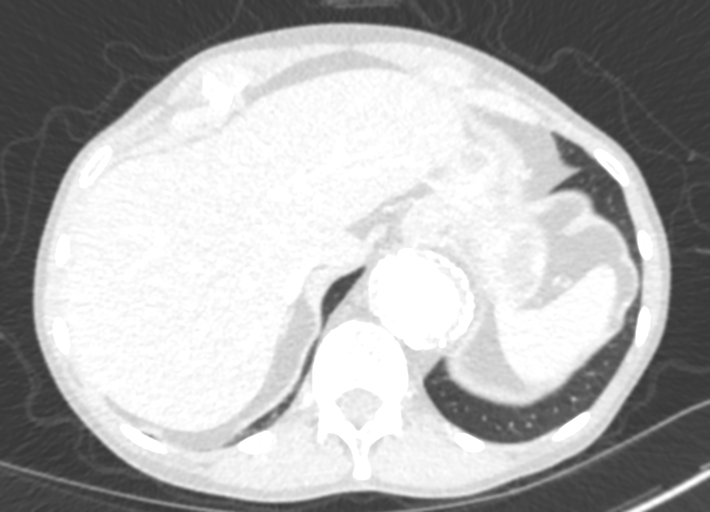
[im 92/183  lung]
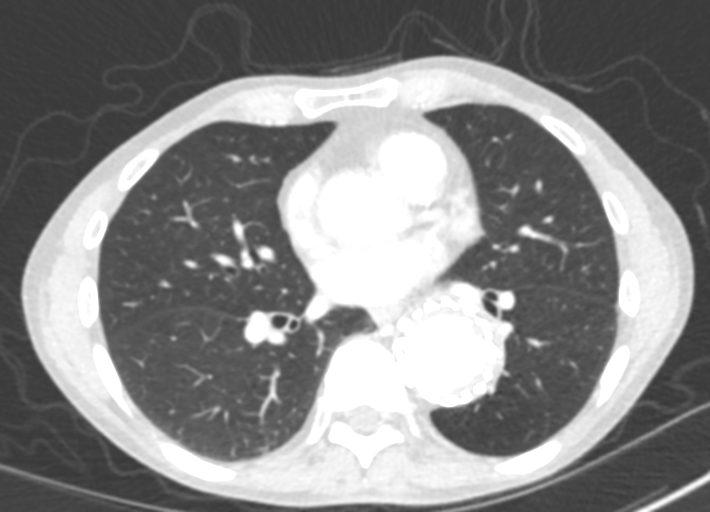
[im 137/183  lung]
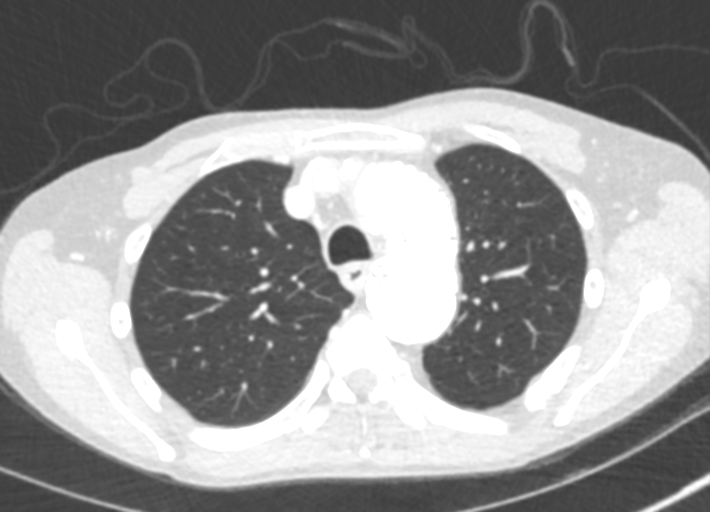

[11 of 37 positions shown; findings below may reference images not displayed]

FINDINGS: Cardiovascular: NONCONTRAST IMAGES OF THE CHEST WITH SIGNS OF
ENDOVASCULAR REPAIR OF THORACIC AORTIC ANEURYSM BEGINNING AT THE
LEVEL OF THE MID TO DISTAL THORACIC AORTIC ARCH.

ASCENDING THORACIC AORTA 3.5 CM UNCHANGED. STENT GRAFT IN THE
ASCENDING THORACIC AORTA BEGINS JUST AT THE LEVEL OF THE LEFT
SUBCLAVIAN ARTERY. GREAT VESSELS IN THE CHEST ARE PATENT. SIGNS OF
ATHEROSCLEROTIC PLAQUE WITHIN THE LUMEN OF THE STENT GRAFT AND ABOUT
THE ASCENDING THORACIC AORTA A SHOWING A SIMILAR APPEARANCE.

GREATEST CALIBER OF THE ASCENDING THORACIC AORTA APPROXIMATELY
CM SIMILAR TO THE PREVIOUS EXAM. NO SIGNS OF IN ENDOLEAK OR
EXPANSION OF THE ANEURYSM ABOUT THE STENT GRAFT.

HEART SIZE IS STABLE WITHOUT EVIDENCE OF PERICARDIAL EFFUSION.

Mediastinum/Nodes: THORACIC INLET STRUCTURES ARE NORMAL. NO AXILLARY
LYMPHADENOPATHY. NO THORACIC INLET ADENOPATHY. NO MEDIASTINAL OR
HILAR LYMPHADENOPATHY.

Lungs/Pleura: BASILAR ATELECTASIS, MILD. NODULAR DENSITY WITH
FISSURAL DISTORTION ALONG THE MAJOR FISSURE IN THE RIGHT CHEST
MEASURING 15 X 7 MM PREVIOUSLY APPROXIMATELY 14 X 7 MM IN 3220 BUT
INCREASED IN DENSITY COMPARED TO THE STUDY OF 0574.

Upper Abdomen: INCIDENTAL IMAGING OF UPPER ABDOMINAL CONTENTS WITH
NORMAL APPEARANCE OF THE LIVER, SPLEEN, PANCREAS AND ADRENAL GLANDS.
SYMMETRIC RENAL ENHANCEMENT WITH RESPECT TO VISUALIZED PORTIONS.

Musculoskeletal: SIGNS OF PRIOR TRAUMA TO THE LEFT HEMITHORAX. NO
DESTRUCTIVE BONE PROCESS.

Review of the MIP images confirms the above findings.
IMPRESSION: 1. Thoracic aortic aneurysm post endograft placement measuring up to
4.8 cm in diameter as before.
2. RIGHT upper lobe nodule in the setting of pulmonary emphysema
shows fissural distortion. Findings are suspicious for indolent
bronchogenic neoplasm. This area shows very slow enlargement over
time dating back to 8083 where was nearly imperceptible.
3. Mild cardiomegaly.
4. Aortic atherosclerosis.  Pulmonary emphysema.

Aortic Atherosclerosis (BJBW6-PBV.V) and Emphysema ([id number].G).

## 2022-11-09 ENCOUNTER — Non-Acute Institutional Stay: Payer: Medicare Other | Admitting: Hospice

## 2022-11-09 DIAGNOSIS — F32A Depression, unspecified: Secondary | ICD-10-CM

## 2022-11-09 DIAGNOSIS — R569 Unspecified convulsions: Secondary | ICD-10-CM

## 2022-11-09 DIAGNOSIS — R4189 Other symptoms and signs involving cognitive functions and awareness: Secondary | ICD-10-CM

## 2022-11-09 DIAGNOSIS — Z515 Encounter for palliative care: Secondary | ICD-10-CM

## 2022-11-09 DIAGNOSIS — R4182 Altered mental status, unspecified: Secondary | ICD-10-CM

## 2022-11-09 NOTE — Progress Notes (Signed)
Therapist, nutritional Palliative Care Consult Note Telephone: [phone]  Fax: [phone]  PATIENT NAME: [name] [address] [place] [address] [phone] (home)  DOB: [birth year] MRN: [id number]  PRIMARY CARE PROVIDER:    Karna Dupes, MD,  [address] [place] [address] [phone]  REFERRING PROVIDER:   Karna Dupes, MD [address] [place],  [place] [address] [phone]  RESPONSIBLE PARTY:   [name] Information     Name Relation Home Work [place] Sister [phone]        I met face to face with patient in the facility. Visit to build trust and highlight Palliative Medicine as specialized medical care for people living with serious illness, aimed at facilitating better quality of life through symptoms relief, assisting with advance care planning and complex medical decision making. Palliative initial consult s/p hospitalization 4/18-4/23/24 for severe sepsis, treated and discharged to SNF for ongoing care  ASSESSMENT AND / RECOMMENDATIONS:  ------------------------------------------------------------------------------------------------------------------------------------------------- Advance Care Planning: Our advance care planning conversation included a discussion about:    The value and importance of advance care planning  Difference between Hospice and Palliative care Exploration of goals of care in the event of a sudden injury or illness  Identification and preparation of a healthcare agent  Review and updating or creation of an  advance directive document . Decision not to resuscitate or to de-escalate disease focused treatments due to poor prognosis.  CODE STATUS:Patient is a Full code.   Goals of Care: Goals include to maximize quality of life and symptom management  I spent  20 minutes providing this initial consultation.  More than 50% of the time in this consultation was spent on counseling patient and coordinating communication. --------------------------------------------------------------------------------------------------------------------------------------   Goals of Care: Goals include to maximize quality of life and symptom management.   Symptom Management/Plan: Altered mental status: related to severe sepsis from Proteus UTI for which hospitalization 4/18-4/23/24 for severe sepsis, treated and discharged to SNF for ongoing care. Back to baseline with underlying cognitive impairment.  Cognitive impairment: memory loss/confusion at baseline, impoverished thoughts likely related to history of TBI. Continue ongoing supportive care.  Fall/safety precautions.  Followed by Neurologist.   Anxiety/depression: Recurrent, continue buspirone and mirtazapine as ordered.  Use de-escalation techniques.  Encourage socialization and participation in facility activities.  Seizure: No report of seizures since last visit.  Continue phenytoin, gabapentin.  Follow-up with neurologist as planned/needed. Routine CBC CMP, phenytoin.   Follow up: Palliative care will continue to follow for complex medical decision making, advance care planning, and clarification of goals. Return 6 weeks or prn. Encouraged to call provider sooner with any concerns.   Family /Caregiver/Community Supports: Patient in SNF for ongoing care  HOSPICE ELIGIBILITY/DIAGNOSIS: TBD  Chief Complaint: Initial Palliative care visit  HISTORY OF PRESENT ILLNESS:  [name] is a 66 y.o. year old male  with multiple morbidities requiring close monitoring/management, and with high risk of complications and  mortality: Altered mental status, cognitive impairment, severe peripheral vascular disease requiring bilateral AKA. History of TBI, lung nodule followed by pulmonologist, seizure disorder, last seizure per sister 2010.  Patient denies pain/discomfort,  FLACC O, nursing with no concerns today. History obtained from review of EMR, discussion with primary team, caregiver, family and/or [name].  Review and summarization of Epic records shows history from other than patient. Rest of 10 point ROS asked and negative. Independent interpretation of tests and  reviewed as needed, available labs, patient records, imaging, studies and related documents from the EMR.  PHYSICAL EXAM: GEN: in no acute distress  Cardiac: S1 S2 Respiratory: Lung sounds clear to auscultation, no adventitious lung sounds GI: soft, nontender, nondistended, + BS   MS: BAKA Skin: warm and dry, no rash to visible skin Neuro: generalized weakness, A and O x 1, memory loss Psych: non-anxious affect  PAST MEDICAL HISTORY:  Active Ambulatory Problems    Diagnosis Date Noted   Hyponatremia 10/18/2011   Seizures (HCC) 10/18/2011   Hypertension 10/18/2011   TBI (traumatic brain injury) (HCC) 10/18/2011   H/O alcohol abuse 10/18/2011   Tobacco abuse 10/18/2011   Cardiomyopathy, nonischemic (HCC) 02/14/2012   Short-term memory loss 02/14/2012   Shoulder pain, right 02/16/2012   Healthcare maintenance 02/16/2012   Hyperextension of knee or lower leg 05/08/2012   Lumbosacral radiculopathy at L4 07/30/2012   Urge urinary incontinence 08/26/2012   Depression 10/16/2012   Dry skin 10/16/2012   Hyperlipidemia 10/18/2012   Poor short term memory 10/18/2012   AAA (abdominal aortic aneurysm) without rupture (HCC) 10/21/2012   Thoracic aneurysm without mention of rupture 10/21/2012   Lung nodule    Subdural hematoma (HCC) 06/14/2021   Protein-calorie malnutrition, severe 06/15/2021   Dementia without behavioral disturbance (HCC) 04/25/2021   PVD (peripheral vascular disease) (HCC) 02/06/2022   Anxiety    Complicated UTI (urinary tract infection) 06/14/2022   Acute metabolic encephalopathy 06/15/2022   UTI (urinary tract infection) 06/15/2022   Chronic ulcer of left heel  (HCC) 06/15/2022   Phenytoin toxicity 07/14/2022   Seizure disorder (HCC) 07/14/2022   Acute sinusitis 07/14/2022   Chronic pain 07/14/2022   Dilantin toxicity 07/15/2022   Severe sepsis (HCC) 10/12/2022   AKI (acute kidney injury) (HCC) 10/15/2022   Bacteremia 10/15/2022   Sepsis with acute renal failure and septic shock (HCC) 10/15/2022   Resolved Ambulatory Problems    Diagnosis Date Noted   Confusion 02/14/2012   Acute URI 05/08/2012   Thoracic aortic aneurysm (HCC) 09/12/2012   Aneurysm of thoracic aorta (HCC) 05/10/2014   Thoracic aortic aneurysm without rupture (HCC) 06/11/2014   Past Medical History:  Diagnosis Date   AAA (abdominal aortic aneurysm) (HCC)    Alcohol abuse    Aortic aneurysm (HCC)    Blood transfusion    Cardiomegaly    Lumbar herniated disc    Memory deficits    Radiculopathy    Urinary urgency     SOCIAL HX:  Social History   Tobacco Use   Smoking status: Former    Packs/day: .1    Types: Cigarettes    Passive exposure: Never   Smokeless tobacco: Never   Tobacco comments:    "as much as he can"- per Med Tech at [facility] [facility] 07/01/19  Substance Use Topics   Alcohol use: No    Alcohol/week: 0.0 standard drinks of alcohol    Comment: per sister, last drink was apr 5th--2013 hx of ETOH     FAMILY HX:  Family History  Problem Relation Age of Onset   Liver disease Mother    Hypertension Mother    Breast cancer Maternal Aunt    Esophageal cancer Cousin    Esophageal cancer Maternal Uncle    Colon cancer Neg Hx    Stomach cancer Neg Hx    Colon polyps Neg Hx    Rectal cancer Neg Hx       ALLERGIES:  Allergies  Allergen Reactions   Isovue [  Iopamidol] Hives    Pt broke out in one hive on his chest after contrast on 01/03/15.  Pt will need full premeds in the future per Dr Swaziland.        PERTINENT MEDICATIONS:  Outpatient Encounter Medications as of 11/09/2022  Medication Sig   acetaminophen (TYLENOL) 500 MG tablet Take 1,000 mg by mouth  daily.   Amino Acids-Protein Hydrolys (PRO-STAT) LIQD Take 30 mLs by mouth 2 (two) times daily.   ascorbic acid (VITAMIN C) 500 MG tablet Take 500 mg by mouth daily.   aspirin EC 81 MG tablet Take 1 tablet (81 mg total) by mouth daily.   atenolol (TENORMIN) 50 MG tablet Take 1 tablet (50 mg total) by mouth daily.   busPIRone (BUSPAR) 10 MG tablet Take 10 mg by mouth 3 (three) times daily.   cholecalciferol (GNP VITAMIN D3) 10 MCG (400 UNIT) TABS tablet Take 400 Units by mouth daily.   clopidogrel (PLAVIX) 75 MG tablet Take 1 tablet (75 mg total) by mouth daily.   docusate sodium (COLACE) 100 MG capsule Take 100 mg by mouth daily.   ferrous gluconate (FERGON) 324 MG tablet Take 324 mg by mouth See admin instructions. 324 mg once daily on Monday, Thursday.   gabapentin (NEURONTIN) 100 MG capsule Take 200 mg by mouth at bedtime.   guaifenesin (ROBITUSSIN) 100 MG/5ML syrup Take 100 mg by mouth 2 (two) times daily.   Multiple Vitamins-Minerals (MULTIVITAMIN WITH MINERALS) tablet Take 1 tablet by mouth daily.   oxyCODONE-acetaminophen (PERCOCET/ROXICET) 5-325 MG tablet Take 1 tablet by mouth every 4 (four) hours as needed (pain).   phenytoin (DILANTIN) 100 MG ER capsule Take 1 capsule (100 mg total) by mouth daily.   phenytoin (DILANTIN) 200 MG ER capsule Take 1 capsule (200 mg total) by mouth at bedtime.   polyethylene glycol (MIRALAX / GLYCOLAX) 17 g packet Take 17 g by mouth daily as needed for moderate constipation.   pravastatin (PRAVACHOL) 40 MG tablet Take 40 mg by mouth at bedtime.   tamsulosin (FLOMAX) 0.4 MG CAPS capsule Take 1 capsule (0.4 mg total) by mouth daily.   No facility-administered encounter medications on file as of 11/09/2022.   Thank you for the opportunity to participate in the care of Mr. Tangonan.  The palliative care team will continue to follow. Please call our office at [phone] if we can be of additional assistance.   Note: Portions of this note were generated with  Scientist, clinical (histocompatibility and immunogenetics). Dictation errors may occur despite best attempts at proofreading.  Rosaura Carpenter, NP

## 2022-12-05 ENCOUNTER — Non-Acute Institutional Stay: Payer: Medicare Other | Admitting: Hospice

## 2022-12-05 DIAGNOSIS — F0390 Unspecified dementia without behavioral disturbance: Secondary | ICD-10-CM

## 2022-12-05 DIAGNOSIS — R569 Unspecified convulsions: Secondary | ICD-10-CM

## 2022-12-05 DIAGNOSIS — F419 Anxiety disorder, unspecified: Secondary | ICD-10-CM

## 2022-12-05 DIAGNOSIS — Z515 Encounter for palliative care: Secondary | ICD-10-CM

## 2022-12-05 NOTE — Progress Notes (Signed)
Therapist, nutritional Palliative Care Consult Note Telephone: [phone]  Fax: [phone]  PATIENT NAME: [name] [address] [place] [address] [phone] (home)  DOB: [birth year] MRN: [id number]  PRIMARY CARE PROVIDER:    Karna Dupes, MD,  [address] [place] [address] [phone]  REFERRING PROVIDER:   Karna Dupes, MD [address] [place],  [place] [address] [phone]  RESPONSIBLE PARTY:   Rande Brunt Information     Name Relation Home Work [place] Sister [phone]        I met face to face with patient in the facility. Visit to build trust and highlight Palliative Medicine as specialized medical care for people living with serious illness, aimed at facilitating better quality of life through symptoms relief, assisting with advance care planning and complex medical decision making. This is a follow up visit.   ASSESSMENT AND / RECOMMENDATIONS:   CODE STATUS:Patient is a Full code.   Goals of Care: Goals include to maximize quality of life and symptom management.  Symptom Management/Plan:  Dementia: ongoing memory loss/confusion, impoverished thoughts, in the context of a history of TBI. Continue ongoing supportive care.  Fall/safety precautions.  Followed by Neurologist.   Anxiety/depression: Recurrent, continue buspirone, mirtazapine and recently added Trintellix as ordered.  Use de-escalation techniques.  Encourage socialization and participation in facility activities.  Seizure: Maintain seizure precautions. Continue phenytoin, gabapentin.  Last Phenytoin level 10/24/22 is 3.7. No report of seizure since last visit.  Follow-up with neurologist as planned/needed. Routine CBC CMP, phenytoin.   Follow up: Palliative care will continue to follow for complex medical decision making, advance care planning, and clarification of goals.  Return 6 weeks or prn. Encouraged to call provider sooner with any concerns.   Family /Caregiver/Community Supports: Patient in SNF for ongoing care  HOSPICE ELIGIBILITY/DIAGNOSIS: TBD  Chief Complaint: F/u visit  HISTORY OF PRESENT ILLNESS:  [name] is a 67 y.o. 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 History of TBI, altered mental status, lung nodule followed by pulmonologist, seizure disorder, last seizure per sister 2010.  Patient denies pain/discomfort, reports good mood, FLACC 0, nursing with no concerns today. History obtained from review of EMR, discussion with primary team, caregiver, family and/or Mr. Cruzen.  Review and summarization of Epic records shows history from other than patient. Rest of 10 point ROS asked and negative. Independent interpretation of tests and reviewed as needed, available labs, patient records, imaging, studies and related documents from the EMR.  PHYSICAL EXAM: GEN: in no acute distress  Cardiac: S1 S2 Respiratory: Lung sounds clear to auscultation, no adventitious lung sounds GI: soft, nontender, nondistended, + BS   MS: BAKA Skin: warm and dry, no rash to visible skin Neuro: generalized weakness, A and O x 1, memory loss Psych: non-anxious affect  PAST MEDICAL HISTORY:  Active Ambulatory Problems    Diagnosis Date Noted   Hyponatremia 10/18/2011   Seizures (HCC) 10/18/2011   Hypertension 10/18/2011   TBI (traumatic brain injury) (HCC) 10/18/2011   H/O alcohol abuse 10/18/2011   Tobacco abuse 10/18/2011   Cardiomyopathy, nonischemic (HCC) 02/14/2012   Short-term memory loss 02/14/2012   Shoulder pain, right 02/16/2012   Healthcare maintenance 02/16/2012   Hyperextension of knee or lower leg 05/08/2012   Lumbosacral radiculopathy at L4 07/30/2012  Urge urinary incontinence  08/26/2012   Depression 10/16/2012   Dry skin 10/16/2012   Hyperlipidemia 10/18/2012   Poor short term memory 10/18/2012   AAA (abdominal aortic aneurysm) without rupture (HCC) 10/21/2012   Thoracic aneurysm without mention of rupture 10/21/2012   Lung nodule    Subdural hematoma (HCC) 06/14/2021   Protein-calorie malnutrition, severe 06/15/2021   Dementia without behavioral disturbance (HCC) 04/25/2021   PVD (peripheral vascular disease) (HCC) 02/06/2022   Anxiety    Complicated UTI (urinary tract infection) 06/14/2022   Acute metabolic encephalopathy 06/15/2022   UTI (urinary tract infection) 06/15/2022   Chronic ulcer of left heel (HCC) 06/15/2022   Phenytoin toxicity 07/14/2022   Seizure disorder (HCC) 07/14/2022   Acute sinusitis 07/14/2022   Chronic pain 07/14/2022   Dilantin toxicity 07/15/2022   Severe sepsis (HCC) 10/12/2022   AKI (acute kidney injury) (HCC) 10/15/2022   Bacteremia 10/15/2022   Sepsis with acute renal failure and septic shock (HCC) 10/15/2022   Resolved Ambulatory Problems    Diagnosis Date Noted   Confusion 02/14/2012   Acute URI 05/08/2012   Thoracic aortic aneurysm (HCC) 09/12/2012   Aneurysm of thoracic aorta (HCC) 05/10/2014   Thoracic aortic aneurysm without rupture (HCC) 06/11/2014   Past Medical History:  Diagnosis Date   AAA (abdominal aortic aneurysm) (HCC)    Alcohol abuse    Aortic aneurysm (HCC)    Blood transfusion    Cardiomegaly    Lumbar herniated disc    Memory deficits    Radiculopathy    Urinary urgency     SOCIAL HX:  Social History   Tobacco Use   Smoking status: Former    Packs/day: .1    Types: Cigarettes    Passive exposure: Never   Smokeless tobacco: Never   Tobacco comments:    "as much as he can"- per Med Tech at [employer] [facility] 07/01/19  Substance Use Topics   Alcohol use: No    Alcohol/week: 0.0 standard drinks of alcohol    Comment: per sister, last drink was apr 5th--2013 hx of ETOH     FAMILY HX:   Family History  Problem Relation Age of Onset   Liver disease Mother    Hypertension Mother    Breast cancer Maternal Aunt    Esophageal cancer Cousin    Esophageal cancer Maternal Uncle    Colon cancer Neg Hx    Stomach cancer Neg Hx    Colon polyps Neg Hx    Rectal cancer Neg Hx       ALLERGIES:  Allergies  Allergen Reactions   Isovue [Iopamidol] Hives    Pt broke out in one hive on his chest after contrast on 01/03/15.  Pt will need full premeds in the future per Dr Swaziland.        PERTINENT MEDICATIONS:  Outpatient Encounter Medications as of 12/05/2022  Medication Sig   acetaminophen (TYLENOL) 500 MG tablet Take 1,000 mg by mouth daily.   Amino Acids-Protein Hydrolys (PRO-STAT) LIQD Take 30 mLs by mouth 2 (two) times daily.   ascorbic acid (VITAMIN C) 500 MG tablet Take 500 mg by mouth daily.   aspirin EC 81 MG tablet Take 1 tablet (81 mg total) by mouth daily.   atenolol (TENORMIN) 50 MG tablet Take 1 tablet (50 mg total) by mouth daily.   busPIRone (BUSPAR) 10 MG tablet Take 10 mg by mouth 3 (three) times daily.   cholecalciferol (GNP VITAMIN D3) 10 MCG (400 UNIT) TABS tablet Take 400  Units by mouth daily.   clopidogrel (PLAVIX) 75 MG tablet Take 1 tablet (75 mg total) by mouth daily.   docusate sodium (COLACE) 100 MG capsule Take 100 mg by mouth daily.   ferrous gluconate (FERGON) 324 MG tablet Take 324 mg by mouth See admin instructions. 324 mg once daily on Monday, Thursday.   gabapentin (NEURONTIN) 100 MG capsule Take 200 mg by mouth at bedtime.   guaifenesin (ROBITUSSIN) 100 MG/5ML syrup Take 100 mg by mouth 2 (two) times daily.   Multiple Vitamins-Minerals (MULTIVITAMIN WITH MINERALS) tablet Take 1 tablet by mouth daily.   oxyCODONE-acetaminophen (PERCOCET/ROXICET) 5-325 MG tablet Take 1 tablet by mouth every 4 (four) hours as needed (pain).   phenytoin (DILANTIN) 100 MG ER capsule Take 1 capsule (100 mg total) by mouth daily.   phenytoin (DILANTIN) 200 MG ER capsule  Take 1 capsule (200 mg total) by mouth at bedtime.   polyethylene glycol (MIRALAX / GLYCOLAX) 17 g packet Take 17 g by mouth daily as needed for moderate constipation.   pravastatin (PRAVACHOL) 40 MG tablet Take 40 mg by mouth at bedtime.   tamsulosin (FLOMAX) 0.4 MG CAPS capsule Take 1 capsule (0.4 mg total) by mouth daily.   No facility-administered encounter medications on file as of 12/05/2022.  I spent  35 minutes providing this consultation; this includes time spent with patient/family, chart review and documentation. More than 50% of the time in this consultation was spent on counseling and coordinating communication.  Thank you for the opportunity to participate in the care of Mr. Belmont.  The palliative care team will continue to follow. Please call our office at [phone] if we can be of additional assistance.   Note: Portions of this note were generated with Scientist, clinical (histocompatibility and immunogenetics). Dictation errors may occur despite best attempts at proofreading.  Rosaura Carpenter, NP

## 2023-01-02 ENCOUNTER — Other Ambulatory Visit: Payer: Self-pay

## 2023-01-02 DIAGNOSIS — I712 Thoracic aortic aneurysm, without rupture, unspecified: Secondary | ICD-10-CM

## 2023-01-02 MED ORDER — PREDNISONE 50 MG PO TABS: ORAL_TABLET | ORAL | 0 refills | Status: AC

## 2023-01-02 MED ORDER — DIPHENHYDRAMINE HCL 50 MG PO CAPS: ORAL_CAPSULE | ORAL | 0 refills | Status: AC

## 2023-01-21 ENCOUNTER — Ambulatory Visit: Payer: Medicare Other | Admitting: Surgery

## 2023-01-31 ENCOUNTER — Ambulatory Visit (HOSPITAL_COMMUNITY): Payer: Medicare Other

## 2023-02-04 ENCOUNTER — Ambulatory Visit: Payer: Medicare Other | Admitting: Surgery

## 2023-02-11 ENCOUNTER — Ambulatory Visit (HOSPITAL_COMMUNITY)
Admission: RE | Admit: 2023-02-11 | Discharge: 2023-02-11 | Disposition: A | Discharge status: Home / Self Care | Payer: Medicare Other | Source: Ambulatory Visit | Attending: Surgery | Admitting: Surgery

## 2023-02-11 DIAGNOSIS — I712 Thoracic aortic aneurysm, without rupture, unspecified: Secondary | ICD-10-CM | POA: Diagnosis present

## 2023-02-11 MED ORDER — IOHEXOL 350 MG/ML SOLN
75.0000 mL | Freq: Once | INTRAVENOUS | Status: AC | PRN
  Administered 2023-02-11: 75 mL via INTRAVENOUS

## 2023-03-04 ENCOUNTER — Telehealth: Payer: Self-pay | Admitting: Neurology

## 2023-03-04 ENCOUNTER — Ambulatory Visit (INDEPENDENT_AMBULATORY_CARE_PROVIDER_SITE_OTHER): Payer: Medicare Other | Admitting: Surgery

## 2023-03-04 ENCOUNTER — Encounter: Payer: Self-pay | Admitting: Surgery

## 2023-03-04 VITALS — BP 151/76 | HR 69 | Temp 98.4°F | Resp 20 | Ht 68.0 in | Wt 79.0 lb

## 2023-03-04 DIAGNOSIS — I712 Thoracic aortic aneurysm, without rupture, unspecified: Secondary | ICD-10-CM

## 2023-03-04 NOTE — Telephone Encounter (Signed)
REQUIRED PHONE NOTE: Lawanna Kobus from [facility] is asking to speak with clinical staff regarding orders for pt.

## 2023-03-04 NOTE — Telephone Encounter (Signed)
Return call to Bennye Alm unavailable, asked to call back when available.

## 2023-03-04 NOTE — Telephone Encounter (Signed)
REQUIRED PHONE NOTE: Lawanna Kobus from [facility] has called for POD 2

## 2023-03-04 NOTE — Telephone Encounter (Signed)
Angel @ [facility] is asking for a call to confirm plan of care

## 2023-03-04 NOTE — Progress Notes (Signed)
[facility]ist of [place]  Patient name: [name] MRN: [id number] DOB: 20-Aug-1955 Sex: male   REASON FOR VISIT:    Follow up  HISOTRY OF PRESENT ILLNESS:    [name] is a 67 y.o. male who returns today for follow-up.  He is status post bilateral above-knee amputation.  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  He continues to reside at [facility].  He was recently evaluated for blood in his eye and so his Plavix has been held.  He has no complaints today  He has a history of ischemic cardiomyopathy.  He is a smoker.  He is medically managed for hypertension and takes a statin for hypercholesterolemia.  He suffers from ongoing memory loss and confusion.  He has a history of seizures PAST MEDICAL HISTORY:   Past Medical History:  Diagnosis Date   AAA (abdominal aortic aneurysm) (HCC)    Alcohol abuse    h/o, quit april 5th, 2013   Anxiety    Aortic aneurysm (HCC)    Blood transfusion    Cardiomegaly    EF 25-30 % in 2005, EF 50-55% Oct 2013   Depression    Hyperlipidemia    Hypertension    Lumbar herniated disc    Memory deficits    Radiculopathy    Seizures (HCC)    last seizure several years ago- per sister 41 or below at [facility] 07-12-17   TBI (traumatic brain injury) [facility])    Sept 1988   Urinary urgency      FAMILY HISTORY:   Family History  Problem Relation Age of Onset   Liver disease Mother    Hypertension Mother    Breast cancer Maternal Aunt    Esophageal cancer Cousin    Esophageal cancer Maternal Uncle    Colon cancer Neg Hx    Stomach cancer Neg Hx    Colon polyps Neg Hx    Rectal cancer Neg Hx     SOCIAL HISTORY:   Social History   Tobacco Use   Smoking status: Former    Current packs/day: 0.10    Types: Cigarettes    Passive exposure: Never   Smokeless tobacco: Never   Tobacco comments:    "as much  as he can"- per Med Tech at [facility] 07/01/19  Substance Use Topics   Alcohol use: No    Alcohol/week: 0.0 standard drinks of alcohol    Comment: per sister, last drink was apr 5th--2013 hx of ETOH     ALLERGIES:   Allergies  Allergen Reactions   Isovue [Iopamidol] Hives    Pt broke out in one hive on his chest after contrast on 01/03/15.  Pt will need full premeds in the future per Dr Sw[place].       CURRENT MEDICATIONS:   Current Outpatient Medications  Medication Sig Dispense Refill   acetaminophen (TYLENOL) 500 MG tablet Take 1,000 mg by mouth daily.     Amino Acids-Protein Hydrolys (PRO-STAT) LIQD Take 30 mLs by mouth 2 (two) times daily.     ascorbic acid (VITAMIN C) 500 MG tablet Take 500 mg by mouth daily.     aspirin EC 81 MG tablet Take 1 tablet (81 mg total) by mouth daily. 150 tablet 11   atenolol (TENORMIN) 50 MG tablet Take 1 tablet (50 mg total) by mouth daily. 30 tablet 2   busPIRone (BUSPAR) 10 MG tablet Take 10 mg by  mouth 3 (three) times daily.     cholecalciferol (GNP VITAMIN D3) 10 MCG (400 UNIT) TABS tablet Take 400 Units by mouth daily.     clopidogrel (PLAVIX) 75 MG tablet Take 1 tablet (75 mg total) by mouth daily. 30 tablet 11   diphenhydrAMINE (BENADRYL) 50 MG capsule Take one capsule 1 hour prior to scan. 1 capsule 0   docusate sodium (COLACE) 100 MG capsule Take 100 mg by mouth daily.     ferrous gluconate (FERGON) 324 MG tablet Take 324 mg by mouth See admin instructions. 324 mg once daily on Monday, Thursday.     gabapentin (NEURONTIN) 100 MG capsule Take 200 mg by mouth at bedtime.     guaifenesin (ROBITUSSIN) 100 MG/5ML syrup Take 100 mg by mouth 2 (two) times daily.     Multiple Vitamins-Minerals (MULTIVITAMIN WITH MINERALS) tablet Take 1 tablet by mouth daily.     oxyCODONE-acetaminophen (PERCOCET/ROXICET) 5-325 MG tablet Take 1 tablet by mouth every 4 (four) hours as needed (pain). 20 tablet 0   phenytoin (DILANTIN) 100 MG ER capsule Take 1 capsule  (100 mg total) by mouth daily.     phenytoin (DILANTIN) 200 MG ER capsule Take 1 capsule (200 mg total) by mouth at bedtime.     polyethylene glycol (MIRALAX / GLYCOLAX) 17 g packet Take 17 g by mouth daily as needed for moderate constipation.     pravastatin (PRAVACHOL) 40 MG tablet Take 40 mg by mouth at bedtime.     predniSONE (DELTASONE) 50 MG tablet Take one tablet 13 hours, 7 hours, and 1 hour prior to scan. 3 tablet 0   tamsulosin (FLOMAX) 0.4 MG CAPS capsule Take 1 capsule (0.4 mg total) by mouth daily.     No current facility-administered medications for this visit.    REVIEW OF SYSTEMS:   [X]  denotes positive finding, [ ]  denotes negative finding Cardiac  Comments:  Chest pain or chest pressure:    Shortness of breath upon exertion:    Short of breath when lying flat:    Irregular heart rhythm:        Vascular    Pain in calf, thigh, or hip brought on by ambulation:    Pain in feet at night that wakes you up from your sleep:     Blood clot in your veins:    Leg swelling:         Pulmonary    Oxygen at home:    Productive cough:     Wheezing:         Neurologic    Sudden weakness in arms or legs:     Sudden numbness in arms or legs:     Sudden onset of difficulty speaking or slurred speech:    Temporary loss of vision in one eye:     Problems with dizziness:         Gastrointestinal    Blood in stool:     Vomited blood:         Genitourinary    Burning when urinating:     Blood in urine:        Psychiatric    Major depression:         Hematologic    Bleeding problems:    Problems with blood clotting too easily:        Skin    Rashes or ulcers:        Constitutional    Fever or chills:  PHYSICAL EXAM:   Vitals:   03/04/23 1340  BP: (!) 151/76  Pulse: 69  Resp: 20  Temp: 98.4 F (36.9 C)  SpO2: 96%  Weight: 79 lb (35.8 kg)  Height: 5\' 8"  (1.727 m)    GENERAL: The patient is a well-nourished male, in no acute distress. The vital signs  are documented above. CARDIAC: There is a regular rate and rhythm.  PULMONARY: Non-labored respirations ABDOMEN: Soft and non-tender  MUSCULOSKELETAL: Bilateral above-knee amputation NEUROLOGIC: No focal weakness or paresthesias are detected. PSYCHIATRIC: The patient has a normal affect.  STUDIES:   I have reviewed the following CT scan:  1. Grossly stable dilatation and tortuosity of the descending thoracic aorta, maximum caliber 4.5 cm. Representative aortic measurements detailed above. 2. Stable appearance of the thoracic aortic stent, and descending aortic thrombus is probably unchanged allowing for technical differences. No visible endoleak. 3. Cardiomegaly with left ventricular wall and septal hypertrophy. 4. Coronary artery atherosclerosis. 5. Emphysema. 6. Nondisplaced age indeterminate longitudinal right L4 transverse process fracture. 7. Hepatic steatosis. 8. Retained mucoid secretions in the upper trachea. No aspirate is seen. MEDICAL ISSUES:   TAAA: No complicating features seen on CT scan today.  Maximum diameter is 4.5 cm which is stable.  No endoleak is seen.  I will repeat imaging in 2 years.  PAD: Bilateral above-knee amputation    Charlena Cross, MD, FACS [facility] [phone] Pager [phone]

## 2023-03-04 NOTE — Telephone Encounter (Signed)
Received call back and Lawanna Kobus states that call was not intended for our office.

## 2023-03-21 ENCOUNTER — Emergency Department (HOSPITAL_COMMUNITY): Payer: Medicare Other

## 2023-03-21 ENCOUNTER — Other Ambulatory Visit: Payer: Self-pay

## 2023-03-21 ENCOUNTER — Emergency Department (HOSPITAL_COMMUNITY)
Admission: EM | Admit: 2023-03-21 | Discharge: 2023-03-21 | Disposition: A | Discharge status: Home / Self Care | Payer: Medicare Other | Attending: Emergency Medicine | Admitting: Emergency Medicine

## 2023-03-21 DIAGNOSIS — Z79899 Other long term (current) drug therapy: Secondary | ICD-10-CM | POA: Diagnosis not present

## 2023-03-21 DIAGNOSIS — Y92129 Unspecified place in nursing home as the place of occurrence of the external cause: Secondary | ICD-10-CM | POA: Diagnosis not present

## 2023-03-21 DIAGNOSIS — I10 Essential (primary) hypertension: Secondary | ICD-10-CM | POA: Insufficient documentation

## 2023-03-21 DIAGNOSIS — S0990XA Unspecified injury of head, initial encounter: Secondary | ICD-10-CM | POA: Diagnosis present

## 2023-03-21 DIAGNOSIS — W19XXXA Unspecified fall, initial encounter: Secondary | ICD-10-CM | POA: Insufficient documentation

## 2023-03-21 DIAGNOSIS — Z7982 Long term (current) use of aspirin: Secondary | ICD-10-CM | POA: Diagnosis not present

## 2023-03-21 NOTE — ED Notes (Signed)
Attempted to call [facility] to notify that pt was leaving ED @ this time, no answer at this time.  Report called to [facility] by previous RN.

## 2023-03-21 NOTE — ED Notes (Signed)
Ptar called 

## 2023-03-21 NOTE — ED Provider Notes (Signed)
[place] EMERGENCY DEPARTMENT AT [facility] Provider Note   CSN: [id number] Arrival date & time: 03/21/23  0111     History  Chief Complaint  Patient presents with   [name] is a 67 y.o. male.  The history is provided by the EMS personnel and the nursing home. The history is limited by the condition of the patient (Traumatic brain injury).  Fall  She is he has history of hypertension, hyperlipidemia, traumatic brain injury and was brought in as a level 2 trauma because of an unwitnessed fall and patient on clopidogrel.  Patient has no memory of the event, but has severe memory issues related to his known traumatic brain injury.  Per staff at the nursing facility, his mental status is at his baseline.   Home Medications Prior to Admission medications   Medication Sig Start Date End Date Taking? Authorizing Provider  acetaminophen (TYLENOL) 500 MG tablet Take 1,000 mg by mouth daily.    [provider]  Amino Acids-Protein Hydrolys (PRO-STAT) LIQD Take 30 mLs by mouth 2 (two) times daily.    [provider]  ascorbic acid (VITAMIN C) 500 MG tablet Take 500 mg by mouth daily.    [provider]  aspirin EC 81 MG tablet Take 1 tablet (81 mg total) by mouth daily. 08/17/21   Lars Mage, PA-C  atenolol (TENORMIN) 50 MG tablet Take 1 tablet (50 mg total) by mouth daily. 06/24/21   Meredeth Ide, MD  busPIRone (BUSPAR) 10 MG tablet Take 10 mg by mouth 3 (three) times daily.    [provider]  cholecalciferol (GNP VITAMIN D3) 10 MCG (400 UNIT) TABS tablet Take 400 Units by mouth daily.    [provider]  clopidogrel (PLAVIX) 75 MG tablet Take 1 tablet (75 mg total) by mouth daily. 08/17/21   Lars Mage, PA-C  diphenhydrAMINE (BENADRYL) 50 MG capsule Take one capsule 1 hour prior to scan. 01/02/23   Nada Libman, MD  docusate sodium (COLACE) 100 MG capsule Take 100 mg by mouth daily.    [provider]  ferrous gluconate (FERGON) 324 MG tablet Take 324 mg by mouth See admin instructions. 324 mg once daily on Monday, Thursday.    [provider]  gabapentin (NEURONTIN) 100 MG capsule Take 200 mg by mouth at bedtime.    [provider]  guaifenesin (ROBITUSSIN) 100 MG/5ML syrup Take 100 mg by mouth 2 (two) times daily.    [provider]  Multiple Vitamins-Minerals (MULTIVITAMIN WITH MINERALS) tablet Take 1 tablet by mouth daily.    [provider]  oxyCODONE-acetaminophen (PERCOCET/ROXICET) 5-325 MG tablet Take 1 tablet by mouth every 4 (four) hours as needed (pain). 07/18/22   Osvaldo Shipper, MD  phenytoin (DILANTIN) 100 MG ER capsule Take 1 capsule (100 mg total) by mouth daily. 07/19/22   Osvaldo Shipper, MD  phenytoin (DILANTIN) 200 MG ER capsule Take 1 capsule (200 mg total) by mouth at bedtime. 07/19/22   Osvaldo Shipper, MD  polyethylene glycol (MIRALAX / GLYCOLAX) 17 g packet Take 17 g by mouth daily as needed for moderate constipation. 10/16/22   Pokhrel, Rebekah Chesterfield, MD  pravastatin (PRAVACHOL) 40 MG tablet Take 40 mg by mouth at bedtime.    [provider]  predniSONE (DELTASONE) 50 MG tablet Take one tablet 13 hours, 7 hours, and 1 hour prior to scan. 01/02/23   Nada Libman, MD  tamsulosin (FLOMAX) 0.4 MG  CAPS capsule Take 1 capsule (0.4 mg total) by mouth daily. 10/16/22   Pokhrel, Rebekah Chesterfield, MD      Allergies    Isovue [iopamidol]    Review of Systems   Review of Systems  All other systems reviewed and are negative.   Physical Exam Updated Vital Signs There were no vitals taken for this visit. Physical Exam Vitals and nursing note reviewed.   67 year old male, resting comfortably and in no acute distress. Vital signs are significant for borderline elevated blood pressure. Oxygen saturation is 100%, which is normal. Head is normocephalic and atraumatic. PERRLA, EOMI.  Neck is nontender. Back is nontender. Lungs are clear  without rales, wheezes, or rhonchi. Chest is nontender. Heart has regular rate and rhythm without murmur. Abdomen is soft, flat, nontender. Extremities: Bilateral above-the-knee amputations.  No swelling or deformity or tenderness on any extremity. Skin is warm and dry without rash. Neurologic: Awake and alert, oriented to person and place but not time.  No focal findings.  ED Results / Procedures / Treatments    EKG EKG Interpretation Date/Time:  Thursday March 21 2023 01:17:30 EDT Ventricular Rate:  74 PR Interval:  166 QRS Duration:  86 QT Interval:  376 QTC Calculation: 418 R Axis:   29  Text Interpretation: Sinus rhythm Abnormal R-wave progression, early transition Minimal ST elevation, inferior leads When compared with ECG of 10/11/2022, No significant change was found Confirmed by Dione Booze ([id number]) on 03/21/2023 1:25:01 AM  Radiology CT Cervical Spine Wo Contrast  Result Date: 03/21/2023 CLINICAL DATA:  Status post fall. EXAM: CT CERVICAL SPINE WITHOUT CONTRAST TECHNIQUE: Multidetector CT imaging of the cervical spine was performed without intravenous contrast. Multiplanar CT image reconstructions were also generated. RADIATION DOSE REDUCTION: This exam was performed according to the departmental dose-optimization program which includes automated exposure control, adjustment of the mA and/or kV according to patient size and/or use of iterative reconstruction technique. COMPARISON:  January 25, 2022 FINDINGS: Alignment: There is chronic 2 mm to 3 mm stepwise retrolisthesis of the C3 vertebral body on C4, as well as C4 vertebral body on C5 and C5 vertebral body on C6. Skull base and vertebrae: No acute fracture. No primary bone lesion or focal pathologic process. Soft tissues and spinal canal: No prevertebral fluid or swelling. No visible canal hematoma. Disc levels: Moderate severity endplate sclerosis, marked severity anterior osteophyte formation and mild to moderate severity  posterior bony spurring are seen at the levels of C2-C3, C3-C4, C4-C5, C5-C6 and C6-C7. Anterior longitudinal ligament calcification is also present. Marked severity intervertebral disc space narrowing is seen at C3-C4, C4-C5, C5-C6 and C6-C7. Bilateral moderate to marked severity facet joint hypertrophy is noted. Upper chest: Negative. Other: None. IMPRESSION: Marked severity multilevel degenerative changes, as described above, without evidence of an acute cervical spine fracture or subluxation. Electronically Signed   By: Aram Candela M.D.   On: 03/21/2023 02:48   CT Head Wo Contrast  Result Date: 03/21/2023 CLINICAL DATA:  Status post fall. EXAM: CT HEAD WITHOUT CONTRAST TECHNIQUE: Contiguous axial images were obtained from the base of the skull through the vertex without intravenous contrast. RADIATION DOSE REDUCTION: This exam was performed according to the departmental dose-optimization program which includes automated exposure control, adjustment of the mA and/or kV according to patient size and/or use of iterative reconstruction technique. COMPARISON:  July 14, 2022 FINDINGS: Brain: There is mild cerebral atrophy with widening of the extra-axial spaces and ventricular dilatation. There are areas of decreased attenuation within  the white matter tracts of the supratentorial brain, consistent with microvascular disease changes. Small, chronic bilateral basal ganglia lacunar infarcts are noted. There is a small chronic right frontal lobe infarct. Vascular: Moderate to marked severity bilateral cavernous carotid artery calcification is seen. Skull: Negative for acute fracture or focal lesion. A small right frontal burr hole is suspected. Sinuses/Orbits: No acute finding. Other: None. IMPRESSION: 1. Generalized cerebral atrophy with chronic white matter small vessel ischemic changes. 2. Small, chronic bilateral basal ganglia lacunar infarcts. 3. Small chronic right frontal lobe infarct. 4. No acute  intracranial abnormality. Electronically Signed   By: Aram Candela M.D.   On: 03/21/2023 02:41    Procedures Procedures  Cardiac monitor shows normal sinus rhythm, per my interpretation.  Medications Ordered in ED Medications - No data to display  ED Course/ Medical Decision Making/ A&P                                 Medical Decision Making Amount and/or Complexity of Data Reviewed Radiology: ordered.   Level 2 trauma because of fall while taking antiplatelet agents.  No obvious sign of trauma on initial survey.  I have reviewed his electrocardiogram, my interpretation is minimal ST elevation in inferior leads unchanged from prior.  I have ordered CT scans of head and cervical spine.  I have reviewed his past records, and I note ED visits for falls on 11/06/2021, 11/09/2021, 01/25/2022.  CT scans show no acute injury.  I have independently viewed the images, and agree with radiologist's interpretation.  CRITICAL CARE Performed by: Dione Booze Total critical care time: 35 minutes Critical care time was exclusive of separately billable procedures and treating other patients. Critical care was necessary to treat or prevent imminent or life-threatening deterioration. Critical care was time spent personally by me on the following activities: development of treatment plan with patient and/or surrogate as well as nursing, discussions with consultants, evaluation of patient's response to treatment, examination of patient, obtaining history from patient or surrogate, ordering and performing treatments and interventions, ordering and review of laboratory studies, ordering and review of radiographic studies, pulse oximetry and re-evaluation of patient's condition.  Final Clinical Impression(s) / ED Diagnoses Final diagnoses:  Fall at nursing home, initial encounter    Rx / DC Orders ED Discharge Orders     None         Dione Booze, MD 03/21/23 [phone]

## 2023-03-21 NOTE — ED Triage Notes (Signed)
Pt BIB EMS from ALF, [facility]. Unwitnessed fall from wheelchair. Pt did hit head, unknown of LOC.   Reports severe headache and right thumb pain.   Hx of fall on thinners with bleed  EMS reports Previous TBI with amnesia, GCS 14 baseline. Dementia  EMS VS 130/64, HR 74, 98% RA

## 2023-03-21 NOTE — Progress Notes (Signed)
Orthopedic Tech Progress Note Patient Details:  [name] [birth year] [id number]  LEVEL 2 TRAUMA   Patient ID: [name], male   DOB: [birth year], 67 y.o.   MRN: [id number]  [name] 03/21/2023, 2:11 AM

## 2023-04-04 ENCOUNTER — Other Ambulatory Visit: Payer: Self-pay

## 2023-04-04 DIAGNOSIS — I712 Thoracic aortic aneurysm, without rupture, unspecified: Secondary | ICD-10-CM

## 2023-04-29 ENCOUNTER — Ambulatory Visit: Payer: Medicare Other | Admitting: Surgery

## 2023-08-08 ENCOUNTER — Encounter: Payer: Self-pay | Admitting: Neurology

## 2023-08-08 ENCOUNTER — Ambulatory Visit: Payer: Medicare Other | Admitting: Neurology

## 2023-08-08 VITALS — BP 128/70 | HR 78

## 2023-08-08 DIAGNOSIS — R4189 Other symptoms and signs involving cognitive functions and awareness: Secondary | ICD-10-CM

## 2023-08-08 DIAGNOSIS — S069X9S Unspecified intracranial injury with loss of consciousness of unspecified duration, sequela: Secondary | ICD-10-CM

## 2023-08-08 DIAGNOSIS — G40909 Epilepsy, unspecified, not intractable, without status epilepticus: Secondary | ICD-10-CM | POA: Diagnosis not present
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
## 2023-08-08 NOTE — Patient Instructions (Addendum)
Continue current medications including Dilantin 100 mg in the morning and 200 mg at night Continue to follow with PCP Return as needed.

## 2024-05-09 ENCOUNTER — Emergency Department (HOSPITAL_COMMUNITY)

## 2024-05-09 ENCOUNTER — Encounter (HOSPITAL_COMMUNITY): Payer: Self-pay

## 2024-05-09 ENCOUNTER — Emergency Department (HOSPITAL_COMMUNITY)
Admission: EM | Admit: 2024-05-09 | Discharge: 2024-05-10 | Disposition: A | Discharge status: Home / Self Care | Source: Skilled Nursing Facility | Attending: Emergency Medicine | Admitting: Emergency Medicine

## 2024-05-09 DIAGNOSIS — Z89611 Acquired absence of right leg above knee: Secondary | ICD-10-CM | POA: Insufficient documentation

## 2024-05-09 DIAGNOSIS — W07XXXA Fall from chair, initial encounter: Secondary | ICD-10-CM | POA: Insufficient documentation

## 2024-05-09 DIAGNOSIS — R569 Unspecified convulsions: Secondary | ICD-10-CM | POA: Diagnosis not present

## 2024-05-09 DIAGNOSIS — W050XXA Fall from non-moving wheelchair, initial encounter: Secondary | ICD-10-CM | POA: Insufficient documentation

## 2024-05-09 DIAGNOSIS — Z9889 Other specified postprocedural states: Secondary | ICD-10-CM | POA: Diagnosis not present

## 2024-05-09 DIAGNOSIS — Z7982 Long term (current) use of aspirin: Secondary | ICD-10-CM | POA: Insufficient documentation

## 2024-05-09 DIAGNOSIS — Z87891 Personal history of nicotine dependence: Secondary | ICD-10-CM | POA: Insufficient documentation

## 2024-05-09 DIAGNOSIS — Z79899 Other long term (current) drug therapy: Secondary | ICD-10-CM | POA: Insufficient documentation

## 2024-05-09 DIAGNOSIS — S0990XA Unspecified injury of head, initial encounter: Secondary | ICD-10-CM | POA: Insufficient documentation

## 2024-05-09 DIAGNOSIS — I5022 Chronic systolic (congestive) heart failure: Secondary | ICD-10-CM | POA: Insufficient documentation

## 2024-05-09 DIAGNOSIS — Z7902 Long term (current) use of antithrombotics/antiplatelets: Secondary | ICD-10-CM | POA: Diagnosis not present

## 2024-05-09 DIAGNOSIS — I110 Hypertensive heart disease with heart failure: Secondary | ICD-10-CM | POA: Diagnosis not present

## 2024-05-09 DIAGNOSIS — I10 Essential (primary) hypertension: Secondary | ICD-10-CM | POA: Insufficient documentation

## 2024-05-09 LAB — CBC
HCT: 43.1 % (ref 39.0–52.0)
Hemoglobin: 13.7 g/dL (ref 13.0–17.0)
MCH: 29.5 pg (ref 26.0–34.0)
MCHC: 31.8 g/dL (ref 30.0–36.0)
MCV: 92.9 fL (ref 80.0–100.0)
Platelets: 322 K/uL (ref 150–400)
RBC: 4.64 MIL/uL (ref 4.22–5.81)
RDW: 13.8 % (ref 11.5–15.5)
WBC: 10.8 K/uL — ABNORMAL HIGH (ref 4.0–10.5)
nRBC: 0 % (ref 0.0–0.2)

## 2024-05-09 LAB — COMPREHENSIVE METABOLIC PANEL WITH GFR
ALT: 15 U/L (ref 0–44)
AST: 24 U/L (ref 15–41)
Albumin: 4.3 g/dL (ref 3.5–5.0)
Alkaline Phosphatase: 95 U/L (ref 38–126)
Anion gap: 11 (ref 5–15)
BUN: 18 mg/dL (ref 8–23)
CO2: 25 mmol/L (ref 22–32)
Calcium: 9.3 mg/dL (ref 8.9–10.3)
Chloride: 98 mmol/L (ref 98–111)
Creatinine, Ser: 0.82 mg/dL (ref 0.61–1.24)
GFR, Estimated: >60 mL/min (ref >60)
Glucose, Bld: 79 mg/dL (ref 70–99)
Potassium: 3.8 mmol/L (ref 3.5–5.1)
Sodium: 134 mmol/L — ABNORMAL LOW (ref 135–145)
Total Bilirubin: 0.4 mg/dL (ref 0.0–1.2)
Total Protein: 8.1 g/dL (ref 6.5–8.1)

## 2024-05-09 LAB — I-STAT CHEM 8, ED
BUN: 23 mg/dL (ref 8–23)
Calcium, Ion: 1.11 mmol/L — ABNORMAL LOW (ref 1.15–1.40)
Chloride: 102 mmol/L (ref 98–111)
Creatinine, Ser: 0.9 mg/dL (ref 0.61–1.24)
Glucose, Bld: 78 mg/dL (ref 70–99)
HCT: 43 % (ref 39.0–52.0)
Hemoglobin: 14.6 g/dL (ref 13.0–17.0)
Potassium: 3.9 mmol/L (ref 3.5–5.1)
Sodium: 138 mmol/L (ref 135–145)
TCO2: 27 mmol/L (ref 22–32)

## 2024-05-09 LAB — PROTIME-INR
INR: 1.1 (ref 0.8–1.2)
Prothrombin Time: 14.4 s (ref 11.4–15.2)

## 2024-05-09 LAB — TROPONIN I (HIGH SENSITIVITY)
Troponin I (High Sensitivity): 7 ng/L (ref <18)
Troponin I (High Sensitivity): 5 ng/L (ref <18)

## 2024-05-09 LAB — ETHANOL: Alcohol, Ethyl (B): <15 mg/dL (ref <15)

## 2024-05-09 LAB — I-STAT CG4 LACTIC ACID, ED: Lactic Acid, Venous: 1.2 mmol/L (ref 0.5–1.9)
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
## 2024-05-09 NOTE — Discharge Instructions (Signed)
 It was a pleasure caring for you today in the emergency department.  Based on the events which brought you to the ER today, it is possible that you may have a concussion. A concussion occurs when there is a blow to the head or body, with enough force to shake the brain and disrupt how the brain functions. You may experience symptoms such as headaches, sensitivity to light/noise, dizziness, cognitive slowing, difficulty concentrating / remembering, trouble sleeping and drowsiness. These symptoms may last anywhere from hours/days to potentially weeks/months. While these symptoms are very frustrating and perhaps debilitating, it is important that you remember that they will improve over time. Everyone has a different rate of recovery; it is difficult to predict when your symptoms will resolve. In order to allow for your brain to heal after the injury, we recommend that you see your primary physician or a physician knowledgeable in concussion management. We also advise you to let your body and brain rest: avoid physical activities (sports, gym, and exercise) and reduce cognitive demands (reading, texting, TV watching, computer use, video games, etc). School attendance, after-school activities and work may need to be modified to avoid increasing symptoms. We recommend against driving until until all symptoms have resolved. Come back to the ER right away if you are having repeated episodes of vomiting, severe/worsening headache/dizziness or any other symptom that alarms you. We recommended that someone stay with you for the next 24 hours to monitor for these worrisome symptoms.   Please return to the emergency department for any worsening or worrisome symptoms.

## 2024-05-09 NOTE — ED Triage Notes (Signed)
 Pt BIB [facility] from  [facility] d/t a fall. Unwitnessed wheelchair fall. 6/10 pain. Does not recall the event. Baseline A&O X3. TBI. No noticeable injuries. C/O HA.

## 2024-05-13 ENCOUNTER — Telehealth: Payer: Self-pay | Admitting: Neurology

## 2024-05-13 NOTE — Telephone Encounter (Signed)
 Call to sister, no answer. Left message to call back.

## 2024-05-13 NOTE — Telephone Encounter (Signed)
 Aricept is safe and can help with Memory, It is okay to take with his anti seizure medication. They should start with 5 mg nightly and if able to tolerate, then increase to 10 mg nightly.   Thanks  Dr. Tameya Kuznia

## 2024-05-13 NOTE — Telephone Encounter (Signed)
 Pt sister was in today and would like to speak with nurse/Dr. Gregg regarding meds for her brother. Facility is thinking of starting him on some meds, but want to check with neurologist first to see if risks outweigh benefits. Phone call please, no MYC
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
## 2024-05-13 NOTE — Telephone Encounter (Signed)
 Patient returned phone call,would like a call back.

## 2024-05-13 NOTE — Telephone Encounter (Signed)
 Returned call to sister and reviewed Dr. Gregg recommendations. She verbalized understanding and the this was not ordered or managed by Dr. Gregg, only his input and guidance

## 2024-06-18 ENCOUNTER — Other Ambulatory Visit: Payer: Self-pay

## 2024-06-18 ENCOUNTER — Emergency Department (HOSPITAL_COMMUNITY)
Admission: EM | Admit: 2024-06-18 | Discharge: 2024-06-18 | Disposition: A | Discharge status: Home / Self Care | Attending: Emergency Medicine | Admitting: Emergency Medicine

## 2024-06-18 ENCOUNTER — Encounter (HOSPITAL_COMMUNITY): Payer: Self-pay | Admitting: Emergency Medicine

## 2024-06-18 DIAGNOSIS — R300 Dysuria: Secondary | ICD-10-CM | POA: Diagnosis present

## 2024-06-18 DIAGNOSIS — N3000 Acute cystitis without hematuria: Secondary | ICD-10-CM | POA: Diagnosis not present

## 2024-06-18 LAB — CBC WITH DIFFERENTIAL/PLATELET
Abs Immature Granulocytes: 0.02 K/uL (ref 0.00–0.07)
Basophils Absolute: 0 K/uL (ref 0.0–0.1)
Basophils Relative: 1 %
Eosinophils Absolute: 0.5 K/uL (ref 0.0–0.5)
Eosinophils Relative: 7 %
HCT: 32.1 % — ABNORMAL LOW (ref 39.0–52.0)
Hemoglobin: 10.5 g/dL — ABNORMAL LOW (ref 13.0–17.0)
Immature Granulocytes: 0 %
Lymphocytes Relative: 44 %
Lymphs Abs: 2.9 K/uL (ref 0.7–4.0)
MCH: 29.8 pg (ref 26.0–34.0)
MCHC: 32.7 g/dL (ref 30.0–36.0)
MCV: 91.2 fL (ref 80.0–100.0)
Monocytes Absolute: 0.8 K/uL (ref 0.1–1.0)
Monocytes Relative: 12 %
Neutro Abs: 2.4 K/uL (ref 1.7–7.7)
Neutrophils Relative %: 36 %
Platelets: 213 K/uL (ref 150–400)
RBC: 3.52 MIL/uL — ABNORMAL LOW (ref 4.22–5.81)
RDW: 13 % (ref 11.5–15.5)
WBC: 6.6 K/uL (ref 4.0–10.5)
nRBC: 0 % (ref 0.0–0.2)

## 2024-06-18 LAB — URINALYSIS, ROUTINE W REFLEX MICROSCOPIC
Glucose, UA: 250 mg/dL — AB
Ketones, ur: 15 mg/dL — AB
Nitrite: POSITIVE — AB
Protein, ur: >300 mg/dL — AB
Specific Gravity, Urine: 1.015 (ref 1.005–1.030)
pH: 7 (ref 5.0–8.0)

## 2024-06-18 LAB — URINALYSIS, MICROSCOPIC (REFLEX)
Bacteria, UA: NONE SEEN
RBC / HPF: >50 RBC/hpf (ref 0–5)

## 2024-06-18 LAB — BASIC METABOLIC PANEL WITH GFR
Anion gap: 8 (ref 5–15)
BUN: 22 mg/dL (ref 8–23)
CO2: 26 mmol/L (ref 22–32)
Calcium: 9.1 mg/dL (ref 8.9–10.3)
Chloride: 102 mmol/L (ref 98–111)
Creatinine, Ser: 0.76 mg/dL (ref 0.61–1.24)
GFR, Estimated: >60 mL/min
Glucose, Bld: 86 mg/dL (ref 70–99)
Potassium: 4.3 mmol/L (ref 3.5–5.1)
Sodium: 136 mmol/L (ref 135–145)

## 2024-06-18 MED ORDER — SODIUM CHLORIDE 0.9 % IV SOLN
1.0000 g | Freq: Once | INTRAVENOUS | Status: AC
  Administered 2024-06-18: 1 g via INTRAVENOUS
  Filled 2024-06-18: qty 10

## 2024-06-18 MED ORDER — LACTATED RINGERS IV BOLUS
1000.0000 mL | Freq: Once | INTRAVENOUS | Status: AC
  Administered 2024-06-18: 1000 mL via INTRAVENOUS

## 2024-06-18 MED ORDER — CEFPODOXIME PROXETIL 200 MG PO TABS: 200.0000 mg | ORAL_TABLET | Freq: Two times a day (BID) | ORAL | 0 refills | Status: AC

## 2024-06-18 NOTE — ED Triage Notes (Signed)
 BIB [facility] from [facility] for hematuria without pain, burning, frequency or any other complaint. Pt does take plavix .   BP 140/80 HR 83 Resp 21 Spo2 98%  Temp 98.2 CBG 101

## 2024-06-18 NOTE — ED Provider Notes (Signed)
 " [facility] Provider Note   CSN: [id number] Arrival date & time: 06/18/24  0002     History Chief Complaint  Patient presents with   Hematuria    HPI [name] is a 68 y.o. male presenting for urinary discomfort, increased urinary frequency and decreased p.o. intake.  Comes in with family ember with the above complaints. Family were states that he has a history of recurrent urinary tract infections and used to follow with urology secondary to BPH causing mild urinary retention.  Today patient is endorsing some increased frequency and dysuria but mostly discoloration with possible hematuria.  Denies systemic fevers chills nausea vomiting syncope or shortness of breath..   Patient's recorded medical, surgical, social, medication list and allergies were reviewed in the Snapshot window as part of the initial history.   Review of Systems   Review of Systems  Constitutional:  Negative for chills and fever.  HENT:  Negative for ear pain and sore throat.   Eyes:  Negative for pain and visual disturbance.  Respiratory:  Negative for cough and shortness of breath.   Cardiovascular:  Negative for chest pain and palpitations.  Gastrointestinal:  Negative for abdominal pain and vomiting.  Genitourinary:  Positive for dysuria, frequency and urgency. Negative for hematuria.  Musculoskeletal:  Negative for arthralgias and back pain.  Skin:  Negative for color change and rash.  Neurological:  Negative for seizures and syncope.  All other systems reviewed and are negative.   Physical Exam Updated Vital Signs BP 139/82   Pulse 73   Temp 98.2 F (36.8 C)   Resp 17   Ht 5' 5 (1.651 m)   Wt 35.8 kg   SpO2 100%   BMI 13.13 kg/m  Physical Exam Vitals and nursing note reviewed.  Constitutional:      General: He is not in acute distress.    Appearance: He is well-developed.  HENT:     Head: Normocephalic and atraumatic.  Eyes:      Conjunctiva/sclera: Conjunctivae normal.  Cardiovascular:     Rate and Rhythm: Normal rate and regular rhythm.     Heart sounds: No murmur heard. Pulmonary:     Effort: Pulmonary effort is normal. No respiratory distress.     Breath sounds: Normal breath sounds.  Abdominal:     Palpations: Abdomen is soft.     Tenderness: There is no abdominal tenderness.  Musculoskeletal:        General: No swelling.     Cervical back: Neck supple.  Skin:    General: Skin is warm and dry.     Capillary Refill: Capillary refill takes less than 2 seconds.  Neurological:     Mental Status: He is alert.  Psychiatric:        Mood and Affect: Mood normal.      ED Course/ Medical Decision Making/ A&P    Procedures Procedures   Medications Ordered in ED Medications  lactated ringers  bolus 1,000 mL (0 mLs Intravenous Stopped 06/18/24 0425)  cefTRIAXone  (ROCEPHIN ) 1 g in sodium chloride  0.9 % 100 mL IVPB (0 g Intravenous Stopped 06/18/24 9362)   Medical Decision Making:   [name] is a 68 y.o. male who presented to the ED today with altered mental status detailed above.    Patient placed on continuous vitals and telemetry monitoring while in ED which was reviewed periodically.  Complete initial physical exam performed, notably the patient  was HDS in NAD.  Reviewed and confirmed nursing documentation for past medical history, family history, social history.    Initial Assessment:   With the patient's presentation of altered mental status, most likely diagnosis is delerium 2/2 infectious etiology (UTI/CAP/URI) vs metabolic abnormality (Na/K/Mg/Ca) vs nonspecific etiology. Other diagnoses were considered including (but not limited to) CVA, ICH, intracranial mass, critical dehydration, heptatic dysfunction, uremia, hypercarbia, intoxication, endrocrine abnormality, toxidrome. These are considered less likely due to history of present illness and physical exam findings.   This is most  consistent with an acute life/limb threatening illness complicated by underlying chronic conditions.  Initial Plan:  CTH to evaluate for intracranial etiology of patient's symptoms  Screening labs including CBC and Metabolic panel to evaluate for infectious or metabolic etiology of disease.  Urinalysis with reflex culture ordered to evaluate for UTI or relevant urologic/nephrologic pathology.  Objective evaluation as below reviewed   Initial Study Results:   Laboratory  All laboratory results reviewed without evidence of clinically relevant pathology.     Final Assessment and Plan:   Lab work reveals likely substantial UTI. Discussed with family.  They would rather him be managed in the outpatient setting given the discomfort of hospitalization.  Patient requesting discharge as well. Will treat with Rocephin  given at a holiday to cover for 24 hours and transition to oral third-generation cephalosporin given history of drug resistance.  Disposition:  I have considered need for hospitalization, however, considering all of the above, I believe this patient is stable for discharge at this time.  Patient/family educated about specific return precautions for given chief complaint and symptoms.  Patient/family educated about follow-up with PCP.     Patient/family expressed understanding of return precautions and need for follow-up. Patient spoken to regarding all imaging and laboratory results and appropriate follow up for these results. All education provided in verbal form with additional information in written form. Time was allowed for answering of patient questions. Patient discharged.    Emergency Department Medication Summary:   Medications  lactated ringers  bolus 1,000 mL (0 mLs Intravenous Stopped 06/18/24 0425)  cefTRIAXone  (ROCEPHIN ) 1 g in sodium chloride  0.9 % 100 mL IVPB (0 g Intravenous Stopped 06/18/24 0637)      Clinical Impression:  1. Acute cystitis without hematuria       Discharge   Final Clinical Impression(s) / ED Diagnoses Final diagnoses:  Acute cystitis without hematuria    Rx / DC Orders ED Discharge Orders          Ordered    cefpodoxime  (VANTIN ) 200 MG tablet  2 times daily        06/18/24 0528              Jerral Meth, MD 06/18/24 2334  "

## 2024-06-19 LAB — URINE CULTURE: Culture: NO GROWTH

## 2024-07-13 ENCOUNTER — Other Ambulatory Visit (HOSPITAL_COMMUNITY): Payer: Self-pay | Admitting: Urology

## 2024-07-13 DIAGNOSIS — R310 Gross hematuria: Secondary | ICD-10-CM

## 2024-07-29 ENCOUNTER — Ambulatory Visit (HOSPITAL_COMMUNITY)
Admission: RE | Admit: 2024-07-29 | Discharge: 2024-07-29 | Disposition: A | Source: Ambulatory Visit | Attending: Urology

## 2024-07-29 DIAGNOSIS — R310 Gross hematuria: Secondary | ICD-10-CM

## 2024-07-29 DIAGNOSIS — N179 Acute kidney failure, unspecified: Secondary | ICD-10-CM

## 2024-07-29 LAB — POCT I-STAT CREATININE: Creatinine, Ser: 0.9 mg/dL (ref 0.61–1.24)

## 2024-07-29 MED ORDER — IOHEXOL 300 MG/ML  SOLN
75.0000 mL | Freq: Once | INTRAMUSCULAR | Status: AC | PRN
  Administered 2024-07-29: 75 mL via INTRAVENOUS
